# Patient Record
Sex: Male | Born: 1960 | Race: White | Hispanic: No | Smoking: Light tobacco smoker
Health system: Southern US, Community
[De-identification: ages and names within clinical notes are randomized; demographics above are authoritative.]

## PROBLEM LIST (undated history)

## (undated) DIAGNOSIS — E876 Hypokalemia: Secondary | ICD-10-CM

## (undated) DIAGNOSIS — K2270 Barrett's esophagus without dysplasia: Secondary | ICD-10-CM

## (undated) DIAGNOSIS — K2210 Ulcer of esophagus without bleeding: Secondary | ICD-10-CM

## (undated) DIAGNOSIS — E119 Type 2 diabetes mellitus without complications: Secondary | ICD-10-CM

## (undated) DIAGNOSIS — I10 Essential (primary) hypertension: Secondary | ICD-10-CM

## (undated) DIAGNOSIS — I82409 Acute embolism and thrombosis of unspecified deep veins of unspecified lower extremity: Secondary | ICD-10-CM

## (undated) DIAGNOSIS — F418 Other specified anxiety disorders: Secondary | ICD-10-CM

## (undated) DIAGNOSIS — J449 Chronic obstructive pulmonary disease, unspecified: Secondary | ICD-10-CM

## (undated) DIAGNOSIS — K219 Gastro-esophageal reflux disease without esophagitis: Secondary | ICD-10-CM

## (undated) DIAGNOSIS — M1990 Unspecified osteoarthritis, unspecified site: Secondary | ICD-10-CM

## (undated) DIAGNOSIS — F1910 Other psychoactive substance abuse, uncomplicated: Secondary | ICD-10-CM

## (undated) DIAGNOSIS — K449 Diaphragmatic hernia without obstruction or gangrene: Secondary | ICD-10-CM

## (undated) DIAGNOSIS — E871 Hypo-osmolality and hyponatremia: Secondary | ICD-10-CM

## (undated) DIAGNOSIS — G4730 Sleep apnea, unspecified: Secondary | ICD-10-CM

## (undated) DIAGNOSIS — J45909 Unspecified asthma, uncomplicated: Secondary | ICD-10-CM

## (undated) DIAGNOSIS — R569 Unspecified convulsions: Secondary | ICD-10-CM

## (undated) DIAGNOSIS — K635 Polyp of colon: Secondary | ICD-10-CM

## (undated) DIAGNOSIS — D500 Iron deficiency anemia secondary to blood loss (chronic): Secondary | ICD-10-CM

## (undated) HISTORY — PX: HIP SURGERY: SHX245

## (undated) HISTORY — PX: RHINOPLASTY: SUR1284

## (undated) HISTORY — DX: Hypokalemia: E87.6

## (undated) HISTORY — DX: Unspecified asthma, uncomplicated: J45.909

## (undated) HISTORY — PX: BACK SURGERY: SHX140

## (undated) HISTORY — DX: Sleep apnea, unspecified: G47.30

## (undated) HISTORY — DX: Acute embolism and thrombosis of unspecified deep veins of unspecified lower extremity: I82.409

## (undated) HISTORY — PX: BUNIONECTOMY: SHX129

## (undated) HISTORY — PX: NASAL SINUS SURGERY: SHX719

## (undated) HISTORY — DX: Polyp of colon: K63.5

## (undated) HISTORY — PX: FINGER SURGERY: SHX640

## (undated) HISTORY — DX: Gastro-esophageal reflux disease without esophagitis: K21.9

## (undated) HISTORY — PX: OTHER SURGICAL HISTORY: SHX169

## (undated) HISTORY — DX: Unspecified convulsions: R56.9

## (undated) HISTORY — PX: APPENDECTOMY: SHX54

## (undated) HISTORY — DX: Hypo-osmolality and hyponatremia: E87.1

---

## 2004-01-08 ENCOUNTER — Encounter: Admission: RE | Admit: 2004-01-08 | Discharge: 2004-02-07 | Payer: Self-pay | Admitting: Family Medicine

## 2004-02-09 ENCOUNTER — Ambulatory Visit (HOSPITAL_COMMUNITY): Admission: RE | Admit: 2004-02-09 | Discharge: 2004-02-10 | Payer: Self-pay | Admitting: Neurological Surgery

## 2004-03-17 ENCOUNTER — Encounter: Admission: RE | Admit: 2004-03-17 | Discharge: 2004-04-24 | Payer: Self-pay | Admitting: Neurological Surgery

## 2005-03-05 ENCOUNTER — Emergency Department (HOSPITAL_COMMUNITY): Admission: EM | Admit: 2005-03-05 | Discharge: 2005-03-05 | Payer: Self-pay | Admitting: Emergency Medicine

## 2006-04-18 ENCOUNTER — Observation Stay (HOSPITAL_COMMUNITY): Admission: EM | Admit: 2006-04-18 | Discharge: 2006-04-18 | Payer: Self-pay | Admitting: Emergency Medicine

## 2008-05-07 ENCOUNTER — Emergency Department (HOSPITAL_COMMUNITY): Admission: EM | Admit: 2008-05-07 | Discharge: 2008-05-07 | Payer: Self-pay | Admitting: Emergency Medicine

## 2009-05-06 ENCOUNTER — Encounter (INDEPENDENT_AMBULATORY_CARE_PROVIDER_SITE_OTHER): Payer: Self-pay | Admitting: *Deleted

## 2009-05-06 ENCOUNTER — Ambulatory Visit: Payer: Self-pay | Admitting: Internal Medicine

## 2009-05-06 ENCOUNTER — Inpatient Hospital Stay (HOSPITAL_COMMUNITY): Admission: EM | Admit: 2009-05-06 | Discharge: 2009-05-09 | Payer: Self-pay | Admitting: Emergency Medicine

## 2009-05-07 ENCOUNTER — Encounter (INDEPENDENT_AMBULATORY_CARE_PROVIDER_SITE_OTHER): Payer: Self-pay | Admitting: *Deleted

## 2009-06-05 DIAGNOSIS — K2270 Barrett's esophagus without dysplasia: Secondary | ICD-10-CM

## 2009-06-05 HISTORY — DX: Barrett's esophagus without dysplasia: K22.70

## 2009-06-11 ENCOUNTER — Ambulatory Visit: Payer: Self-pay | Admitting: Internal Medicine

## 2009-06-11 DIAGNOSIS — R933 Abnormal findings on diagnostic imaging of other parts of digestive tract: Secondary | ICD-10-CM

## 2009-06-11 DIAGNOSIS — K219 Gastro-esophageal reflux disease without esophagitis: Secondary | ICD-10-CM

## 2009-06-11 DIAGNOSIS — K625 Hemorrhage of anus and rectum: Secondary | ICD-10-CM

## 2009-06-14 ENCOUNTER — Telehealth: Payer: Self-pay | Admitting: Internal Medicine

## 2009-07-04 ENCOUNTER — Ambulatory Visit: Payer: Self-pay | Admitting: Internal Medicine

## 2009-07-10 ENCOUNTER — Encounter: Payer: Self-pay | Admitting: Internal Medicine

## 2009-09-10 ENCOUNTER — Telehealth: Payer: Self-pay | Admitting: Internal Medicine

## 2010-02-04 NOTE — Assessment & Plan Note (Signed)
Summary: Post hospital - esophageal issues   History of Present Illness Visit Type: Initial Visit Primary GI MD: Yancey Flemings MD Primary Provider: Antony Haste, MD Chief Complaint: Post hospital/ GERD, symptoms have improved History of Present Illness:   50 year old white male with a history of hypertension, asthma, alcohol and tobacco abuse, and anxiety. He was recently hospitalized (May 2 through May 09, 2009) with pneumonia as well as severe GERD symptoms. Problems with nausea vomiting and chest pain at the time of admission. Evaluation of the esophagus with CT scan and subsequent esophagram demonstrated esophagitis and probable confined submucosal tear. Was treated with antibiotics for his pneumonia as well as Carafate and Protonix for his GERD. He has changed his diet in a favorable fashion dramatically. His last 17 pounds. He had been off alcohol for several weeks though is using at modestly at present. He reports no problems with reflux on current medical therapy. No further chest pain. His dysphagia has improved. He has had reflux for over 10 years. As mentioned his bowel habits have been a bit looser than normal. Occasional rectal bleeding felt to be hemorrhoidal. No prior GI history or GI evaluations. He is establishing his general medical care with Dr. Cyndia Bent. Review of outside labs dated May 01, 2009 reveal normal CBC with hemoglobin 16.8 as well as normal comprehensive metabolic panel.Marland Kitchen   GI Review of Systems    Reports abdominal pain, acid reflux, chest pain, dysphagia with solids, loss of appetite, nausea, and  vomiting.     Location of  Abdominal pain: epigastric area.    Denies belching, bloating, dysphagia with liquids, heartburn, vomiting blood, weight loss, and  weight gain.      Reports change in bowel habits, constipation, hemorrhoids, light color stool, and  rectal bleeding.     Denies anal fissure, black tarry stools, diarrhea, diverticulosis, fecal incontinence, heme  positive stool, irritable bowel syndrome, jaundice, liver problems, and  rectal pain. Preventive Screening-Counseling & Management  Alcohol-Tobacco     Smoking Status: current      Drug Use:  no.      Current Medications (verified): 1)  Carafate 1 Gm Tabs (Sucralfate) .Marland Kitchen.. 1 By Mouth Four Times Per Day 2)  Celexa 20 Mg Tabs (Citalopram Hydrobromide) .Marland Kitchen.. 1 By Mouth Two Times A Day 3)  Coenzyme Q10 10 Mg Caps (Coenzyme Q10) .Marland Kitchen.. 1 By Mouth Once Daily 4)  Klonopin 0.5 Mg Tabs (Clonazepam) .Marland Kitchen.. 1-2 By Mouth Two Times A Day As Needed Anxiety 5)  Protonix 40 Mg Tbec (Pantoprazole Sodium) .Marland Kitchen.. 1 By Mouth Two Times A Day  Allergies (verified): No Known Drug Allergies  Past History:  Past Medical History: Tear of the distal Esophagus GERD Anxiety Alcohol abuse Tobacco abuse Chronic low back pain DVT Chronic Headaches Hypertension  Past Surgical History: Reviewed history from 06/07/2009 and no changes required. Appendectomy Sinus Surgery Diskectomy Lt. middle finger surgery  Family History: No FH of Colon Cancer: Family History of Diabetes: MGF  Social History: Occupation: Counsellor Patient currently smokes.  Alcohol Use - yes Daily Caffeine Use Illicit Drug Use - no Smoking Status:  current Drug Use:  no  Review of Systems       The patient complains of anxiety-new, back pain, change in vision, cough, coughing up blood, depression-new, fatigue, headaches-new, muscle pains/cramps, shortness of breath, sleeping problems, and urination - excessive.  The patient denies allergy/sinus, anemia, arthritis/joint pain, blood in urine, breast changes/lumps, confusion, fainting, fever, hearing problems, heart murmur, heart rhythm changes, itching,  menstrual pain, night sweats, nosebleeds, pregnancy symptoms, skin rash, sore throat, swelling of feet/legs, swollen lymph glands, thirst - excessive, urination changes/pain, urine leakage, vision changes, and voice change.    Vital  Signs:  Patient profile:   50 year old male Height:      75 inches Weight:      230.38 pounds BMI:     28.90 Pulse rate:   96 / minute Pulse rhythm:   regular BP sitting:   158 / 100  (left arm) Cuff size:   regular  Vitals Entered By: June McMurray CMA Duncan Dull) (June 11, 2009 9:26 AM)  Physical Exam  General:  Well developed, well nourished, no acute distress. Head:  Normocephalic and atraumatic. Eyes:  PERRLA, no icterus. Nose:  No deformity, discharge,  or lesions. Mouth:  No deformity or lesions,  Neck:  Supple; no masses or thyromegaly. Lungs:  Clear throughout to auscultation. Heart:  Regular rate and rhythm; no murmurs, rubs,  or bruits. Abdomen:  Soft, nontender and nondistended. No masses, hepatosplenomegaly or hernias noted. Normal bowel sounds. Msk:  Symmetrical with no gross deformities. Normal posture. Pulses:  Normal pulses noted. Extremities:  No clubbing, cyanosis, edema or deformities noted. Neurologic:  Alert and  oriented x4;  grossly normal neurologically. Skin:  Intact without significant lesions or rashes. Psych:  Alert and cooperative. Normal mood and affect.   Impression & Recommendations:  Problem # 1:  GERD (ICD-530.81) many year history of chronic GERD. Significant improvement of symptoms on PPI therapy. Also history of dysphagia intermittently, now better on PPI. Rule out Barrett's esophagus in this overweight white male who smokes and uses alcohol and has had reflux for greater than 10 years. Rule out peptic stricture.   Plan: #1. Stop Carafate #2. Continue Protonix b.i.d. for now #3. Schedule upper endoscopy. The nature of the procedure as well as the risks, benefits, and alternatives have been reviewed. He understood and agreed to proceed  Problem # 2:  ABNORMAL FINDINGS GI TRACT (ICD-793.4) recent CT and esophagram suggesting esophagitis as well as contain submucosal tear. Has had clinical resolution of GERD symptoms and chest pain. Appropriate  time to evaluate with endoscopy. Plans as outlined above.  Problem # 3:  RECTAL BLEEDING (ICD-569.3)  minor intermittent rectal bleeding likely due to benign anorectal pathology. Rule out neoplasia  Problem # 4:  SCREENING COLORECTAL-CANCER (ICD-V76.51) patient is of appropriate age for screening colonoscopy. After the esophageal issues are sorted out, we will address this. I have discussed this with him today. In addition to providing colorectal neoplasia screening, we can evaluate the minor intermittent rectal bleeding just to be safe  Patient Instructions: 1)  EGD [facility] 07/04/09 3:30 pm 2)  Upper Endoscopy brochure given.  3)  The medication list was reviewed and reconciled.  All changed / newly prescribed medications were explained.  A complete medication list was provided to the patient / caregiver. 4)  printed and given to pt. Milford Cage [facility]  June 11, 2009 10:15 AM 5)  Copy: Dr. Antony Haste

## 2010-02-04 NOTE — Letter (Signed)
Summary: Patient Notice-Barrett's [facility]  [address] [place], [place] [address]   Phone: [phone]  Fax: [phone]        July 10, 2009 MRN: [id number]    Jesse Hartman [facility] [address] [place], [place]  [address]    Dear Jesse Hartman,  I am pleased to inform you that the biopsies taken during your recent endoscopic examination did not show any evidence of cancer upon pathologic examination.  However, your biopsies indicate you have a condition known as Barrett's esophagus. While not cancer, it is pre-cancerous (can progress to cancer) and needs to be monitored with repeat endoscopic examination and biopsies.  Fortunately, it is quite rare that this develops into cancer, but careful monitoring of the condition along with taking your medication as prescribed is important in reducing the risk of developing cancer.  It is my recommendation that you have a repeat upper gastrointestinal endoscopic examination in ONE year.  Additional information/recommendations:  __Please call [phone] to schedule a return visit to further      evaluate your condition.  __Continue with treatment plan as outlined the day of your exam on your procedure report.  Please call us if you have or develop heartburn, reflux symptoms, any swallowing problems, or if you have questions about your condition that have not been fully answered at this time.  Sincerely,  Hilarie Fredrickson MD  This letter has been electronically signed by your physician.  Appended Document: Patient Notice-Barrett's Esopghagus letter mailed

## 2010-02-04 NOTE — Progress Notes (Signed)
Summary: refills  Phone Note Call from Patient Call back at Home Phone [phone]   Caller: Patient Call For: Marina Goodell Reason for Call: Refill Medication, Talk to Nurse Summary of Call: Patient needs refills for his Pantoprazole [facility] [address] Initial call taken by: Tawni Levy,  June 14, 2009 1:52 PM    Prescriptions: PROTONIX 40 MG TBEC (PANTOPRAZOLE SODIUM) 1 by mouth two times a day  #60 x 11   Entered by:   Milford Cage NCMA   Authorized by:   Hilarie Fredrickson MD   Signed by:   Milford Cage NCMA on 06/14/2009   Method used:   Electronically to        [id number] (retail)       [address]       [place], [place]  [address]       Ph: [phone] or [phone]       Fax: [phone]   RxID:   [id number]

## 2010-02-04 NOTE — Progress Notes (Signed)
Summary: Med Change  Phone Note Call from Patient Call back at Home Phone [phone]   Caller: Patient Call For: Dr Marina Goodell Reason for Call: Talk to Nurse Details for Reason: Med Change Summary of Call: Pt lost healthcare insurance and cannot afford Protonix. Pharmacist recommended Omeprazole OTC, pt has been taking 20.mg two times a day and wanted to check with Dr Marina Goodell if this was an ok subsitution. States he is asymptomatic at this time. Initial call taken by: Dwan Bolt,  September 10, 2009 3:15 PM  Follow-up for Phone Call        Pt. told ok to take Prilosec .Says he is between jobs and he is asymptomatic on this med. Follow-up by: Teryl Lucy RN,  September 10, 2009 3:43 PM  Additional Follow-up for Phone Call Additional follow up Details #1::        ok, but must stay on PPI daily given Barrett's Additional Follow-up by: Hilarie Fredrickson MD,  September 10, 2009 5:45 PM    Additional Follow-up for Phone Call Additional follow up Details #2::    Pt. contacted and he will remain on P.P.I. Follow-up by: Teryl Lucy RN,  September 11, 2009 10:18 AM

## 2010-02-04 NOTE — Procedures (Signed)
Summary: Upper Endoscopy  Patient: Jesse Hartman Note: All result statuses are Final unless otherwise noted.  Tests: (1) Upper Endoscopy (EGD)   EGD Upper Endoscopy       DONE     [facility]     [address]     [place], [place]  [address]           ENDOSCOPY PROCEDURE REPORT           PATIENT:  Jesse Hartman  MR#:  [id number]     BIRTHDATE:  1960-04-28, 49 yrs. old  GENDER:  male           ENDOSCOPIST:  Wilhemina Bonito. Eda Keys, MD     Referred by:  Office           PROCEDURE DATE:  07/04/2009     PROCEDURE:  EGD with biopsies     ASA CLASS:  Class II     INDICATIONS:  abnormal imaging, Chronic GERD           MEDICATIONS:   Fentanyl 100 mcg IV, Versed 10 mg IV, Benadryl 50     mg IV     TOPICAL ANESTHETIC:  Exactacain Spray           DESCRIPTION OF PROCEDURE:   After the risks benefits and     alternatives of the procedure were thoroughly explained, informed     consent was obtained.  The LB GIF-H180 [id number] endoscope was     introduced through the mouth and advanced to the second portion of     the duodenum, without limitations.  The instrument was slowly     withdrawn as the mucosa was fully examined.     <<PROCEDUREIMAGES>>           10cm segment of Barrett's esophagus was found in the distal     esophagus. Biopsies (4q q 2cm; T=20) taken.  The stomach was     entered and closely examined. The antrum, angularis, and lesser     curvature were well visualized, including a retroflexed view of     the cardia and fundus. The stomach wall was normally distensable.     The scope passed easily through the pylorus into the duodenum.     Nonerosive Duodenitis was found in the bulb of the duodenum. The     postbulbar duodenum was normal.   Retroflexed views revealed no     abnormalities.    The scope was then withdrawn from the patient     and the procedure completed.           COMPLICATIONS:  None           ENDOSCOPIC IMPRESSION:     1) Barrett's esophagus in the distal  esophagus     2) Normal stomach     3) Duodenitis in the bulb of duodenum     4) GERD           RECOMMENDATIONS:     1) Await biopsy results     2) REPEAT SURVEILLANCE EGD IN ONE YEAR IF NO DYSPLASIA     3) Continue PROTONIX TWICE DAILY     4) Call office next 2-3 days to schedule an office appointment     for 4-6 WEEKS           ______________________________     Wilhemina Bonito. Eda Keys, MD           CC:  The Patient, Gaetano Net  n.     eSIGNED:   Wilhemina Bonito. Eda Keys at 07/04/2009 04:11 PM           Candis Schatz, [id number]  Note: An exclamation mark (!) indicates a result that was not dispersed into the flowsheet. Document Creation Date: 07/04/2009 4:12 PM _______________________________________________________________________  (1) Order result status: Final Collection or observation date-time: 07/04/2009 16:02 Requested date-time:  Receipt date-time:  Reported date-time:  Referring Physician:   Ordering Physician: Fransico Setters [phone]) Specimen Source:  Source: Launa Grill Order Number: [id number] Lab site:   Appended Document: Upper Endoscopy recall ONE year/Barrett's     Procedures Next Due Date:    EGD: 06/2010

## 2010-02-04 NOTE — Letter (Signed)
Summary: New Patient letter  [facility]  [address] [place], [place] [address]   Phone: [phone]  Fax: [phone]       05/07/2009 MRN: [id number]  [facility] [address] [place], [place]  [address]  Dear Jesse Hartman,  Welcome to the Gastroenterology Division at [facility].    You are scheduled to see Dr.  Marina Goodell on 06-11-09 at 9:30a.m. on the 3rd floor at [facility], [address]  We ask that you try to arrive at our office 15 minutes prior to your appointment time to allow for check-in.  We would like you to complete the enclosed self-administered evaluation form prior to your visit and bring it with you on the day of your appointment.  We will review it with you.  Also, please bring a complete list of all your medications or, if you prefer, bring the medication bottles and we will list them.  Please bring your insurance card so that we may make a copy of it.  If your insurance requires a referral to see a specialist, please bring your referral form from your primary care physician.  Co-payments are due at the time of your visit and may be paid by cash, check or credit card.     Your office visit will consist of a consult with your physician (includes a physical exam), any laboratory testing he/she may order, scheduling of any necessary diagnostic testing (e.g. x-ray, ultrasound, CT-scan), and scheduling of a procedure (e.g. Endoscopy, Colonoscopy) if required.  Please allow enough time on your schedule to allow for any/all of these possibilities.    If you cannot keep your appointment, please call [phone] to cancel or reschedule prior to your appointment date.  This allows [place] the opportunity to schedule an appointment for another patient in need of care.  If you do not cancel or reschedule by 5 p.m. the business day prior to your appointment date, you will be charged a $50.00 late cancellation/no-show fee.    Thank you for choosing [facility] for your medical needs.  We appreciate the opportunity to care for you.  Please visit [place] at our website  to learn more about our practice.                     Sincerely,                                                             The Gastroenterology Division

## 2010-02-04 NOTE — Letter (Signed)
Summary: EGD Instructions  [facility]  [address] [place], [place] [address]   Phone: [phone]  Fax: [phone]       [facility]    06/17/1960    MRN: [id number]       Procedure Day /Date:THURSDAY, 07/04/09     Arrival Time: 2:30 PM     Procedure Time:3:30 PM     Location of Procedure:                    [facility] (4th Floor)   PREPARATION FOR ENDOSCOPY   On THURSDAY, 07/04/09 THE DAY OF THE PROCEDURE:  1.   No solid foods, milk or milk products are allowed after midnight the night before your procedure.  2.   Do not drink anything colored red or purple.  Avoid juices with pulp.  No orange juice.  3.  You may drink clear liquids until 1:30 PM, which is 2 hours before your procedure.                                                                                                CLEAR LIQUIDS INCLUDE: Water Jello Ice Popsicles Tea (sugar ok, no milk/cream) Powdered fruit flavored drinks Coffee (sugar ok, no milk/cream) Gatorade Juice: apple, white grape, white cranberry  Lemonade Clear bullion, consomm, broth Carbonated beverages (any kind) Strained chicken noodle soup Hard Candy   MEDICATION INSTRUCTIONS  Unless otherwise instructed, you should take regular prescription medications with a small sip of water as early as possible the morning of your procedure.           OTHER INSTRUCTIONS  You will need a responsible adult at least 49 years of age to accompany you and drive you home.   This person must remain in the waiting room during your procedure.  Wear loose fitting clothing that is easily removed.  Leave jewelry and other valuables at home.  However, you may wish to bring a book to read or an iPod/MP3 player to listen to music as you wait for your procedure to start.  Remove all body piercing jewelry and leave at home.  Total time from sign-in until discharge is approximately 2-3 hours.  You should go home  directly after your procedure and rest.  You can resume normal activities the day after your procedure.  The day of your procedure you should not:   Drive   Make legal decisions   Operate machinery   Drink alcohol   Return to work  You will receive specific instructions about eating, activities and medications before you leave.    The above instructions have been reviewed and explained to me by   _______________________    I fully understand and can verbalize these instructions _____________________________ Date _________

## 2010-03-25 LAB — CULTURE, BLOOD (ROUTINE X 2): Culture: NO GROWTH

## 2010-03-25 LAB — CBC
HCT: 44 % (ref 39.0–52.0)
Hemoglobin: 15.3 g/dL (ref 13.0–17.0)
MCHC: 34.9 g/dL (ref 30.0–36.0)
Platelets: 142 10*3/uL — ABNORMAL LOW (ref 150–400)
RBC: 4.11 MIL/uL — ABNORMAL LOW (ref 4.22–5.81)
RBC: 4.4 MIL/uL (ref 4.22–5.81)
RDW: 12.3 % (ref 11.5–15.5)
WBC: 11.4 10*3/uL — ABNORMAL HIGH (ref 4.0–10.5)
WBC: 9.4 10*3/uL (ref 4.0–10.5)

## 2010-03-25 LAB — DIFFERENTIAL
Basophils Absolute: 0 10*3/uL (ref 0.0–0.1)
Eosinophils Relative: 1 % (ref 0–5)
Lymphocytes Relative: 12 % (ref 12–46)
Lymphs Abs: 1.4 10*3/uL (ref 0.7–4.0)
Neutrophils Relative %: 80 % — ABNORMAL HIGH (ref 43–77)

## 2010-03-25 LAB — URINALYSIS, ROUTINE W REFLEX MICROSCOPIC
Bilirubin Urine: NEGATIVE
Glucose, UA: NEGATIVE mg/dL
Hgb urine dipstick: NEGATIVE
Ketones, ur: 15 mg/dL — AB
Leukocytes, UA: NEGATIVE
pH: 5.5 (ref 5.0–8.0)

## 2010-03-25 LAB — RAPID URINE DRUG SCREEN, HOSP PERFORMED
Benzodiazepines: POSITIVE — AB
Cocaine: NOT DETECTED

## 2010-03-25 LAB — COMPREHENSIVE METABOLIC PANEL
ALT: 31 U/L (ref 0–53)
AST: 33 U/L (ref 0–37)
Albumin: 3.8 g/dL (ref 3.5–5.2)
Calcium: 8.7 mg/dL (ref 8.4–10.5)
Chloride: 98 mEq/L (ref 96–112)
Creatinine, Ser: 0.8 mg/dL (ref 0.4–1.5)
GFR calc non Af Amer: >60 mL/min (ref >60)
Potassium: 3.6 mEq/L (ref 3.5–5.1)

## 2010-03-25 LAB — CARDIAC PANEL(CRET KIN+CKTOT+MB+TROPI)
CK, MB: 2.2 ng/mL (ref 0.3–4.0)
Relative Index: 2.4 (ref 0.0–2.5)
Relative Index: 2.6 — ABNORMAL HIGH (ref 0.0–2.5)
Relative Index: INVALID (ref 0.0–2.5)
Total CK: 143 U/L (ref 7–232)
Troponin I: 0.01 ng/mL (ref 0.00–0.06)
Troponin I: 0.02 ng/mL (ref 0.00–0.06)

## 2010-03-25 LAB — VITAMIN B12: Vitamin B-12: 472 pg/mL (ref 211–911)

## 2010-03-25 LAB — BASIC METABOLIC PANEL
CO2: 26 mEq/L (ref 19–32)
Chloride: 100 mEq/L (ref 96–112)
GFR calc Af Amer: >60 mL/min (ref >60)
Potassium: 3.8 mEq/L (ref 3.5–5.1)

## 2010-03-25 LAB — URINE MICROSCOPIC-ADD ON

## 2010-03-25 LAB — TSH: TSH: 1.085 u[IU]/mL (ref 0.350–4.500)

## 2010-03-25 LAB — TROPONIN I: Troponin I: 0.01 ng/mL (ref 0.00–0.06)

## 2010-03-25 LAB — FOLATE RBC: RBC Folate: 660 ng/mL — ABNORMAL HIGH (ref 180–600)

## 2010-03-25 LAB — CK TOTAL AND CKMB (NOT AT ARMC): Total CK: 195 U/L (ref 7–232)

## 2010-03-25 LAB — LIPASE, BLOOD: Lipase: 22 U/L (ref 11–59)

## 2010-05-23 NOTE — Op Note (Signed)
Jesse Hartman, Jesse Hartman                  ACCOUNT NO.:  [id number]   MEDICAL RECORD NO.:  [id number]          PATIENT TYPE:  OBV   LOCATION:  5731                         FACILITY:  [facility]   PHYSICIAN:  Dionne Ano. Gramig III, M.D.DATE OF BIRTH:  08/14/1960   DATE OF PROCEDURE:  04/18/2006  DATE OF DISCHARGE:                               OPERATIVE REPORT   PREOPERATIVE DIAGNOSIS:  Open left middle finger proximal  interphalangeal joint dislocation with volar plate injury and partial  interpositioning of the joint.   POSTOP DIAGNOSIS:  Open left middle finger proximal interphalangeal  joint dislocation with volar plate injury and partial interpositioning  of the joint.   PROCEDURE:  1. Incision and drainage open PIP joint dislocation, left middle      finger.  2. Open relocation left PIP joint about the middle finger.  3. Volar plate reconstruction and repair, left middle finger,      secondary to interposition (ligamentous repair, left PIP joint).  4. Stress radiography.   SURGEON:  Dionne Ano. Amanda Pea, M.D.   ASSISTANT:  None.   COMPLICATIONS:  None.   ANESTHESIA:  General.   TOURNIQUET TIME:  Less than 30 minutes.   INDICATIONS FOR THE PROCEDURE:  This patient is a 50 year old male whom  I was called about at 2:00 a.m. in regards to his open left middle  finger PIP dislocation.  I have discussed with the patient the upper  extremity predicament; and recommended surgical I&D, and reconstruction  as necessary.  The risks and benefits of bleeding, infection, and  anesthesia, damage to normal structures, and failure of surgery to  accomplish its intended goals of relieving symptoms and restoring  function were discussed with the patient at length.  With this in mind,  he desired to proceed.  All questions have been encouraged and answered  preoperatively.   OPERATIVE PROCEDURE:  The patient was placed in the supine position  under general anesthesia.  Arm was marked.  Permit  signed, he was  consented.  Preoperative antibiotics were previously given in the wrist  emergency room.  His tetanus status was up-to-date.  Operation commenced  with sterile prep and drape, then isolation of a sterile field about the  left upper extremity.  Once this was done; the patient then underwent a  very careful surgical scrub and incision along the volar aspect of his  middle finger was accomplished under 250 mm tourniquet control.  Dissection was carried down, the radial and ulnar neurovascular bundles  were identified and protected.  There was a slight neurapraxic injury to  the ulnar aspect of the ulnar digital nerve.  The radial digital nerve  appeared to be free from contusion.  I dissected down and noted a flexor  sheath disruption; and identified the FDP and FDS tendons, moved them  out of harm's way; and then identified the volar plate; and removed this  from harm's way in the joint.  I then irrigated with greater than 3-4  liters of saline, the volar PIP joint region.  This was excisional  debridement of an open PIP  dislocation.  This was aggressive I&D'd this  was an excisional debridement.   Following this relocation was accomplished without difficulty utilizing  manipulation under stress radiography.  The PIP joint was manipulated  nicely.  The volar plate was slightly interposed and did have to be  manipulated and reconstructed with orthopedic instruments/material.  The  patient tolerated this well without difficulty.   Stress radiography then revealed excellent concentric reduction, good  stability, __________ plate.  I then deflated the tourniquet, obtaining  hemostasis, and irrigated, once again, with copious amounts of saline.  I closed the wound with interrupted chromic and Prolene combination  suture.  He was placed in a sterile dressing, dorsal blocking, and  splint; and will be admitted for IV antibiotics.   At the time of first postop visit we will plan  for dorsal-blocking  splint application; and begin active range of motion of the fingers,  suture removal at 2 weeks' postop; and cautious wound observation given  the open nature of the injury, and soft tissue conditions.  He will be  admitted, of course, for IV antibiotics; at this juncture and pain  management.  It was a pleasure participating in his care.  He had good  refill, no complicating features; and was taken to recovery room stable  condition.  All sponge and instrument counts were correct.           ______________________________  Dionne Ano. Everlene Other, M.D.     Nash Mantis  D:  04/18/2006  T:  04/18/2006  Job:  [id number]

## 2010-05-23 NOTE — Op Note (Signed)
NAMEMITHRAN, STRIKE                  ACCOUNT NO.:  [id number]   MEDICAL RECORD NO.:  [id number]          PATIENT TYPE:  OIB   LOCATION:  3031                         FACILITY:  [facility]   PHYSICIAN:  Stefani Dama, M.D.  DATE OF BIRTH:  1960/05/20   DATE OF PROCEDURE:  02/09/2004  DATE OF DISCHARGE:  02/10/2004                                 OPERATIVE REPORT   PREOPERATIVE DIAGNOSES:  Herniated nucleus pulposus L5-S1, extraforaminal  with left L5 radiculopathy.   POSTOPERATIVE DIAGNOSES:  Herniated nucleus pulposus L5-S1, extraforaminal  with left L5 radiculopathy.   PROCEDURE:  L5-S1 left [employer] extraforaminal diskectomy with operating  microscope, microdissection technique.   SURGEON:  Dr. Barnett Abu.   FIRST ASSISTANT:  Dr. Delma Officer.   ANESTHESIA:  General endotracheal.   INDICATIONS:  Mr. Jesse Hartman is a 50 year old individual who has had severe back  and left lower extremity pain for about 5 weeks' time that has been  unrelenting and not getting better. In fact, he has been having increasing  weakness in his left lower extremity with a foot drop.  He has been advised  regarding surgical extirpation of a sizeable extraforaminal disk herniation.   PROCEDURE:  The patient was brought to the operating room, supine on the  stretcher. After smooth induction of general endotracheal anesthesia, he was  turned prone, the back was prepped with Hibiclens and alcohol and draped in  a sterile fashion. Fluoroscopic guidance was used to localize the angle of  the pars and transverse process on the left side at the L5-S1 space.  K-wire  was passed down to this region and the skin above this area was then  anesthetized.  A small vertical incision was created in is region and then  using a series of dilators and fluoroscopic guidance, the opening was  dilated to the 22 mm size. A 22 mm x 7 cm deep endoscopic cannula was then  placed into the depths of this wound and this was affixed to the  table with  a clamp.   Then the microscope was draped and brought into the field. The superficial  tissues over the extra-foraminal space were dissected clear with a monopolar  cautery and then a high-speed air drill was used to remove the outer edge of  the pars and the superficial portion of the L5-S1 facet complex. The lateral  facetectomy was performed of the superior complex to expose the underlying  fascia.  The fascia was opened and the superficial fat was identified in  this region and this was dissected free. The L5 nerve root was identified  and dissection underneath the L5 nerve root confirmed the positioning of the  disk space, both visually and radiographically. Then the tissue overlying  the disk space was taken up and it was noted be significantly bulged in the  posterolateral area, just outside the foramen. This was incised. Several  small fragments of disk were encountered in this region but what was mostly  encountered was a thickened, posterior longitudinal ligament.   The dissection was then carried out laterally. With careful  dissection in  this area, the L5 nerve root was continuously protected and on the lateral  aspect, a large fragment of disk was encountered. Once this was resected,  the L5 nerve root fell free and was noted to be much looser in the  extraforaminal space.  With this being performed, the space was explored  further. The disk space itself was evacuated of several fragments of  degenerated disk and ultimately hemostasis in the lateral recess was  obtained. The extraforaminal space was felt to be well decompressed.   During the procedure, at several times a cc of local anesthetic which was a  mixture of 0.5% Marcaine with 1% lidocaine with epinephrine was allowed to  drip over the nerve and bathe the nerve.  This was right in the region of  the dorsal root ganglion on that left side.  Prior to closure of the  procedure, 40 mg of Depo-Medrol along  with 0.5 cc of fentanyl was left in  the epidural space along the path of the L5 nerve root in the extraforaminal  space.  Then the microscope was withdrawn. The superficial fascia was closed  with two 3-0 Vicryl sutures in interrupted fashion, 3-0 Vicryl was used in  the subcuticular tissues and then ultimately, 10 cc of local anesthetic  mixture was injected into the paraspinous musculature posteriorly. The  patient tolerated the procedure well,  was returned to the recovery room in  stable condition. Blood loss for the procedure was estimated at less than 50  cc.      HJE/MEDQ  D:  02/09/2004  T:  02/10/2004  Job:  [id number]

## 2010-07-18 ENCOUNTER — Encounter: Payer: Self-pay | Admitting: Internal Medicine

## 2010-12-10 ENCOUNTER — Emergency Department (HOSPITAL_COMMUNITY): Payer: Self-pay

## 2010-12-10 ENCOUNTER — Encounter: Payer: Self-pay | Admitting: *Deleted

## 2010-12-10 ENCOUNTER — Emergency Department (HOSPITAL_COMMUNITY)
Admission: EM | Admit: 2010-12-10 | Discharge: 2010-12-10 | Disposition: A | Discharge status: Home / Self Care | Payer: Self-pay | Attending: Emergency Medicine | Admitting: Emergency Medicine

## 2010-12-10 DIAGNOSIS — X500XXA Overexertion from strenuous movement or load, initial encounter: Secondary | ICD-10-CM | POA: Insufficient documentation

## 2010-12-10 DIAGNOSIS — S93409A Sprain of unspecified ligament of unspecified ankle, initial encounter: Secondary | ICD-10-CM | POA: Insufficient documentation

## 2010-12-10 DIAGNOSIS — M25579 Pain in unspecified ankle and joints of unspecified foot: Secondary | ICD-10-CM | POA: Insufficient documentation

## 2010-12-10 DIAGNOSIS — S93401A Sprain of unspecified ligament of right ankle, initial encounter: Secondary | ICD-10-CM

## 2010-12-10 DIAGNOSIS — F17200 Nicotine dependence, unspecified, uncomplicated: Secondary | ICD-10-CM | POA: Insufficient documentation

## 2010-12-10 DIAGNOSIS — I10 Essential (primary) hypertension: Secondary | ICD-10-CM | POA: Insufficient documentation

## 2010-12-10 HISTORY — DX: Essential (primary) hypertension: I10

## 2010-12-10 HISTORY — DX: Unspecified osteoarthritis, unspecified site: M19.90

## 2010-12-10 MED ORDER — IBUPROFEN 800 MG PO TABS: 800.0000 mg | ORAL_TABLET | Freq: Three times a day (TID) | ORAL | Status: DC

## 2010-12-10 MED ORDER — HYDROCODONE-ACETAMINOPHEN 5-500 MG PO TABS: 1.0000 | ORAL_TABLET | Freq: Four times a day (QID) | ORAL | Status: DC | PRN

## 2010-12-10 MED ORDER — OXYCODONE-ACETAMINOPHEN 5-325 MG PO TABS
2.0000 | ORAL_TABLET | Freq: Once | ORAL | Status: AC
  Administered 2010-12-10: 2 via ORAL
  Filled 2010-12-10: qty 2

## 2010-12-10 NOTE — ED Notes (Signed)
Patient given discharge instructions, information, prescriptions, and diet order. Patient states that they adequately understand discharge information given and to return to ED if symptoms return or worsen.    Pt discharged with crutches and aso ankle brace. 2 Rx given

## 2010-12-10 NOTE — ED Notes (Signed)
Pt states that he tripped over a curb 1 month ago and states that he has had pain in his right ankle ever since. Patient states that he has taken ibuprofen and aleve for pain relief for the past month. Patient fainted at approximately 1430 today and his family that is with him states that is was for approximately 30 seconds. Patient states that he did not hit his head either time he fell. Right ankle with ice on it at this time, patient states that he has no feeling in his toes at this time and that this has been going on for 1 week. Extremity is warm with cap refill but patient states that he can not move his toes or ankle. Patients right ankle is swollen.

## 2010-12-10 NOTE — ED Provider Notes (Signed)
History     CSN: [id number] Arrival date & time: 12/10/2010  5:19 PM   First MD Initiated Contact with Patient 12/10/10 1902      Chief Complaint  Patient presents with  . Ankle Pain    (Consider location/radiation/quality/duration/timing/severity/associated sxs/prior treatment) HPI  Patient presents to ER complaining of right ankle injury and pain. Patient states the 3 weeks ago he tripped in a parking lot and twisted ankle causing pain, swelling and bruising that over the last 3 weeks has continued to causing him to limp due to pain but with resolution of bruising but ongoing swelling however today patient states he was pushing a cart and twisted his ankle once again causing severe increase in pain. Patient has not taken anything for pain PTA but states pain is aggravated by weight bearing, movement and touch. Denies additional injury. Pain radiated into lower leg and foot with walking.   Past Medical History  Diagnosis Date  . Hypertension   . Barrett's syndrome   . Arthritis     Past Surgical History  Procedure Date  . Back surgery   . Rhinoplasty   . Bunionectomy   . Appendectomy     History reviewed. No pertinent family history.  History  Substance Use Topics  . Smoking status: Current Everyday Smoker -- 0.5 packs/day for 30 years    Types: Cigarettes  . Smokeless tobacco: Not on file  . Alcohol Use: Yes      Review of Systems  All other systems reviewed and are negative.    Allergies  Review of patient's allergies indicates no known allergies.  Home Medications   Current Outpatient Rx  Name Route Sig Dispense Refill  . CITALOPRAM HYDROBROMIDE 10 MG PO TABS Oral Take 10 mg by mouth daily.      . IBUPROFEN 200 MG PO TABS Oral Take 400 mg by mouth every 6 (six) hours as needed. For pain.     Carma Leaven M PLUS PO TABS Oral Take 2 tablets by mouth daily.        BP 146/91  Pulse 100  Temp(Src) 98.8 F (37.1 C) (Oral)  Resp 18  SpO2 98%  Physical Exam   Nursing note and vitals reviewed. Constitutional: He is oriented to person, place, and time. He appears well-developed.  HENT:  Head: Normocephalic and atraumatic.  Eyes: Conjunctivae are normal.  Neck: Normal range of motion. Neck supple.  Cardiovascular: Normal rate.   Pulmonary/Chest: Effort normal.  Musculoskeletal: Normal range of motion. He exhibits tenderness.       TTP of entire ankle but severe TTP of right lateral ankle with soft tissue swelling but no deformity or bruising. No TTP of forefoot. Good pedal pulse and cap refill. No boney TTP of right lower leg.   Neurological: He is alert and oriented to person, place, and time.  Skin: Skin is warm and dry. No rash noted. No erythema. No pallor.    ED Course  Procedures (including critical care time)  PO percocet.  Labs Reviewed - No data to display Dg Ankle Complete Right  12/10/2010  *RADIOLOGY REPORT*  Clinical Data: Lateral ankle pain and swelling following twisting injury 3 weeks ago and today.  RIGHT ANKLE - COMPLETE 3+ VIEW  Comparison: 04/18/2006 radiographs.  Findings: There is moderate lateral soft tissue swelling.  No acute fracture or dislocation is demonstrated.  Best seen on the lateral view is coarse ossification posterior to the distal tibia. Some of this calcification projects over the syndesmosis  on the oblique view.  This was not demonstrated previously but does not have an acute appearance.  This is likely related to remote injury, possibly heterotopic ossification.  IMPRESSION: No acute osseous findings demonstrated. Ossification posterior to the distal tibia appears nonacute, probably related to remote injury.  See above discussion.  Original Report Authenticated By: Gerrianne Scale, M.D.     1. Sprain of ankle, right       MDM  No acute findings on xray of ankle with likely sprain of ankle. Patient to follow up with Tomasita Crumble who he has seen in past for recheck of ongoing pain. No TTP of  forefoot, lower leg or knee. RLE is neurovascularly intact.         Jenness Corner, [place] 12/10/10 2115

## 2010-12-11 NOTE — ED Provider Notes (Signed)
Medical screening examination/treatment/procedure(s) were performed by non-physician practitioner and as supervising physician I was immediately available for consultation/collaboration.   Nat Christen, MD 12/11/10 [phone]

## 2010-12-20 ENCOUNTER — Emergency Department (HOSPITAL_COMMUNITY)
Admission: EM | Admit: 2010-12-20 | Discharge: 2010-12-20 | Disposition: A | Discharge status: Home / Self Care | Payer: Self-pay | Attending: Emergency Medicine | Admitting: Emergency Medicine

## 2010-12-20 ENCOUNTER — Encounter (HOSPITAL_COMMUNITY): Payer: Self-pay

## 2010-12-20 ENCOUNTER — Emergency Department (HOSPITAL_COMMUNITY): Payer: Self-pay

## 2010-12-20 DIAGNOSIS — Z9889 Other specified postprocedural states: Secondary | ICD-10-CM | POA: Insufficient documentation

## 2010-12-20 DIAGNOSIS — F1010 Alcohol abuse, uncomplicated: Secondary | ICD-10-CM | POA: Insufficient documentation

## 2010-12-20 DIAGNOSIS — E860 Dehydration: Secondary | ICD-10-CM | POA: Insufficient documentation

## 2010-12-20 DIAGNOSIS — F10929 Alcohol use, unspecified with intoxication, unspecified: Secondary | ICD-10-CM

## 2010-12-20 DIAGNOSIS — R569 Unspecified convulsions: Secondary | ICD-10-CM | POA: Insufficient documentation

## 2010-12-20 DIAGNOSIS — R5381 Other malaise: Secondary | ICD-10-CM | POA: Insufficient documentation

## 2010-12-20 DIAGNOSIS — R51 Headache: Secondary | ICD-10-CM | POA: Insufficient documentation

## 2010-12-20 DIAGNOSIS — F17200 Nicotine dependence, unspecified, uncomplicated: Secondary | ICD-10-CM | POA: Insufficient documentation

## 2010-12-20 DIAGNOSIS — M129 Arthropathy, unspecified: Secondary | ICD-10-CM | POA: Insufficient documentation

## 2010-12-20 DIAGNOSIS — Z79899 Other long term (current) drug therapy: Secondary | ICD-10-CM | POA: Insufficient documentation

## 2010-12-20 DIAGNOSIS — I10 Essential (primary) hypertension: Secondary | ICD-10-CM | POA: Insufficient documentation

## 2010-12-20 DIAGNOSIS — K2270 Barrett's esophagus without dysplasia: Secondary | ICD-10-CM | POA: Insufficient documentation

## 2010-12-20 DIAGNOSIS — R55 Syncope and collapse: Secondary | ICD-10-CM

## 2010-12-20 LAB — COMPREHENSIVE METABOLIC PANEL
BUN: 7 mg/dL (ref 6–23)
CO2: 24 mEq/L (ref 19–32)
Calcium: 8.9 mg/dL (ref 8.4–10.5)
Chloride: 104 mEq/L (ref 96–112)
Creatinine, Ser: 0.7 mg/dL (ref 0.50–1.35)
GFR calc Af Amer: >90 mL/min (ref >90)
GFR calc non Af Amer: >90 mL/min (ref >90)
Glucose, Bld: 95 mg/dL (ref 70–99)
Total Bilirubin: 0.3 mg/dL (ref 0.3–1.2)

## 2010-12-20 LAB — CBC
HCT: 46.1 % (ref 39.0–52.0)
Hemoglobin: 16.6 g/dL (ref 13.0–17.0)
MCV: 95.1 fL (ref 78.0–100.0)
RBC: 4.85 MIL/uL (ref 4.22–5.81)
RDW: 12.5 % (ref 11.5–15.5)
WBC: 6.6 10*3/uL (ref 4.0–10.5)

## 2010-12-20 LAB — DIFFERENTIAL
Eosinophils Relative: 4 % (ref 0–5)
Lymphocytes Relative: 32 % (ref 12–46)
Lymphs Abs: 2.1 10*3/uL (ref 0.7–4.0)
Monocytes Absolute: 0.5 10*3/uL (ref 0.1–1.0)
Monocytes Relative: 7 % (ref 3–12)

## 2010-12-20 LAB — PROTIME-INR
INR: 0.94 (ref 0.00–1.49)
Prothrombin Time: 12.8 seconds (ref 11.6–15.2)

## 2010-12-20 MED ORDER — THIAMINE HCL 100 MG/ML IJ SOLN
100.0000 mg | Freq: Every day | INTRAMUSCULAR | Status: DC
  Administered 2010-12-20: 100 mg via INTRAVENOUS
  Filled 2010-12-20: qty 2

## 2010-12-20 MED ORDER — MORPHINE SULFATE 4 MG/ML IJ SOLN
4.0000 mg | Freq: Once | INTRAMUSCULAR | Status: AC
  Administered 2010-12-20: 4 mg via INTRAVENOUS
  Filled 2010-12-20: qty 1

## 2010-12-20 MED ORDER — TRAMADOL HCL 50 MG PO TABS: 50.0000 mg | ORAL_TABLET | Freq: Four times a day (QID) | ORAL | Status: AC | PRN

## 2010-12-20 MED ORDER — SODIUM CHLORIDE 0.9 % IV BOLUS (SEPSIS)
1000.0000 mL | Freq: Once | INTRAVENOUS | Status: AC
  Administered 2010-12-20: 1000 mL via INTRAVENOUS

## 2010-12-20 NOTE — ED Notes (Signed)
CBG 92 Rn notified [phone]

## 2010-12-20 NOTE — ED Notes (Signed)
MD at bedside. 

## 2010-12-20 NOTE — ED Notes (Signed)
Pt requesting something for headache, dr Lynelle Doctor made aware

## 2010-12-20 NOTE — ED Provider Notes (Signed)
History     CSN: [id number] Arrival date & time: 12/20/2010 10:58 AM   First MD Initiated Contact with Patient 12/20/10 1125      Chief Complaint  Patient presents with  . Seizures    (Consider location/radiation/quality/duration/timing/severity/associated sxs/prior treatment) HPI Patient presents to the emergency room after having possible seizure versus fainting spell.  The patient states he went outside to let his dog out when while smoking a cigarette he started to feel lightheaded and faint. Patient states he walked back inside and at that time continued to feel very lightheaded and then had a syncopal episode. Patient's friend was at the house and he reported that he had a seizure however the patient remembers coming to rather quickly with his friend pounding on his chest. He also remembers him telling his friend not to call 911. EMS arrived and they convinced him to actually come to the hospital. Patient states he is an alcoholic and was drinking heavily last night. He has no history of seizure disorders. There is no history of alcohol withdrawal seizures either. Patient complains of a headache now but no other symptoms.    l Past Medical History  Diagnosis Date  . Hypertension   . Barrett's syndrome   . Arthritis     Past Surgical History  Procedure Date  . Back surgery   . Rhinoplasty   . Bunionectomy   . Appendectomy     History reviewed. No pertinent family history.  History  Substance Use Topics  . Smoking status: Current Everyday Smoker -- 0.5 packs/day for 30 years    Types: Cigarettes  . Smokeless tobacco: Not on file  . Alcohol Use: Yes      Review of Systems  All other systems reviewed and are negative.    Allergies  Review of patient's allergies indicates no known allergies.  Home Medications   Current Outpatient Rx  Name Route Sig Dispense Refill  . CITALOPRAM HYDROBROMIDE 10 MG PO TABS Oral Take 10 mg by mouth daily.      . IBUPROFEN 200  MG PO TABS Oral Take 400 mg by mouth every 6 (six) hours as needed. For pain.     . IBUPROFEN 800 MG PO TABS Oral Take 800 mg by mouth every 8 (eight) hours as needed. For pain.     Carma Leaven M PLUS PO TABS Oral Take 2 tablets by mouth daily.        BP 144/105  Pulse 102  Temp(Src) 98.2 F (36.8 C) (Oral)  Resp 20  SpO2 99%  Physical Exam  Nursing note and vitals reviewed. Constitutional: He appears well-developed and well-nourished. No distress.  HENT:  Head: Normocephalic and atraumatic.  Right Ear: External ear normal.  Left Ear: External ear normal.  Eyes: Conjunctivae are normal. Right eye exhibits no discharge. Left eye exhibits no discharge. No scleral icterus.  Neck: Neck supple. No tracheal deviation present.  Cardiovascular: Normal rate, regular rhythm and intact distal pulses.   Pulmonary/Chest: Effort normal and breath sounds normal. No stridor. No respiratory distress. He has no wheezes. He has no rales.  Abdominal: Soft. Bowel sounds are normal. He exhibits no distension. There is no tenderness. There is no rebound and no guarding.  Musculoskeletal: He exhibits no edema and no tenderness.       Spine nontender, no tenderness in extremities, ankle brace noted on right ankle  Neurological: He is alert. He has normal strength. No sensory deficit. Cranial nerve deficit:  no gross defecits  noted. He exhibits normal muscle tone. He displays no seizure activity. Coordination normal.  Skin: Skin is warm and dry. No rash noted.  Psychiatric: He has a normal mood and affect.    ED Course  Procedures (including critical care time)  Date: 12/20/2010  Rate: 80  Rhythm: normal sinus rhythm  QRS Axis: normal  Intervals: normal  ST/T Wave abnormalities: normal  Conduction Disutrbances:none  Narrative Interpretation:   Old EKG Reviewed: No significant changes   Labs Reviewed  CBC - Abnormal; Notable for the following:    MCH 34.2 (*)    All other components within normal  limits  COMPREHENSIVE METABOLIC PANEL - Abnormal; Notable for the following:    Albumin 3.2 (*)    AST 82 (*)    ALT 71 (*)    All other components within normal limits  ETHANOL - Abnormal; Notable for the following:    Alcohol, Ethyl (B) 121 (*)    All other components within normal limits  DIFFERENTIAL  PROTIME-INR  GLUCOSE, CAPILLARY  POCT CBG MONITORING   Ct Head Wo Contrast  12/20/2010  *RADIOLOGY REPORT*  Clinical Data: Seizure  CT HEAD WITHOUT CONTRAST  Technique:  Contiguous axial images were obtained from the base of the skull through the vertex without contrast.  Comparison: None  Findings:  There is prominence of the sulci and ventricles consistent with brain atrophy.  The brain has a normal appearance without evidence for hemorrhage, infarction, hydrocephalus, or mass lesion.  There is no extra axial fluid collection.  The skull appears intact.  Retention cyst versus polyp in the right maxillary sinus.  IMPRESSION:  1.  No acute intracranial abnormalities.  Original Report Authenticated By: Rosealee Albee, M.D.   Diagnosis : Syncope, alcohol intoxication, dehydration   MDM  2:20 PM patient feels well at this point except for a mild headache. There is no evidence of hemorrhage or injury on the CT scan. I doubt subarachnoid hemorrhage as this is not the worse headache of his life, there was no thunderclap onset, and it seemed to occur after falling and hitting his head. It is unclear if the patient had a syncope versus seizure associated with his alcohol use however I favor the former. He did not have a postictal phase. Patient recalls the events. He did have a prodromal episode prior to the fainting spell. Patient had been drinking heavily and this may have been a factor. I discussed the results with the patient and encouraged him to decrease his alcohol consumption. He is to drink fluids like Gatorade today. He is to followup with his doctor next week and return to emergency room  if there is any recurrent episodes.        Celene Kras, MD 12/20/10 [phone]

## 2010-12-20 NOTE — ED Notes (Signed)
Pt had a witnessed seizure, pt denies having a seizure states that he fainted, per EMS pt c/o chest pain enroute and was given ASA 324mg  and 1 nitro

## 2011-08-01 ENCOUNTER — Inpatient Hospital Stay (HOSPITAL_COMMUNITY)
Admission: EM | Admit: 2011-08-01 | Discharge: 2011-08-03 | DRG: 392 | Disposition: A | Discharge status: Home / Self Care | Payer: Medicaid Other | Attending: Internal Medicine | Admitting: Internal Medicine

## 2011-08-01 ENCOUNTER — Emergency Department (HOSPITAL_COMMUNITY): Payer: Self-pay

## 2011-08-01 ENCOUNTER — Encounter (HOSPITAL_COMMUNITY): Payer: Self-pay | Admitting: *Deleted

## 2011-08-01 DIAGNOSIS — F17200 Nicotine dependence, unspecified, uncomplicated: Secondary | ICD-10-CM | POA: Diagnosis present

## 2011-08-01 DIAGNOSIS — F10229 Alcohol dependence with intoxication, unspecified: Secondary | ICD-10-CM | POA: Diagnosis present

## 2011-08-01 DIAGNOSIS — E876 Hypokalemia: Secondary | ICD-10-CM | POA: Diagnosis present

## 2011-08-01 DIAGNOSIS — K208 Other esophagitis without bleeding: Principal | ICD-10-CM | POA: Diagnosis present

## 2011-08-01 DIAGNOSIS — R5381 Other malaise: Secondary | ICD-10-CM | POA: Diagnosis present

## 2011-08-01 DIAGNOSIS — F1010 Alcohol abuse, uncomplicated: Secondary | ICD-10-CM | POA: Diagnosis present

## 2011-08-01 DIAGNOSIS — K209 Esophagitis, unspecified without bleeding: Secondary | ICD-10-CM

## 2011-08-01 DIAGNOSIS — K2270 Barrett's esophagus without dysplasia: Secondary | ICD-10-CM

## 2011-08-01 DIAGNOSIS — R1310 Dysphagia, unspecified: Secondary | ICD-10-CM

## 2011-08-01 DIAGNOSIS — K219 Gastro-esophageal reflux disease without esophagitis: Secondary | ICD-10-CM

## 2011-08-01 DIAGNOSIS — R933 Abnormal findings on diagnostic imaging of other parts of digestive tract: Secondary | ICD-10-CM

## 2011-08-01 DIAGNOSIS — I959 Hypotension, unspecified: Secondary | ICD-10-CM

## 2011-08-01 DIAGNOSIS — E871 Hypo-osmolality and hyponatremia: Secondary | ICD-10-CM | POA: Diagnosis present

## 2011-08-01 DIAGNOSIS — K920 Hematemesis: Secondary | ICD-10-CM

## 2011-08-01 DIAGNOSIS — K625 Hemorrhage of anus and rectum: Secondary | ICD-10-CM

## 2011-08-01 DIAGNOSIS — R4182 Altered mental status, unspecified: Secondary | ICD-10-CM

## 2011-08-01 DIAGNOSIS — K2920 Alcoholic gastritis without bleeding: Secondary | ICD-10-CM

## 2011-08-01 DIAGNOSIS — K2980 Duodenitis without bleeding: Secondary | ICD-10-CM | POA: Diagnosis present

## 2011-08-01 DIAGNOSIS — F10929 Alcohol use, unspecified with intoxication, unspecified: Secondary | ICD-10-CM | POA: Diagnosis present

## 2011-08-01 LAB — URINALYSIS, ROUTINE W REFLEX MICROSCOPIC
Glucose, UA: NEGATIVE mg/dL
Ketones, ur: NEGATIVE mg/dL
Nitrite: NEGATIVE
Protein, ur: NEGATIVE mg/dL
Urobilinogen, UA: 0.2 mg/dL (ref 0.0–1.0)

## 2011-08-01 LAB — BLOOD GAS, ARTERIAL
Drawn by: 34779
FIO2: 0.21 %
pCO2 arterial: 40.8 mmHg (ref 35.0–45.0)
pH, Arterial: 7.33 — ABNORMAL LOW (ref 7.350–7.450)
pO2, Arterial: 86.5 mmHg (ref 80.0–100.0)

## 2011-08-01 LAB — HEMOGLOBIN AND HEMATOCRIT, BLOOD
HCT: 40.1 % (ref 39.0–52.0)
Hemoglobin: 15.3 g/dL (ref 13.0–17.0)

## 2011-08-01 LAB — COMPREHENSIVE METABOLIC PANEL
Albumin: 3.2 g/dL — ABNORMAL LOW (ref 3.5–5.2)
Alkaline Phosphatase: 57 U/L (ref 39–117)
BUN: 10 mg/dL (ref 6–23)
Calcium: 8.4 mg/dL (ref 8.4–10.5)
GFR calc Af Amer: 90 mL/min — ABNORMAL LOW (ref >90)
Glucose, Bld: 171 mg/dL — ABNORMAL HIGH (ref 70–99)
Potassium: 3 mEq/L — ABNORMAL LOW (ref 3.5–5.1)
Sodium: 132 mEq/L — ABNORMAL LOW (ref 135–145)
Total Protein: 5.8 g/dL — ABNORMAL LOW (ref 6.0–8.3)

## 2011-08-01 LAB — CBC
Hemoglobin: 15.5 g/dL (ref 13.0–17.0)
MCH: 33.5 pg (ref 26.0–34.0)
MCV: 93.3 fL (ref 78.0–100.0)
RBC: 4.63 MIL/uL (ref 4.22–5.81)
WBC: 10.5 10*3/uL (ref 4.0–10.5)

## 2011-08-01 LAB — MRSA PCR SCREENING: MRSA by PCR: NEGATIVE

## 2011-08-01 LAB — CBC WITH DIFFERENTIAL/PLATELET
Basophils Relative: 0 % (ref 0–1)
Eosinophils Absolute: 0.1 10*3/uL (ref 0.0–0.7)
Eosinophils Relative: 1 % (ref 0–5)
Lymphs Abs: 1.3 10*3/uL (ref 0.7–4.0)
MCH: 33.5 pg (ref 26.0–34.0)
MCHC: 36.3 g/dL — ABNORMAL HIGH (ref 30.0–36.0)
MCV: 92.3 fL (ref 78.0–100.0)
Monocytes Relative: 6 % (ref 3–12)
Neutrophils Relative %: 79 % — ABNORMAL HIGH (ref 43–77)
Platelets: 163 10*3/uL (ref 150–400)

## 2011-08-01 LAB — RAPID URINE DRUG SCREEN, HOSP PERFORMED
Amphetamines: NOT DETECTED
Opiates: NOT DETECTED

## 2011-08-01 LAB — SAMPLE TO BLOOD BANK

## 2011-08-01 LAB — URINE MICROSCOPIC-ADD ON

## 2011-08-01 LAB — MAGNESIUM: Magnesium: 2 mg/dL (ref 1.5–2.5)

## 2011-08-01 LAB — ETHANOL: Alcohol, Ethyl (B): 189 mg/dL — ABNORMAL HIGH (ref 0–11)

## 2011-08-01 MED ORDER — LORAZEPAM 2 MG/ML IJ SOLN
2.0000 mg | Freq: Once | INTRAMUSCULAR | Status: AC
  Administered 2011-08-01: 2 mg via INTRAVENOUS

## 2011-08-01 MED ORDER — SODIUM CHLORIDE 0.9 % IV SOLN
INTRAVENOUS | Status: DC
  Administered 2011-08-02: 75 mL via INTRAVENOUS
  Administered 2011-08-02: 08:00:00 via INTRAVENOUS

## 2011-08-01 MED ORDER — LORAZEPAM 2 MG/ML IJ SOLN
2.0000 mg | INTRAMUSCULAR | Status: DC | PRN
  Filled 2011-08-01: qty 1

## 2011-08-01 MED ORDER — CEPASTAT 14.5 MG MT LOZG
1.0000 | LOZENGE | OROMUCOSAL | Status: DC | PRN
  Administered 2011-08-01: 1 via BUCCAL
  Filled 2011-08-01: qty 18

## 2011-08-01 MED ORDER — SODIUM CHLORIDE 0.9 % IV SOLN
80.0000 mg | Freq: Once | INTRAVENOUS | Status: DC
  Filled 2011-08-01 (×2): qty 80

## 2011-08-01 MED ORDER — SODIUM CHLORIDE 0.9 % IV SOLN
8.0000 mg/h | INTRAVENOUS | Status: DC
  Filled 2011-08-01: qty 80

## 2011-08-01 MED ORDER — PANTOPRAZOLE SODIUM 40 MG IV SOLR
40.0000 mg | Freq: Two times a day (BID) | INTRAVENOUS | Status: DC
  Administered 2011-08-01 – 2011-08-02 (×3): 40 mg via INTRAVENOUS
  Filled 2011-08-01 (×3): qty 40

## 2011-08-01 MED ORDER — PANTOPRAZOLE SODIUM 40 MG IV SOLR
40.0000 mg | Freq: Once | INTRAVENOUS | Status: AC
  Administered 2011-08-01: 40 mg via INTRAVENOUS
  Filled 2011-08-01: qty 40

## 2011-08-01 MED ORDER — LORAZEPAM 2 MG/ML IJ SOLN
1.0000 mg | Freq: Four times a day (QID) | INTRAMUSCULAR | Status: DC | PRN
  Administered 2011-08-02: 1 mg via INTRAVENOUS
  Filled 2011-08-01: qty 1

## 2011-08-01 MED ORDER — ONDANSETRON HCL 4 MG/2ML IJ SOLN: 4.0000 mg | Freq: Three times a day (TID) | INTRAMUSCULAR | Status: AC | PRN

## 2011-08-01 MED ORDER — POTASSIUM CHLORIDE 10 MEQ/100ML IV SOLN
10.0000 meq | INTRAVENOUS | Status: AC
  Administered 2011-08-01 (×2): 10 meq via INTRAVENOUS
  Filled 2011-08-01 (×2): qty 100

## 2011-08-01 MED ORDER — POTASSIUM CHLORIDE 10 MEQ/100ML IV SOLN: 10.0000 meq | INTRAVENOUS | Status: DC

## 2011-08-01 MED ORDER — LORAZEPAM 2 MG/ML IJ SOLN
0.0000 mg | Freq: Two times a day (BID) | INTRAMUSCULAR | Status: DC
Start: 2011-08-03 — End: 2011-08-03
  Administered 2011-08-03: 1 mg via INTRAVENOUS

## 2011-08-01 MED ORDER — ONDANSETRON HCL 4 MG PO TABS: 4.0000 mg | ORAL_TABLET | Freq: Four times a day (QID) | ORAL | Status: DC | PRN

## 2011-08-01 MED ORDER — SODIUM CHLORIDE 0.9 % IV SOLN
1000.0000 mL | Freq: Once | INTRAVENOUS | Status: AC
  Administered 2011-08-01: 1000 mL via INTRAVENOUS

## 2011-08-01 MED ORDER — THIAMINE HCL 100 MG/ML IJ SOLN: Freq: Once | INTRAVENOUS | Status: DC

## 2011-08-01 MED ORDER — LORAZEPAM 2 MG/ML IJ SOLN
0.0000 mg | Freq: Four times a day (QID) | INTRAMUSCULAR | Status: AC
  Administered 2011-08-01: 1 mg via INTRAVENOUS
  Administered 2011-08-01: 2 mg via INTRAVENOUS
  Administered 2011-08-01: 1 mg via INTRAVENOUS
  Administered 2011-08-03: 2 mg via INTRAVENOUS
  Filled 2011-08-01 (×4): qty 1

## 2011-08-01 MED ORDER — LORAZEPAM 2 MG/ML IJ SOLN
INTRAMUSCULAR | Status: AC
  Filled 2011-08-01: qty 1

## 2011-08-01 MED ORDER — ONDANSETRON HCL 4 MG/2ML IJ SOLN: 4.0000 mg | Freq: Four times a day (QID) | INTRAMUSCULAR | Status: DC | PRN

## 2011-08-01 MED ORDER — THIAMINE HCL 100 MG/ML IJ SOLN
Freq: Once | INTRAVENOUS | Status: AC
  Administered 2011-08-01: 12:00:00 via INTRAVENOUS
  Filled 2011-08-01: qty 1000

## 2011-08-01 MED ORDER — LORAZEPAM 1 MG PO TABS
1.0000 mg | ORAL_TABLET | Freq: Four times a day (QID) | ORAL | Status: DC | PRN
Start: 2011-08-01 — End: 2011-08-03
  Administered 2011-08-02 (×3): 1 mg via ORAL
  Filled 2011-08-01 (×3): qty 1

## 2011-08-01 MED ORDER — SODIUM CHLORIDE 0.9 % IV SOLN
1000.0000 mL | INTRAVENOUS | Status: DC
  Administered 2011-08-01: 1000 mL via INTRAVENOUS

## 2011-08-01 NOTE — ED Notes (Signed)
Patient is sleeping and unable to answer personal questions.

## 2011-08-01 NOTE — ED Provider Notes (Signed)
History     CSN: [id number]  Arrival date & time 08/01/11  0340   First MD Initiated Contact with Patient 08/01/11 [phone]      Chief Complaint  Patient presents with  . Alcohol Intoxication   Level V caveat for altered mental status.  (Consider location/radiation/quality/duration/timing/severity/associated sxs/prior treatment) HPI Patient has history of alcoholism and drinking heavily. His daughter reported he been drinking tonight however she was concerned when he did not get up" stagger to the bedroom". She called EMS and they brought the patient to the hospital. Patient only responds to deep sternal rub.  PCP and none  Past Medical History  Diagnosis Date  . Hypertension   . Barrett's syndrome   . Arthritis     Past Surgical History  Procedure Date  . Back surgery   . Rhinoplasty   . Bunionectomy   . Appendectomy     History reviewed. No pertinent family history.  History  Substance Use Topics  . Smoking status: Current Everyday Smoker -- 0.5 packs/day for 30 years    Types: Cigarettes  . Smokeless tobacco: Not on file  . Alcohol Use: Yes   Lives with daughter   Review of Systems  Unable to perform ROS: Mental status change    Allergies  Review of patient's allergies indicates no known allergies.  Home Medications   Current Outpatient Rx  Name Route Sig Dispense Refill  . CITALOPRAM HYDROBROMIDE 10 MG PO TABS Oral Take 10 mg by mouth daily.      . IBUPROFEN 200 MG PO TABS Oral Take 400 mg by mouth every 6 (six) hours as needed. For pain.     Carma Leaven M PLUS PO TABS Oral Take 2 tablets by mouth daily.        BP 89/44  Pulse 60  Temp 97.8 F (36.6 C) (Oral)  Resp 23  SpO2 92%  Vital signs normal except hypotension   Physical Exam  Nursing note and vitals reviewed. Constitutional: He appears well-developed and well-nourished.  Non-toxic appearance. He does not appear ill. No distress.       Sleeping, does not respond to verbal commands  HENT:   Head: Normocephalic and atraumatic.  Right Ear: External ear normal.  Left Ear: External ear normal.  Nose: Nose normal. No mucosal edema or rhinorrhea.  Mouth/Throat: Oropharynx is clear and moist and mucous membranes are normal. No dental abscesses or uvula swelling.  Eyes: Conjunctivae and EOM are normal. Pupils are equal, round, and reactive to light.  Neck: Normal range of motion and full passive range of motion without pain. Neck supple.  Cardiovascular: Normal rate, regular rhythm and normal heart sounds.  Exam reveals no gallop and no friction rub.   No murmur heard. Pulmonary/Chest: Effort normal and breath sounds normal. No respiratory distress. He has no wheezes. He has no rhonchi. He has no rales. He exhibits no tenderness and no crepitus.  Abdominal: Soft. Normal appearance and bowel sounds are normal. He exhibits no distension. There is no tenderness. There is no rebound and no guarding.  Musculoskeletal: Normal range of motion. He exhibits no edema and no tenderness.       Moves all extremities well.   Neurological: He has normal strength.       Unable to assess, patient's snoring  Skin: Skin is warm, dry and intact. No rash noted. No erythema. No pallor.       No visible trauma  Psychiatric: His speech is normal. His mood appears not  anxious.       Sleeping    ED Course  Procedures (including critical care time)   Medications  0.9 %  sodium chloride infusion (1000 mL Intravenous New Bag/Given 08/01/11 0500)    Followed by  0.9 %  sodium chloride infusion (not administered)    Followed by  0.9 %  sodium chloride infusion (not administered)  potassium chloride 10 mEq in 100 mL IVPB (10 mEq Intravenous Given 08/01/11 0620)  pantoprazole (PROTONIX) injection 40 mg (not administered)  LORazepam (ATIVAN) injection 2 mg (2 mg Intravenous Given 08/01/11 0618)     Patient given IV fluids and his hypotension resolved.  Patient started on treatment for his hypokalemia with  IV potassium.  While patient was in the CT scanner he abruptly sat up and vomited a large amount of fluid that appeared to be bloody/coffee grounds. Patient is now more alert yelling however he cannot be reasoned with. He continues to state he needs to urinate with a Foley in place. Rectal is done by myself for brown stool that was sent to the lab.  Review of prior records show patient has been seen by GI at [facility] for GERD.  07:52 Dr Benjamine Mola admit to step down, team 5  Results for orders placed during the hospital encounter of 08/01/11  CBC WITH DIFFERENTIAL      Component Value Range   WBC 9.7  4.0 - 10.5 K/uL   RBC 4.39  4.22 - 5.81 MIL/uL   Hemoglobin 14.7  13.0 - 17.0 g/dL   HCT 96.0  45.4 - 09.8 %   MCV 92.3  78.0 - 100.0 fL   MCH 33.5  26.0 - 34.0 pg   MCHC 36.3 (*) 30.0 - 36.0 g/dL   RDW 11.9  14.7 - 82.9 %   Platelets 163  150 - 400 K/uL   Neutrophils Relative 79 (*) 43 - 77 %   Neutro Abs 7.7  1.7 - 7.7 K/uL   Lymphocytes Relative 14  12 - 46 %   Lymphs Abs 1.3  0.7 - 4.0 K/uL   Monocytes Relative 6  3 - 12 %   Monocytes Absolute 0.6  0.1 - 1.0 K/uL   Eosinophils Relative 1  0 - 5 %   Eosinophils Absolute 0.1  0.0 - 0.7 K/uL   Basophils Relative 0  0 - 1 %   Basophils Absolute 0.0  0.0 - 0.1 K/uL  COMPREHENSIVE METABOLIC PANEL      Component Value Range   Sodium 132 (*) 135 - 145 mEq/L   Potassium 3.0 (*) 3.5 - 5.1 mEq/L   Chloride 97  96 - 112 mEq/L   CO2 21  19 - 32 mEq/L   Glucose, Bld 171 (*) 70 - 99 mg/dL   BUN 10  6 - 23 mg/dL   Creatinine, Ser 5.62  0.50 - 1.35 mg/dL   Calcium 8.4  8.4 - 13.0 mg/dL   Total Protein 5.8 (*) 6.0 - 8.3 g/dL   Albumin 3.2 (*) 3.5 - 5.2 g/dL   AST 22  0 - 37 U/L   ALT 29  0 - 53 U/L   Alkaline Phosphatase 57  39 - 117 U/L   Total Bilirubin 0.2 (*) 0.3 - 1.2 mg/dL   GFR calc non Af Amer 77 (*) >90 mL/min   GFR calc Af Amer 90 (*) >90 mL/min  ETHANOL      Component Value Range   Alcohol, Ethyl (B) 189 (*) 0 -  11 mg/dL  BLOOD  GAS, ARTERIAL      Component Value Range   FIO2 0.21     pH, Arterial 7.330 (*) 7.350 - 7.450   pCO2 arterial 40.8  35.0 - 45.0 mmHg   pO2, Arterial 86.5  80.0 - 100.0 mmHg   Bicarbonate 21.0  20.0 - 24.0 mEq/L   TCO2 18.8  0 - 100 mmol/L   Acid-base deficit 4.3 (*) 0.0 - 2.0 mmol/L   O2 Saturation 95.7     Patient temperature 97.8     Collection site RIGHT RADIAL     Drawn by [phone]     Sample type ARTERIAL     Allens test (pass/fail) PASS  PASS  URINALYSIS, ROUTINE W REFLEX MICROSCOPIC      Component Value Range   Color, Urine YELLOW  YELLOW   APPearance CLOUDY (*) CLEAR   Specific Gravity, Urine 1.014  1.005 - 1.030   pH 5.0  5.0 - 8.0   Glucose, UA NEGATIVE  NEGATIVE mg/dL   Hgb urine dipstick NEGATIVE  NEGATIVE   Bilirubin Urine NEGATIVE  NEGATIVE   Ketones, ur NEGATIVE  NEGATIVE mg/dL   Protein, ur NEGATIVE  NEGATIVE mg/dL   Urobilinogen, UA 0.2  0.0 - 1.0 mg/dL   Nitrite NEGATIVE  NEGATIVE   Leukocytes, UA TRACE (*) NEGATIVE  URINE RAPID DRUG SCREEN (HOSP PERFORMED)      Component Value Range   Opiates NONE DETECTED  NONE DETECTED   Cocaine NONE DETECTED  NONE DETECTED   Benzodiazepines NONE DETECTED  NONE DETECTED   Amphetamines NONE DETECTED  NONE DETECTED   Tetrahydrocannabinol NONE DETECTED  NONE DETECTED   Barbiturates NONE DETECTED  NONE DETECTED  URINE MICROSCOPIC-ADD ON      Component Value Range   Squamous Epithelial / LPF RARE  RARE   WBC, UA 0-2  <3 WBC/hpf   Urine-Other AMORPHOUS URATES/PHOSPHATES    OCCULT BLOOD GASTRIC / DUODENUM      Component Value Range   pH, Gastric 4     Occult Blood, Gastric POSITIVE (*) NEGATIVE    Laboratory interpretation all normal except hypokalemia, alcohol intoxication   Dg Chest 1 View  08/01/2011  *RADIOLOGY REPORT*  Clinical Data: Hypertension, smoker, alcohol intoxication.  CHEST - 1 VIEW  Comparison: 05/09/2009  Findings: Degraded by hypoaeration, patient body habitus, and portable technique.  Cardiomediastinal  contours are likely unchanged allowing for differences in technique/respiratory effort. Mild retrocardiac scarring or atelectasis.  Otherwise, no definite focal consolidation, pleural effusion, or pneumothorax. Left clavicle deformity again noted.  No definite acute osseous finding.  IMPRESSION: Within limitations described above.  No definite radiographic evidence of acute cardiopulmonary process.  Original Report Authenticated By: Waneta Martins, M.D.   Ct Head Wo Contrast  08/01/2011  *RADIOLOGY REPORT*  Clinical Data: Alcohol intoxication and uncooperative.  CT HEAD WITHOUT CONTRAST  Technique:  Contiguous axial images were obtained from the base of the skull through the vertex without contrast.  Comparison: 12/20/2010  Findings: The images were repeated multiple times due to excessive patient motion.  There is no gross hemorrhage, mass lesion, midline shift, hydrocephalus or large infarct.  There is mucosal disease in the right maxillary sinus and fluid within the mastoid air cells. Mastoid air fluid was present on the prior examination.  There is mucosal disease in the ethmoid air cells.  Limited evaluation of the upper cervical spine due to motion.  IMPRESSION: This study is limited due to excessive patient motion.  No  gross intracranial abnormality.  Original Report Authenticated By: Richarda Overlie, M.D.      Date: 08/01/2011  Rate: 57  Rhythm: sinus bradycardia  QRS Axis: normal  Intervals: normal  ST/T Wave abnormalities: normal  Conduction Disutrbances:nonspecific intraventricular conduction delay and RVH  Narrative Interpretation:   Old EKG Reviewed: unchanged from 05/06/2009     1. Alcohol intoxication   2. Altered mental status   3. Hematemesis   4. Alcoholic gastritis   5. Hypotension   6. Hypokalemia    Plan admission  Devoria Albe, MD, FACEP   CRITICAL CARE Performed by: Devoria Albe L   Total critical care time: 32 min  Critical care time was exclusive of separately  billable procedures and treating other patients.  Critical care was necessary to treat or prevent imminent or life-threatening deterioration.  Critical care was time spent personally by me on the following activities: development of treatment plan with patient and/or surrogate as well as nursing, discussions with consultants, evaluation of patient's response to treatment, examination of patient, obtaining history from patient or surrogate, ordering and performing treatments and interventions, ordering and review of laboratory studies, ordering and review of radiographic studies, pulse oximetry and re-evaluation of patient's condition.  MDM          Ward Givens, MD 08/01/11 [phone]

## 2011-08-01 NOTE — ED Provider Notes (Signed)
Patient placed in soft wrist restraints for agitation and protection of medical devices.  Bed available in ICU.  To be transported shortly.  Cyndra Numbers, MD 08/01/11 [phone]

## 2011-08-01 NOTE — Consult Note (Signed)
[facility] Consult: 10:52 AM 08/01/2011   Referring Provider: Marlin Canary md Primary Care Physician:  Dr Cyndia Bent mentioned on previous notes.  Primary Gastroenterologist:  Dr. Marina Goodell, seen only once.    Reason for Consultation:  CG emesis  HPI: Jesse Hartman is a 51 y.o. male. CT scan in 2011 with esophagitis and probable contained submucosal tear.  EGD in 06/2009 with Barrett's and duodenitis.  Has not yet returned for follow up EGD.  He is an alcoholic and drinks heavily.  Brought by family to ED when he could not get up.  Exam in ED with response only to sternal rub. LFts normal as is head CT.  CG emesis reported.  Later required sedatives and physical restraint for agitation. No meds on the out pt med list.  Reported he is not taking his GI meds.  Nurse says he was c/o sore throat and esophageal burning earlier.  No melena reported     ROS Requiring soft wrist restraints in ED for agitation.  Unable to do ROS as pt is now obtunded.   Past Medical History  Diagnosis Date  . Hypertension   . Barrett's syndrome   . Arthritis     Past Surgical History  Procedure Date  . Back surgery   . Rhinoplasty   . Bunionectomy   . Appendectomy     Prior to Admission medications   Not on File    Scheduled Meds:    . sodium chloride  1,000 mL Intravenous Once   Followed by  . sodium chloride  1,000 mL Intravenous Once  . LORazepam  0-4 mg Intravenous Q6H   Followed by  . LORazepam  0-4 mg Intravenous Q12H  . LORazepam  2 mg Intravenous Once  . pantoprazole (PROTONIX) IV  80 mg Intravenous Once  . pantoprazole (PROTONIX) IV  40 mg Intravenous Once  . potassium chloride  10 mEq Intravenous Q1 Hr x 2  . potassium chloride  10 mEq Intravenous Q1 Hr x 2  . banana bag IV fluid 1000 mL   Intravenous Once   Infusions:    . pantoprozole (PROTONIX) infusion     PRN Meds: LORazepam, LORazepam   Allergies as of 08/01/2011  . (No  Known Allergies)    History reviewed. No pertinent family history.  History   Social History  . Marital Status: Divorced    Spouse Name: N/A    Number of Children: N/A  . Years of Education: N/A   Occupational History  . Not on file.   Social History Main Topics  . Smoking status: Current Everyday Smoker -- 0.5 packs/day for 30 years    Types: Cigarettes  . Smokeless tobacco: Not on file  . Alcohol Use: Yes  . Drug Use:   . Sexually Active: No   Other Topics Concern  . Not on file   Social History Narrative  . No narrative on file     PHYSICAL EXAM: Vital signs in last 24 hours: Temp:  [97.8 F (36.6 C)] 97.8 F (36.6 C) (07/27 0358) Pulse Rate:  [60-88] 83  (07/27 1000) Resp:  [14-24] 19  (07/27 1000) BP: (89-139)/(44-90) 136/86 mmHg (07/27 1000) SpO2:  [92 %-97 %] 97 % (07/27 1000)  General: Pale, sedated WM.  Not on vent Head:  No trauma, no swelling  Eyes:  No icterus or pallor Ears:  Unable to assess hearing  Nose:  No bleeding Mouth:  Some old dry blood at roof of mouth.  Poor  dentition Neck:  No mass Lungs:  Clear B.  No resp distress.  No gynecomastia. Heart: RRR Abdomen:  Soft, NT, ND, no mass or HSM.   Rectal: deferred   Musc/Skeltl: no joint swelling or redness. Extremities:  No pedal edema GU:  No scrotal edema, foley in place  Neurologic:  Unresponsive to exam Skin:  Burn scars on left arm.  No spiders, no palmar erythema Tattoos:  One on right shoulder Nodes:  No adenopathy at   Psych:  Unable to assess as he is sedated.   LAB RESULTS:  Basename 08/01/11 0839 08/01/11 0435  WBC 10.5 9.7  HGB 15.5 14.7  HCT 43.2 40.5  PLT 175 163   BMET Lab Results  Component Value Date   NA 132* 08/01/2011   NA 142 12/20/2010   NA 130* 05/07/2009   K 3.0* 08/01/2011   K 3.7 12/20/2010   K 3.8 05/07/2009   CL 97 08/01/2011   CL 104 12/20/2010   CL 100 05/07/2009   CO2 21 08/01/2011   CO2 24 12/20/2010   CO2 26 05/07/2009   GLUCOSE 171* 08/01/2011    GLUCOSE 95 12/20/2010   GLUCOSE 101* 05/07/2009   BUN 10 08/01/2011   BUN 7 12/20/2010   BUN 6 05/07/2009   CREATININE 1.09 08/01/2011   CREATININE 0.70 12/20/2010   CREATININE 0.75 05/07/2009   CALCIUM 8.4 08/01/2011   CALCIUM 8.9 12/20/2010   CALCIUM 8.1* 05/07/2009   ETOH level 189  LFT  Basename 08/01/11 0435  PROT 5.8*  ALBUMIN 3.2*  AST 22  ALT 29  ALKPHOS 57  BILITOT 0.2*  BILIDIR --  IBILI --   PT/INR Lab Results  Component Value Date   INR 0.94 12/20/2010   Drugs of Abuse     Component Value Date/Time   LABOPIA NONE DETECTED 08/01/2011 0424   COCAINSCRNUR NONE DETECTED 08/01/2011 0424   LABBENZ NONE DETECTED 08/01/2011 0424   AMPHETMU NONE DETECTED 08/01/2011 0424   THCU NONE DETECTED 08/01/2011 0424   LABBARB NONE DETECTED 08/01/2011 0424     RADIOLOGY STUDIES: Dg Chest 1 View 08/01/2011  *RADIOLOGY REPORT*  Clinical Data: Hypertension, smoker, alcohol intoxication.  CHEST - 1 VIEW  Comparison: 05/09/2009  Findings: Degraded by hypoaeration, patient body habitus, and portable technique.  Cardiomediastinal contours are likely unchanged allowing for differences in technique/respiratory effort. Mild retrocardiac scarring or atelectasis.  Otherwise, no definite focal consolidation, pleural effusion, or pneumothorax. Left clavicle deformity again noted.  No definite acute osseous finding.  IMPRESSION: Within limitations described above.  No definite radiographic evidence of acute cardiopulmonary process.  Original Report Authenticated By: Waneta Martins, M.D.   Ct Head Wo Contrast 08/01/2011  *RADIOLOGY REPORT*  Clinical Data: Alcohol intoxication and uncooperative.  CT HEAD WITHOUT CONTRAST  Technique:  Contiguous axial images were obtained from the base of the skull through the vertex without contrast.  Comparison: 12/20/2010  Findings: The images were repeated multiple times due to excessive patient motion.  There is no gross hemorrhage, mass lesion, midline shift,  hydrocephalus or large infarct.  There is mucosal disease in the right maxillary sinus and fluid within the mastoid air cells. Mastoid air fluid was present on the prior examination.  There is mucosal disease in the ethmoid air cells.  Limited evaluation of the upper cervical spine due to motion.  IMPRESSION: This study is limited due to excessive patient motion.  No gross intracranial abnormality.  Original Report Authenticated By: Richarda Overlie, M.D.  ENDOSCOPIC STUDIES: 06/2009  EGD   Dr Marina Goodell  ENDOSCOPIC IMPRESSION:  1) Barrett's esophagus in the distal esophagus  2) Normal stomach  3) Duodenitis in the bulb of duodenum  4) GERD  RECOMMENDATIONS:  1) Await biopsy results  2) REPEAT SURVEILLANCE EGD IN ONE YEAR IF NO DYSPLASIA  3) Continue PROTONIX TWICE DAILY  4) Call office next 2-3 days to schedule an office appointment  for 4-6 WEEKS  Pathology 1. ESOPHAGUS, BIOPSY, BARRETT''S : INTESTINAL METAPLASIA (GOBLET CELL METAPLASIA) CONSISTENT WITH BARRETT' S ESOPHAGUS.NO DYSPLASIA OR MALIGNANCY IDENTIFIED. 2. ESOPHAGUS, BIOPSY, BARRETT''S : INTESTINAL METAPLASIA (GOBLET CELL METAPLASIA) CONSISTENT WITH BARRETT' S ESOPHAGUS.NO DYSPLASIA OR MALIGNANCY IDENTIFIED    IMPRESSION: *  CG emesis.  Likely etoh or acid/peptic gastritis/duodenitis.  Seems to not be taking any GI meds. Hx Barrett's *  Acute etoh intoxication.  Agitation requiring sedatives and physical restraints.  LFTs normal   PLAN: *  EGD before discharge, but can hold off on this unless acutely anemic or vomitting blood.   Continue Protonix Iv bid, does not need the drip.  Follow CBC.  Keep NPO.  CIWA withdrawal protocol in place.    LOS: 0 days   Jennye Moccasin  08/01/2011, 10:52 AM Pager: [phone]

## 2011-08-01 NOTE — Progress Notes (Signed)
Bilateral wrist restraints D/C due to increasing agitation.  Patient following commands, redirected easily, and not attempting to pull lines or get OOB.  Will continue to monitor.

## 2011-08-01 NOTE — Consult Note (Signed)
Chart was reviewed and patient was examined. X-rays were reviewed.   Pt is not actively GI bleeding.  He needs f/u EGD for Barrett's esophagus but not in this setting.  Hold EGD unless he develops frank bleeding.  He can see Dr Marina Goodell as an outpt.     Signing off.  Barbette Hair. Arlyce Dice, M.D., [facility] Cell [phone]

## 2011-08-01 NOTE — ED Notes (Signed)
EMS called to home.  Found patient in kitchen responsive only to painful  Stimuli.  Family states that he has been drinking all evening and has a  History of drinking.  V/S signs stable by EMS

## 2011-08-01 NOTE — H&P (Signed)
[facility] History and Physical  Jesse Hartman GEX:[id number] DOB: December 25, 1960 DOA: 08/01/2011  Referring physician: ER PCP: No primary provider on file.   Chief Complaint: alcohol intoxication  HPI:   Patient has history of alcoholism and drinking heavily. He was out of beer for about a week but went on a binge last PM.  Daughter not sure if he took any other drugs/medications.  He has also not been taking his PPI for hs GERD as he is supposed to.   His daughter reported he been drinking tonight however she was concerned when she heard a thump down stairs and went upstairs and found him on the ground, snoring.  She "thinks he overdid it with the beer" but she called EMS and they brought the patient to the hospital. In ER Patient only responded to deep sternal rub.  He was taken to the CT scan where he vomited "coffee ground" type vomitus.    Daughter reported history of tear in esophagus  Patient has been falling asleep mid sentence. Says last scope was 1 1/2 years ago.   Review of Systems:  Unable to do secondary to ams  Past Medical History  Diagnosis Date  . Hypertension   . Barrett's syndrome   . Arthritis    Past Surgical History  Procedure Date  . Back surgery   . Rhinoplasty   . Bunionectomy   . Appendectomy    Social History:  reports that he has been smoking Cigarettes.  He has a 15 pack-year smoking history. He does not have any smokeless tobacco history on file. He reports that he drinks alcohol. His drug history not on file. Up to a Case/day  No Known Allergies  History reviewed. No pertinent family history. - unable to get from patient  Prior to Admission medications   Not on File   Physical Exam: Filed Vitals:   08/01/11 0358 08/01/11 0721 08/01/11 0915 08/01/11 1000  BP: 89/44 139/58 138/66 136/86  Pulse:  86 88 83  Temp: 97.8 F (36.6 C)     TempSrc: Oral     Resp: 23  24 19   SpO2: 92% 94% 92% 97%     General:  Sleepy with snoring  respirations, altered  Eyes: WNL  ENT: mouth has dark blood on tongue and top of mouth, dry  Neck: supple  Cardiovascular: rrr  Respiratory: clear anteriorly  Abdomen: +BS, soft, NT/ND  Skin: Right leg with old wounds on shin and knee  Musculoskeletal: moves all 4 extremities, mumbling words, falls asleep mid-sentence  Psychiatric: no SI/no HI  Neurologic: mumbling  Labs on Admission:  Basic Metabolic Panel:  Lab 08/01/11 0102  NA 132*  K 3.0*  CL 97  CO2 21  GLUCOSE 171*  BUN 10  CREATININE 1.09  CALCIUM 8.4  MG --  PHOS --   Liver Function Tests:  Lab 08/01/11 0435  AST 22  ALT 29  ALKPHOS 57  BILITOT 0.2*  PROT 5.8*  ALBUMIN 3.2*   No results found for this basename: LIPASE:5,AMYLASE:5 in the last 168 hours No results found for this basename: AMMONIA:5 in the last 168 hours CBC:  Lab 08/01/11 1044 08/01/11 0839 08/01/11 0435  WBC -- 10.5 9.7  NEUTROABS -- -- 7.7  HGB 14.6 15.5 14.7  HCT 40.1 43.2 40.5  MCV -- 93.3 92.3  PLT -- 175 163   Cardiac Enzymes: No results found for this basename: CKTOTAL:5,CKMB:5,CKMBINDEX:5,TROPONINI:5 in the last 168 hours  BNP (last 3 results) No  results found for this basename: PROBNP:3 in the last 8760 hours CBG: No results found for this basename: GLUCAP:5 in the last 168 hours  Radiological Exams on Admission: Dg Chest 1 View  08/01/2011  *RADIOLOGY REPORT*  Clinical Data: Hypertension, smoker, alcohol intoxication.  CHEST - 1 VIEW  Comparison: 05/09/2009  Findings: Degraded by hypoaeration, patient body habitus, and portable technique.  Cardiomediastinal contours are likely unchanged allowing for differences in technique/respiratory effort. Mild retrocardiac scarring or atelectasis.  Otherwise, no definite focal consolidation, pleural effusion, or pneumothorax. Left clavicle deformity again noted.  No definite acute osseous finding.  IMPRESSION: Within limitations described above.  No definite radiographic  evidence of acute cardiopulmonary process.  Original Report Authenticated By: Waneta Martins, M.D.   Ct Head Wo Contrast  08/01/2011  *RADIOLOGY REPORT*  Clinical Data: Alcohol intoxication and uncooperative.  CT HEAD WITHOUT CONTRAST  Technique:  Contiguous axial images were obtained from the base of the skull through the vertex without contrast.  Comparison: 12/20/2010  Findings: The images were repeated multiple times due to excessive patient motion.  There is no gross hemorrhage, mass lesion, midline shift, hydrocephalus or large infarct.  There is mucosal disease in the right maxillary sinus and fluid within the mastoid air cells. Mastoid air fluid was present on the prior examination.  There is mucosal disease in the ethmoid air cells.  Limited evaluation of the upper cervical spine due to motion.  IMPRESSION: This study is limited due to excessive patient motion.  No gross intracranial abnormality.  Original Report Authenticated By: Richarda Overlie, M.D.      Assessment/Plan Active Problems:  Hematemesis  Alcohol abuse  Alcohol intoxication  Hypokalemia  Hyponatremia   1. Hematemesis: h/o barrett's esophagus, H/H stable- trend, follows with Dr. Marina Goodell- consult GI, has not been taking his PPI lately 2. Alcohol intoxication/abuse: had not had alcohol in a few days but went on a binge last PM; CIWA- may need presidex and a critical care consult, requiring restraints, UDS pending- psych consult once patient able to speak 3. AMS- still very sleepy, moves all 4 extremities but mumbles his speech incoherently- he will occasionally speak clearly. 4. Hypokalemia- repleat 5. Hyponatremia- secondary to beer ingestion  Code Status: full Family Communication: daughter called Disposition Plan: unkn  Time spent: 70 min  Marlin Canary [facility] Pager [phone]  08/01/2011, 10:58 AM

## 2011-08-02 DIAGNOSIS — K2920 Alcoholic gastritis without bleeding: Secondary | ICD-10-CM

## 2011-08-02 DIAGNOSIS — K219 Gastro-esophageal reflux disease without esophagitis: Secondary | ICD-10-CM

## 2011-08-02 LAB — CBC
Platelets: 178 10*3/uL (ref 150–400)
RBC: 4.42 MIL/uL (ref 4.22–5.81)
WBC: 10.1 10*3/uL (ref 4.0–10.5)

## 2011-08-02 LAB — ETHANOL: Alcohol, Ethyl (B): <11 mg/dL (ref 0–11)

## 2011-08-02 LAB — BASIC METABOLIC PANEL
CO2: 22 mEq/L (ref 19–32)
Calcium: 8.8 mg/dL (ref 8.4–10.5)
GFR calc Af Amer: >90 mL/min (ref >90)
GFR calc non Af Amer: >90 mL/min (ref >90)
Sodium: 133 mEq/L — ABNORMAL LOW (ref 135–145)

## 2011-08-02 MED ORDER — MORPHINE SULFATE 2 MG/ML IJ SOLN
2.0000 mg | INTRAMUSCULAR | Status: DC | PRN
  Administered 2011-08-02: 2 mg via INTRAVENOUS

## 2011-08-02 MED ORDER — PANTOPRAZOLE SODIUM 40 MG PO TBEC
40.0000 mg | DELAYED_RELEASE_TABLET | Freq: Every day | ORAL | Status: DC
  Administered 2011-08-03: 40 mg via ORAL
  Filled 2011-08-02 (×2): qty 1

## 2011-08-02 MED ORDER — PHENOL 1.4 % MT LIQD
1.0000 | OROMUCOSAL | Status: DC | PRN
  Filled 2011-08-02: qty 177

## 2011-08-02 MED ORDER — ALUM & MAG HYDROXIDE-SIMETH 200-200-20 MG/5ML PO SUSP
30.0000 mL | Freq: Four times a day (QID) | ORAL | Status: DC | PRN
Start: 2011-08-02 — End: 2011-08-03
  Administered 2011-08-02: 30 mL via ORAL
  Filled 2011-08-02: qty 30

## 2011-08-02 MED ORDER — MORPHINE SULFATE 2 MG/ML IJ SOLN
INTRAMUSCULAR | Status: AC
  Filled 2011-08-02: qty 1

## 2011-08-02 NOTE — Progress Notes (Signed)
     [facility] Daily Rounding Note 08/02/2011, 12:02 PM  SUBJECTIVE:       Mental status improved.  No nausea.  No further CG/hemetemesis. No stools Feels burning in esophagus, chest with clears.  This happens at home but is currently worse. Says was not taking PPI for about one year because he lost his job 2 years ago and could not afford Still on bed rest in ICU  OBJECTIVE:         Vital signs in last 24 hours:    Temp:  [98 F (36.7 C)-99.9 F (37.7 C)] 98 F (36.7 C) (07/28 0800) Pulse Rate:  [88-123] 90  (07/28 1000) Resp:  [16-26] 20  (07/28 1000) BP: (98-144)/(54-122) 122/82 mmHg (07/28 1000) SpO2:  [93 %-98 %] 93 % (07/28 1000) Weight:  [231 lb 1.6 oz (104.826 kg)-245 lb 12.8 oz (111.494 kg)] 231 lb 1.6 oz (104.826 kg) (07/28 0800) Last BM Date: 07/31/11 General: looks chronically unwell.  No acutely ill.  Old for age.  Red in the face.  Heart: RRR Chest: clear Abdomen: soft, active BS, NT, obese  Extremities: no pedal edema Neuro/Psych:  Pleasant, not confused or inappropriate.  Intake/Output from previous day: 07/27 0701 - 07/28 0700 In: 2320 [I.V.:2200; IV Piggyback:120] Out: 6525 [Urine:6525]  Intake/Output this shift: Total I/O In: 201.7 [I.V.:201.7] Out: 225 [Urine:225]  Lab Results:  Basename 08/02/11 0349 08/01/11 2208 08/01/11 1044 08/01/11 0839 08/01/11 0435  WBC 10.1 -- -- 10.5 9.7  HGB 14.9 15.3 14.6 -- --  HCT 41.7 43.0 40.1 -- --  PLT 178 -- -- 175 163   BMET  Basename 08/02/11 0349 08/01/11 0435  NA 133* 132*  K 3.9 3.0*  CL 103 97  CO2 22 21  GLUCOSE 97 171*  BUN 12 10  CREATININE 0.77 1.09  CALCIUM 8.8 8.4   LFT  Basename 08/01/11 0435  PROT 5.8*  ALBUMIN 3.2*  AST 22  ALT 29  ALKPHOS 57  BILITOT 0.2*  BILIDIR --  IBILI --   ASSESMENT: * CG emesis. Likely etoh or acid/peptic gastritis/duodenitis. Seems to not be taking any GI meds.  Hx Barrett's No associated anemia  * Acute etoh intoxication. Agitation requiring  sedatives and physical restraints.  LFTs normal  *  Glucose intolerance    PLAN: *  EGD before discharge, 7/29?Marland Kitchen   *  Allow carb mod diet, it will not cause more or less burning than clears and he is hungry. *  Dc foley, activity ad lib, po protonix.  Can transfer to floor.  *  Continue the Ativan withdrawal  Protocol.     LOS: 1 day   Jennye Moccasin  08/02/2011, 12:02 PM Pager: [phone]

## 2011-08-02 NOTE — Progress Notes (Signed)
I have personally taken an interval history, reviewed the chart, and examined the patient.  I agree with the extender's note, impression and recommendations.  EGD on 7/29.

## 2011-08-02 NOTE — Progress Notes (Signed)
[facility] PROGRESS NOTE  Jesse Hartman ZOX:[id number] DOB: 1960/09/08 DOA: 08/01/2011 PCP: No primary provider on file.  Assessment/Plan: Active Problems:  Hematemesis  Alcohol abuse  Alcohol intoxication  Hypokalemia  Hyponatremia  1. Hematemesis- resolved, continue to monitor, H/H stable- known history of barrett's, follow up as outpatient, will allow patient to eat and monitor overnight 2. alcohol abuse- information for rehab given, CIWA protocol, patient is about 24 hours into withdrawal, next 24 critical for worsening of symptoms 3. Hyponatremia- correcting with IVf 4. hypo[place]lemia- replaced  D/C foley  Code Status: full Family Communication: patient at bedside Disposition Plan: home tomm?     Consultants:  GI   HPI/Subjective: C/o GERD like pain, pain with coughing and deep breath   Objective: Filed Vitals:   08/02/11 0500 08/02/11 0600 08/02/11 0700 08/02/11 0800  BP: 122/75 124/65 121/81 121/60  Pulse: 93 101 94 88  Temp:    98 F (36.7 C)  TempSrc:    Oral  Resp: 21 21 22 21   Height:    6\' 3"  (1.905 m)  Weight:    104.826 kg (231 lb 1.6 oz)  SpO2: 97% 94% 94% 94%    Intake/Output Summary (Last 24 hours) at 08/02/11 1047 Last data filed at 08/02/11 1000  Gross per 24 hour  Intake 2521.67 ml  Output   6750 ml  Net -4228.33 ml    Exam:   General:  Speaking much better today, NAD, A+Ox3  Cardiovascular: rrr, chest wall tender to palpation  Respiratory: clear anteriorly  Abdomen: +BS, soft, NT/ND  Data Reviewed: Basic Metabolic Panel:  Lab 08/02/11 8119 08/01/11 0435  NA 133* 132*  K 3.9 3.0*  CL 103 97  CO2 22 21  GLUCOSE 97 171*  BUN 12 10  CREATININE 0.77 1.09  CALCIUM 8.8 8.4  MG -- 2.0  PHOS -- --   Liver Function Tests:  Lab 08/01/11 0435  AST 22  ALT 29  ALKPHOS 57  BILITOT 0.2*  PROT 5.8*  ALBUMIN 3.2*   No results found for this basename: LIPASE:5,AMYLASE:5 in the last 168 hours No results found for this  basename: AMMONIA:5 in the last 168 hours CBC:  Lab 08/02/11 0349 08/01/11 2208 08/01/11 1044 08/01/11 0839 08/01/11 0435  WBC 10.1 -- -- 10.5 9.7  NEUTROABS -- -- -- -- 7.7  HGB 14.9 15.3 14.6 15.5 14.7  HCT 41.7 43.0 40.1 43.2 40.5  MCV 94.3 -- -- 93.3 92.3  PLT 178 -- -- 175 163   Cardiac Enzymes: No results found for this basename: CKTOTAL:5,CKMB:5,CKMBINDEX:5,TROPONINI:5 in the last 168 hours BNP (last 3 results) No results found for this basename: PROBNP:3 in the last 8760 hours CBG: No results found for this basename: GLUCAP:5 in the last 168 hours  Recent Results (from the past 240 hour(s))  MRSA PCR SCREENING     Status: Normal   Collection Time   08/01/11 10:03 AM      Component Value Range Status Comment   MRSA by PCR NEGATIVE  NEGATIVE Final      Studies: Dg Chest 1 View  08/01/2011  *RADIOLOGY REPORT*  Clinical Data: Hypertension, smoker, alcohol intoxication.  CHEST - 1 VIEW  Comparison: 05/09/2009  Findings: Degraded by hypoaeration, patient body habitus, and portable technique.  Cardiomediastinal contours are likely unchanged allowing for differences in technique/respiratory effort. Mild retrocardiac scarring or atelectasis.  Otherwise, no definite focal consolidation, pleural effusion, or pneumothorax. Left clavicle deformity again noted.  No definite acute osseous finding.  IMPRESSION: Within limitations described above.  No definite radiographic evidence of acute cardiopulmonary process.  Original Report Authenticated By: Waneta Martins, M.D.   Ct Head Wo Contrast  08/01/2011  *RADIOLOGY REPORT*  Clinical Data: Alcohol intoxication and uncooperative.  CT HEAD WITHOUT CONTRAST  Technique:  Contiguous axial images were obtained from the base of the skull through the vertex without contrast.  Comparison: 12/20/2010  Findings: The images were repeated multiple times due to excessive patient motion.  There is no gross hemorrhage, mass lesion, midline shift,  hydrocephalus or large infarct.  There is mucosal disease in the right maxillary sinus and fluid within the mastoid air cells. Mastoid air fluid was present on the prior examination.  There is mucosal disease in the ethmoid air cells.  Limited evaluation of the upper cervical spine due to motion.  IMPRESSION: This study is limited due to excessive patient motion.  No gross intracranial abnormality.  Original Report Authenticated By: Richarda Overlie, M.D.    Scheduled Meds:   . LORazepam  0-4 mg Intravenous Q6H   Followed by  . LORazepam  0-4 mg Intravenous Q12H  . pantoprazole (PROTONIX) IV  40 mg Intravenous Q12H  . potassium chloride  10 mEq Intravenous Q1 Hr x 2  . banana bag IV fluid 1000 mL   Intravenous Once  . banana bag IV fluid 1000 mL   Intravenous Once  . DISCONTD: pantoprazole (PROTONIX) IV  80 mg Intravenous Once   Continuous Infusions:   . sodium chloride 100 mL/hr at 08/02/11 0759  . DISCONTD: pantoprozole (PROTONIX) infusion      Active Problems:  Hematemesis  Alcohol abuse  Alcohol intoxication  Hypokalemia  Hyponatremia    Time spent: 30    Marlin Canary  [facility] Pager [phone]  08/02/2011, 10:47 AM  LOS: 1 day

## 2011-08-02 NOTE — Progress Notes (Signed)
Clinical Social Work Department BRIEF PSYCHOSOCIAL ASSESSMENT 08/02/2011  Patient:  Jesse Hartman, Jesse Hartman     Account Number:  [id number]     Admit date:  08/01/2011  Clinical Social Worker:  Leron Croak, CLINICAL SOCIAL WORKER  Date/Time:  08/02/2011 02:56 PM  Referred by:  Physician  Date Referred:  08/01/2011 Referred for  Substance Abuse   Other Referral:   Interview type:  Patient Other interview type:    PSYCHOSOCIAL DATA Living Status:  FAMILY Admitted from facility:   Level of care:   Primary support name:  Momin Misko Primary support relationship to patient:  CHILD, ADULT Degree of support available:   good    CURRENT CONCERNS Current Concerns  Substance Abuse   Other Concerns:    SOCIAL WORK ASSESSMENT / PLAN CSW met with the Pt to discuss rerral for substance abuse. Pt stated that he "normally" is a controlled drinker but that in the last two years his drinking has "got out of hand". Pt stated that he drinks mostly beers but has been drinking liquor. Pt is marginally modivated for tx. Pt stated that before his being a single parent kept him from completing tx. Pt "feels" he needs tx now but still seemed hesitant for change.   Assessment/plan status:  Information/Referral to [facility] Other assessment/ plan:   Information/referral to community resources:   CSW provided Pt with information about local substance abuse programs. Pt has completed only 2 days at [facility] several years ago , along with minimal out patient services near the same time.    PATIENT'S/FAMILY'S RESPONSE TO PLAN OF CARE: Pt was reluctant to discuss in deapth his substance abuse however was receptive to recieving information on out patient and in patient services.       Leron Croak, LCSWA [employer] [phone]

## 2011-08-03 ENCOUNTER — Encounter (HOSPITAL_COMMUNITY)
Admission: EM | Disposition: A | Discharge status: Home / Self Care | Payer: Self-pay | Source: Home / Self Care | Attending: Internal Medicine

## 2011-08-03 ENCOUNTER — Encounter (HOSPITAL_COMMUNITY): Payer: Self-pay | Admitting: Gastroenterology

## 2011-08-03 ENCOUNTER — Telehealth: Payer: Self-pay

## 2011-08-03 DIAGNOSIS — R1310 Dysphagia, unspecified: Secondary | ICD-10-CM

## 2011-08-03 DIAGNOSIS — K209 Esophagitis, unspecified without bleeding: Secondary | ICD-10-CM

## 2011-08-03 DIAGNOSIS — K2270 Barrett's esophagus without dysplasia: Secondary | ICD-10-CM

## 2011-08-03 HISTORY — PX: ESOPHAGOGASTRODUODENOSCOPY: SHX5428

## 2011-08-03 LAB — BASIC METABOLIC PANEL
CO2: 25 mEq/L (ref 19–32)
Calcium: 8.7 mg/dL (ref 8.4–10.5)
Glucose, Bld: 94 mg/dL (ref 70–99)
Sodium: 131 mEq/L — ABNORMAL LOW (ref 135–145)

## 2011-08-03 LAB — CBC
HCT: 40.2 % (ref 39.0–52.0)
Hemoglobin: 14.1 g/dL (ref 13.0–17.0)
MCH: 33.1 pg (ref 26.0–34.0)
MCV: 94.4 fL (ref 78.0–100.0)
RBC: 4.26 MIL/uL (ref 4.22–5.81)

## 2011-08-03 SURGERY — EGD (ESOPHAGOGASTRODUODENOSCOPY)
Anesthesia: Moderate Sedation

## 2011-08-03 MED ORDER — PANTOPRAZOLE SODIUM 40 MG PO TBEC
40.0000 mg | DELAYED_RELEASE_TABLET | Freq: Two times a day (BID) | ORAL | Status: DC
  Administered 2011-08-03: 40 mg via ORAL

## 2011-08-03 MED ORDER — MIDAZOLAM HCL 10 MG/2ML IJ SOLN
INTRAMUSCULAR | Status: AC
  Filled 2011-08-03: qty 4

## 2011-08-03 MED ORDER — FENTANYL CITRATE 0.05 MG/ML IJ SOLN
INTRAMUSCULAR | Status: DC | PRN
  Administered 2011-08-03 (×5): 25 ug via INTRAVENOUS

## 2011-08-03 MED ORDER — DIPHENHYDRAMINE HCL 50 MG/ML IJ SOLN
INTRAMUSCULAR | Status: AC
  Filled 2011-08-03: qty 1

## 2011-08-03 MED ORDER — AMOXICILLIN-POT CLAVULANATE 875-125 MG PO TABS
1.0000 | ORAL_TABLET | Freq: Two times a day (BID) | ORAL | Status: DC
  Administered 2011-08-03: 1 via ORAL
  Filled 2011-08-03 (×2): qty 1

## 2011-08-03 MED ORDER — LANSOPRAZOLE 30 MG PO CPDR: 30.0000 mg | DELAYED_RELEASE_CAPSULE | Freq: Two times a day (BID) | ORAL | Status: DC

## 2011-08-03 MED ORDER — AMOXICILLIN-POT CLAVULANATE 875-125 MG PO TABS: 1.0000 | ORAL_TABLET | Freq: Two times a day (BID) | ORAL | Status: AC

## 2011-08-03 MED ORDER — DIPHENHYDRAMINE HCL 50 MG/ML IJ SOLN
INTRAMUSCULAR | Status: DC | PRN
  Administered 2011-08-03: 50 mg via INTRAVENOUS

## 2011-08-03 MED ORDER — MIDAZOLAM HCL 10 MG/2ML IJ SOLN
INTRAMUSCULAR | Status: DC | PRN
  Administered 2011-08-03 (×5): 2.5 mg via INTRAVENOUS

## 2011-08-03 MED ORDER — BUTAMBEN-TETRACAINE-BENZOCAINE 2-2-14 % EX AERO
INHALATION_SPRAY | CUTANEOUS | Status: DC | PRN
  Administered 2011-08-03: 2 via TOPICAL

## 2011-08-03 MED ORDER — FENTANYL CITRATE 0.05 MG/ML IJ SOLN
INTRAMUSCULAR | Status: AC
  Filled 2011-08-03: qty 4

## 2011-08-03 MED ORDER — AMOXICILLIN-POT CLAVULANATE 875-125 MG PO TABS: 1.0000 | ORAL_TABLET | Freq: Two times a day (BID) | ORAL | Status: DC

## 2011-08-03 NOTE — Op Note (Signed)
[facility] [address] [place], [place]  [address]  ENDOSCOPY PROCEDURE REPORT PATIENT:  Jesse Hartman, Jesse Hartman  MR#:  [id number] BIRTHDATE:  09-Apr-1960, 51 yrs. old  GENDER:  male ENDOSCOPIST:  Rachael Fee, MD PROCEDURE DATE:  08/03/2011 PROCEDURE:  EGD, diagnostic [phone] ASA CLASS:  Class III INDICATIONS:  alcoholism; recent coffee ground emesis (normal Hb); history of long segment Barrett's esophagus (Dr. Marina Goodell) without dysplasia on biopsies 06/2009 MEDICATIONS:   Fentanyl 125 mcg IV, Benadryl 50 mg IV, Versed 12.5 mg IV TOPICAL ANESTHETIC:  Cetacaine Spray DESCRIPTION OF PROCEDURE:   After the risks benefits and alternatives of the procedure were thoroughly explained, informed consent was obtained.  The Pentax Gastroscope [id number] endoscope was introduced through the mouth and advanced to the second portion of the duodenum, without limitations.  The instrument was slowly withdrawn as the mucosa was fully examined. <<PROCEDUREIMAGES>> There was severe, ulcerative esophagitis from GE junction at 39cm to 24cm from incisors (15cm of severe ulcerative esophagitis). Underlying mucosa, known Barrett's changes were not at all visible through the acute inflamation, ulceration (see image3, image5, and image2).  Duodenitis was found. This was mild, bulbar (see image9).  Otherwise the examination was normal (see image8 and image7).    Retroflexed views revealed no abnormalities.    The scope was then withdrawn from the patient and the procedure completed. COMPLICATIONS:  None ENDOSCOPIC IMPRESSION: 1) Severe ulcerative esophagitis throughout most of his esophagus. This completely precluded view of known underlying Barrett's changes. 2) Bulbar duodenitis 3) Otherwise normal examination  RECOMMENDATIONS: He was not taking previously recommended PPI prior to admission. He is currently on once daily in hosp.  I will increase this to twice daily (1 pill 20-30 min prior to BF and  dinner meals).  He will be set up for follow up appt with Dr. Marina Goodell in 3-4 weeks to consider repeat EGD as outpatient to survey Barrett's after severe inflammation has improved.  HE SHOULD STOP DRINKING ALCOHOL.  ______________________________ Rachael Fee, MD  cc: Yancey Flemings, MD  n. eSIGNED:   Rachael Fee at 08/03/2011 09:34 AM  Candis Schatz, [phone]

## 2011-08-03 NOTE — Telephone Encounter (Signed)
Pt scheduled to see Dr. Marina Goodell 08/31/11@9 :15am. Letter mailed to pt regarding appt.

## 2011-08-03 NOTE — Plan of Care (Signed)
Problem: Discharge Progression Outcomes Goal: Hemodynamically stable Outcome: Completed/Met Date Met:  08/03/11 Last recorded blood pressure-123/84

## 2011-08-03 NOTE — Plan of Care (Signed)
Problem: Discharge Progression Outcomes Goal: Activity appropriate for discharge plan Outcome: Completed/Met Date Met:  08/03/11 Pt. walking around room.

## 2011-08-03 NOTE — Interval H&P Note (Signed)
History and Physical Interval Note:  08/03/2011 8:30 AM  Jesse Hartman  has presented today for surgery, with the diagnosis of hx barretts and cg emesis  The various methods of treatment have been discussed with the patient and family. After consideration of risks, benefits and other options for treatment, the patient has consented to  Procedure(s) (LRB): ESOPHAGOGASTRODUODENOSCOPY (EGD) (N/A) as a surgical intervention .  The patient's history has been reviewed, patient examined, no change in status, stable for surgery.  I have reviewed the patient's chart and labs.  Questions were answered to the patient's satisfaction.     Rob Bunting

## 2011-08-03 NOTE — Discharge Summary (Signed)
Physician Discharge Summary  Jesse Hartman:[id number] DOB: 24-Dec-1960 DOA: 08/01/2011  PCP: No primary provider on file.  Admit date: 08/01/2011 Discharge date: 08/03/2011  Recommendations for Outpatient Follow-up:  1. Rehab for alcohol abuse- information provided in hospital  Discharge Diagnoses:  Active Problems:  Hematemesis  Alcohol abuse  Alcohol intoxication  Hypokalemia  Hyponatremia  Barrett esophagus  Dysphagia  Esophagitis   Discharge Condition: improved  Diet recommendation: bland  Wt Readings from Last 3 Encounters:  08/03/11 106.1 kg (233 lb 14.5 oz)  08/03/11 106.1 kg (233 lb 14.5 oz)  06/11/09 104.5 kg (230 lb 6.1 oz)    History of present illness:  Patient has history of alcoholism and drinking heavily. He was out of beer for about a week but went on a binge last PM. Daughter not sure if he took any other drugs/medications. He has also not been taking his PPI for hs GERD as he is supposed to. His daughter reported he been drinking tonight however she was concerned when she heard a thump down stairs and went upstairs and found him on the ground, snoring. She "thinks he overdid it with the beer" but she called EMS and they brought the patient to the hospital. In ER Patient only responded to deep sternal rub. He was taken to the CT scan where he vomited "coffee ground" type vomitus.    Hospital Course:  1. Hematemesis with ulcerative esophagitis found on EGD- PPI (prevacid as patient can not afford protonix), H/H stable- known history of barrett's, follow up as outpatient for EGD, patient eating (bland diet) 2. alcohol abuse- information for rehab given,  3. Hyponatremia- secondary to beer ingestion- needs to refrain from drinking alcohol 4. hypo[place]- replaced   Procedures EGD:1) Severe ulcerative esophagitis throughout most of his  esophagus. This completely precluded view of known underlying  Barrett's changes.  2) Bulbar duodenitis  3) Otherwise  normal examination  RECOMMENDATIONS:  He was not taking previously recommended PPI prior to admission.  He is currently on once daily in hosp. I will increase this to  twice daily (1 pill 20-30 min prior to BF and dinner meals). He  will be set up for follow up appt with Dr. Marina Goodell in 3-4 weeks to  consider repeat EGD as outpatient to survey Barrett's after severe  inflammation has improved. HE SHOULD STOP DRINKING ALCOHOL.  Consultations:  GI  Discharge Exam: Filed Vitals:   08/03/11 0952  BP: 154/99  Pulse:   Temp:   Resp: 15   Filed Vitals:   08/03/11 0940 08/03/11 0945 08/03/11 0949 08/03/11 0952  BP: 144/96 141/93 141/93 154/99  Pulse:      Temp:      TempSrc:      Resp: 14 14 14 15   Height:      Weight:      SpO2: 97% 97% 98% 95%    General: A+Ox3, cooperative, NAD Cardiovascular: rrr Respiratory: +BS, soft, NT/ND  Discharge Instructions  Discharge Orders    Future Orders Please Complete By Expires   Increase activity slowly      Discharge instructions      Comments:   Stop smoking/stop alcohol Bland diet     Medication List  As of 08/03/2011 10:30 AM   TAKE these medications         amoxicillin-clavulanate 875-125 MG per tablet   Commonly known as: AUGMENTIN   Take 1 tablet by mouth every 12 (twelve) hours.      lansoprazole 30 MG  capsule   Commonly known as: PREVACID   Take 1 capsule (30 mg total) by mouth 2 (two) times daily.              The results of significant diagnostics from this hospitalization (including imaging, microbiology, ancillary and laboratory) are listed below for reference.    Significant Diagnostic Studies: Dg Chest 1 View  08/01/2011  *RADIOLOGY REPORT*  Clinical Data: Hypertension, smoker, alcohol intoxication.  CHEST - 1 VIEW  Comparison: 05/09/2009  Findings: Degraded by hypoaeration, patient body habitus, and portable technique.  Cardiomediastinal contours are likely unchanged allowing for differences in  technique/respiratory effort. Mild retrocardiac scarring or atelectasis.  Otherwise, no definite focal consolidation, pleural effusion, or pneumothorax. Left clavicle deformity again noted.  No definite acute osseous finding.  IMPRESSION: Within limitations described above.  No definite radiographic evidence of acute cardiopulmonary process.  Original Report Authenticated By: Waneta Martins, M.D.   Ct Head Wo Contrast  08/01/2011  *RADIOLOGY REPORT*  Clinical Data: Alcohol intoxication and uncooperative.  CT HEAD WITHOUT CONTRAST  Technique:  Contiguous axial images were obtained from the base of the skull through the vertex without contrast.  Comparison: 12/20/2010  Findings: The images were repeated multiple times due to excessive patient motion.  There is no gross hemorrhage, mass lesion, midline shift, hydrocephalus or large infarct.  There is mucosal disease in the right maxillary sinus and fluid within the mastoid air cells. Mastoid air fluid was present on the prior examination.  There is mucosal disease in the ethmoid air cells.  Limited evaluation of the upper cervical spine due to motion.  IMPRESSION: This study is limited due to excessive patient motion.  No gross intracranial abnormality.  Original Report Authenticated By: Richarda Overlie, M.D.    Microbiology: Recent Results (from the past 240 hour(s))  MRSA PCR SCREENING     Status: Normal   Collection Time   08/01/11 10:03 AM      Component Value Range Status Comment   MRSA by PCR NEGATIVE  NEGATIVE Final      Labs: Basic Metabolic Panel:  Lab 08/03/11 1610 08/02/11 0349 08/01/11 0435  NA 131* 133* 132*  K 4.0 3.9 3.0*  CL 99 103 97  CO2 25 22 21   GLUCOSE 94 97 171*  BUN 11 12 10   CREATININE 0.82 0.77 1.09  CALCIUM 8.7 8.8 8.4  MG -- -- 2.0  PHOS -- -- --   Liver Function Tests:  Lab 08/01/11 0435  AST 22  ALT 29  ALKPHOS 57  BILITOT 0.2*  PROT 5.8*  ALBUMIN 3.2*   No results found for this basename:  LIPASE:5,AMYLASE:5 in the last 168 hours No results found for this basename: AMMONIA:5 in the last 168 hours CBC:  Lab 08/03/11 0320 08/02/11 0349 08/01/11 2208 08/01/11 1044 08/01/11 0839 08/01/11 0435  WBC 7.3 10.1 -- -- 10.5 9.7  NEUTROABS -- -- -- -- -- 7.7  HGB 14.1 14.9 15.3 14.6 15.5 --  HCT 40.2 41.7 43.0 40.1 43.2 --  MCV 94.4 94.3 -- -- 93.3 92.3  PLT 167 178 -- -- 175 163   Cardiac Enzymes: No results found for this basename: CKTOTAL:5,CKMB:5,CKMBINDEX:5,TROPONINI:5 in the last 168 hours BNP: BNP (last 3 results) No results found for this basename: PROBNP:3 in the last 8760 hours CBG: No results found for this basename: GLUCAP:5 in the last 168 hours  Time coordinating discharge: 45 minutes  Signed:  Benjamine Mola, JESSICA  [facility] 08/03/2011, 10:30 AM

## 2011-08-03 NOTE — Plan of Care (Signed)
Problem: Discharge Progression Outcomes Goal: Discharge plan in place and appropriate Outcome: Completed/Met Date Met:  08/03/11 Pt. Discharge to home with education regarding cessation of smoking/alcohol.

## 2011-08-03 NOTE — H&P (View-Only) (Signed)
     [facility] Daily Rounding Note 08/02/2011, 12:02 PM  SUBJECTIVE:       Mental status improved.  No nausea.  No further CG/hemetemesis. No stools Feels burning in esophagus, chest with clears.  This happens at home but is currently worse. Says was not taking PPI for about one year because he lost his job 2 years ago and could not afford Still on bed rest in ICU  OBJECTIVE:         Vital signs in last 24 hours:    Temp:  [98 F (36.7 C)-99.9 F (37.7 C)] 98 F (36.7 C) (07/28 0800) Pulse Rate:  [88-123] 90  (07/28 1000) Resp:  [16-26] 20  (07/28 1000) BP: (98-144)/(54-122) 122/82 mmHg (07/28 1000) SpO2:  [93 %-98 %] 93 % (07/28 1000) Weight:  [231 lb 1.6 oz (104.826 kg)-245 lb 12.8 oz (111.494 kg)] 231 lb 1.6 oz (104.826 kg) (07/28 0800) Last BM Date: 07/31/11 General: looks chronically unwell.  No acutely ill.  Old for age.  Red in the face.  Heart: RRR Chest: clear Abdomen: soft, active BS, NT, obese  Extremities: no pedal edema Neuro/Psych:  Pleasant, not confused or inappropriate.  Intake/Output from previous day: 07/27 0701 - 07/28 0700 In: 2320 [I.V.:2200; IV Piggyback:120] Out: 6525 [Urine:6525]  Intake/Output this shift: Total I/O In: 201.7 [I.V.:201.7] Out: 225 [Urine:225]  Lab Results:  Basename 08/02/11 0349 08/01/11 2208 08/01/11 1044 08/01/11 0839 08/01/11 0435  WBC 10.1 -- -- 10.5 9.7  HGB 14.9 15.3 14.6 -- --  HCT 41.7 43.0 40.1 -- --  PLT 178 -- -- 175 163   BMET  Basename 08/02/11 0349 08/01/11 0435  NA 133* 132*  K 3.9 3.0*  CL 103 97  CO2 22 21  GLUCOSE 97 171*  BUN 12 10  CREATININE 0.77 1.09  CALCIUM 8.8 8.4   LFT  Basename 08/01/11 0435  PROT 5.8*  ALBUMIN 3.2*  AST 22  ALT 29  ALKPHOS 57  BILITOT 0.2*  BILIDIR --  IBILI --   ASSESMENT: * CG emesis. Likely etoh or acid/peptic gastritis/duodenitis. Seems to not be taking any GI meds.  Hx Barrett's No associated anemia  * Acute etoh intoxication. Agitation requiring  sedatives and physical restraints.  LFTs normal  *  Glucose intolerance    PLAN: *  EGD before discharge, 7/29?.   *  Allow carb mod diet, it will not cause more or less burning than clears and he is hungry. *  Dc foley, activity ad lib, po protonix.  Can transfer to floor.  *  Continue the Ativan withdrawal  Protocol.     LOS: 1 day   Brennan Karam  08/02/2011, 12:02 PM Pager: [phone]  

## 2011-08-03 NOTE — Plan of Care (Signed)
Problem: Phase III Progression Outcomes Goal: Activity at appropriate level-compared to baseline (UP IN CHAIR FOR HEMODIALYSIS)  Outcome: Completed/Met Date Met:  08/03/11 Pt. Walking in room.

## 2011-08-03 NOTE — Plan of Care (Signed)
Problem: Discharge Progression Outcomes Goal: Other Discharge Outcomes/Goals Outcome: Completed/Met Date Met:  08/03/11 Pt. Given education about resources regarding alcohol abuse.

## 2011-08-03 NOTE — Plan of Care (Signed)
Problem: Discharge Progression Outcomes Goal: Complications resolved/controlled Outcome: Completed/Met Date Met:  08/03/11 Adequate for discharge.

## 2011-08-04 ENCOUNTER — Encounter (HOSPITAL_COMMUNITY): Payer: Self-pay | Admitting: Gastroenterology

## 2011-08-31 ENCOUNTER — Ambulatory Visit: Payer: Self-pay | Admitting: Internal Medicine

## 2011-11-18 DIAGNOSIS — F329 Major depressive disorder, single episode, unspecified: Secondary | ICD-10-CM | POA: Insufficient documentation

## 2011-11-18 DIAGNOSIS — Z720 Tobacco use: Secondary | ICD-10-CM | POA: Insufficient documentation

## 2012-08-05 ENCOUNTER — Emergency Department (HOSPITAL_COMMUNITY): Payer: Medicaid Other

## 2012-08-05 ENCOUNTER — Emergency Department (HOSPITAL_COMMUNITY)
Admission: EM | Admit: 2012-08-05 | Discharge: 2012-08-05 | Disposition: A | Discharge status: Home / Self Care | Payer: Medicaid Other | Attending: Emergency Medicine | Admitting: Emergency Medicine

## 2012-08-05 ENCOUNTER — Encounter (HOSPITAL_COMMUNITY): Payer: Self-pay | Admitting: Emergency Medicine

## 2012-08-05 DIAGNOSIS — Z79899 Other long term (current) drug therapy: Secondary | ICD-10-CM | POA: Insufficient documentation

## 2012-08-05 DIAGNOSIS — Z8719 Personal history of other diseases of the digestive system: Secondary | ICD-10-CM | POA: Insufficient documentation

## 2012-08-05 DIAGNOSIS — Z862 Personal history of diseases of the blood and blood-forming organs and certain disorders involving the immune mechanism: Secondary | ICD-10-CM | POA: Insufficient documentation

## 2012-08-05 DIAGNOSIS — Z86718 Personal history of other venous thrombosis and embolism: Secondary | ICD-10-CM | POA: Insufficient documentation

## 2012-08-05 DIAGNOSIS — Z8639 Personal history of other endocrine, nutritional and metabolic disease: Secondary | ICD-10-CM | POA: Insufficient documentation

## 2012-08-05 DIAGNOSIS — F17200 Nicotine dependence, unspecified, uncomplicated: Secondary | ICD-10-CM | POA: Insufficient documentation

## 2012-08-05 DIAGNOSIS — R0602 Shortness of breath: Secondary | ICD-10-CM | POA: Insufficient documentation

## 2012-08-05 DIAGNOSIS — F10229 Alcohol dependence with intoxication, unspecified: Secondary | ICD-10-CM | POA: Insufficient documentation

## 2012-08-05 DIAGNOSIS — Z8739 Personal history of other diseases of the musculoskeletal system and connective tissue: Secondary | ICD-10-CM | POA: Insufficient documentation

## 2012-08-05 DIAGNOSIS — I10 Essential (primary) hypertension: Secondary | ICD-10-CM | POA: Insufficient documentation

## 2012-08-05 DIAGNOSIS — K219 Gastro-esophageal reflux disease without esophagitis: Secondary | ICD-10-CM | POA: Insufficient documentation

## 2012-08-05 DIAGNOSIS — R079 Chest pain, unspecified: Secondary | ICD-10-CM

## 2012-08-05 LAB — COMPREHENSIVE METABOLIC PANEL
ALT: 46 U/L (ref 0–53)
Alkaline Phosphatase: 60 U/L (ref 39–117)
BUN: 10 mg/dL (ref 6–23)
CO2: 24 mEq/L (ref 19–32)
GFR calc Af Amer: 69 mL/min — ABNORMAL LOW (ref >90)
GFR calc non Af Amer: 60 mL/min — ABNORMAL LOW (ref >90)
Glucose, Bld: 105 mg/dL — ABNORMAL HIGH (ref 70–99)
Potassium: 4 mEq/L (ref 3.5–5.1)
Total Protein: 6.8 g/dL (ref 6.0–8.3)

## 2012-08-05 LAB — CBC
Hemoglobin: 14.4 g/dL (ref 13.0–17.0)
MCH: 33.5 pg (ref 26.0–34.0)
MCV: 95.3 fL (ref 78.0–100.0)
Platelets: 165 10*3/uL (ref 150–400)
RBC: 4.3 MIL/uL (ref 4.22–5.81)

## 2012-08-05 LAB — LIPASE, BLOOD: Lipase: 87 U/L — ABNORMAL HIGH (ref 11–59)

## 2012-08-05 MED ORDER — OXYCODONE HCL 5 MG PO TABS
5.0000 mg | ORAL_TABLET | Freq: Once | ORAL | Status: AC
  Administered 2012-08-05: 5 mg via ORAL
  Filled 2012-08-05: qty 1

## 2012-08-05 NOTE — ED Notes (Signed)
[id number]<AV> Expected date:<BR> Expected time:<BR> Means of arrival:<BR> Comments:<BR> EMS/right sided rib cage pain-heavy ETOH 52 yo male

## 2012-08-05 NOTE — ED Provider Notes (Signed)
CSN: [id number]     Arrival date & time 08/05/12  06-04-50 History     First MD Initiated Contact with Patient 08/05/12 0140     Chief Complaint  Patient presents with  . Chest Pain    HPI Patient reports she was cleaning the kitchen after dinner this evening when he developed some tightness in his chest with associated shortness of breath.  He called EMS reported wheezing on examination therefore given a breathing treatment.  On arrival of emergency department.  He states his pain his chest is significantly improved after the breathing treatment.  He has no known history of coronary artery disease.  Does have a history of COPD per the patient.  He continues to smoke cigarettes.  He has a history of hypertension hyperlipidemia.  No family history of early heart disease.  He denies exertional chest pain shortness of breath.  He denies recent cough or congestion.  No fevers or chills.  He denies abdominal pain nausea or vomiting.  The patient does admit to drinking alcohol this evening.  He admits that he drink alcohol every day and likely has a problem with alcoholism.  He's been referred to outpatient detox before.  He does not request any detox at this time.  He states he started drinking heavier when his wife died in 2000/06/03 at a young age of 11.  He does have a history of Barrett's esophagitis and erosive gastritis with a prior hospitalization this year for upper GI bleed.   Past Medical History  Diagnosis Date  . Hypertension   . Barrett's syndrome   . Arthritis   . Hypokalemia   . Hyponatremia   . Esophagitis   . GERD (gastroesophageal reflux disease)   . DVT (deep venous thrombosis)    Past Surgical History  Procedure Laterality Date  . Back surgery    . Rhinoplasty    . Bunionectomy    . Appendectomy    . Esophagogastroduodenoscopy  08/03/2011    Procedure: ESOPHAGOGASTRODUODENOSCOPY (EGD);  Surgeon: Rachael Fee, MD;  Location: [facility];  Service: Endoscopy;  Laterality: N/A;   . Finger surgery      lt middle   Family History  Problem Relation Age of Onset  . Diabetes Maternal Grandfather    History  Substance Use Topics  . Smoking status: Current Every Day Smoker -- 0.50 packs/day for 30 years    Types: Cigarettes  . Smokeless tobacco: Not on file  . Alcohol Use: Yes    Review of Systems  All other systems reviewed and are negative.    Allergies  Review of patient's allergies indicates no known allergies.  Home Medications   Current Outpatient Rx  Name  Route  Sig  Dispense  Refill  . clonazePAM (KLONOPIN) 0.5 MG tablet   Oral   Take 0.5 mg by mouth 2 (two) times daily as needed for anxiety.         . lansoprazole (PREVACID) 30 MG capsule   Oral   Take 30 mg by mouth daily.         . metoprolol succinate (TOPROL-XL) 25 MG 24 hr tablet   Oral   Take 25 mg by mouth daily.          BP 127/109  Pulse 81  Temp(Src) 98.5 F (36.9 C) (Oral)  Resp 20  SpO2 100% Physical Exam  Nursing note and vitals reviewed. Constitutional: He is oriented to person, place, and time. He appears well-developed and well-nourished.  HENT:  Head: Normocephalic and atraumatic.  Eyes: EOM are normal.  Neck: Normal range of motion.  Cardiovascular: Normal rate, regular rhythm, normal heart sounds and intact distal pulses.   Pulmonary/Chest: Effort normal and breath sounds normal. No respiratory distress.  Abdominal: Soft. He exhibits no distension. There is no tenderness.  Genitourinary: Rectum normal.  Musculoskeletal: Normal range of motion.  Neurological: He is alert and oriented to person, place, and time.  Skin: Skin is warm and dry.  Psychiatric: He has a normal mood and affect. Judgment normal.    ED Course   Procedures (including critical care time)   Date: 08/05/2012  Rate: 74  Rhythm: normal sinus rhythm  QRS Axis: normal  Intervals: normal  ST/T Wave abnormalities: normal  Conduction Disutrbances: none  Narrative Interpretation:    Old EKG Reviewed: No significant changes noted     Labs Reviewed  ETHANOL - Abnormal; Notable for the following:    Alcohol, Ethyl (B) 278 (*)    All other components within normal limits  COMPREHENSIVE METABOLIC PANEL - Abnormal; Notable for the following:    Sodium 128 (*)    Chloride 93 (*)    Glucose, Bld 105 (*)    AST 55 (*)    GFR calc non Af Amer 60 (*)    GFR calc Af Amer 69 (*)    All other components within normal limits  LIPASE, BLOOD - Abnormal; Notable for the following:    Lipase 87 (*)    All other components within normal limits  CBC  TROPONIN I  TROPONIN I   Dg Chest 2 View  08/05/2012   *RADIOLOGY REPORT*  Clinical Data: Chest pain.  Chest pressure.  CHEST - 2 VIEW  Comparison: 08/01/2011.  05/06/2009.  Findings: Lung volumes are improved compared to the prior exams. The cardiopericardial silhouette is upper limits of normal for projection.  The there is no airspace disease.  Mild basilar atelectasis.  Mediastinal contours appear within normal limits. Monitoring leads are projected over the chest. No pneumothorax.  IMPRESSION: No active cardiopulmonary disease.   Original Report Authenticated By: Andreas Newport, M.D.   I personally reviewed the imaging tests through PACS system I reviewed available ER/hospitalization records through the EMR   1. Chest pain   2. Alcohol intoxication in active alcoholic, with unspecified complication     MDM  At this time the patient's pain has resolved with a breathing treatment.  He does have risk factors for cardiac disease however my suspicion is lower.  The patient will stay in the emergency department to allow his alcohol to metabolize.  He will receive 2 sets of cardiac enzymes in the emergency department.  His EKG shows no ischemic changes.  I do think he benefit from outpatient followup with her cardiologist and likely stress testing.  I don't get the sense that this patient is to be admitted today for this.  Some this  may more represent COPD and associated upper airway restriction as his chest pain usually always resolved with breathing treatments.  I had a long discussion with the patient regarding his ongoing alcohol use/abuse.  I made strong recommendations that he needs to quit.  He states he will consider this.  I've given the patient outpatient resources regarding alcohol detox  7:23 AM Patient is feeling better at this time.  EKG and troponin x2 are normal.  The patient be referred to cardiology for outpatient testing.  Lyanne Co, MD 08/05/12 [phone]

## 2012-08-05 NOTE — ED Notes (Addendum)
Per EMS: Pt call out right rib pain and then shortness of breath pt given albuterol tx x2, 125 solu-medrol, .5 Atrovent in route given via EMS. Pt had 1/2 case off beer today. Pt states he was experiencing chest tightness and daughter called EMS, pt admits to 14 beers at least tonight due to a celebration.

## 2012-09-23 ENCOUNTER — Encounter (HOSPITAL_COMMUNITY): Payer: Self-pay | Admitting: *Deleted

## 2012-09-23 ENCOUNTER — Emergency Department (HOSPITAL_COMMUNITY): Payer: Self-pay

## 2012-09-23 ENCOUNTER — Inpatient Hospital Stay (HOSPITAL_COMMUNITY)
Admission: EM | Admit: 2012-09-23 | Discharge: 2012-09-25 | DRG: 193 | Disposition: A | Discharge status: Home / Self Care | Payer: Self-pay | Attending: Internal Medicine | Admitting: Internal Medicine

## 2012-09-23 DIAGNOSIS — K209 Esophagitis, unspecified without bleeding: Secondary | ICD-10-CM | POA: Diagnosis present

## 2012-09-23 DIAGNOSIS — K219 Gastro-esophageal reflux disease without esophagitis: Secondary | ICD-10-CM

## 2012-09-23 DIAGNOSIS — K2270 Barrett's esophagus without dysplasia: Secondary | ICD-10-CM

## 2012-09-23 DIAGNOSIS — J189 Pneumonia, unspecified organism: Principal | ICD-10-CM | POA: Diagnosis present

## 2012-09-23 DIAGNOSIS — G4730 Sleep apnea, unspecified: Secondary | ICD-10-CM | POA: Diagnosis present

## 2012-09-23 DIAGNOSIS — J441 Chronic obstructive pulmonary disease with (acute) exacerbation: Secondary | ICD-10-CM

## 2012-09-23 DIAGNOSIS — I10 Essential (primary) hypertension: Secondary | ICD-10-CM

## 2012-09-23 DIAGNOSIS — E876 Hypokalemia: Secondary | ICD-10-CM

## 2012-09-23 DIAGNOSIS — F10929 Alcohol use, unspecified with intoxication, unspecified: Secondary | ICD-10-CM

## 2012-09-23 DIAGNOSIS — F17200 Nicotine dependence, unspecified, uncomplicated: Secondary | ICD-10-CM | POA: Diagnosis present

## 2012-09-23 DIAGNOSIS — F1010 Alcohol abuse, uncomplicated: Secondary | ICD-10-CM

## 2012-09-23 DIAGNOSIS — J9600 Acute respiratory failure, unspecified whether with hypoxia or hypercapnia: Secondary | ICD-10-CM | POA: Diagnosis present

## 2012-09-23 DIAGNOSIS — J9601 Acute respiratory failure with hypoxia: Secondary | ICD-10-CM

## 2012-09-23 HISTORY — DX: Chronic obstructive pulmonary disease, unspecified: J44.9

## 2012-09-23 LAB — BASIC METABOLIC PANEL
Calcium: 9.3 mg/dL (ref 8.4–10.5)
GFR calc Af Amer: >90 mL/min (ref >90)
GFR calc non Af Amer: >90 mL/min (ref >90)
Potassium: 3.7 mEq/L (ref 3.5–5.1)
Sodium: 134 mEq/L — ABNORMAL LOW (ref 135–145)

## 2012-09-23 LAB — TROPONIN I
Troponin I: <0.3 ng/mL (ref <0.30)
Troponin I: <0.3 ng/mL (ref <0.30)

## 2012-09-23 LAB — CBC WITH DIFFERENTIAL/PLATELET
Basophils Absolute: 0 10*3/uL (ref 0.0–0.1)
Basophils Relative: 1 % (ref 0–1)
Eosinophils Absolute: 0.2 10*3/uL (ref 0.0–0.7)
Eosinophils Relative: 3 % (ref 0–5)
Lymphs Abs: 2.3 10*3/uL (ref 0.7–4.0)
MCH: 34.1 pg — ABNORMAL HIGH (ref 26.0–34.0)
MCHC: 35.5 g/dL (ref 30.0–36.0)
MCV: 95.9 fL (ref 78.0–100.0)
Neutrophils Relative %: 53 % (ref 43–77)
Platelets: 188 10*3/uL (ref 150–400)
RBC: 4.14 MIL/uL — ABNORMAL LOW (ref 4.22–5.81)
RDW: 12.8 % (ref 11.5–15.5)

## 2012-09-23 LAB — ETHANOL: Alcohol, Ethyl (B): 167 mg/dL — ABNORMAL HIGH (ref 0–11)

## 2012-09-23 MED ORDER — LORAZEPAM 1 MG PO TABS
1.0000 mg | ORAL_TABLET | Freq: Four times a day (QID) | ORAL | Status: DC | PRN
  Administered 2012-09-23 – 2012-09-24 (×3): 1 mg via ORAL
  Filled 2012-09-23 (×3): qty 1

## 2012-09-23 MED ORDER — IPRATROPIUM BROMIDE 0.02 % IN SOLN: 0.5000 mg | Freq: Once | RESPIRATORY_TRACT | Status: AC

## 2012-09-23 MED ORDER — DOCUSATE SODIUM 100 MG PO CAPS
100.0000 mg | ORAL_CAPSULE | Freq: Two times a day (BID) | ORAL | Status: DC
  Administered 2012-09-23 – 2012-09-25 (×5): 100 mg via ORAL
  Filled 2012-09-23 (×6): qty 1

## 2012-09-23 MED ORDER — IPRATROPIUM BROMIDE 0.02 % IN SOLN
0.5000 mg | RESPIRATORY_TRACT | Status: DC
  Administered 2012-09-23 (×3): 0.5 mg via RESPIRATORY_TRACT
  Filled 2012-09-23 (×3): qty 2.5

## 2012-09-23 MED ORDER — IPRATROPIUM BROMIDE 0.02 % IN SOLN
RESPIRATORY_TRACT | Status: AC
  Administered 2012-09-23: 0.5 mg via RESPIRATORY_TRACT
  Filled 2012-09-23: qty 2.5

## 2012-09-23 MED ORDER — METHYLPREDNISOLONE SODIUM SUCC 40 MG IJ SOLR
40.0000 mg | INTRAMUSCULAR | Status: DC
  Administered 2012-09-23 (×2): 40 mg via INTRAVENOUS
  Filled 2012-09-23 (×7): qty 1

## 2012-09-23 MED ORDER — ALBUTEROL (5 MG/ML) CONTINUOUS INHALATION SOLN
10.0000 mg/h | INHALATION_SOLUTION | Freq: Once | RESPIRATORY_TRACT | Status: AC
  Administered 2012-09-23: 10 mg/h via RESPIRATORY_TRACT

## 2012-09-23 MED ORDER — ALBUTEROL SULFATE (5 MG/ML) 0.5% IN NEBU
2.5000 mg | INHALATION_SOLUTION | RESPIRATORY_TRACT | Status: DC | PRN
  Administered 2012-09-23: 2.5 mg via RESPIRATORY_TRACT
  Filled 2012-09-23: qty 0.5

## 2012-09-23 MED ORDER — GI COCKTAIL ~~LOC~~
30.0000 mL | Freq: Once | ORAL | Status: AC
  Administered 2012-09-23: 30 mL via ORAL
  Filled 2012-09-23: qty 30

## 2012-09-23 MED ORDER — LORAZEPAM 2 MG/ML IJ SOLN: 0.0000 mg | Freq: Two times a day (BID) | INTRAMUSCULAR | Status: DC

## 2012-09-23 MED ORDER — METHYLPREDNISOLONE SODIUM SUCC 125 MG IJ SOLR
125.0000 mg | Freq: Once | INTRAMUSCULAR | Status: AC
  Administered 2012-09-23: 125 mg via INTRAVENOUS
  Filled 2012-09-23: qty 2

## 2012-09-23 MED ORDER — FOLIC ACID 1 MG PO TABS
1.0000 mg | ORAL_TABLET | Freq: Every day | ORAL | Status: DC
  Administered 2012-09-23 – 2012-09-25 (×3): 1 mg via ORAL
  Filled 2012-09-23 (×3): qty 1

## 2012-09-23 MED ORDER — HEPARIN SODIUM (PORCINE) 5000 UNIT/ML IJ SOLN
5000.0000 [IU] | Freq: Three times a day (TID) | INTRAMUSCULAR | Status: DC
  Administered 2012-09-23: 5000 [IU] via SUBCUTANEOUS
  Filled 2012-09-23 (×4): qty 1

## 2012-09-23 MED ORDER — INFLUENZA VAC SPLIT QUAD 0.5 ML IM SUSP
0.5000 mL | Freq: Once | INTRAMUSCULAR | Status: AC
  Administered 2012-09-23: 09:00:00 0.5 mL via INTRAMUSCULAR
  Filled 2012-09-23: qty 0.5

## 2012-09-23 MED ORDER — METOPROLOL SUCCINATE ER 25 MG PO TB24
25.0000 mg | ORAL_TABLET | Freq: Every day | ORAL | Status: DC
Start: 2012-09-23 — End: 2012-09-23
  Administered 2012-09-23: 25 mg via ORAL
  Filled 2012-09-23: qty 1

## 2012-09-23 MED ORDER — TIOTROPIUM BROMIDE MONOHYDRATE 18 MCG IN CAPS
18.0000 ug | ORAL_CAPSULE | Freq: Every day | RESPIRATORY_TRACT | Status: DC
  Administered 2012-09-23: 18 ug via RESPIRATORY_TRACT
  Filled 2012-09-23: qty 5

## 2012-09-23 MED ORDER — METHYLPREDNISOLONE SODIUM SUCC 125 MG IJ SOLR
60.0000 mg | Freq: Two times a day (BID) | INTRAMUSCULAR | Status: DC
  Administered 2012-09-23 – 2012-09-24 (×3): 60 mg via INTRAVENOUS
  Filled 2012-09-23 (×6): qty 0.96

## 2012-09-23 MED ORDER — IRBESARTAN 150 MG PO TABS
150.0000 mg | ORAL_TABLET | Freq: Every day | ORAL | Status: DC
  Administered 2012-09-23 – 2012-09-25 (×3): 150 mg via ORAL
  Filled 2012-09-23 (×3): qty 1

## 2012-09-23 MED ORDER — VITAMIN B-1 100 MG PO TABS
100.0000 mg | ORAL_TABLET | Freq: Every day | ORAL | Status: DC
  Administered 2012-09-23 – 2012-09-25 (×3): 100 mg via ORAL
  Filled 2012-09-23 (×3): qty 1

## 2012-09-23 MED ORDER — ADULT MULTIVITAMIN W/MINERALS CH
1.0000 | ORAL_TABLET | Freq: Every day | ORAL | Status: DC
  Administered 2012-09-23 – 2012-09-25 (×3): 1 via ORAL
  Filled 2012-09-23 (×3): qty 1

## 2012-09-23 MED ORDER — LEVALBUTEROL HCL 1.25 MG/0.5ML IN NEBU
1.2500 mg | INHALATION_SOLUTION | RESPIRATORY_TRACT | Status: DC | PRN
  Administered 2012-09-23 – 2012-09-24 (×3): 1.25 mg via RESPIRATORY_TRACT
  Filled 2012-09-23: qty 0.5

## 2012-09-23 MED ORDER — SODIUM CHLORIDE 0.9 % IV SOLN
80.0000 mg | Freq: Once | INTRAVENOUS | Status: AC
  Administered 2012-09-23: 80 mg via INTRAVENOUS
  Filled 2012-09-23: qty 80

## 2012-09-23 MED ORDER — ONDANSETRON HCL 4 MG/2ML IJ SOLN: 4.0000 mg | Freq: Four times a day (QID) | INTRAMUSCULAR | Status: DC | PRN

## 2012-09-23 MED ORDER — PANTOPRAZOLE SODIUM 40 MG PO TBEC
40.0000 mg | DELAYED_RELEASE_TABLET | Freq: Every day | ORAL | Status: DC
  Administered 2012-09-23 – 2012-09-25 (×3): 40 mg via ORAL
  Filled 2012-09-23 (×3): qty 1

## 2012-09-23 MED ORDER — ONDANSETRON HCL 4 MG PO TABS: 4.0000 mg | ORAL_TABLET | Freq: Four times a day (QID) | ORAL | Status: DC | PRN

## 2012-09-23 MED ORDER — DEXTROSE-NACL 5-0.9 % IV SOLN
INTRAVENOUS | Status: DC
  Administered 2012-09-23 – 2012-09-24 (×3): via INTRAVENOUS

## 2012-09-23 MED ORDER — BIOTENE DRY MOUTH MT LIQD
15.0000 mL | Freq: Two times a day (BID) | OROMUCOSAL | Status: DC
  Administered 2012-09-23 – 2012-09-25 (×4): 15 mL via OROMUCOSAL

## 2012-09-23 MED ORDER — LEVOFLOXACIN IN D5W 750 MG/150ML IV SOLN
750.0000 mg | INTRAVENOUS | Status: DC
  Administered 2012-09-23 – 2012-09-25 (×3): 750 mg via INTRAVENOUS
  Filled 2012-09-23 (×3): qty 150

## 2012-09-23 MED ORDER — ALBUTEROL SULFATE (5 MG/ML) 0.5% IN NEBU
5.0000 mg | INHALATION_SOLUTION | Freq: Once | RESPIRATORY_TRACT | Status: AC
  Administered 2012-09-23: 5 mg via RESPIRATORY_TRACT

## 2012-09-23 MED ORDER — METOPROLOL SUCCINATE ER 50 MG PO TB24
50.0000 mg | ORAL_TABLET | Freq: Every day | ORAL | Status: DC
  Administered 2012-09-24 – 2012-09-25 (×2): 50 mg via ORAL
  Filled 2012-09-23 (×2): qty 1

## 2012-09-23 MED ORDER — ALBUTEROL SULFATE (5 MG/ML) 0.5% IN NEBU: 2.5000 mg | INHALATION_SOLUTION | Freq: Once | RESPIRATORY_TRACT | Status: DC

## 2012-09-23 MED ORDER — LEVALBUTEROL HCL 1.25 MG/0.5ML IN NEBU
1.2500 mg | INHALATION_SOLUTION | RESPIRATORY_TRACT | Status: DC
  Administered 2012-09-23: 1.25 mg via RESPIRATORY_TRACT
  Filled 2012-09-23 (×7): qty 0.5

## 2012-09-23 MED ORDER — LORAZEPAM 2 MG/ML IJ SOLN
0.0000 mg | Freq: Four times a day (QID) | INTRAMUSCULAR | Status: AC
  Administered 2012-09-23: 2 mg via INTRAVENOUS
  Administered 2012-09-23: 19:00:00 4 mg via INTRAVENOUS
  Administered 2012-09-24 (×3): 2 mg via INTRAVENOUS
  Administered 2012-09-25: 1 mg via INTRAVENOUS
  Filled 2012-09-23 (×3): qty 1
  Filled 2012-09-23: qty 2
  Filled 2012-09-23: qty 1

## 2012-09-23 MED ORDER — ALBUTEROL SULFATE (5 MG/ML) 0.5% IN NEBU
5.0000 mg | INHALATION_SOLUTION | Freq: Once | RESPIRATORY_TRACT | Status: AC
  Administered 2012-09-23: 5 mg via RESPIRATORY_TRACT
  Filled 2012-09-23: qty 1

## 2012-09-23 MED ORDER — ALBUTEROL (5 MG/ML) CONTINUOUS INHALATION SOLN
INHALATION_SOLUTION | RESPIRATORY_TRACT | Status: AC
  Filled 2012-09-23: qty 20

## 2012-09-23 MED ORDER — MORPHINE SULFATE 2 MG/ML IJ SOLN
2.0000 mg | INTRAMUSCULAR | Status: DC | PRN
  Administered 2012-09-23 – 2012-09-25 (×14): 2 mg via INTRAVENOUS
  Filled 2012-09-23 (×14): qty 1

## 2012-09-23 MED ORDER — PNEUMOCOCCAL VAC POLYVALENT 25 MCG/0.5ML IJ INJ
0.5000 mL | INJECTION | Freq: Once | INTRAMUSCULAR | Status: DC
  Filled 2012-09-23: qty 0.5

## 2012-09-23 MED ORDER — THIAMINE HCL 100 MG/ML IJ SOLN
100.0000 mg | Freq: Every day | INTRAMUSCULAR | Status: DC
  Filled 2012-09-23 (×3): qty 1

## 2012-09-23 MED ORDER — SODIUM CHLORIDE 0.9 % IJ SOLN
3.0000 mL | Freq: Two times a day (BID) | INTRAMUSCULAR | Status: DC
  Administered 2012-09-23 – 2012-09-24 (×3): 3 mL via INTRAVENOUS

## 2012-09-23 MED ORDER — LORAZEPAM 2 MG/ML IJ SOLN
1.0000 mg | Freq: Four times a day (QID) | INTRAMUSCULAR | Status: DC | PRN
  Filled 2012-09-23: qty 1

## 2012-09-23 MED ORDER — PNEUMOCOCCAL VAC POLYVALENT 25 MCG/0.5ML IJ INJ
0.5000 mL | INJECTION | INTRAMUSCULAR | Status: AC
  Administered 2012-09-23: 09:00:00 0.5 mL via INTRAMUSCULAR
  Filled 2012-09-23: qty 0.5

## 2012-09-23 MED ORDER — IPRATROPIUM BROMIDE 0.02 % IN SOLN
0.5000 mg | RESPIRATORY_TRACT | Status: DC | PRN
  Administered 2012-09-23 – 2012-09-24 (×2): 0.5 mg via RESPIRATORY_TRACT
  Filled 2012-09-23 (×2): qty 2.5

## 2012-09-23 MED ORDER — ALBUTEROL SULFATE (5 MG/ML) 0.5% IN NEBU
2.5000 mg | INHALATION_SOLUTION | Freq: Four times a day (QID) | RESPIRATORY_TRACT | Status: DC
  Administered 2012-09-23: 2.5 mg via RESPIRATORY_TRACT
  Filled 2012-09-23: qty 0.5

## 2012-09-23 NOTE — Progress Notes (Signed)
Placed patient on Auto mode, large full face mask, and 4lpm o2 bleed in.  Patient has been instructed to call RN if he wants to take the mask off since his o2 is through the mask.  Patient is tolerating well.

## 2012-09-23 NOTE — Progress Notes (Addendum)
Patient ID: Jesse Hartman, male   DOB: 11/25/1960, 52 y.o.   MRN: [id number]  Addendum to admission note done today 09/23/2012.  52 year old male with past medical history of asthma and COPD, active smoker and history of alcohol abuse who presented to [facility] ED with asthma exacerbation. Patient was found to have alcohol level of 278. Patient was seen and examined at bedside. Patient reports having some shortness of breath and associated chest pain. No overnight events.  Assessment and plan:  Principal problem: *Acute hypoxic respiratory failure - Secondary to asthma and COPD exacerbation - Change nebulizer treatments instead of albuterol use Xopenex every 2 hours as needed as well as every 4 hours scheduled. Add Atrovent every 2 hours as needed as well as every 4 hours scheduled. - Change Solu-Medrol to 60 mg twice daily IV  Active problems: Acute alcohol intoxication - CIWA ordered - monitor for withdrawals  Manson Passey [facility] [phone]

## 2012-09-23 NOTE — Care Management Note (Signed)
  Page 1 of 1   09/23/2012     11:48:26 AM   CARE MANAGEMENT NOTE 09/23/2012  Patient:  Jesse Hartman, Jesse Hartman   Account Number:  [id number]  Date Initiated:  09/23/2012  Documentation initiated by:  Colleen Can  Subjective/Objective Assessment:   dx copd excerbation, etoh intoxication     Action/Plan:   From home   Anticipated DC Date:  09/26/2012   Anticipated DC Plan:  HOME/SELF CARE  In-house referral  Financial Counselor      DC Planning Services  CM consult      Choice offered to / List presented to:             Status of service:  In process, will continue to follow Medicare Important Message given?   (If response is "NO", the following Medicare IM given date fields will be blank) Date Medicare IM given:   Date Additional Medicare IM given:    Discharge Disposition:    Per UR Regulation:  Reviewed for med. necessity/level of care/duration of stay  If discussed at Long Length of Stay Meetings, dates discussed:    Comments:  09/23/2012 Colleen Can BSN RN CCM [phone] Pt is currently on CIWA protocol.

## 2012-09-23 NOTE — Progress Notes (Addendum)
Assumed care of pt at this time.  Agree with previously charted assessment.  Denies questions or concerns at this time.  States "the morphine helped earlier".  Will continue to monitor.  Ardyth Gal, RN 09/23/2012

## 2012-09-23 NOTE — ED Notes (Signed)
Attempted to call report to [place], RN on 4 west. RN unavailable to take report at this time. Number left for call back

## 2012-09-23 NOTE — H&P (Signed)
[facility] History and Physical  Jesse Hartman YNW:[id number] DOB: Apr 23, 1960    PCP:   Eartha Inch, MD   Chief Complaint: shortness of breath.  HPI: Jesse Hartman is an 52 y.o. male with hx of GERD, esosphagitis, COPD, HTN, and recent diagnosis of sleep apnea, presents to the ER with shortness of breath, wheezing and chest tightness.  He was at a cook out, and there was quite a bit of smokes.  He denied any fever, chills, or productive coughs.  He has been smoking cigarettes, and continue to drink significant alcohol daily (12 beers daily).  In the ER, he was in mild respiratory distress and was treated with several rounds of neb Tx, along with IV steroid and oxygen supplement.  He felt better, but still wheezing significantly. Evaluation showed CXR without infiltrate, normal WBC, but he was intoxicated.  His EKG showed no acute ischemic changes, and his renal fx tests were normal.  Hospitalist was asked to admit him for ETOH abuse, and COPD exacerbation.    Rewiew of Systems:  Constitutional: Negative for malaise, fever and chills. No significant weight loss or weight gain Eyes: Negative for eye pain, redness and discharge, diplopia, visual changes, or flashes of light. ENMT: Negative for ear pain, hoarseness, nasal congestion, sinus pressure and sore throat. No headaches; tinnitus, drooling, or problem swallowing. Cardiovascular: Negative for palpitations, diaphoresis,  and peripheral edema. ; No orthopnea, PND Respiratory: Negative for cough, hemoptysis, wheezing and stridor. No pleuritic chestpain. Gastrointestinal: Negative for nausea, vomiting, diarrhea, constipation, abdominal pain, melena, blood in stool, hematemesis, jaundice and rectal bleeding.    Genitourinary: Negative for frequency, dysuria, incontinence,flank pain and hematuria; Musculoskeletal: Negative for back pain and neck pain. Negative for swelling and trauma.;  Skin: . Negative for pruritus, rash, abrasions,  bruising and skin lesion.; ulcerations Neuro: Negative for headache, lightheadedness and neck stiffness. Negative for weakness, altered level of consciousness , altered mental status, extremity weakness, burning feet, involuntary movement, seizure and syncope.  Psych: negative for anxiety, depression, insomnia, tearfulness, panic attacks, hallucinations, paranoia, suicidal or homicidal ideation    Past Medical History  Diagnosis Date  . Hypertension   . Barrett's syndrome   . Arthritis   . Hypokalemia   . Hyponatremia   . Esophagitis   . GERD (gastroesophageal reflux disease)   . DVT (deep venous thrombosis)   . COPD (chronic obstructive pulmonary disease)     Past Surgical History  Procedure Laterality Date  . Back surgery    . Rhinoplasty    . Bunionectomy    . Appendectomy    . Esophagogastroduodenoscopy  08/03/2011    Procedure: ESOPHAGOGASTRODUODENOSCOPY (EGD);  Surgeon: Rachael Fee, MD;  Location: [facility];  Service: Endoscopy;  Laterality: N/A;  . Finger surgery      lt middle    Medications:  HOME MEDS: Prior to Admission medications   Medication Sig Start Date End Date Taking? Authorizing Provider  albuterol (PROVENTIL HFA;VENTOLIN HFA) 108 (90 BASE) MCG/ACT inhaler Inhale 2 puffs into the lungs every 6 (six) hours as needed for wheezing.   Yes Historical Provider, MD  clonazePAM (KLONOPIN) 1 MG tablet Take 1 mg by mouth 3 (three) times daily as needed for anxiety.   Yes Historical Provider, MD  metoprolol succinate (TOPROL-XL) 25 MG 24 hr tablet Take 25 mg by mouth daily.   Yes Historical Provider, MD  olmesartan (BENICAR) 20 MG tablet Take 20 mg by mouth daily.   Yes Historical Provider, MD  omeprazole (  PRILOSEC) 40 MG capsule Take 40 mg by mouth daily.   Yes Historical Provider, MD  tiotropium (SPIRIVA) 18 MCG inhalation capsule Place 18 mcg into inhaler and inhale daily.   Yes Historical Provider, MD     Allergies:  No Known Allergies  Social  History:   reports that he has been smoking Cigarettes.  He has a 30 pack-year smoking history. He does not have any smokeless tobacco history on file. He reports that  drinks alcohol. He reports that he does not use illicit drugs.  Family History: Family History  Problem Relation Age of Onset  . Diabetes Maternal Grandfather      Physical Exam: Filed Vitals:   09/23/12 0200 09/23/12 0226 09/23/12 0300 09/23/12 0400  BP:      Pulse: 86  86 94  Temp:      TempSrc:      Resp: 16  15 19   SpO2: 99% 91% 95% 91%   Blood pressure 143/95, pulse 94, temperature 98.4 F (36.9 C), temperature source Oral, resp. rate 19, SpO2 91.00%.  GEN:  Pleasant patient lying in the stretcher in no acute distress; cooperative with exam. PSYCH:  alert and oriented x4; does not appear anxious or depressed; affect is appropriate. HEENT: Mucous membranes pink and anicteric; PERRLA; EOM intact; no cervical lymphadenopathy nor thyromegaly or carotid bruit; no JVD; There were no stridor. Neck is very supple. Breasts:: Not examined CHEST WALL: No tenderness CHEST: Normal respiration, tight wheezing in both lungs without rales. HEART: Regular rate and rhythm.  There are no murmur, rub, or gallops.   BACK: No kyphosis or scoliosis; no CVA tenderness ABDOMEN: soft and non-tender; no masses, no organomegaly, normal abdominal bowel sounds; no pannus; no intertriginous candida. There is no rebound and no distention. Rectal Exam: Not done EXTREMITIES: No bone or joint deformity; age-appropriate arthropathy of the hands and knees; no edema; no ulcerations.  There is no calf tenderness. Genitalia: not examined PULSES: 2+ and symmetric SKIN: Normal hydration no rash or ulceration CNS: Cranial nerves 2-12 grossly intact no focal lateralizing neurologic deficit.  Speech is fluent; uvula elevated with phonation, facial symmetry and tongue midline. DTR are normal bilaterally, cerebella exam is intact, barbinski is negative  and strengths are equaled bilaterally.  No sensory loss.   Labs on Admission:  Basic Metabolic Panel:  Recent Labs Lab 09/23/12 0137  NA 134*  K 3.7  CL 95*  CO2 28  GLUCOSE 103*  BUN 4*  CREATININE 0.96  CALCIUM 9.3   Liver Function Tests: No results found for this basename: AST, ALT, ALKPHOS, BILITOT, PROT, ALBUMIN,  in the last 168 hours No results found for this basename: LIPASE, AMYLASE,  in the last 168 hours No results found for this basename: AMMONIA,  in the last 168 hours CBC:  Recent Labs Lab 09/23/12 0137  WBC 6.9  NEUTROABS 3.7  HGB 14.1  HCT 39.7  MCV 95.9  PLT 188   Cardiac Enzymes:  Recent Labs Lab 09/23/12 0137  TROPONINI <0.30    CBG: No results found for this basename: GLUCAP,  in the last 168 hours   Radiological Exams on Admission: Dg Chest Port 1 View  09/23/2012   CLINICAL DATA:  Shortness of breath  EXAM: PORTABLE CHEST - 1 VIEW  COMPARISON:  08/05/2012  FINDINGS: No cardiomegaly. Convexity of the upper right mediastinum unchanged from priors, especially when correlating with chest CT 05/06/2009. No infiltrate, edema, effusion, or pneumothorax. Remote mid left clavicle fracture.  IMPRESSION:  No active disease.   Electronically Signed   By: Tiburcio Pea   On: 09/23/2012 02:04    EKG: Independently reviewed. NSR with no acute ST T changes.  Assessment/Plan Present on Admission:  . Alcohol intoxication . GERD . Esophagitis . Alcohol abuse . HTN (hypertension)  PLAN: Will admit him for COPD exacerbation.  Will give neb tx along with continue IV steroid.  Will add antibiotics for inpatient tx of COPD exacerbation.  He likely will need to be discharged on steroid inhaler.  For his alcohol abuse, will place him on CIWA protocol.  I suspect he does have esophagitis and GERD, and it is causing him chest discomfort.  I will be prudent and cycle his troponins, but it is unlikely to be a cardiac cause.  I have encouraged him to quit smoking  and reduce alcohol use.  For his HTN, will continue with low dose betablocker and benacar.  He is stable, full code, and will be admitted to [facility] service. Thank you for allowing me to particpate in his care.   Other plans as per orders.  Code Status: FULL Unk Lightning, MD. [facility] Pager [phone] 7pm to 7am.  09/23/2012, 5:01 AM

## 2012-09-23 NOTE — ED Provider Notes (Signed)
CSN: [id number]     Arrival date & time 09/23/12  0115 History   First MD Initiated Contact with Patient 09/23/12 0122     Chief Complaint  Patient presents with  . Shortness of Breath   (Consider location/radiation/quality/duration/timing/severity/associated sxs/prior Treatment) HPI 52 year old man presents to emergency department accompanied by his daughter with complaint of shortness of breath and wheezing.  Patient reports he has a history of COPD.  He normally smokes a pack a day, but reports over the last week.  He has only smoked one to 2 cigarettes a day do to increased cough and wheezing.  Patient takes albuterol.  Tonight he was out with friends, used someone's rescue inhaler due to increased wheezing, but still could not catch his breath.  Patient also reports history of sleep apnea diagnosed about 3 weeks ago, but does not have the money to pay for CPAP.  Patient presented somnolent, and initially confused.  It is reported heavy alcohol use. Past Medical History  Diagnosis Date  . Hypertension   . Barrett's syndrome   . Arthritis   . Hypokalemia   . Hyponatremia   . Esophagitis   . GERD (gastroesophageal reflux disease)   . DVT (deep venous thrombosis)    Past Surgical History  Procedure Laterality Date  . Back surgery    . Rhinoplasty    . Bunionectomy    . Appendectomy    . Esophagogastroduodenoscopy  08/03/2011    Procedure: ESOPHAGOGASTRODUODENOSCOPY (EGD);  Surgeon: Rachael Fee, MD;  Location: [facility];  Service: Endoscopy;  Laterality: N/A;  . Finger surgery      lt middle   Family History  Problem Relation Age of Onset  . Diabetes Maternal Grandfather    History  Substance Use Topics  . Smoking status: Current Every Day Smoker -- 0.50 packs/day for 30 years    Types: Cigarettes  . Smokeless tobacco: Not on file  . Alcohol Use: Yes    Review of Systems  Unable to perform ROS: Acuity of condition    Allergies  Review of patient's allergies  indicates no known allergies.  Home Medications   Current Outpatient Rx  Name  Route  Sig  Dispense  Refill  . albuterol (PROVENTIL HFA;VENTOLIN HFA) 108 (90 BASE) MCG/ACT inhaler   Inhalation   Inhale 2 puffs into the lungs every 6 (six) hours as needed for wheezing.         . clonazePAM (KLONOPIN) 1 MG tablet   Oral   Take 1 mg by mouth 3 (three) times daily as needed for anxiety.         . metoprolol succinate (TOPROL-XL) 25 MG 24 hr tablet   Oral   Take 25 mg by mouth daily.         Marland Kitchen olmesartan (BENICAR) 20 MG tablet   Oral   Take 20 mg by mouth daily.         Marland Kitchen omeprazole (PRILOSEC) 40 MG capsule   Oral   Take 40 mg by mouth daily.         Marland Kitchen tiotropium (SPIRIVA) 18 MCG inhalation capsule   Inhalation   Place 18 mcg into inhaler and inhale daily.          BP 143/95  Pulse 82  Temp(Src) 98.4 F (36.9 C) (Oral)  Resp 20  SpO2 97% Physical Exam  Nursing note and vitals reviewed. Constitutional: He appears well-developed and well-nourished. He appears distressed.  HENT:  Head: Normocephalic and atraumatic.  Nose: Nose normal.  Mouth/Throat: Oropharynx is clear and moist.  Eyes: Conjunctivae and EOM are normal. Pupils are equal, round, and reactive to light.  Neck: Normal range of motion. Neck supple. No JVD present. No tracheal deviation present. No thyromegaly present.  Cardiovascular: Normal rate, regular rhythm, normal heart sounds and intact distal pulses.  Exam reveals no gallop and no friction rub.   No murmur heard. Pulmonary/Chest: No stridor. He is in respiratory distress. He has wheezes. He has no rales. He exhibits no tenderness.  Abdominal: Soft. Bowel sounds are normal. He exhibits no distension and no mass. There is no tenderness. There is no rebound and no guarding.  Musculoskeletal: Normal range of motion. He exhibits no edema and no tenderness.  Lymphadenopathy:    He has no cervical adenopathy.  Neurological: He exhibits normal  muscle tone. Coordination normal.  Somnolent but arousable  Skin: Skin is warm and dry. No rash noted. No erythema. No pallor.  Psychiatric: He has a normal mood and affect. His behavior is normal. Judgment and thought content normal.    ED Course  Procedures (including critical care time) Labs Review Labs Reviewed  CBC WITH DIFFERENTIAL - Abnormal; Notable for the following:    RBC 4.14 (*)    MCH 34.1 (*)    All other components within normal limits  BASIC METABOLIC PANEL - Abnormal; Notable for the following:    Sodium 134 (*)    Chloride 95 (*)    Glucose, Bld 103 (*)    BUN 4 (*)    All other components within normal limits  ETHANOL - Abnormal; Notable for the following:    Alcohol, Ethyl (B) 167 (*)    All other components within normal limits  TROPONIN I   Imaging Review Dg Chest Port 1 View  09/23/2012   CLINICAL DATA:  Shortness of breath  EXAM: PORTABLE CHEST - 1 VIEW  COMPARISON:  08/05/2012  FINDINGS: No cardiomegaly. Convexity of the upper right mediastinum unchanged from priors, especially when correlating with chest CT 05/06/2009. No infiltrate, edema, effusion, or pneumothorax. Remote mid left clavicle fracture.  IMPRESSION: No active disease.   Electronically Signed   By: Tiburcio Pea   On: 09/23/2012 02:04    Date: 09/23/2012  Rate: 80  Rhythm: normal sinus rhythm  QRS Axis: normal  Intervals: normal  ST/T Wave abnormalities: normal  Conduction Disutrbances:none  Narrative Interpretation:   Old EKG Reviewed: unchanged    MDM   1. COPD exacerbation   2. Alcohol intoxication   3. Hypokalemia    52 year old male with COPD exacerbation.  After first treatment, patient with increased wheezing and cough, but better air exchange.  After second treatment, still with significant wheezing and cough.  His oxygen saturation to have normalized on room air.  Chest x-ray without infiltrate.  He has received Solu-Medrol.  We'll plan for our long neb treatment, and  reassessment.  3:42 AM He has completed 2 neb treatments, and one hour long treatment.  He still has significant end expiratory wheezes.  Patient on room air have sats of 85%.  Will discuss with hospitalist for admission.  This appears to be a COPD exacerbation with underlying sleep apnea contributing to his hypoxia.  Olivia Mackie, MD 09/23/12 [phone]

## 2012-09-23 NOTE — ED Notes (Signed)
Per pt report: pt began to feel short of breath.  Pt having wheezes.  Pt tried a friend's rescue inhaler w/out help.  ETOH use

## 2012-09-24 DIAGNOSIS — K2270 Barrett's esophagus without dysplasia: Secondary | ICD-10-CM

## 2012-09-24 DIAGNOSIS — J9600 Acute respiratory failure, unspecified whether with hypoxia or hypercapnia: Secondary | ICD-10-CM

## 2012-09-24 LAB — LIPASE, BLOOD: Lipase: 17 U/L (ref 11–59)

## 2012-09-24 LAB — PRO B NATRIURETIC PEPTIDE: Pro B Natriuretic peptide (BNP): 216.7 pg/mL — ABNORMAL HIGH (ref 0–125)

## 2012-09-24 MED ORDER — POLYETHYLENE GLYCOL 3350 17 G PO PACK
17.0000 g | PACK | Freq: Every day | ORAL | Status: DC | PRN
  Administered 2012-09-24: 23:00:00 17 g via ORAL
  Filled 2012-09-24: qty 1

## 2012-09-24 MED ORDER — IPRATROPIUM BROMIDE 0.02 % IN SOLN
0.5000 mg | RESPIRATORY_TRACT | Status: DC
  Administered 2012-09-24 – 2012-09-25 (×6): 0.5 mg via RESPIRATORY_TRACT
  Filled 2012-09-24 (×6): qty 2.5

## 2012-09-24 MED ORDER — LEVALBUTEROL HCL 1.25 MG/0.5ML IN NEBU
1.2500 mg | INHALATION_SOLUTION | RESPIRATORY_TRACT | Status: DC
  Administered 2012-09-24 – 2012-09-25 (×6): 1.25 mg via RESPIRATORY_TRACT
  Filled 2012-09-24 (×13): qty 0.5

## 2012-09-24 NOTE — Progress Notes (Signed)
Clinical Social Work Department BRIEF PSYCHOSOCIAL ASSESSMENT 09/24/2012  Patient:  Jesse Hartman, Jesse Hartman     Account Number:  [id number]     Admit date:  09/23/2012  Clinical Social Worker:  Doroteo Glassman  Date/Time:  09/24/2012 03:32 PM  Referred by:  Physician  Date Referred:  09/24/2012 Referred for  Substance Abuse   Other Referral:   Interview type:  Patient Other interview type:   Pt's daughter at bedside    PSYCHOSOCIAL DATA Living Status:  FAMILY Admitted from facility:   Level of care:   Primary support name:  Jesse Hartman Primary support relationship to patient:  CHILD, ADULT Degree of support available:   strong    CURRENT CONCERNS Current Concerns  Post-Acute Placement   Other Concerns:    SOCIAL WORK ASSESSMENT / PLAN Met with Pt to discuss current SA.  Pt's daughter, Jesse Hartman, present.  Pt gave CSW permission to assess him with daughter present.    Pt reported that he sometimes drinks as much as 12 beers per day but that this isn't every day.  Pt stated that he has been to numerous treatment facilities, including IOP and inpt, as well as AA.  Pt stated that what has helped him the most has been 1:1 counseling for mental health.    Pt is amenable to 1:1 counseling at [facility], as he's been there before with his daughter.    CSW provided Pt with information on area SA tx resources, as well as [employer].    CSW thanked Pt for his time.    No further CSW needs identified.    CSW to sign off.   Assessment/plan status:  No Further Intervention Required Other assessment/ plan:   Information/referral to community resources:   [facility] IOP  [facility]  SA/mental health counselors.    PATIENT'S/FAMILY'S RESPONSE TO PLAN OF CARE: Pt knows that he needs counseling to address his underlying mental health problems.  Pt is agreeable to outpt mental health tx.    Pt and daughter thanked CSW for time and assistance.   Providence Crosby, LCSWA Clinical Social  Work [phone]

## 2012-09-24 NOTE — Progress Notes (Signed)
The patient was asleep during the midnight CIWA screening. Will continue to monitor the patient.

## 2012-09-24 NOTE — Progress Notes (Addendum)
[facility] PROGRESS NOTE  Jesse Hartman AOZ:[id number] DOB: 11/24/1960 DOA: 09/23/2012 PCP: Eartha Inch, MD  Brief narrative: 52 year old male with past medical history of asthma and COPD, active smoker and history of alcohol abuse who presented to [facility] ED with asthma exacerbation. Patient was found to have alcohol level of 278.  Assessment and plan:  Principal problem: *Acute hypoxic respiratory failure - Secondary to asthma and COPD exacerbation - Continue current nebulizer treatments with Xopenex every 2 hours as needed as well as every 4 hours scheduled. Continue Atrovent every 2 hours as needed as well as every 4 hours scheduled. - Continue Solu-Medrol to 60 mg twice daily IV - Continue Levaquin for pneumonia  Active problems: Community-acquired pneumonia - Continue Levaquin 750 mg daily Acute alcohol intoxication - CIWA ordered - monitor for withdrawals - Social work consult for help with alcohol abuse, referral  Hypertension - continue avapro and metoprolol GERD - continue omeprazole  Code Status: Full code Family Communication: Daughter at bedside Disposition Plan: Home in a.m.  Manson Passey, MD  [facility] Pager [phone]  If 7PM-7AM, please contact night-coverage [email or web address] Password [email or web address] 09/24/2012, 8:21 AM   LOS: 1 day   Consultants:  None  Procedures:  None  Antibiotics:  Levaquin 09/22/2012 -->  HPI/Subjective: Patient emotional this morning. Talked about how he wants to change his lifestyle and stop smoking and drinking.  Objective: Filed Vitals:   09/23/12 2107 09/23/12 2208 09/24/12 0228 09/24/12 0600  BP: 152/80   144/73  Pulse: 99   57  Temp: 97.8 F (36.6 C)   97.5 F (36.4 C)  TempSrc: Oral   Oral  Resp: 20   20  Height:      Weight:      SpO2: 99% 94% 90% 90%    Intake/Output Summary (Last 24 hours) at 09/24/12 4132 Last data filed at 09/24/12 0300  Gross per 24 hour  Intake 1997.5 ml  Output   2375 ml   Net -377.5 ml    Exam:   General:  Pt is alert, follows commands appropriately, not in acute distress  Cardiovascular: Regular rate and rhythm, S1/S2, no murmurs, no rubs, no gallops  Respiratory: Mild wheezing in upper lung lobes, no rhonchi  Abdomen: Soft, non tender, non distended, bowel sounds present, no guarding  Extremities: Lower extremity pitting +1 edema, pulses DP and PT palpable bilaterally  Neuro: Grossly nonfocal  Data Reviewed: Basic Metabolic Panel:  Recent Labs Lab 09/23/12 0137  NA 134*  K 3.7  CL 95*  CO2 28  GLUCOSE 103*  BUN 4*  CREATININE 0.96  CALCIUM 9.3   Liver Function Tests: No results found for this basename: AST, ALT, ALKPHOS, BILITOT, PROT, ALBUMIN,  in the last 168 hours  Recent Labs Lab 09/24/12 0530  LIPASE 17   No results found for this basename: AMMONIA,  in the last 168 hours CBC:  Recent Labs Lab 09/23/12 0137  WBC 6.9  NEUTROABS 3.7  HGB 14.1  HCT 39.7  MCV 95.9  PLT 188   Cardiac Enzymes:  Recent Labs Lab 09/23/12 0137 09/23/12 0635  TROPONINI <0.30 <0.30   BNP: No components found with this basename: POCBNP,  CBG: No results found for this basename: GLUCAP,  in the last 168 hours  No results found for this or any previous visit (from the past 240 hour(s)).   Studies: Dg Chest Port 1 View 09/23/2012    IMPRESSION: No active disease.   Electronically Signed   By: Christiane Ha  Watts   On: 09/23/2012 02:04    Scheduled Meds: . docusate sodium  100 mg Oral BID  . folic acid  1 mg Oral Daily  . ipratropium  0.5 mg Nebulization Q4H WA  . irbesartan  150 mg Oral Daily  . levalbuterol  1.25 mg Nebulization Q4H WA  . levofloxacin (LEVAQUIN)   750 mg Intravenous Q24H  . LORazepam  0-4 mg Intravenous Q6H     .  LORazepam  0-4 mg Intravenous Q12H  . methylPREDNISolone   60 mg Intravenous Q12H  . metoprolol succinate  50 mg Oral Daily  . multivitamin   1 tablet Oral Daily  . pantoprazole  40 mg Oral Daily   . thiamine  100 mg Oral Daily   Or  . thiamine  100 mg Intravenous Daily   Continuous Infusions: . dextrose 5 % and 0.9% NaCl 50 mL/hr at 09/24/12 0103

## 2012-09-25 DIAGNOSIS — I10 Essential (primary) hypertension: Secondary | ICD-10-CM

## 2012-09-25 MED ORDER — GUAIFENESIN ER 600 MG PO TB12
600.0000 mg | ORAL_TABLET | Freq: Two times a day (BID) | ORAL | Status: DC
  Administered 2012-09-25: 600 mg via ORAL
  Filled 2012-09-25 (×2): qty 1

## 2012-09-25 MED ORDER — HYDROCODONE-ACETAMINOPHEN 10-325 MG PO TABS: 1.0000 | ORAL_TABLET | Freq: Four times a day (QID) | ORAL | Status: DC | PRN

## 2012-09-25 MED ORDER — CLONAZEPAM 1 MG PO TABS: 1.0000 mg | ORAL_TABLET | Freq: Three times a day (TID) | ORAL | Status: DC | PRN

## 2012-09-25 MED ORDER — GUAIFENESIN ER 600 MG PO TB12: 1200.0000 mg | ORAL_TABLET | Freq: Two times a day (BID) | ORAL | Status: DC | PRN

## 2012-09-25 MED ORDER — ADULT MULTIVITAMIN W/MINERALS CH: 1.0000 | ORAL_TABLET | Freq: Every day | ORAL | Status: DC

## 2012-09-25 MED ORDER — ALBUTEROL SULFATE HFA 108 (90 BASE) MCG/ACT IN AERS: 2.0000 | INHALATION_SPRAY | Freq: Four times a day (QID) | RESPIRATORY_TRACT | Status: DC | PRN

## 2012-09-25 MED ORDER — LEVOFLOXACIN 750 MG PO TABS: 750.0000 mg | ORAL_TABLET | Freq: Every day | ORAL | Status: DC

## 2012-09-25 MED ORDER — TIOTROPIUM BROMIDE MONOHYDRATE 18 MCG IN CAPS: 18.0000 ug | ORAL_CAPSULE | Freq: Every day | RESPIRATORY_TRACT | Status: DC

## 2012-09-25 MED ORDER — DSS 100 MG PO CAPS: 100.0000 mg | ORAL_CAPSULE | Freq: Two times a day (BID) | ORAL | Status: DC | PRN

## 2012-09-25 MED ORDER — THIAMINE HCL 100 MG PO TABS: 100.0000 mg | ORAL_TABLET | Freq: Every day | ORAL | Status: DC

## 2012-09-25 MED ORDER — FOLIC ACID 1 MG PO TABS: 1.0000 mg | ORAL_TABLET | Freq: Every day | ORAL | Status: DC

## 2012-09-25 MED ORDER — PREDNISONE 50 MG PO TABS: ORAL_TABLET | ORAL | Status: DC

## 2012-09-25 NOTE — Discharge Summary (Signed)
Physician Discharge Summary  Jesse Hartman AVW:[id number] DOB: 26-Feb-1960 DOA: 09/23/2012  PCP: Eartha Inch, MD  Admit date: 09/23/2012 Discharge date: 09/25/2012  Recommendations for Outpatient Follow-up:  1. You were hospitalized for acute pancreatitis. You lipase level is now within normal limits. 2. Please abstain from alcohol use 3. Continue taking your blood pressure medications as you did 4. Continue prednisone 50 mg a day for 5 days 5. Start multivitamin, folic acid and thiamine for next 1 month. 6. Please take Levaquin for next 10 days on discharge for pneumonia  Discharge Diagnoses:  Principal Problem:   Acute respiratory failure with hypoxia Active Problems:   COPD exacerbation   GERD   Alcohol intoxication   HTN (hypertension)    Discharge Condition: medically stable for discharge home today  Diet recommendation: as tolerated, heart healthy   History of present illness:  52 year old male with past medical history of asthma and COPD, active smoker and history of alcohol abuse who presented to [facility] ED with asthma exacerbation. Patient was found to have alcohol level of 278.   Assessment and plan:   Principal problem:  *Acute hypoxic respiratory failure  - Secondary to asthma and COPD exacerbation  - Continue albuterol inhaler as needed at home and prednisone 50 mg a day for 5 days.  - use Levaquin for 10 days on discharge for pneumonia   Active problems:  Community-acquired pneumonia  - Continue Levaquin 750 mg daily for 10 more days Acute alcohol intoxication  - no withdrawals - SW provided referral to alcohol rehab Hypertension  - continue avapro and metoprolol  GERD - continue omeprazole    Code Status: Full code  Family Communication: Daughter at bedside   Manson Passey, MD  [facility]  Pager [phone]   Consultants:  None Procedures:  None Antibiotics:  Levaquin 09/22/2012 --> for 10 days on discharge    Signed:  Manson Passey,  MD  [facility] 09/25/2012, 7:45 AM  Pager #: [phone]    Discharge Exam: Filed Vitals:   09/25/12 0445  BP: 135/108  Pulse: 69  Temp: 98.6 F (37 C)  Resp: 24   Filed Vitals:   09/24/12 2018 09/24/12 2110 09/25/12 0445 09/25/12 0707  BP:  135/73 135/108   Pulse:  71 69   Temp:  97.8 F (36.6 C) 98.6 F (37 C)   TempSrc:  Oral Oral   Resp:  20 24   Height:      Weight:      SpO2: 96% 95% 95% 95%    General: Pt is alert, follows commands appropriately, not in acute distress Cardiovascular: Regular rate and rhythm, S1/S2 +, no murmurs, no rubs, no gallops Respiratory: Clear to auscultation bilaterally, no wheezing, no crackles, no rhonchi Abdominal: Soft, non tender, non distended, bowel sounds +, no guarding Extremities: no edema, no cyanosis, pulses palpable bilaterally DP and PT Neuro: Grossly nonfocal  Discharge Instructions  Discharge Orders   Future Orders Complete By Expires   Call MD for:  difficulty breathing, headache or visual disturbances  As directed    Call MD for:  persistant dizziness or light-headedness  As directed    Call MD for:  persistant nausea and vomiting  As directed    Call MD for:  severe uncontrolled pain  As directed    Diet - low sodium heart healthy  As directed    Discharge instructions  As directed    Comments:     1. You were hospitalized for acute pancreatitis.  You lipase level is now within normal limits. 2. Please abstain from alcohol use 3. Continue taking your blood pressure medications as you did 4. Continue prednisone 50 mg a day for 5 days 5. Start multivitamin, folic acid and thiamine for next 1 month. 6. Please take Levaquin for next 10 days on discharge for pneumonia   Increase activity slowly  As directed        Medication List         albuterol 108 (90 BASE) MCG/ACT inhaler  Commonly known as:  PROVENTIL HFA;VENTOLIN HFA  Inhale 2 puffs into the lungs every 6 (six) hours as needed for wheezing.      clonazePAM 1 MG tablet  Commonly known as:  KLONOPIN  Take 1 tablet (1 mg total) by mouth 3 (three) times daily as needed for anxiety.     DSS 100 MG Caps  Take 100 mg by mouth 2 (two) times daily as needed for constipation.     folic acid 1 MG tablet  Commonly known as:  FOLVITE  Take 1 tablet (1 mg total) by mouth daily.     guaiFENesin 600 MG 12 hr tablet  Commonly known as:  MUCINEX  Take 2 tablets (1,200 mg total) by mouth 2 (two) times daily as needed for congestion.     HYDROcodone-acetaminophen 10-325 MG per tablet  Commonly known as:  NORCO  Take 1 tablet by mouth every 6 (six) hours as needed for pain.     levofloxacin 750 MG tablet  Commonly known as:  LEVAQUIN  Take 1 tablet (750 mg total) by mouth daily.     metoprolol succinate 25 MG 24 hr tablet  Commonly known as:  TOPROL-XL  Take 25 mg by mouth daily.     multivitamin with minerals Tabs tablet  Take 1 tablet by mouth daily.     olmesartan 20 MG tablet  Commonly known as:  BENICAR  Take 20 mg by mouth daily.     omeprazole 40 MG capsule  Commonly known as:  PRILOSEC  Take 40 mg by mouth daily.     predniSONE 50 MG tablet  Commonly known as:  DELTASONE  Please take for 5 days     thiamine 100 MG tablet  Take 1 tablet (100 mg total) by mouth daily.     tiotropium 18 MCG inhalation capsule  Commonly known as:  SPIRIVA  Place 1 capsule (18 mcg total) into inhaler and inhale daily.           Follow-up Information   Follow up with BADGER,MICHAEL C, MD In 2 weeks.   Specialty:  Family Medicine   Contact information:   [address] [place] [address] [phone]        The results of significant diagnostics from this hospitalization (including imaging, microbiology, ancillary and laboratory) are listed below for reference.    Significant Diagnostic Studies: Dg Chest Port 1 View  09/23/2012   CLINICAL DATA:  Shortness of breath  EXAM: PORTABLE CHEST - 1 VIEW  COMPARISON:   08/05/2012  FINDINGS: No cardiomegaly. Convexity of the upper right mediastinum unchanged from priors, especially when correlating with chest CT 05/06/2009. No infiltrate, edema, effusion, or pneumothorax. Remote mid left clavicle fracture.  IMPRESSION: No active disease.   Electronically Signed   By: Tiburcio Pea   On: 09/23/2012 02:04    Microbiology: No results found for this or any previous visit (from the past 240 hour(s)).   Labs: Basic Metabolic Panel:  Recent Labs Lab 09/23/12 0137  NA 134*  K 3.7  CL 95*  CO2 28  GLUCOSE 103*  BUN 4*  CREATININE 0.96  CALCIUM 9.3   Liver Function Tests: No results found for this basename: AST, ALT, ALKPHOS, BILITOT, PROT, ALBUMIN,  in the last 168 hours  Recent Labs Lab 09/24/12 0530  LIPASE 17   No results found for this basename: AMMONIA,  in the last 168 hours CBC:  Recent Labs Lab 09/23/12 0137  WBC 6.9  NEUTROABS 3.7  HGB 14.1  HCT 39.7  MCV 95.9  PLT 188   Cardiac Enzymes:  Recent Labs Lab 09/23/12 0137 09/23/12 0635  TROPONINI <0.30 <0.30   BNP: BNP (last 3 results)  Recent Labs  09/24/12 0530  PROBNP 216.7*   CBG: No results found for this basename: GLUCAP,  in the last 168 hours  Time coordinating discharge: Over 30 minutes

## 2012-09-25 NOTE — Progress Notes (Signed)
Pt placed on Auto-CPAP via FFM with 4 LPM O2 bleed in, pt tolerating well at this time, RT to monitor and assess as needed.

## 2012-10-05 ENCOUNTER — Inpatient Hospital Stay (HOSPITAL_COMMUNITY): Payer: Medicaid Other

## 2012-10-05 ENCOUNTER — Inpatient Hospital Stay (HOSPITAL_COMMUNITY)
Admission: EM | Admit: 2012-10-05 | Discharge: 2012-10-07 | DRG: 072 | Disposition: A | Discharge status: Home / Self Care | Payer: Medicaid Other | Attending: Internal Medicine | Admitting: Internal Medicine

## 2012-10-05 ENCOUNTER — Emergency Department (HOSPITAL_COMMUNITY): Payer: Medicaid Other

## 2012-10-05 ENCOUNTER — Other Ambulatory Visit: Payer: Self-pay

## 2012-10-05 ENCOUNTER — Encounter (HOSPITAL_COMMUNITY): Payer: Self-pay

## 2012-10-05 DIAGNOSIS — J9601 Acute respiratory failure with hypoxia: Secondary | ICD-10-CM

## 2012-10-05 DIAGNOSIS — R0902 Hypoxemia: Secondary | ICD-10-CM | POA: Diagnosis present

## 2012-10-05 DIAGNOSIS — K2270 Barrett's esophagus without dysplasia: Secondary | ICD-10-CM | POA: Diagnosis present

## 2012-10-05 DIAGNOSIS — Z9119 Patient's noncompliance with other medical treatment and regimen: Secondary | ICD-10-CM

## 2012-10-05 DIAGNOSIS — F17200 Nicotine dependence, unspecified, uncomplicated: Secondary | ICD-10-CM | POA: Diagnosis present

## 2012-10-05 DIAGNOSIS — G9340 Encephalopathy, unspecified: Principal | ICD-10-CM | POA: Diagnosis present

## 2012-10-05 DIAGNOSIS — I10 Essential (primary) hypertension: Secondary | ICD-10-CM | POA: Diagnosis present

## 2012-10-05 DIAGNOSIS — F10929 Alcohol use, unspecified with intoxication, unspecified: Secondary | ICD-10-CM

## 2012-10-05 DIAGNOSIS — F1010 Alcohol abuse, uncomplicated: Secondary | ICD-10-CM | POA: Diagnosis present

## 2012-10-05 DIAGNOSIS — G4733 Obstructive sleep apnea (adult) (pediatric): Secondary | ICD-10-CM | POA: Diagnosis present

## 2012-10-05 DIAGNOSIS — Z79899 Other long term (current) drug therapy: Secondary | ICD-10-CM

## 2012-10-05 DIAGNOSIS — J441 Chronic obstructive pulmonary disease with (acute) exacerbation: Secondary | ICD-10-CM

## 2012-10-05 DIAGNOSIS — J449 Chronic obstructive pulmonary disease, unspecified: Secondary | ICD-10-CM

## 2012-10-05 DIAGNOSIS — J4489 Other specified chronic obstructive pulmonary disease: Secondary | ICD-10-CM | POA: Diagnosis present

## 2012-10-05 DIAGNOSIS — R079 Chest pain, unspecified: Secondary | ICD-10-CM

## 2012-10-05 DIAGNOSIS — Z91199 Patient's noncompliance with other medical treatment and regimen due to unspecified reason: Secondary | ICD-10-CM

## 2012-10-05 DIAGNOSIS — K219 Gastro-esophageal reflux disease without esophagitis: Secondary | ICD-10-CM | POA: Diagnosis present

## 2012-10-05 LAB — BLOOD GAS, ARTERIAL
Drawn by: 11249
FIO2: 0.21 %
O2 Saturation: 90.3 %
Patient temperature: 98.6
pH, Arterial: 7.449 (ref 7.350–7.450)

## 2012-10-05 LAB — CBC
HCT: 40.1 % (ref 39.0–52.0)
Hemoglobin: 13.7 g/dL (ref 13.0–17.0)
MCV: 96.4 fL (ref 78.0–100.0)
Platelets: 188 10*3/uL (ref 150–400)
RDW: 12.9 % (ref 11.5–15.5)
WBC: 10.1 10*3/uL (ref 4.0–10.5)

## 2012-10-05 LAB — POCT I-STAT, CHEM 8
BUN: 16 mg/dL (ref 6–23)
Chloride: 96 mEq/L (ref 96–112)
HCT: 42 % (ref 39.0–52.0)
Potassium: 3.9 mEq/L (ref 3.5–5.1)
Sodium: 133 mEq/L — ABNORMAL LOW (ref 135–145)

## 2012-10-05 LAB — RAPID URINE DRUG SCREEN, HOSP PERFORMED
Barbiturates: NOT DETECTED
Benzodiazepines: NOT DETECTED
Tetrahydrocannabinol: NOT DETECTED

## 2012-10-05 LAB — COMPREHENSIVE METABOLIC PANEL
ALT: 22 U/L (ref 0–53)
Albumin: 3.3 g/dL — ABNORMAL LOW (ref 3.5–5.2)
Alkaline Phosphatase: 38 U/L — ABNORMAL LOW (ref 39–117)
BUN: 17 mg/dL (ref 6–23)
CO2: 27 mEq/L (ref 19–32)
Chloride: 92 mEq/L — ABNORMAL LOW (ref 96–112)
GFR calc Af Amer: 87 mL/min — ABNORMAL LOW (ref >90)
GFR calc non Af Amer: 75 mL/min — ABNORMAL LOW (ref >90)
Glucose, Bld: 107 mg/dL — ABNORMAL HIGH (ref 70–99)
Potassium: 3.9 mEq/L (ref 3.5–5.1)
Sodium: 132 mEq/L — ABNORMAL LOW (ref 135–145)
Total Bilirubin: 0.4 mg/dL (ref 0.3–1.2)
Total Protein: 6.4 g/dL (ref 6.0–8.3)

## 2012-10-05 LAB — AMMONIA: Ammonia: 30 umol/L (ref 11–60)

## 2012-10-05 LAB — TROPONIN I
Troponin I: <0.3 ng/mL (ref <0.30)
Troponin I: <0.3 ng/mL (ref <0.30)

## 2012-10-05 LAB — LIPASE, BLOOD: Lipase: 38 U/L (ref 11–59)

## 2012-10-05 LAB — PRO B NATRIURETIC PEPTIDE: Pro B Natriuretic peptide (BNP): 74.9 pg/mL (ref 0–125)

## 2012-10-05 MED ORDER — ADULT MULTIVITAMIN W/MINERALS CH
1.0000 | ORAL_TABLET | Freq: Every day | ORAL | Status: DC
  Administered 2012-10-05 – 2012-10-07 (×3): 1 via ORAL
  Filled 2012-10-05 (×3): qty 1

## 2012-10-05 MED ORDER — LORAZEPAM 2 MG/ML IJ SOLN
0.0000 mg | Freq: Two times a day (BID) | INTRAMUSCULAR | Status: DC
  Administered 2012-10-07: 10:00:00 1 mg via INTRAVENOUS
  Filled 2012-10-05: qty 1

## 2012-10-05 MED ORDER — IPRATROPIUM BROMIDE 0.02 % IN SOLN
0.5000 mg | Freq: Four times a day (QID) | RESPIRATORY_TRACT | Status: DC
  Administered 2012-10-05: 0.5 mg via RESPIRATORY_TRACT
  Filled 2012-10-05: qty 2.5

## 2012-10-05 MED ORDER — IRBESARTAN 300 MG PO TABS
300.0000 mg | ORAL_TABLET | Freq: Every day | ORAL | Status: DC
  Filled 2012-10-05: qty 1

## 2012-10-05 MED ORDER — ALBUTEROL SULFATE (5 MG/ML) 0.5% IN NEBU
2.5000 mg | INHALATION_SOLUTION | RESPIRATORY_TRACT | Status: DC
  Administered 2012-10-05 – 2012-10-07 (×9): 2.5 mg via RESPIRATORY_TRACT
  Filled 2012-10-05 (×11): qty 0.5

## 2012-10-05 MED ORDER — NALOXONE HCL 0.4 MG/ML IJ SOLN
INTRAMUSCULAR | Status: AC
  Administered 2012-10-05: 0.4 mg via INTRAVENOUS
  Filled 2012-10-05: qty 1

## 2012-10-05 MED ORDER — METHYLPREDNISOLONE SODIUM SUCC 125 MG IJ SOLR
125.0000 mg | Freq: Once | INTRAMUSCULAR | Status: AC
  Administered 2012-10-05: 125 mg via INTRAVENOUS
  Filled 2012-10-05: qty 2

## 2012-10-05 MED ORDER — NICOTINE 21 MG/24HR TD PT24
21.0000 mg | MEDICATED_PATCH | Freq: Every day | TRANSDERMAL | Status: DC
  Administered 2012-10-05 – 2012-10-06 (×2): 21 mg via TRANSDERMAL
  Filled 2012-10-05 (×3): qty 1

## 2012-10-05 MED ORDER — ALBUTEROL SULFATE (5 MG/ML) 0.5% IN NEBU
5.0000 mg | INHALATION_SOLUTION | Freq: Once | RESPIRATORY_TRACT | Status: AC
  Administered 2012-10-05: 5 mg via RESPIRATORY_TRACT
  Filled 2012-10-05: qty 1

## 2012-10-05 MED ORDER — VITAMIN B-1 100 MG PO TABS
100.0000 mg | ORAL_TABLET | Freq: Every day | ORAL | Status: DC
  Administered 2012-10-05 – 2012-10-07 (×3): 100 mg via ORAL
  Filled 2012-10-05 (×3): qty 1

## 2012-10-05 MED ORDER — ACETAMINOPHEN 325 MG PO TABS: 650.0000 mg | ORAL_TABLET | Freq: Four times a day (QID) | ORAL | Status: DC | PRN

## 2012-10-05 MED ORDER — LORAZEPAM 2 MG/ML IJ SOLN: 1.0000 mg | Freq: Four times a day (QID) | INTRAMUSCULAR | Status: DC | PRN

## 2012-10-05 MED ORDER — ALBUTEROL SULFATE (5 MG/ML) 0.5% IN NEBU
2.5000 mg | INHALATION_SOLUTION | RESPIRATORY_TRACT | Status: DC | PRN
  Administered 2012-10-07 (×2): 2.5 mg via RESPIRATORY_TRACT
  Filled 2012-10-05: qty 0.5

## 2012-10-05 MED ORDER — TIOTROPIUM BROMIDE MONOHYDRATE 18 MCG IN CAPS
18.0000 ug | ORAL_CAPSULE | Freq: Every day | RESPIRATORY_TRACT | Status: DC
  Administered 2012-10-05 – 2012-10-07 (×3): 18 ug via RESPIRATORY_TRACT
  Filled 2012-10-05: qty 5

## 2012-10-05 MED ORDER — ONDANSETRON HCL 4 MG/2ML IJ SOLN: 4.0000 mg | Freq: Four times a day (QID) | INTRAMUSCULAR | Status: DC | PRN

## 2012-10-05 MED ORDER — THIAMINE HCL 100 MG/ML IJ SOLN
100.0000 mg | Freq: Every day | INTRAMUSCULAR | Status: DC
  Filled 2012-10-05 (×3): qty 1

## 2012-10-05 MED ORDER — AMLODIPINE BESYLATE 10 MG PO TABS
10.0000 mg | ORAL_TABLET | Freq: Every day | ORAL | Status: DC
  Administered 2012-10-05 – 2012-10-07 (×3): 10 mg via ORAL
  Filled 2012-10-05 (×3): qty 1

## 2012-10-05 MED ORDER — LORAZEPAM 1 MG PO TABS
1.0000 mg | ORAL_TABLET | Freq: Four times a day (QID) | ORAL | Status: DC | PRN
  Administered 2012-10-05: 1 mg via ORAL
  Filled 2012-10-05: qty 1

## 2012-10-05 MED ORDER — HYDROCODONE-ACETAMINOPHEN 10-325 MG PO TABS
1.0000 | ORAL_TABLET | Freq: Four times a day (QID) | ORAL | Status: DC | PRN
  Administered 2012-10-05 – 2012-10-06 (×6): 2 via ORAL
  Filled 2012-10-05 (×7): qty 2

## 2012-10-05 MED ORDER — ACETAMINOPHEN 650 MG RE SUPP: 650.0000 mg | Freq: Four times a day (QID) | RECTAL | Status: DC | PRN

## 2012-10-05 MED ORDER — FOLIC ACID 1 MG PO TABS
1.0000 mg | ORAL_TABLET | Freq: Every day | ORAL | Status: DC
  Administered 2012-10-05 – 2012-10-07 (×3): 1 mg via ORAL
  Filled 2012-10-05 (×3): qty 1

## 2012-10-05 MED ORDER — AMLODIPINE-OLMESARTAN 10-40 MG PO TABS: 1.0000 | ORAL_TABLET | Freq: Every day | ORAL | Status: DC

## 2012-10-05 MED ORDER — PANTOPRAZOLE SODIUM 40 MG PO TBEC
40.0000 mg | DELAYED_RELEASE_TABLET | Freq: Every day | ORAL | Status: DC
  Administered 2012-10-05 – 2012-10-07 (×3): 40 mg via ORAL
  Filled 2012-10-05 (×3): qty 1

## 2012-10-05 MED ORDER — ALBUTEROL SULFATE (5 MG/ML) 0.5% IN NEBU
2.5000 mg | INHALATION_SOLUTION | Freq: Four times a day (QID) | RESPIRATORY_TRACT | Status: DC
  Administered 2012-10-05: 11:00:00 2.5 mg via RESPIRATORY_TRACT
  Filled 2012-10-05: qty 0.5

## 2012-10-05 MED ORDER — METOPROLOL SUCCINATE ER 25 MG PO TB24
25.0000 mg | ORAL_TABLET | Freq: Every day | ORAL | Status: DC
  Administered 2012-10-05 – 2012-10-07 (×3): 25 mg via ORAL
  Filled 2012-10-05 (×3): qty 1

## 2012-10-05 MED ORDER — SODIUM CHLORIDE 0.9 % IJ SOLN
3.0000 mL | Freq: Two times a day (BID) | INTRAMUSCULAR | Status: DC
  Administered 2012-10-06: 3 mL via INTRAVENOUS

## 2012-10-05 MED ORDER — ONDANSETRON HCL 4 MG PO TABS: 4.0000 mg | ORAL_TABLET | Freq: Four times a day (QID) | ORAL | Status: DC | PRN

## 2012-10-05 MED ORDER — GI COCKTAIL ~~LOC~~
30.0000 mL | Freq: Once | ORAL | Status: AC
  Administered 2012-10-05: 30 mL via ORAL
  Filled 2012-10-05: qty 30

## 2012-10-05 MED ORDER — NALOXONE HCL 0.4 MG/ML IJ SOLN
0.4000 mg | Freq: Once | INTRAMUSCULAR | Status: AC
  Administered 2012-10-05: 0.4 mg via INTRAVENOUS

## 2012-10-05 MED ORDER — GUAIFENESIN ER 600 MG PO TB12
1200.0000 mg | ORAL_TABLET | Freq: Two times a day (BID) | ORAL | Status: DC | PRN
  Administered 2012-10-05: 1200 mg via ORAL
  Filled 2012-10-05: qty 2

## 2012-10-05 MED ORDER — TECHNETIUM TO 99M ALBUMIN AGGREGATED
5.7000 | Freq: Once | INTRAVENOUS | Status: AC | PRN
  Administered 2012-10-05: 16:00:00 5.7 via INTRAVENOUS

## 2012-10-05 MED ORDER — HEPARIN SODIUM (PORCINE) 5000 UNIT/ML IJ SOLN
5000.0000 [IU] | Freq: Three times a day (TID) | INTRAMUSCULAR | Status: DC
  Administered 2012-10-05 – 2012-10-07 (×7): 5000 [IU] via SUBCUTANEOUS
  Filled 2012-10-05 (×11): qty 1

## 2012-10-05 MED ORDER — SODIUM CHLORIDE 0.9 % IJ SOLN
3.0000 mL | Freq: Two times a day (BID) | INTRAMUSCULAR | Status: DC
  Administered 2012-10-06 – 2012-10-07 (×2): 3 mL via INTRAVENOUS

## 2012-10-05 MED ORDER — LORAZEPAM 2 MG/ML IJ SOLN
0.0000 mg | Freq: Four times a day (QID) | INTRAMUSCULAR | Status: AC
  Administered 2012-10-05 (×2): 1 mg via INTRAVENOUS
  Administered 2012-10-06 – 2012-10-07 (×3): 2 mg via INTRAVENOUS
  Filled 2012-10-05 (×5): qty 1

## 2012-10-05 MED ORDER — TECHNETIUM TC 99M DIETHYLENETRIAME-PENTAACETIC ACID: 45.0000 | Freq: Once | INTRAVENOUS | Status: AC | PRN

## 2012-10-05 NOTE — Care Management Note (Addendum)
    Page 1 of 2   10/07/2012     4:32:42 PM   CARE MANAGEMENT NOTE 10/07/2012  Patient:  Jesse Hartman, Jesse Hartman   Account Number:  [id number]  Date Initiated:  10/05/2012  Documentation initiated by:  [facility]  Subjective/Objective Assessment:   52 Y/O M ADMITTED W/ACUTE ENCEPHALOPATHY.+ETOH.READMIT 9/19-9/21/14-ACUTE PANCREATITIS.     Action/Plan:   FROM HOME.HAS PCP-DR.MICHAEL BADGER.PHARMACY-[facility].   Anticipated DC Date:  10/07/2012   Anticipated DC Plan:  HOME/SELF CARE  In-house referral  Financial Counselor      DC Planning Services  CM consult      Choice offered to / List presented to:     DME arranged  BIPAP      DME agency  [facility]        Status of service:  Completed, signed off Medicare Important Message given?   (If response is "NO", the following Medicare IM given date fields will be blank) Date Medicare IM given:   Date Additional Medicare IM given:    Discharge Disposition:  HOME W HOME HEALTH SERVICES  Per UR Regulation:  Reviewed for med. necessity/level of care/duration of stay  If discussed at Long Length of Stay Meetings, dates discussed:    Comments:  10-07-12 Lorenda Ishihara RN CM 1630 Patient to d/c home with bipap, arranged with [facility], equipment to be delivered to patient this pm, patient will take mask and tubing with him.  10/05/12 KATHY MAHABIR RN,BSN NCM [phone] RECEIVED REFERRAL FOR COPD PROTOCAL.ASKED PATIENT IF HE WOULD LIKE A NURSE COMING TO THE HOUSE TO F/U.HE FEELS HE CAN MANAGE W/HIS PCP-DR. BADGER'S OFFICE VISITIS.HE SAYS HE KNOWS WHAT HE NEEDS TO DO TO GET WELL.  PATIENT HAS ALREADY STARTED THE PROCESS FOR  GETTING MEDICAID W/DSS.HE HAS ALSO STARTED THE PROCESS W/HIS PCP, & DME COMPANY FOR A CPAP(OUTPATIENT SERVICE).PROVIDED [facility] AS RESOURCE.EXPLAINED THE MATCH PROGRAM, & POLICY:1 TIME USE WITHIN 12 CALENDER MONTHS/$3 CO PAY/NO NARCOTICS/MUST USE IN 7DAYS OF D/C.HE WILL TRY TO GET ALL OF HIS MEDS THROUGH HIS  PHARMACY,& WILL ONLY USE THIS PROGRAM IF IT IS AN EXTROADINARY COST.WILL CONTINUE TO MONITOR FOR PROGRESS, & D/C NEEDS.CURRENTLY NO FURTHER D/C NEEDS.

## 2012-10-05 NOTE — Progress Notes (Signed)
[facility] PROGRESS NOTE  AHMON TOSI RUE:[id number] DOB: 1960-10-07 DOA: 10/05/2012 PCP: Eartha Inch, MD  Assessment/Plan:  1. Acute encephalopathy - probably secondary to combination of alcohol and Klonopin. Patient is mildly hypoxic but not in distress and is maintaining saturation with oxygen. At this time we will continue to observe. Check ammonia levels. If mental status continues to improve then no further workup otherwise may consider MRI brain. ABG does not show any carbon dioxide retention. 2. Chest pain - suspect this is most likely secondary to esophagitis from alcoholism. But at this time we will cycle cardiac markers to rule out ACS . D dimer elevated, and his creatinine was 1.5, did v/q scan and negative for PE.  Use PPI. 3. Alcohol abuse - patient has been placed on Ativan withdrawal protocol. 4. COPD/asthma - presently not wheezing. Patient mildly hypoxic but able to maintain saturation with oxygen. Patient does have history of OSA and noncompliant with CPAP. 5. Hypertension - continue present medications. Code Status: Full code.  Family Communication: none at  the bedside.  Disposition Plan: Admit to inpatient.   Consultants:  none  Procedures:  V/////////q scan  Antibiotics: HPI/Subjective: No new complaints.   Objective: Filed Vitals:   10/05/12 0821  BP: 160/94  Pulse: 78  Temp: 97.1 F (36.2 C)  Resp: 16    Intake/Output Summary (Last 24 hours) at 10/05/12 1012 Last data filed at 10/05/12 0900  Gross per 24 hour  Intake    120 ml  Output    700 ml  Net   -580 ml   Filed Weights   10/05/12 0821  Weight: 115.214 kg (254 lb)    Exam:   General:  alerta febrile comfortable  Cardiovascular: s1s2  Respiratory: MILD scattered wheezing heard.  Abdomen: soft NT ND BS+  Musculoskeletal: no pedal edema  Data Reviewed: Basic Metabolic Panel:  Recent Labs Lab 10/05/12 0159 10/05/12 0216  NA 132* 133*  K 3.9 3.9  CL 92* 96   CO2 27  --   GLUCOSE 107* 103*  BUN 17 16  CREATININE 1.11 1.50*  CALCIUM 11.3*  --    Liver Function Tests:  Recent Labs Lab 10/05/12 0159  AST 18  ALT 22  ALKPHOS 38*  BILITOT 0.4  PROT 6.4  ALBUMIN 3.3*    Recent Labs Lab 10/05/12 0159  LIPASE 38    Recent Labs Lab 10/05/12 0825  AMMONIA 30   CBC:  Recent Labs Lab 10/05/12 0159 10/05/12 0216  WBC 10.1  --   HGB 13.7 14.3  HCT 40.1 42.0  MCV 96.4  --   PLT 188  --    Cardiac Enzymes:  Recent Labs Lab 10/05/12 0159 10/05/12 0825  TROPONINI <0.30 <0.30   BNP (last 3 results)  Recent Labs  09/24/12 0530 10/05/12 0159  PROBNP 216.7* 74.9   CBG: No results found for this basename: GLUCAP,  in the last 168 hours  No results found for this or any previous visit (from the past 240 hour(s)).   Studies: Ct Head Wo Contrast  10/05/2012   CLINICAL DATA:  Altered mental status.  EXAM: CT HEAD WITHOUT CONTRAST  TECHNIQUE: Contiguous axial images were obtained from the base of the skull through the vertex without intravenous contrast.  COMPARISON:  08/01/2011  FINDINGS: No acute intracranial abnormality. Specifically, no hemorrhage, hydrocephalus, mass lesion, acute infarction, or significant intracranial injury. No acute calvarial abnormality.  Mucosal thickening in the left maxillary sinus. No air-fluid levels. Mastoids are  clear.  IMPRESSION: No intracranial abnormality.   Electronically Signed   By: Charlett Nose M.D.   On: 10/05/2012 05:57   Dg Chest Portable 1 View  10/05/2012   CLINICAL DATA:  Altered mental status, shortness of Breath.  EXAM: PORTABLE CHEST - 1 VIEW  COMPARISON:  09/23/2012  FINDINGS: Low lung volumes. Cardiomegaly with vascular congestion and suspected mild interstitial edema. Bibasilar atelectasis. Possible small effusions. No definite acute bony abnormality.  IMPRESSION: Low lung volumes. Cardiomegaly with vascular congestion and possible early interstitial edema.   Electronically  Signed   By: Charlett Nose M.D.   On: 10/05/2012 02:15    Scheduled Meds: . albuterol  2.5 mg Nebulization Q6H  . amLODipine  10 mg Oral Daily  . folic acid  1 mg Oral Daily  . ipratropium  0.5 mg Nebulization Q6H  . LORazepam  0-4 mg Intravenous Q6H   Followed by  . [START ON 10/07/2012] LORazepam  0-4 mg Intravenous Q12H  . metoprolol succinate  25 mg Oral Daily  . multivitamin with minerals  1 tablet Oral Daily  . nicotine  21 mg Transdermal Daily  . pantoprazole  40 mg Oral Daily  . sodium chloride  3 mL Intravenous Q12H  . sodium chloride  3 mL Intravenous Q12H  . thiamine  100 mg Oral Daily   Or  . thiamine  100 mg Intravenous Daily  . tiotropium  18 mcg Inhalation Daily   Continuous Infusions:   Principal Problem:   Acute encephalopathy Active Problems:   GERD   HTN (hypertension)   Chest pain   COPD (chronic obstructive pulmonary disease)        Jesse Hartman  [facility] Pager [phone] If 7PM-7AM, please contact night-coverage at [email or web address], password [facility] 10/05/2012, 10:12 AM  LOS: 0 days

## 2012-10-05 NOTE — ED Notes (Signed)
At this time, patient is maintaining an oxygen saturation of 90-91% on room air.

## 2012-10-05 NOTE — ED Notes (Signed)
Room air O2 sat at 88%; patient sleeping. Patient aroused and O2 sat increased to 95%.

## 2012-10-05 NOTE — ED Provider Notes (Signed)
CSN: [id number]     Arrival date & time 10/05/12  0049 History   First MD Initiated Contact with Patient 10/05/12 0124     Chief Complaint  Patient presents with  . Altered Mental Status   (Consider location/radiation/quality/duration/timing/severity/associated sxs/prior Treatment) HPI History provided by patient's daughter.  Recently discharged from the hospital, has not been able to afford medications and does not have insurance - he has not been able to get a CPAP machine.  He has history of esophagitis, COPD, and per his daughter is an alcoholic. Tonight he has been drinking alcohol and taking Klonopin. He was complaining of his typical esophageal pains earlier tonight that improved with TUMS. Later he became lethargic and her daughter are having difficulty breathing, and was brought here for evaluation. Patient with slurred speech provides limited history, denies any chest pain or difficulty breathing at this time. Family concerned that he may have overdosed on Klonopin and Norco with suspected alcohol use tonight. Patient denies overdose or suicidal ideations. No headache or unilateral weakness.  Past Medical History  Diagnosis Date  . Hypertension   . Barrett's syndrome   . Arthritis   . Hypokalemia   . Hyponatremia   . Esophagitis   . GERD (gastroesophageal reflux disease)   . DVT (deep venous thrombosis)   . COPD (chronic obstructive pulmonary disease)    Past Surgical History  Procedure Laterality Date  . Back surgery    . Rhinoplasty    . Bunionectomy    . Appendectomy    . Esophagogastroduodenoscopy  08/03/2011    Procedure: ESOPHAGOGASTRODUODENOSCOPY (EGD);  Surgeon: Rachael Fee, MD;  Location: [facility];  Service: Endoscopy;  Laterality: N/A;  . Finger surgery      lt middle   Family History  Problem Relation Age of Onset  . Diabetes Maternal Grandfather    History  Substance Use Topics  . Smoking status: Current Every Day Smoker -- 1.00 packs/day for 30  years    Types: Cigarettes  . Smokeless tobacco: Not on file  . Alcohol Use: Yes    Review of Systems  Constitutional: Negative for fever and chills.  HENT: Negative for neck pain.   Respiratory: Negative for choking.   Cardiovascular: Negative for leg swelling.  Gastrointestinal: Negative for vomiting and abdominal pain.  Musculoskeletal: Negative for back pain.  Skin: Negative for rash.  Neurological: Negative for headaches.  All other systems reviewed and are negative.    Allergies  Review of patient's allergies indicates no known allergies.  Home Medications   Current Outpatient Rx  Name  Route  Sig  Dispense  Refill  . albuterol (PROVENTIL HFA;VENTOLIN HFA) 108 (90 BASE) MCG/ACT inhaler   Inhalation   Inhale 2 puffs into the lungs every 6 (six) hours as needed for wheezing.         Marland Kitchen amLODipine-olmesartan (AZOR) 10-40 MG per tablet   Oral   Take 1 tablet by mouth daily. Lot [id number]  Exp 04/06/2015         . clonazePAM (KLONOPIN) 1 MG tablet   Oral   Take 2 mg by mouth 2 (two) times daily as needed for anxiety.         Marland Kitchen glucosamine-chondroitin 500-400 MG tablet   Oral   Take 1 tablet by mouth daily.         Marland Kitchen guaiFENesin (MUCINEX) 600 MG 12 hr tablet   Oral   Take 1,200 mg by mouth 2 (two) times daily as needed for  congestion.         Marland Kitchen HYDROcodone-acetaminophen (NORCO) 10-325 MG per tablet   Oral   Take 1-2 tablets by mouth every 6 (six) hours as needed for pain.         . Methylsulfonylmethane (MSM PO)   Oral   Take 1,500 mg by mouth daily.         . metoprolol succinate (TOPROL-XL) 25 MG 24 hr tablet   Oral   Take 25 mg by mouth daily.         . naproxen sodium (ANAPROX) 220 MG tablet   Oral   Take 440 mg by mouth 2 (two) times daily with a meal.         . olmesartan (BENICAR) 20 MG tablet   Oral   Take 20 mg by mouth daily.         Marland Kitchen omeprazole (PRILOSEC) 40 MG capsule   Oral   Take 40 mg by mouth daily.         Marland Kitchen  tiotropium (SPIRIVA) 18 MCG inhalation capsule   Inhalation   Place 18 mcg into inhaler and inhale daily.          BP 102/69  Temp(Src) 98 F (36.7 C) (Oral)  Resp 12  SpO2 97% Physical Exam  Constitutional: He appears well-developed and well-nourished.  HENT:  Head: Normocephalic and atraumatic.  Mouth/Throat: Oropharynx is clear and moist.  Eyes: EOM are normal. Pupils are equal, round, and reactive to light.  Neck: Neck supple.  Cardiovascular: Normal rate, regular rhythm and intact distal pulses.   Pulmonary/Chest: Effort normal and breath sounds normal. No respiratory distress. He exhibits no tenderness.  Abdominal: Soft. He exhibits no distension. There is no tenderness.  Musculoskeletal: Normal range of motion. He exhibits no edema.  Neurological:  Awake, opens eyes to verbal, slurred speech, follows limited commands, no unilateral deficits otherwise  Skin: Skin is warm and dry.    ED Course  Procedures (including critical care time) Labs Review Labs Reviewed  CBC - Abnormal; Notable for the following:    RBC 4.16 (*)    All other components within normal limits  COMPREHENSIVE METABOLIC PANEL - Abnormal; Notable for the following:    Sodium 132 (*)    Chloride 92 (*)    Glucose, Bld 107 (*)    Calcium 11.3 (*)    Albumin 3.3 (*)    Alkaline Phosphatase 38 (*)    GFR calc non Af Amer 75 (*)    GFR calc Af Amer 87 (*)    All other components within normal limits  ETHANOL - Abnormal; Notable for the following:    Alcohol, Ethyl (B) 83 (*)    All other components within normal limits  URINE RAPID DRUG SCREEN (HOSP PERFORMED) - Abnormal; Notable for the following:    Opiates POSITIVE (*)    All other components within normal limits  BLOOD GAS, ARTERIAL - Abnormal; Notable for the following:    pO2, Arterial 58.4 (*)    Bicarbonate 27.8 (*)    Acid-Base Excess 3.9 (*)    All other components within normal limits  D-DIMER, QUANTITATIVE - Abnormal; Notable for  the following:    D-Dimer, Quant 0.60 (*)    All other components within normal limits  CREATININE, SERUM - Abnormal; Notable for the following:    GFR calc non Af Amer 79 (*)    All other components within normal limits  BASIC METABOLIC PANEL - Abnormal; Notable for the  following:    Sodium 133 (*)    Glucose, Bld 111 (*)    GFR calc non Af Amer 85 (*)    All other components within normal limits  CBC - Abnormal; Notable for the following:    RBC 3.95 (*)    HCT 38.3 (*)    All other components within normal limits  POCT I-STAT, CHEM 8 - Abnormal; Notable for the following:    Sodium 133 (*)    Creatinine, Ser 1.50 (*)    Glucose, Bld 103 (*)    Calcium, Ion 1.55 (*)    All other components within normal limits  TROPONIN I  LIPASE, BLOOD  PRO B NATRIURETIC PEPTIDE  TROPONIN I  TROPONIN I  TROPONIN I  AMMONIA  TSH   Imaging Review Ct Head Wo Contrast  10/05/2012   CLINICAL DATA:  Altered mental status.  EXAM: CT HEAD WITHOUT CONTRAST  TECHNIQUE: Contiguous axial images were obtained from the base of the skull through the vertex without intravenous contrast.  COMPARISON:  08/01/2011  FINDINGS: No acute intracranial abnormality. Specifically, no hemorrhage, hydrocephalus, mass lesion, acute infarction, or significant intracranial injury. No acute calvarial abnormality.  Mucosal thickening in the left maxillary sinus. No air-fluid levels. Mastoids are clear.  IMPRESSION: No intracranial abnormality.   Electronically Signed   By: Charlett Nose M.D.   On: 10/05/2012 05:57   Nm Pulmonary Perf And Vent  10/05/2012   *RADIOLOGY REPORT*  Clinical Data:  Shortness of breath, chest pain, elevated D-dimer, history of COPD, evaluate for pulmonary embolism, initial encounter.  NUCLEAR MEDICINE VENTILATION - PERFUSION LUNG SCAN  Technique:  Ventilation images were obtained using aerosolized technetium 58m DTPA.  Perfusion images were obtained in multiple projections after intravenous injection of  Tc-17m MAA.  Radiopharmaceuticals:  Aerosolized 45 mCi technetium 35m DTPA and 5.7 mCi Tc-66m MAA.  Comparison:  Chest radiograph - 10/05/2012; 09/23/2012  Findings:  Review of chest radiograph performed earlier same day demonstrates borderline enlarged cardiac silhouette.  Decreased lung volumes with bibasilar opacities favored to represent atelectasis.  No definite pleural effusion or pneumothorax.  No definite evidence of edema.  Ventilatory images: There is mild mottled aeration of the bilateral lungs, left greater than right.  There is clumping of inhaled radiotracer about the bilateral pulmonary hila.  There is a minimal amount of ingested radiotracer within the hypopharynx and superior/mid aspect of the esophagus.  Perfusion images:  There is relative homogeneous distribution of administered radiotracer without discrete mismatched segmental or subsegmental defect to suggest pulmonary embolism.  IMPRESSION: Pulmonary embolism absent (very low probability for pulmonary embolism).   Original Report Authenticated By: Tacey Ruiz, MD     Date: 10/05/2012  Rate: 79  Rhythm: normal sinus rhythm  QRS Axis: normal  Intervals: normal  ST/T Wave abnormalities: nonspecific ST changes  Conduction Disutrbances:none  Narrative Interpretation:   Old EKG Reviewed: unchanged  mental status unchanged, d/w hospitalist DR Toniann Fail, will admit  MDM  Dx: AMS, possible overdose  ECG, labs, imaging MED admit    Sunnie Nielsen, MD 10/07/12 [phone]

## 2012-10-05 NOTE — ED Notes (Signed)
Pt was assisted out of the car in triage, unresponsive, he's currently talking to [place] but is not making much sense, he states that he was at a neighbors house and they said "he slumped" over, pt said he refused to ride with ems

## 2012-10-05 NOTE — ED Notes (Signed)
Daughter states that he was drinking tonight and took his klonopin tonight, she states that he was sleeping and then jumped up and wanted tums and his spiriva, he's suppose to have a CPAP machine but doesn't have it yet. Daughter states that he vomited earlier and turned blue, he said he was having chest pains and wanted more tums. They do not have insurance so it's taking them along time to get the CPAP machine.

## 2012-10-05 NOTE — ED Notes (Signed)
Patient more alert after receiving Narcan. Patient has taken his oxygen off; informed patient that he needed to keep oxygen on because his O2 saturation dropped when his oxygen was removed. Patient states "I can breathe" while continuing to remove O2 tubing from nose. Again, reiterated to patient that he needed to keep oxygen in place.

## 2012-10-05 NOTE — Progress Notes (Signed)
Patient's d-dimer postive at 0.60. MD paged with findings.  Will continue to monitor and await MD call.

## 2012-10-05 NOTE — ED Notes (Signed)
Kakrakandy, MD at bedside 

## 2012-10-05 NOTE — ED Notes (Signed)
Room air O2 sat 88%.

## 2012-10-05 NOTE — H&P (Addendum)
[facility] History and Physical  Jesse Hartman AVW:[id number] DOB: 26-Aug-1960 DOA: 10/05/2012  Referring physician: ER physician. PCP: Eartha Inch, MD   Chief Complaint: Confusion.  HPI: Jesse Hartman is a 52 y.o. male with known history of COPD, alcoholism, hypertension was recently admitted for COPD exacerbation was brought to the ER after patient's daughter found the patient was increasingly confused. Patient woke up in the midnight with chest discomfort and later was having increasing confusion. Patient was brought to the ER. CT head was negative for anything acute. Patient previously got better but still is somnolent. Patient's daughter feels that he may be taking more than required Klonopin. Patient also has positive for alcohol. Patient states that he has been having some chest discomfort like a burning sensation with also some throat discomfort. Patient otherwise denies any nausea vomiting abdominal pain or diarrhea fever chills. Patient will be admitted for further management.  Review of Systems: As presented in the history of presenting illness, rest negative.  Past Medical History  Diagnosis Date  . Hypertension   . Barrett's syndrome   . Arthritis   . Hypokalemia   . Hyponatremia   . Esophagitis   . GERD (gastroesophageal reflux disease)   . DVT (deep venous thrombosis)   . COPD (chronic obstructive pulmonary disease)    Past Surgical History  Procedure Laterality Date  . Back surgery    . Rhinoplasty    . Bunionectomy    . Appendectomy    . Esophagogastroduodenoscopy  08/03/2011    Procedure: ESOPHAGOGASTRODUODENOSCOPY (EGD);  Surgeon: Rachael Fee, MD;  Location: [facility];  Service: Endoscopy;  Laterality: N/A;  . Finger surgery      lt middle   Social History:  reports that he has been smoking Cigarettes.  He has a 30 pack-year smoking history. He does not have any smokeless tobacco history on file. He reports that  drinks alcohol. He reports that he  does not use illicit drugs. Home. where does patient live-- Can do ADLs. Can patient participate in ADLs?  No Known Allergies  Family History  Problem Relation Age of Onset  . Diabetes Maternal Grandfather       Prior to Admission medications   Medication Sig Start Date End Date Taking? Authorizing Provider  albuterol (PROVENTIL HFA;VENTOLIN HFA) 108 (90 BASE) MCG/ACT inhaler Inhale 2 puffs into the lungs every 6 (six) hours as needed for wheezing.   Yes Historical Provider, MD  amLODipine-olmesartan (AZOR) 10-40 MG per tablet Take 1 tablet by mouth daily. Lot [id number]  Exp 04/06/2015 09/29/12  Yes Historical Provider, MD  clonazePAM (KLONOPIN) 1 MG tablet Take 2 mg by mouth 2 (two) times daily as needed for anxiety.   Yes Historical Provider, MD  glucosamine-chondroitin 500-400 MG tablet Take 1 tablet by mouth daily.   Yes Historical Provider, MD  guaiFENesin (MUCINEX) 600 MG 12 hr tablet Take 1,200 mg by mouth 2 (two) times daily as needed for congestion.   Yes Historical Provider, MD  HYDROcodone-acetaminophen (NORCO) 10-325 MG per tablet Take 1-2 tablets by mouth every 6 (six) hours as needed for pain.   Yes Historical Provider, MD  Methylsulfonylmethane (MSM PO) Take 1,500 mg by mouth daily.   Yes Historical Provider, MD  metoprolol succinate (TOPROL-XL) 25 MG 24 hr tablet Take 25 mg by mouth daily.   Yes Historical Provider, MD  naproxen sodium (ANAPROX) 220 MG tablet Take 440 mg by mouth 2 (two) times daily with a meal.   Yes Historical  Provider, MD  olmesartan (BENICAR) 20 MG tablet Take 20 mg by mouth daily.   Yes Historical Provider, MD  omeprazole (PRILOSEC) 40 MG capsule Take 40 mg by mouth daily.   Yes Historical Provider, MD  tiotropium (SPIRIVA) 18 MCG inhalation capsule Place 18 mcg into inhaler and inhale daily.   Yes Historical Provider, MD   Physical Exam: Filed Vitals:   10/05/12 0339 10/05/12 0400 10/05/12 0416 10/05/12 0500  BP:  118/55  106/71  Pulse:  89  81   Temp:      TempSrc:      Resp:  16  16  SpO2: 96% 89% 88% 91%     General:  Well-developed and nourished.  Eyes: Anicteric no pallor.  ENT: No discharge from the ears eyes nose mouth.  Neck: No mass felt.  Cardiovascular: S1-S2 heard.  Respiratory: No rhonchi or crepitations.  Abdomen: Soft nontender bowel sounds present.  Skin: No rash.  Musculoskeletal: No edema.  Psychiatric: Appears normal.  Neurologic: Patient is drowsy but arousable and is oriented to time place and person. Moves all extremities.  Labs on Admission:  Basic Metabolic Panel:  Recent Labs Lab 10/05/12 0159 10/05/12 0216  NA 132* 133*  K 3.9 3.9  CL 92* 96  CO2 27  --   GLUCOSE 107* 103*  BUN 17 16  CREATININE 1.11 1.50*  CALCIUM 11.3*  --    Liver Function Tests:  Recent Labs Lab 10/05/12 0159  AST 18  ALT 22  ALKPHOS 38*  BILITOT 0.4  PROT 6.4  ALBUMIN 3.3*    Recent Labs Lab 10/05/12 0159  LIPASE 38   No results found for this basename: AMMONIA,  in the last 168 hours CBC:  Recent Labs Lab 10/05/12 0159 10/05/12 0216  WBC 10.1  --   HGB 13.7 14.3  HCT 40.1 42.0  MCV 96.4  --   PLT 188  --    Cardiac Enzymes:  Recent Labs Lab 10/05/12 0159  TROPONINI <0.30    BNP (last 3 results)  Recent Labs  09/24/12 0530 10/05/12 0159  PROBNP 216.7* 74.9   CBG: No results found for this basename: GLUCAP,  in the last 168 hours  Radiological Exams on Admission: Ct Head Wo Contrast  10/05/2012   CLINICAL DATA:  Altered mental status.  EXAM: CT HEAD WITHOUT CONTRAST  TECHNIQUE: Contiguous axial images were obtained from the base of the skull through the vertex without intravenous contrast.  COMPARISON:  08/01/2011  FINDINGS: No acute intracranial abnormality. Specifically, no hemorrhage, hydrocephalus, mass lesion, acute infarction, or significant intracranial injury. No acute calvarial abnormality.  Mucosal thickening in the left maxillary sinus. No air-fluid  levels. Mastoids are clear.  IMPRESSION: No intracranial abnormality.   Electronically Signed   By: Charlett Nose M.D.   On: 10/05/2012 05:57   Dg Chest Portable 1 View  10/05/2012   CLINICAL DATA:  Altered mental status, shortness of Breath.  EXAM: PORTABLE CHEST - 1 VIEW  COMPARISON:  09/23/2012  FINDINGS: Low lung volumes. Cardiomegaly with vascular congestion and suspected mild interstitial edema. Bibasilar atelectasis. Possible small effusions. No definite acute bony abnormality.  IMPRESSION: Low lung volumes. Cardiomegaly with vascular congestion and possible early interstitial edema.   Electronically Signed   By: Charlett Nose M.D.   On: 10/05/2012 02:15    EKG: Independently reviewed. Normal sinus rhythm.  Assessment/Plan Principal Problem:   Acute encephalopathy Active Problems:   GERD   HTN (hypertension)   Chest pain  COPD (chronic obstructive pulmonary disease)   1. Acute encephalopathy - probably secondary to combination of alcohol and Klonopin. Patient is mildly hypoxic but not in distress and is maintaining saturation with oxygen. At this time we will continue to observe. Check ammonia levels. If mental status continues to improve then no further workup otherwise may consider MRI brain. ABG does not show any carbon dioxide retention. 2. Chest pain - suspect this is most likely secondary to esophagitis from alcoholism. But at this time we will cycle cardiac markers to rule out ACS and check D-DIMER. Use PPI. 3. Alcohol abuse - patient has been placed on Ativan withdrawal protocol. 4. COPD/asthma - presently not wheezing. Patient mildly hypoxic but able to maintain saturation with oxygen. Patient does have history of OSA and noncompliant with CPAP. 5. Hypertension - continue present medications.    Code Status: Full code.  Family Communication: Patient's family the bedside.  Disposition Plan: Admit to inpatient.    KAKRAKANDY,ARSHAD N. [facility] Pager  [phone].  If 7PM-7AM, please contact night-coverage [email or web address] Password [email or web address] 10/05/2012, 7:01 AM

## 2012-10-05 NOTE — ED Notes (Signed)
Patient Jesse Hartman sat 86-88% on room air. Patient placed on O2 2 liters/min via [place].

## 2012-10-05 NOTE — ED Notes (Signed)
O2 sat 96% at this time on 2 liters/min via [place].  Oxygen therapy interrupted to see if patient can maintain oxygen saturation on room air.

## 2012-10-05 NOTE — ED Notes (Signed)
Patient's daughter took his belongings home.

## 2012-10-06 DIAGNOSIS — K219 Gastro-esophageal reflux disease without esophagitis: Secondary | ICD-10-CM

## 2012-10-06 DIAGNOSIS — J441 Chronic obstructive pulmonary disease with (acute) exacerbation: Secondary | ICD-10-CM

## 2012-10-06 DIAGNOSIS — G9340 Encephalopathy, unspecified: Principal | ICD-10-CM

## 2012-10-06 DIAGNOSIS — J9600 Acute respiratory failure, unspecified whether with hypoxia or hypercapnia: Secondary | ICD-10-CM

## 2012-10-06 DIAGNOSIS — R0602 Shortness of breath: Secondary | ICD-10-CM

## 2012-10-06 LAB — CBC
HCT: 38.3 % — ABNORMAL LOW (ref 39.0–52.0)
Hemoglobin: 13.3 g/dL (ref 13.0–17.0)
MCHC: 34.7 g/dL (ref 30.0–36.0)
MCV: 97 fL (ref 78.0–100.0)
RDW: 12.6 % (ref 11.5–15.5)
WBC: 10.1 10*3/uL (ref 4.0–10.5)

## 2012-10-06 LAB — BASIC METABOLIC PANEL
BUN: 17 mg/dL (ref 6–23)
CO2: 30 mEq/L (ref 19–32)
Chloride: 96 mEq/L (ref 96–112)
Creatinine, Ser: 1 mg/dL (ref 0.50–1.35)
GFR calc Af Amer: >90 mL/min (ref >90)
Glucose, Bld: 111 mg/dL — ABNORMAL HIGH (ref 70–99)
Potassium: 4.5 mEq/L (ref 3.5–5.1)

## 2012-10-06 MED ORDER — PREDNISONE 50 MG PO TABS
60.0000 mg | ORAL_TABLET | Freq: Every day | ORAL | Status: DC
  Administered 2012-10-07: 60 mg via ORAL
  Filled 2012-10-06 (×2): qty 1

## 2012-10-06 MED ORDER — METHYLPREDNISOLONE SODIUM SUCC 125 MG IJ SOLR
60.0000 mg | Freq: Once | INTRAMUSCULAR | Status: AC
  Administered 2012-10-06: 17:00:00 60 mg via INTRAVENOUS
  Filled 2012-10-06: qty 0.96

## 2012-10-06 NOTE — Progress Notes (Signed)
Clinical Social Work Department BRIEF PSYCHOSOCIAL ASSESSMENT 10/06/2012  Patient:  Jesse Hartman, Jesse Hartman     Account Number:  [id number]     Admit date:  10/05/2012  Clinical Social Worker:  Dennison Bulla  Date/Time:  10/06/2012 04:00 PM  Referred by:  Physician  Date Referred:  10/06/2012 Referred for  Other - See comment   Other Referral:   COPD gold protocol   Interview type:  Patient Other interview type:    PSYCHOSOCIAL DATA Living Status:  FAMILY Admitted from facility:   Level of care:   Primary support name:  Adria Primary support relationship to patient:  CHILD, ADULT Degree of support available:   Adequate    CURRENT CONCERNS Current Concerns  Other - See comment   Other Concerns:   COPD gold protocol    SOCIAL WORK ASSESSMENT / PLAN CSW received consult for COPD Gold Protocol - CSW administered Depression Scale (patient scored 10/27) & Generalized Anxiety Disorder scale (patient scored 11/21). Screening forms placed on shadow chart. Patient states that he has been taking Klonopin which he feels has been helpful with his anxiety. Patient states he would be amenable to outpatient follow up. Patient states she plans to return home with his dtr at discharge.    Patient reports he was scheduled to go to [facility] but was unable to attend before being hospitalized again. Patient reports he has been for treatment for anxiety as well as substance abuse. Patient admits to current substance use and reports he "self medicates" but declines any substance abuse treatment at this time. Patient's wife passed away about 12 years ago and patient raised his two dtrs alone. Patient reports struggles with raising children as a single father and stress of being unemployed. Patient has already applied for Medicaid and has an appointment to apply for disability on 11/7. Patient reports that his parents assist with money as needed.    Patient and dtr discussing bills and plans at DC. Patient  wants to talk with CM regarding obtaining a CPAP. CSW made CM aware and will continue to follow.   Assessment/plan status:  Psychosocial Support/Ongoing Assessment of Needs Other assessment/ plan:   Patient declined to complete SBIRT.   Information/referral to community resources:   [facility] referral    PATIENT'S/FAMILY'S RESPONSE TO PLAN OF CARE: Patient alert and oriented. Patient reports stress due to being hospitalized and money. Patient agreeable to assessment and asked CSW to follow up at a later time after he has had a chance to relax.      Unk Lightning, LCSW (Coverage for [employer])

## 2012-10-06 NOTE — Progress Notes (Signed)
Nutrition Brief Note  RD consulted on patient 2/2 COPD GOLD Protocol. Pt reports that his oral intake at home is adequate. Eats 3 meals a day. Pt is unhappy with his heart healthy diet, requesting a Regular diet - MD if agree, please order.  Pt with stable weight. He states that he has family members at home who nag him about his eating and denies any nutrition questions/concerns at this time.  Wt Readings from Last 15 Encounters:  10/05/12 254 lb (115.214 kg)  09/23/12 259 lb 0.7 oz (117.5 kg)  08/03/11 233 lb 14.5 oz (106.1 kg)  08/03/11 233 lb 14.5 oz (106.1 kg)  06/11/09 230 lb 6.1 oz (104.5 kg)    Body mass index is 32.6 kg/(m^2). Patient meets criteria for Obese Class I based on current BMI.   Current diet order is Heart Healthy, patient is consuming approximately 100% of meals at this time. Labs and medications reviewed.   No nutrition interventions warranted at this time. If nutrition issues arise, please consult RD.   Jarold Motto MS, RD, LDN Pager: [phone] After-hours pager: [phone]

## 2012-10-06 NOTE — Progress Notes (Signed)
*  Preliminary Results* Bilateral lower extremity venous duplex completed. Bilateral lower extremities are negative for deep vein thrombosis. There is no evidence of Baker's cyst bilaterally.  10/06/2012  Gertie Fey, RVT, RDCS, RDMS

## 2012-10-06 NOTE — Progress Notes (Signed)
[facility] PROGRESS NOTE  Jesse Hartman VWU:[id number] DOB: 03-11-1960 DOA: 10/05/2012 PCP: Eartha Inch, MD  Assessment/Plan:  1. Acute encephalopathy - resolved.  probably secondary to combination of alcohol and Klonopin. Patient is mildly hypoxic but not in distress and is maintaining saturation with oxygen. At this time we will continue to observe. 2. Chest pain - suspect this is most likely secondary to esophagitis from alcoholism. But at this time we will cycle cardiac markers to rule out ACS . D dimer elevated, and his creatinine was 1.5, did v/q scan and negative for PE.  Use PPI. 3. Alcohol abuse - patient has been placed on Ativan withdrawal protocol. 4. COPD/asthma - presently  Wheezing, will start her on IV solumedrol. Patient mildly hypoxic but able to maintain saturation with oxygen. Patient does have history of OSA and noncompliant with CPAP. 5. Hypertension - continue present medications. Code Status: Full code.  Family Communication: none at  the bedside.  Disposition Plan: inpatient.   Consultants:  none  Procedures:  V/////////q scan  Antibiotics: HPI/Subjective:  reports he has tremors and feels shaky. Coughing. And sob.  Objective: Filed Vitals:   10/06/12 1300  BP: 110/63  Pulse: 79  Temp: 97.9 F (36.6 C)  Resp: 16    Intake/Output Summary (Last 24 hours) at 10/06/12 1901 Last data filed at 10/06/12 2956  Gross per 24 hour  Intake      0 ml  Output   2150 ml  Net  -2150 ml   Filed Weights   10/05/12 0821  Weight: 115.214 kg (254 lb)    Exam:   General:  alerta febrile comfortable  Cardiovascular: s1s2  Respiratory: MILD scattered wheezing heard.  Abdomen: soft NT ND BS+  Musculoskeletal: no pedal edema  Data Reviewed: Basic Metabolic Panel:  Recent Labs Lab 10/05/12 0159 10/05/12 0216 10/05/12 1451 10/06/12 0453  NA 132* 133*  --  133*  K 3.9 3.9  --  4.5  CL 92* 96  --  96  CO2 27  --   --  30  GLUCOSE 107*  103*  --  111*  BUN 17 16  --  17  CREATININE 1.11 1.50* 1.07 1.00  CALCIUM 11.3*  --   --  9.3   Liver Function Tests:  Recent Labs Lab 10/05/12 0159  AST 18  ALT 22  ALKPHOS 38*  BILITOT 0.4  PROT 6.4  ALBUMIN 3.3*    Recent Labs Lab 10/05/12 0159  LIPASE 38    Recent Labs Lab 10/05/12 0825  AMMONIA 30   CBC:  Recent Labs Lab 10/05/12 0159 10/05/12 0216 10/06/12 0453  WBC 10.1  --  10.1  HGB 13.7 14.3 13.3  HCT 40.1 42.0 38.3*  MCV 96.4  --  97.0  PLT 188  --  172   Cardiac Enzymes:  Recent Labs Lab 10/05/12 0159 10/05/12 0825 10/05/12 1346 10/05/12 1958  TROPONINI <0.30 <0.30 <0.30 <0.30   BNP (last 3 results)  Recent Labs  09/24/12 0530 10/05/12 0159  PROBNP 216.7* 74.9   CBG: No results found for this basename: GLUCAP,  in the last 168 hours  No results found for this or any previous visit (from the past 240 hour(s)).   Studies: Ct Head Wo Contrast  10/05/2012   CLINICAL DATA:  Altered mental status.  EXAM: CT HEAD WITHOUT CONTRAST  TECHNIQUE: Contiguous axial images were obtained from the base of the skull through the vertex without intravenous contrast.  COMPARISON:  08/01/2011  FINDINGS: No acute intracranial abnormality. Specifically, no hemorrhage, hydrocephalus, mass lesion, acute infarction, or significant intracranial injury. No acute calvarial abnormality.  Mucosal thickening in the left maxillary sinus. No air-fluid levels. Mastoids are clear.  IMPRESSION: No intracranial abnormality.   Electronically Signed   By: Charlett Nose M.D.   On: 10/05/2012 05:57   Nm Pulmonary Perf And Vent  10/05/2012   *RADIOLOGY REPORT*  Clinical Data:  Shortness of breath, chest pain, elevated D-dimer, history of COPD, evaluate for pulmonary embolism, initial encounter.  NUCLEAR MEDICINE VENTILATION - PERFUSION LUNG SCAN  Technique:  Ventilation images were obtained using aerosolized technetium 21m DTPA.  Perfusion images were obtained in multiple  projections after intravenous injection of Tc-14m MAA.  Radiopharmaceuticals:  Aerosolized 45 mCi technetium 72m DTPA and 5.7 mCi Tc-40m MAA.  Comparison:  Chest radiograph - 10/05/2012; 09/23/2012  Findings:  Review of chest radiograph performed earlier same day demonstrates borderline enlarged cardiac silhouette.  Decreased lung volumes with bibasilar opacities favored to represent atelectasis.  No definite pleural effusion or pneumothorax.  No definite evidence of edema.  Ventilatory images: There is mild mottled aeration of the bilateral lungs, left greater than right.  There is clumping of inhaled radiotracer about the bilateral pulmonary hila.  There is a minimal amount of ingested radiotracer within the hypopharynx and superior/mid aspect of the esophagus.  Perfusion images:  There is relative homogeneous distribution of administered radiotracer without discrete mismatched segmental or subsegmental defect to suggest pulmonary embolism.  IMPRESSION: Pulmonary embolism absent (very low probability for pulmonary embolism).   Original Report Authenticated By: Tacey Ruiz, MD   Dg Chest Portable 1 View  10/05/2012   CLINICAL DATA:  Altered mental status, shortness of Breath.  EXAM: PORTABLE CHEST - 1 VIEW  COMPARISON:  09/23/2012  FINDINGS: Low lung volumes. Cardiomegaly with vascular congestion and suspected mild interstitial edema. Bibasilar atelectasis. Possible small effusions. No definite acute bony abnormality.  IMPRESSION: Low lung volumes. Cardiomegaly with vascular congestion and possible early interstitial edema.   Electronically Signed   By: Charlett Nose M.D.   On: 10/05/2012 02:15    Scheduled Meds: . albuterol  2.5 mg Nebulization Q4H  . amLODipine  10 mg Oral Daily  . folic acid  1 mg Oral Daily  . heparin subcutaneous  5,000 Units Subcutaneous Q8H  . LORazepam  0-4 mg Intravenous Q6H   Followed by  . [START ON 10/07/2012] LORazepam  0-4 mg Intravenous Q12H  . metoprolol succinate  25  mg Oral Daily  . multivitamin with minerals  1 tablet Oral Daily  . nicotine  21 mg Transdermal Daily  . pantoprazole  40 mg Oral Daily  . [START ON 10/07/2012] predniSONE  60 mg Oral Q breakfast  . sodium chloride  3 mL Intravenous Q12H  . sodium chloride  3 mL Intravenous Q12H  . thiamine  100 mg Oral Daily   Or  . thiamine  100 mg Intravenous Daily  . tiotropium  18 mcg Inhalation Daily   Continuous Infusions:   Principal Problem:   Acute encephalopathy Active Problems:   GERD   HTN (hypertension)   Chest pain   COPD (chronic obstructive pulmonary disease)        Jesse Hartman  [facility] Pager [phone] If 7PM-7AM, please contact night-coverage at [email or web address], password [facility] 10/06/2012, 7:01 PM  LOS: 1 day

## 2012-10-06 NOTE — Progress Notes (Signed)
Pt ambulated in halls on room air. O2 sats did not fall below 93%. Julio Sicks RN

## 2012-10-06 NOTE — Evaluation (Signed)
Occupational Therapy Evaluation Patient Details Name: GWIN EAGON MRN: [id number] DOB: 02-24-1960 Today's Date: 10/06/2012 Time: 9629-5284 OT Time Calculation (min): 20 min  OT Assessment / Plan / Recommendation History of present illness Pt was admitted w/ dx Acute encephalopathy. He has a h/o COPD, family reported increased confusion at admit.    Clinical Impression   Pt presents w/ deficits in areas of ADL's (see problem list below) affecting his independence at this time. He reports initial dizziness w/ sit to stand and may benefit from visual stabilization exercises if this persists. Will follow acutely for OT.   OT Assessment  Patient needs continued OT Services    Follow Up Recommendations  No OT follow up    Barriers to Discharge      Equipment Recommendations  None recommended by OT    Recommendations for Other Services    Frequency  Min 2X/week    Precautions / Restrictions Precautions Precautions: Fall Restrictions Weight Bearing Restrictions: No   Pertinent Vitals/Pain No c/o.    ADL  Eating/Feeding: Simulated;Independent Where Assessed - Eating/Feeding: Edge of bed Grooming: Performed;Wash/dry hands;Wash/dry face;Teeth care;Supervision/safety Where Assessed - Grooming: Unsupported standing Upper Body Bathing: Simulated;Supervision/safety;Set up Where Assessed - Upper Body Bathing: Unsupported sitting Lower Body Bathing: Simulated;Supervision/safety;Set up Where Assessed - Lower Body Bathing: Unsupported sit to stand Upper Body Dressing: Simulated;Set up Where Assessed - Upper Body Dressing: Unsupported sitting Lower Body Dressing: Simulated;Min guard Where Assessed - Lower Body Dressing: Unsupported sit to stand Toilet Transfer: Performed;Min guard Toilet Transfer Method: Sit to Barista: Comfort height toilet;Grab bars Toileting - Clothing Manipulation and Hygiene: Performed;Min guard Where Assessed - Medical sales representative and Hygiene: Standing;Sit to stand from 3-in-1 or toilet Tub/Shower Transfer Method: Not assessed Transfers/Ambulation Related to ADLs: Pt reports dizziness w/ sit to stand and has h/o HTN. Overall was Min guard-supervision level for functional mobility and transfers related to ADL's for safety. ADL Comments: Pt was educated in Role of OT and he then performed grooming standing at sink in bathroom w/ min guard assist. Pt reports awaiting LE dopplers this am. Will follow acutely for OT.    OT Diagnosis: Generalized weakness  OT Problem List: Decreased activity tolerance;Cardiopulmonary status limiting activity OT Treatment Interventions: Self-care/ADL training;DME and/or AE instruction;Patient/family education;Therapeutic activities;Balance training   OT Goals(Current goals can be found in the care plan section) Acute Rehab OT Goals Patient Stated Goal: To get back home, I've got a lot of things to do. OT Goal Formulation: With patient Time For Goal Achievement: 10/20/12 Potential to Achieve Goals: Good  Visit Information  Last OT Received On: 10/06/12 History of Present Illness: Pt was admitted w/ dx Acute encephalopathy. He has a h/o COPD, family reported increased confusion at admit.        Prior Functioning     Home Living Family/patient expects to be discharged to:: Private residence Living Arrangements: Children Available Help at Discharge: Family Home Equipment: None Prior Function Level of Independence: Independent Communication Communication: No difficulties Dominant Hand: Right   Vision/Perception Vision - History Baseline Vision: Wears glasses only for reading Patient Visual Report: No change from baseline   Cognition  Cognition Arousal/Alertness: Awake/alert Behavior During Therapy: WFL for tasks assessed/performed Overall Cognitive Status: Within Functional Limits for tasks assessed    Extremity/Trunk Assessment Upper Extremity Assessment Upper  Extremity Assessment: Overall WFL for tasks assessed Lower Extremity Assessment Lower Extremity Assessment: Defer to PT evaluation Cervical / Trunk Assessment Cervical / Trunk Assessment: Normal  Mobility Bed Mobility Bed Mobility: Supine to Sit;Sitting - Scoot to [place] of Bed;Sit to Supine;Scooting to [facility] Supine to Sit: 6: Modified independent (Device/Increase time);With rails;HOB elevated Sitting - Scoot to Edge of Bed: 6: Modified independent (Device/Increase time);With rail Sit to Supine: 6: Modified independent (Device/Increase time);HOB elevated Scooting to HOB: 6: Modified independent (Device/Increase time) Transfers Transfers: Sit to Stand;Stand to Sit Sit to Stand: 5: Supervision;4: Min guard;From bed;From toilet Stand to Sit: 5: Supervision;4: Min guard;To bed;To toilet Details for Transfer Assistance: Pt reports initial dizziness with sit to stand, this resolves after static standing per his report.     Exercise     Balance     End of Session OT - End of Session Activity Tolerance: Patient tolerated treatment well Patient left: in bed;with call bell/phone within reach;Other (comment) (Vascular for LE dopplers present at conclusion of session.)  GO     Alm Bustard 10/06/2012, 9:23 AM

## 2012-10-06 NOTE — Progress Notes (Signed)
PT Cancellation Note  Patient Details Name: Jesse Hartman MRN: [id number] DOB: Jun 23, 1960   Cancelled Treatment:    Reason Eval/Treat Not Completed: Medical issues which prohibited therapy (pt declines, getting medication to help pt per RN.)   Rada Hay 10/06/2012, 2:44 PM

## 2012-10-07 DIAGNOSIS — I10 Essential (primary) hypertension: Secondary | ICD-10-CM

## 2012-10-07 MED ORDER — ALBUTEROL SULFATE HFA 108 (90 BASE) MCG/ACT IN AERS: 2.0000 | INHALATION_SPRAY | Freq: Four times a day (QID) | RESPIRATORY_TRACT | Status: DC | PRN

## 2012-10-07 MED ORDER — PREDNISONE 20 MG PO TABS: ORAL_TABLET | ORAL | Status: DC

## 2012-10-07 MED ORDER — AMOXICILLIN-POT CLAVULANATE 875-125 MG PO TABS
1.0000 | ORAL_TABLET | Freq: Two times a day (BID) | ORAL | Status: DC
  Administered 2012-10-07: 14:00:00 1 via ORAL
  Filled 2012-10-07 (×2): qty 1

## 2012-10-07 MED ORDER — FOLIC ACID 1 MG PO TABS: 1.0000 mg | ORAL_TABLET | Freq: Every day | ORAL | Status: DC

## 2012-10-07 MED ORDER — AMOXICILLIN-POT CLAVULANATE 875-125 MG PO TABS: 1.0000 | ORAL_TABLET | Freq: Two times a day (BID) | ORAL | Status: DC

## 2012-10-07 MED ORDER — CLONAZEPAM 1 MG PO TABS
1.0000 mg | ORAL_TABLET | Freq: Once | ORAL | Status: AC
  Administered 2012-10-07: 13:00:00 1 mg via ORAL
  Filled 2012-10-07: qty 1

## 2012-10-07 NOTE — Progress Notes (Signed)
D/C instructions reviewed w/ pt and dtr, all questions answered. No further questions. Rosalita Chessman, CM, has completed arrangements to have bipap delivered to pt's home tonight. Pt d/c in w/c in stable condition by NT. Pt in possession of d/c instructions, scripts, and all personal belongings.

## 2012-10-07 NOTE — Discharge Summary (Signed)
Physician Discharge Summary  [name] ZOX:[id number] DOB: Jul 24, 1960 DOA: 10/05/2012  PCP: Eartha Inch, MD  Admit date: 10/05/2012 Discharge date: 10/07/2012  Time spent: 35 minutes  Recommendations for Outpatient Follow-up:  1. Follow up with PCP in one week.    Discharge Diagnoses:  Principal Problem:   Acute encephalopathy Active Problems:   GERD   HTN (hypertension)   Chest pain   COPD (chronic obstructive pulmonary disease)   Discharge Condition: IMPROVED.   Diet recommendation: low sodium diet  Filed Weights   10/05/12 0821  Weight: 115.214 kg (254 lb)    History of present illness:  Jesse Hartman is a 52 y.o. male with known history of COPD, alcoholism, hypertension was recently admitted for COPD exacerbation was brought to the ER after patient's daughter found the patient was increasingly confused. Patient woke up in the midnight with chest discomfort and later was having increasing confusion. Patient was brought to the ER. CT head was negative for anything acute. Patient previously got better but still is somnolent. Patient's daughter feels that he may be taking more than required Klonopin. Patient also has positive for alcohol. Patient states that he has been having some chest discomfort like a burning sensation with also some throat discomfort. Patient otherwise denies any nausea vomiting abdominal pain or diarrhea fever chills. Patient will be admitted for further management.   Hospital Course:  1. Acute encephalopathy - resolved. probably secondary to combination of alcohol and Klonopin. Patient is mildly hypoxic but not in distress and is maintaining saturation with oxygen.   Atypical Chest pain - suspect this is most likely secondary to esophagitis from alcoholism. But at this time we will cycle cardiac markers to rule out ACS cardiac enzymes are negative . D dimer elevated, and his creatinine was 1.5, did v/q scan and negative for PE.    Alcohol abuse -  patient has been placed on Ativan withdrawal protocol. No withdrawal symptoms seen.    COPD/asthma - he was wheezing and we have started him on IV solumedrol. Later we transitioned to po prednisone. Patient mildly hypoxic but able to maintain saturation with oxygen. Patient does have history of OSA and we have ordered CPAP machine.   Hypertension - continue present medications.   Procedures:  none  Consultations:  none  Discharge Exam: Filed Vitals:   10/07/12 1338  BP: 133/85  Pulse: 89  Temp: 98.1 F (36.7 C)  Resp: 18    General: alerta febrile comfortable  Cardiovascular: s1s2  Respiratory: no wheezing heard Abdomen: soft NT ND BS+  Musculoskeletal: no pedal edema   Discharge Instructions  Discharge Orders   Future Orders Complete By Expires   Diet - low sodium heart healthy  As directed    Discharge instructions  As directed    Comments:     Follow up with PCP inone week.       Medication List    STOP taking these medications       naproxen sodium 220 MG tablet  Commonly known as:  ANAPROX     olmesartan 20 MG tablet  Commonly known as:  BENICAR      TAKE these medications       albuterol 108 (90 BASE) MCG/ACT inhaler  Commonly known as:  PROVENTIL HFA;VENTOLIN HFA  Inhale 2 puffs into the lungs every 6 (six) hours as needed for wheezing.     amoxicillin-clavulanate 875-125 MG per tablet  Commonly known as:  AUGMENTIN  Take 1 tablet by  mouth every 12 (twelve) hours.     AZOR 10-40 MG per tablet  Generic drug:  amLODipine-olmesartan  Take 1 tablet by mouth daily. Lot [id number]  Exp 04/06/2015     clonazePAM 1 MG tablet  Commonly known as:  KLONOPIN  Take 2 mg by mouth 2 (two) times daily as needed for anxiety.     folic acid 1 MG tablet  Commonly known as:  FOLVITE  Take 1 tablet (1 mg total) by mouth daily.     glucosamine-chondroitin 500-400 MG tablet  Take 1 tablet by mouth daily.     guaiFENesin 600 MG 12 hr tablet  Commonly known  as:  MUCINEX  Take 1,200 mg by mouth 2 (two) times daily as needed for congestion.     HYDROcodone-acetaminophen 10-325 MG per tablet  Commonly known as:  NORCO  Take 1-2 tablets by mouth every 6 (six) hours as needed for pain.     metoprolol succinate 25 MG 24 hr tablet  Commonly known as:  TOPROL-XL  Take 25 mg by mouth daily.     MSM PO  Take 1,500 mg by mouth daily.     omeprazole 40 MG capsule  Commonly known as:  PRILOSEC  Take 40 mg by mouth daily.     predniSONE 20 MG tablet  Commonly known as:  DELTASONE  - PREDNISONE 60 mg daily for 3 days followed   - Prednisone 40 mg daily for 3 days followed by  - Prednisone 30 mg daily for 3 days. Followed by  - Prednisone 20 mg daily for 3 days followed by  - Prednisone 10 mg daily for 3 days.     tiotropium 18 MCG inhalation capsule  Commonly known as:  SPIRIVA  Place 18 mcg into inhaler and inhale daily.       No Known Allergies     Follow-up Information   Follow up with BADGER,MICHAEL C, MD. Schedule an appointment as soon as possible for a visit in 1 week.   Specialty:  Family Medicine   Contact information:   [address] [place] [address] [phone]       Please follow up. (Walk-in clinic Monday-Friday 8am-12pm)        The results of significant diagnostics from this hospitalization (including imaging, microbiology, ancillary and laboratory) are listed below for reference.    Significant Diagnostic Studies: Ct Head Wo Contrast  10/05/2012   CLINICAL DATA:  Altered mental status.  EXAM: CT HEAD WITHOUT CONTRAST  TECHNIQUE: Contiguous axial images were obtained from the base of the skull through the vertex without intravenous contrast.  COMPARISON:  08/01/2011  FINDINGS: No acute intracranial abnormality. Specifically, no hemorrhage, hydrocephalus, mass lesion, acute infarction, or significant intracranial injury. No acute calvarial abnormality.  Mucosal thickening in the left maxillary sinus.  No air-fluid levels. Mastoids are clear.  IMPRESSION: No intracranial abnormality.   Electronically Signed   By: Charlett Nose M.D.   On: 10/05/2012 05:57   Nm Pulmonary Perf And Vent  10/05/2012   *RADIOLOGY REPORT*  Clinical Data:  Shortness of breath, chest pain, elevated D-dimer, history of COPD, evaluate for pulmonary embolism, initial encounter.  NUCLEAR MEDICINE VENTILATION - PERFUSION LUNG SCAN  Technique:  Ventilation images were obtained using aerosolized technetium 24m DTPA.  Perfusion images were obtained in multiple projections after intravenous injection of Tc-42m MAA.  Radiopharmaceuticals:  Aerosolized 45 mCi technetium 90m DTPA and 5.7 mCi Tc-74m MAA.  Comparison:  Chest radiograph - 10/05/2012; 09/23/2012  Findings:  Review of chest radiograph performed earlier same day demonstrates borderline enlarged cardiac silhouette.  Decreased lung volumes with bibasilar opacities favored to represent atelectasis.  No definite pleural effusion or pneumothorax.  No definite evidence of edema.  Ventilatory images: There is mild mottled aeration of the bilateral lungs, left greater than right.  There is clumping of inhaled radiotracer about the bilateral pulmonary hila.  There is a minimal amount of ingested radiotracer within the hypopharynx and superior/mid aspect of the esophagus.  Perfusion images:  There is relative homogeneous distribution of administered radiotracer without discrete mismatched segmental or subsegmental defect to suggest pulmonary embolism.  IMPRESSION: Pulmonary embolism absent (very low probability for pulmonary embolism).   Original Report Authenticated By: Tacey Ruiz, MD   Dg Chest Portable 1 View  10/05/2012   CLINICAL DATA:  Altered mental status, shortness of Breath.  EXAM: PORTABLE CHEST - 1 VIEW  COMPARISON:  09/23/2012  FINDINGS: Low lung volumes. Cardiomegaly with vascular congestion and suspected mild interstitial edema. Bibasilar atelectasis. Possible small effusions.  No definite acute bony abnormality.  IMPRESSION: Low lung volumes. Cardiomegaly with vascular congestion and possible early interstitial edema.   Electronically Signed   By: Charlett Nose M.D.   On: 10/05/2012 02:15   Dg Chest Port 1 View  09/23/2012   CLINICAL DATA:  Shortness of breath  EXAM: PORTABLE CHEST - 1 VIEW  COMPARISON:  08/05/2012  FINDINGS: No cardiomegaly. Convexity of the upper right mediastinum unchanged from priors, especially when correlating with chest CT 05/06/2009. No infiltrate, edema, effusion, or pneumothorax. Remote mid left clavicle fracture.  IMPRESSION: No active disease.   Electronically Signed   By: Tiburcio Pea   On: 09/23/2012 02:04    Microbiology: No results found for this or any previous visit (from the past 240 hour(s)).   Labs: Basic Metabolic Panel:  Recent Labs Lab 10/05/12 0159 10/05/12 0216 10/05/12 1451 10/06/12 0453  NA 132* 133*  --  133*  K 3.9 3.9  --  4.5  CL 92* 96  --  96  CO2 27  --   --  30  GLUCOSE 107* 103*  --  111*  BUN 17 16  --  17  CREATININE 1.11 1.50* 1.07 1.00  CALCIUM 11.3*  --   --  9.3   Liver Function Tests:  Recent Labs Lab 10/05/12 0159  AST 18  ALT 22  ALKPHOS 38*  BILITOT 0.4  PROT 6.4  ALBUMIN 3.3*    Recent Labs Lab 10/05/12 0159  LIPASE 38    Recent Labs Lab 10/05/12 0825  AMMONIA 30   CBC:  Recent Labs Lab 10/05/12 0159 10/05/12 0216 10/06/12 0453  WBC 10.1  --  10.1  HGB 13.7 14.3 13.3  HCT 40.1 42.0 38.3*  MCV 96.4  --  97.0  PLT 188  --  172   Cardiac Enzymes:  Recent Labs Lab 10/05/12 0159 10/05/12 0825 10/05/12 1346 10/05/12 1958  TROPONINI <0.30 <0.30 <0.30 <0.30   BNP: BNP (last 3 results)  Recent Labs  09/24/12 0530 10/05/12 0159  PROBNP 216.7* 74.9   CBG: No results found for this basename: GLUCAP,  in the last 168 hours     Signed:  Damyan Corne  [facility] 10/07/2012, 1:58 PM

## 2012-10-07 NOTE — Progress Notes (Signed)
Clinical Social Work  CSW met with patient and dtr at bedside. Patient reports he is DC today and has no further CSW needs. CSW is signing off but available if further needs arise.  Unk Lightning, LCSW (Coverage for [employer])

## 2012-10-07 NOTE — Evaluation (Signed)
Physical Therapy Evaluation Patient Details Name: Jesse Hartman MRN: [id number] DOB: 03/05/1960 Today's Date: 10/07/2012 Time: 4098-1191 PT Time Calculation (min): 20 min  PT Assessment / Plan / Recommendation History of Present Illness  Pt was admitted w/ dx Acute encephalopathy. He has a h/o COPD, family reported increased confusion at admit.   Clinical Impression  Pt MOD I/S with mobility with no LOB noted.  Pt's o2 sats 89%-92% with gait over 200 feet on room air without assistive device.    PT Assessment  Patent does not need any further PT services    Follow Up Recommendations  No PT follow up    Does the patient have the potential to tolerate intense rehabilitation      Barriers to Discharge  Pt is anxious about getting home due to needing to pay rent.      Equipment Recommendations  None recommended by PT    Recommendations for Other Services     Frequency      Precautions / Restrictions Restrictions Weight Bearing Restrictions: No   Pertinent Vitals/Pain Chronic head and chest pain 5/10      Mobility  Bed Mobility Supine to Sit: 6: Modified independent (Device/Increase time) Sitting - Scoot to Edge of Bed: 7: Independent Transfers Sit to Stand: 6: Modified independent (Device/Increase time) Stand to Sit: 6: Modified independent (Device/Increase time) Details for Transfer Assistance: Pt reports minimal amount of dizziness upon standing, but it dissipted quickly. Ambulation/Gait Ambulation/Gait Assistance: 6: Modified independent (Device/Increase time);5: Supervision Ambulation Distance (Feet): 250 Feet Assistive device: None Ambulation/Gait Assistance Details: 1 short standing rest break.  O2 92% for first half of gait and decreased to 89% second half on room air Gait Pattern: Within Functional Limits    Exercises     PT Diagnosis:    PT Problem List:   PT Treatment Interventions:       PT Goals(Current goals can be found in the care plan  section) Acute Rehab PT Goals Patient Stated Goal: "I've got to get home today or I won't have a place to go to."  Visit Information  Last PT Received On: 10/07/12 Assistance Needed: +1 History of Present Illness: Pt was admitted w/ dx Acute encephalopathy. He has a h/o COPD, family reported increased confusion at admit.        Prior Functioning  Home Living Family/patient expects to be discharged to:: Private residence Living Arrangements: Children Available Help at Discharge: Family Home Access: Stairs to enter Secretary/administrator of Steps: 3 Entrance Stairs-Rails: Right Home Layout: One level Home Equipment: None Prior Function Level of Independence: Independent Communication Communication: No difficulties Dominant Hand: Right    Cognition  Cognition Arousal/Alertness: Awake/alert Behavior During Therapy: WFL for tasks assessed/performed Overall Cognitive Status: Within Functional Limits for tasks assessed    Extremity/Trunk Assessment Lower Extremity Assessment Lower Extremity Assessment: Overall WFL for tasks assessed   Balance Balance Balance Assessed: Yes Dynamic Standing Balance Dynamic Standing - Level of Assistance: 6: Modified independent (Device/Increase time)  End of Session PT - End of Session Activity Tolerance: Patient tolerated treatment well Patient left: in chair;Other (comment) (Respiratory therapist in room) Nurse Communication: Mobility status  GP     [facility] LUBECK 10/07/2012, 9:16 AM

## 2012-11-07 ENCOUNTER — Other Ambulatory Visit: Payer: Self-pay | Admitting: Internal Medicine

## 2012-11-08 ENCOUNTER — Other Ambulatory Visit: Payer: Self-pay | Admitting: Internal Medicine

## 2012-12-19 ENCOUNTER — Encounter (HOSPITAL_COMMUNITY): Payer: Self-pay | Admitting: Emergency Medicine

## 2012-12-19 ENCOUNTER — Inpatient Hospital Stay (HOSPITAL_COMMUNITY)
Admission: EM | Admit: 2012-12-19 | Discharge: 2012-12-21 | DRG: 100 | Disposition: A | Discharge status: Home / Self Care | Payer: Medicaid Other | Attending: Internal Medicine | Admitting: Internal Medicine

## 2012-12-19 DIAGNOSIS — R4182 Altered mental status, unspecified: Secondary | ICD-10-CM | POA: Diagnosis present

## 2012-12-19 DIAGNOSIS — G4730 Sleep apnea, unspecified: Secondary | ICD-10-CM | POA: Diagnosis present

## 2012-12-19 DIAGNOSIS — I10 Essential (primary) hypertension: Secondary | ICD-10-CM | POA: Diagnosis present

## 2012-12-19 DIAGNOSIS — R4189 Other symptoms and signs involving cognitive functions and awareness: Secondary | ICD-10-CM | POA: Diagnosis present

## 2012-12-19 DIAGNOSIS — R079 Chest pain, unspecified: Secondary | ICD-10-CM

## 2012-12-19 DIAGNOSIS — J4489 Other specified chronic obstructive pulmonary disease: Secondary | ICD-10-CM | POA: Diagnosis present

## 2012-12-19 DIAGNOSIS — F17200 Nicotine dependence, unspecified, uncomplicated: Secondary | ICD-10-CM | POA: Diagnosis present

## 2012-12-19 DIAGNOSIS — J449 Chronic obstructive pulmonary disease, unspecified: Secondary | ICD-10-CM | POA: Diagnosis present

## 2012-12-19 DIAGNOSIS — G9340 Encephalopathy, unspecified: Secondary | ICD-10-CM | POA: Diagnosis present

## 2012-12-19 DIAGNOSIS — Z86718 Personal history of other venous thrombosis and embolism: Secondary | ICD-10-CM

## 2012-12-19 DIAGNOSIS — K219 Gastro-esophageal reflux disease without esophagitis: Secondary | ICD-10-CM | POA: Diagnosis present

## 2012-12-19 DIAGNOSIS — R569 Unspecified convulsions: Principal | ICD-10-CM | POA: Diagnosis present

## 2012-12-19 DIAGNOSIS — F1010 Alcohol abuse, uncomplicated: Secondary | ICD-10-CM | POA: Diagnosis present

## 2012-12-19 DIAGNOSIS — J441 Chronic obstructive pulmonary disease with (acute) exacerbation: Secondary | ICD-10-CM

## 2012-12-19 DIAGNOSIS — M129 Arthropathy, unspecified: Secondary | ICD-10-CM | POA: Diagnosis present

## 2012-12-19 DIAGNOSIS — F10929 Alcohol use, unspecified with intoxication, unspecified: Secondary | ICD-10-CM

## 2012-12-19 DIAGNOSIS — K2270 Barrett's esophagus without dysplasia: Secondary | ICD-10-CM | POA: Diagnosis present

## 2012-12-19 DIAGNOSIS — J9601 Acute respiratory failure with hypoxia: Secondary | ICD-10-CM

## 2012-12-19 LAB — GLUCOSE, CAPILLARY: Glucose-Capillary: 131 mg/dL — ABNORMAL HIGH (ref 70–99)

## 2012-12-19 NOTE — ED Notes (Signed)
Per EMS, patient stopped breathing and turned blue and friends could not find pulse. Friends initiated 2 cycles of CPR. Upon EMS arrival patient went into respiratory arrest and turned blue and had a period of apnea. Patient also had an episode of emesis and incontinence. EMS reports patient's HR in the 30's.

## 2012-12-20 ENCOUNTER — Inpatient Hospital Stay (HOSPITAL_COMMUNITY): Payer: Medicaid Other

## 2012-12-20 ENCOUNTER — Emergency Department (HOSPITAL_COMMUNITY): Payer: Medicaid Other

## 2012-12-20 DIAGNOSIS — I10 Essential (primary) hypertension: Secondary | ICD-10-CM

## 2012-12-20 DIAGNOSIS — R4182 Altered mental status, unspecified: Secondary | ICD-10-CM

## 2012-12-20 DIAGNOSIS — G4730 Sleep apnea, unspecified: Secondary | ICD-10-CM

## 2012-12-20 DIAGNOSIS — R404 Transient alteration of awareness: Secondary | ICD-10-CM | POA: Diagnosis present

## 2012-12-20 DIAGNOSIS — R4189 Other symptoms and signs involving cognitive functions and awareness: Secondary | ICD-10-CM | POA: Diagnosis present

## 2012-12-20 DIAGNOSIS — F1010 Alcohol abuse, uncomplicated: Secondary | ICD-10-CM | POA: Diagnosis present

## 2012-12-20 DIAGNOSIS — G9340 Encephalopathy, unspecified: Secondary | ICD-10-CM

## 2012-12-20 DIAGNOSIS — K2270 Barrett's esophagus without dysplasia: Secondary | ICD-10-CM

## 2012-12-20 LAB — CBC WITH DIFFERENTIAL/PLATELET
Basophils Absolute: 0 10*3/uL (ref 0.0–0.1)
Basophils Relative: 1 % (ref 0–1)
HCT: 43.8 % (ref 39.0–52.0)
Lymphocytes Relative: 36 % (ref 12–46)
MCHC: 35.6 g/dL (ref 30.0–36.0)
Monocytes Absolute: 0.6 10*3/uL (ref 0.1–1.0)
Neutro Abs: 3.5 10*3/uL (ref 1.7–7.7)
Neutrophils Relative %: 50 % (ref 43–77)
Platelets: 205 10*3/uL (ref 150–400)
RDW: 12.5 % (ref 11.5–15.5)
WBC: 6.9 10*3/uL (ref 4.0–10.5)

## 2012-12-20 LAB — GLUCOSE, CAPILLARY: Glucose-Capillary: 160 mg/dL — ABNORMAL HIGH (ref 70–99)

## 2012-12-20 LAB — COMPREHENSIVE METABOLIC PANEL
ALT: 18 U/L (ref 0–53)
AST: 17 U/L (ref 0–37)
Albumin: 3.4 g/dL — ABNORMAL LOW (ref 3.5–5.2)
Alkaline Phosphatase: 61 U/L (ref 39–117)
BUN: 9 mg/dL (ref 6–23)
CO2: 23 mEq/L (ref 19–32)
Chloride: 96 mEq/L (ref 96–112)
Creatinine, Ser: 0.78 mg/dL (ref 0.50–1.35)
Potassium: 3.6 mEq/L (ref 3.5–5.1)
Sodium: 132 mEq/L — ABNORMAL LOW (ref 135–145)
Total Bilirubin: 0.4 mg/dL (ref 0.3–1.2)

## 2012-12-20 LAB — URINALYSIS, ROUTINE W REFLEX MICROSCOPIC
Bilirubin Urine: NEGATIVE
Glucose, UA: NEGATIVE mg/dL
Hgb urine dipstick: NEGATIVE
Ketones, ur: NEGATIVE mg/dL
Leukocytes, UA: NEGATIVE
Urobilinogen, UA: 0.2 mg/dL (ref 0.0–1.0)
pH: 6.5 (ref 5.0–8.0)

## 2012-12-20 LAB — RAPID URINE DRUG SCREEN, HOSP PERFORMED
Amphetamines: NOT DETECTED
Benzodiazepines: NOT DETECTED
Opiates: NOT DETECTED
Tetrahydrocannabinol: POSITIVE — AB

## 2012-12-20 LAB — TROPONIN I: Troponin I: <0.3 ng/mL (ref <0.30)

## 2012-12-20 LAB — POCT I-STAT 3, ART BLOOD GAS (G3+)
Acid-base deficit: 2 mmol/L (ref 0.0–2.0)
Bicarbonate: 23.8 mEq/L (ref 20.0–24.0)
O2 Saturation: 95 %
pO2, Arterial: 79 mmHg — ABNORMAL LOW (ref 80.0–100.0)

## 2012-12-20 LAB — ETHANOL: Alcohol, Ethyl (B): 76 mg/dL — ABNORMAL HIGH (ref 0–11)

## 2012-12-20 MED ORDER — IRBESARTAN 300 MG PO TABS
300.0000 mg | ORAL_TABLET | Freq: Every day | ORAL | Status: DC
  Administered 2012-12-20 – 2012-12-21 (×2): 300 mg via ORAL
  Filled 2012-12-20 (×2): qty 1

## 2012-12-20 MED ORDER — ONDANSETRON HCL 4 MG/2ML IJ SOLN: 4.0000 mg | Freq: Four times a day (QID) | INTRAMUSCULAR | Status: DC | PRN

## 2012-12-20 MED ORDER — LORAZEPAM 2 MG/ML IJ SOLN
0.0000 mg | Freq: Four times a day (QID) | INTRAMUSCULAR | Status: DC
  Administered 2012-12-20: 1 mg via INTRAVENOUS
  Administered 2012-12-20: 2 mg via INTRAVENOUS
  Administered 2012-12-21: 1 mg via INTRAVENOUS
  Filled 2012-12-20 (×2): qty 1

## 2012-12-20 MED ORDER — THIAMINE HCL 100 MG/ML IJ SOLN
Freq: Once | INTRAVENOUS | Status: AC
  Administered 2012-12-20: 09:00:00 via INTRAVENOUS
  Filled 2012-12-20: qty 1000

## 2012-12-20 MED ORDER — PANTOPRAZOLE SODIUM 40 MG PO TBEC
40.0000 mg | DELAYED_RELEASE_TABLET | Freq: Every day | ORAL | Status: DC
  Administered 2012-12-20 – 2012-12-21 (×2): 40 mg via ORAL
  Filled 2012-12-20 (×2): qty 1

## 2012-12-20 MED ORDER — LORAZEPAM 1 MG PO TABS
1.0000 mg | ORAL_TABLET | Freq: Four times a day (QID) | ORAL | Status: DC | PRN
  Administered 2012-12-21: 1 mg via ORAL
  Filled 2012-12-20: qty 1

## 2012-12-20 MED ORDER — ADULT MULTIVITAMIN W/MINERALS CH
1.0000 | ORAL_TABLET | Freq: Every day | ORAL | Status: DC
  Administered 2012-12-21: 1 via ORAL
  Filled 2012-12-20: qty 1

## 2012-12-20 MED ORDER — SODIUM CHLORIDE 0.9 % IJ SOLN
3.0000 mL | Freq: Two times a day (BID) | INTRAMUSCULAR | Status: DC
  Administered 2012-12-20 (×2): 3 mL via INTRAVENOUS
  Filled 2012-12-20: qty 3

## 2012-12-20 MED ORDER — LORAZEPAM 2 MG/ML IJ SOLN: 2.0000 mg | INTRAMUSCULAR | Status: DC | PRN

## 2012-12-20 MED ORDER — ACETAMINOPHEN 325 MG PO TABS
650.0000 mg | ORAL_TABLET | Freq: Four times a day (QID) | ORAL | Status: DC | PRN
  Administered 2012-12-20: 650 mg via ORAL
  Filled 2012-12-20: qty 2

## 2012-12-20 MED ORDER — FOLIC ACID 1 MG PO TABS
1.0000 mg | ORAL_TABLET | Freq: Every day | ORAL | Status: DC
  Administered 2012-12-21: 1 mg via ORAL
  Filled 2012-12-20: qty 1

## 2012-12-20 MED ORDER — ALBUTEROL SULFATE HFA 108 (90 BASE) MCG/ACT IN AERS: 2.0000 | INHALATION_SPRAY | Freq: Four times a day (QID) | RESPIRATORY_TRACT | Status: DC | PRN

## 2012-12-20 MED ORDER — LORAZEPAM 2 MG/ML IJ SOLN
1.0000 mg | Freq: Four times a day (QID) | INTRAMUSCULAR | Status: DC | PRN
  Filled 2012-12-20: qty 1

## 2012-12-20 MED ORDER — THIAMINE HCL 100 MG/ML IJ SOLN
100.0000 mg | Freq: Every day | INTRAMUSCULAR | Status: DC
  Filled 2012-12-20: qty 1

## 2012-12-20 MED ORDER — LORAZEPAM 2 MG/ML IJ SOLN: 0.0000 mg | Freq: Two times a day (BID) | INTRAMUSCULAR | Status: DC

## 2012-12-20 MED ORDER — HEPARIN SODIUM (PORCINE) 5000 UNIT/ML IJ SOLN
5000.0000 [IU] | Freq: Three times a day (TID) | INTRAMUSCULAR | Status: DC
  Administered 2012-12-20 – 2012-12-21 (×3): 5000 [IU] via SUBCUTANEOUS
  Filled 2012-12-20 (×6): qty 1

## 2012-12-20 MED ORDER — AMLODIPINE-OLMESARTAN 10-40 MG PO TABS: 1.0000 | ORAL_TABLET | Freq: Every day | ORAL | Status: DC

## 2012-12-20 MED ORDER — VITAMIN B-1 100 MG PO TABS
100.0000 mg | ORAL_TABLET | Freq: Every day | ORAL | Status: DC
  Administered 2012-12-21: 100 mg via ORAL
  Filled 2012-12-20: qty 1

## 2012-12-20 MED ORDER — TIOTROPIUM BROMIDE MONOHYDRATE 18 MCG IN CAPS
18.0000 ug | ORAL_CAPSULE | Freq: Every day | RESPIRATORY_TRACT | Status: DC
  Administered 2012-12-20 – 2012-12-21 (×2): 18 ug via RESPIRATORY_TRACT
  Filled 2012-12-20: qty 5

## 2012-12-20 MED ORDER — AMLODIPINE BESYLATE 10 MG PO TABS
10.0000 mg | ORAL_TABLET | Freq: Every day | ORAL | Status: DC
  Administered 2012-12-20 – 2012-12-21 (×2): 10 mg via ORAL
  Filled 2012-12-20 (×2): qty 1

## 2012-12-20 MED ORDER — HYDROCODONE-ACETAMINOPHEN 5-325 MG PO TABS: 1.0000 | ORAL_TABLET | Freq: Four times a day (QID) | ORAL | Status: DC | PRN

## 2012-12-20 MED ORDER — ONDANSETRON HCL 4 MG PO TABS: 4.0000 mg | ORAL_TABLET | Freq: Four times a day (QID) | ORAL | Status: DC | PRN

## 2012-12-20 NOTE — ED Notes (Signed)
Daughter-- Gearldine Bienenstock-- [phone]

## 2012-12-20 NOTE — Progress Notes (Addendum)
Patient admitted after midnight.  Chart reviewed. Patient examined. He remains in the ER, as there are no step down beds. He is alert and oriented, cooperative, non-tremulous. I suspect he may have had an alcohol related seizure. He also takes Clonopin but urine drug screen is negative for benzodiazepines. He reports that he last took Klonopin yesterday. Will down grade to telemetry. Patient minimizes his alcohol intake. H&P reports he drank 18 beers or so. Has had previous hospitalizations for alcoholic pancreatitis. Does admit to drinking about a beer an hour, but does not feel this represents a problem. Doubt he is interested in quitting. He is currently complaining of chest pain from CPR done by his friend.  Crista Curb, M.D. [facility] [phone]

## 2012-12-20 NOTE — ED Notes (Signed)
Pt states has had 2 other episodes of "stopping breathing" in this year. Was watching football with friends and "stopped breathing-friends started CPR"

## 2012-12-20 NOTE — Progress Notes (Signed)
EEG Completed; Results Pending  

## 2012-12-20 NOTE — ED Notes (Addendum)
PATIENT HAS GONE TO EEG. HE WILL THEN GO TO HIS ROOM ON 3W. REPORT HAS BEEN CALLED TO [facility] ON 3W. PT SHOES AND BAG OF CLOTHING AND GLASSES AND KEYS SENT WITH PT . SPOKE TO EEG AND THEY WILL TRANSPORT PT TO HIS ROOM ON 3W WHEN EEG COMPLETED

## 2012-12-20 NOTE — ED Notes (Signed)
Patient unable to give a urine sample at this time. Patient is a little nauseous, chest pain and a headache.

## 2012-12-20 NOTE — ED Notes (Signed)
Admitting doctor at bedside 
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
## 2012-12-20 NOTE — ED Notes (Signed)
PAtient called out and stated he thought he was having a seizure. RN into room-- patient is alert and oriented x4, and answers all questions appropriately. Airway is intact and patient is in no distress. No seizure activity noted.

## 2012-12-20 NOTE — Procedures (Signed)
ELECTROENCEPHALOGRAM REPORT   Patient: Jesse Hartman       Room #: ED-OTF EEG No. ID: [id number] Age: 52 y.o.        Sex: male Referring Physician: Lendell Caprice Report Date:  12/20/2012        Interpreting Physician: Aline Brochure  History: Jesse Hartman is an 52 y.o. male who presented following an episode of loss of consciousness with associated bradycardia. Patient also reportedly drank about 18 beers prior to losing consciousness. Blood alcohol level is 76.  Indications for study:  Rule out seizure disorder.  Technique: This is an 18 channel routine scalp EEG performed at the bedside with bipolar and monopolar montages arranged in accordance to the international 10/20 system of electrode placement.   Description: See EEG recording was performed during wakefulness. The background activity consisted of 8 Hz symmetrical alpha rhythm which attenuated well with eye opening. Photic stimulation produced a symmetrical occipital driving response. Hyperventilation was not performed. No epileptiform discharges were recorded.  Interpretation: This EEG is abnormal with mild generalized nonspecific continuous slowing of cerebral activity. This pattern of slowing can be seen with alcohol intoxication as well as metabolic and degenerative encephalopathies. No evidence of an epileptic disorder was demonstrated.   Venetia Maxon M.D. [facility] [phone]

## 2012-12-20 NOTE — ED Notes (Signed)
Call Deke [phone] if anything changes.

## 2012-12-20 NOTE — ED Notes (Signed)
Pt awake and alert. States his chest is very sore. Sinus rhythm on the monitor. Lungs clear. He is aware he is waiting for a bed upstairs and is agreeable

## 2012-12-20 NOTE — ED Notes (Signed)
Urinal handed to pt to use

## 2012-12-20 NOTE — ED Notes (Signed)
Pt states that he does not drink everyday, "maybe 5 out of 7. I don't feel that it is a problem" c/o midsternal chest pain with coughing. On pain scale- states -- "general pain all over my body is a 6. In chest 8/10" c/o feeling weak-- for "a couple years" Is supposed to use C-Pap at night but states "I cannot afford it-my medicaid got cancelled. I also am having blurry vision that is getting worse"

## 2012-12-20 NOTE — ED Notes (Signed)
Patient is resting comfortably. No acute distress at this time. Respirations e/u.

## 2012-12-20 NOTE — ED Notes (Signed)
HOSPITALIST PAGED FOR FURTHER ORDERS

## 2012-12-20 NOTE — ED Notes (Signed)
Pt given urinal, pt called out to nurses station "I need to urinate really bad".

## 2012-12-20 NOTE — Progress Notes (Signed)
Utilization review completed.  
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
## 2012-12-21 DIAGNOSIS — R079 Chest pain, unspecified: Secondary | ICD-10-CM

## 2012-12-21 DIAGNOSIS — J449 Chronic obstructive pulmonary disease, unspecified: Secondary | ICD-10-CM

## 2012-12-21 LAB — CBC
HCT: 44.4 % (ref 39.0–52.0)
Hemoglobin: 15.4 g/dL (ref 13.0–17.0)
MCH: 34.4 pg — ABNORMAL HIGH (ref 26.0–34.0)
MCHC: 34.7 g/dL (ref 30.0–36.0)
MCV: 99.1 fL (ref 78.0–100.0)
Platelets: 195 10*3/uL (ref 150–400)
RBC: 4.48 MIL/uL (ref 4.22–5.81)
RDW: 12.7 % (ref 11.5–15.5)
WBC: 9.9 10*3/uL (ref 4.0–10.5)

## 2012-12-21 LAB — COMPREHENSIVE METABOLIC PANEL
ALT: 16 U/L (ref 0–53)
AST: 14 U/L (ref 0–37)
Albumin: 3.3 g/dL — ABNORMAL LOW (ref 3.5–5.2)
Alkaline Phosphatase: 55 U/L (ref 39–117)
BUN: 10 mg/dL (ref 6–23)
CO2: 26 mEq/L (ref 19–32)
Calcium: 9 mg/dL (ref 8.4–10.5)
Chloride: 103 mEq/L (ref 96–112)
Creatinine, Ser: 0.74 mg/dL (ref 0.50–1.35)
GFR calc Af Amer: >90 mL/min (ref >90)
GFR calc non Af Amer: >90 mL/min (ref >90)
Glucose, Bld: 101 mg/dL — ABNORMAL HIGH (ref 70–99)
Potassium: 4 mEq/L (ref 3.5–5.1)
Sodium: 137 mEq/L (ref 135–145)
Total Bilirubin: 0.4 mg/dL (ref 0.3–1.2)
Total Protein: 6.5 g/dL (ref 6.0–8.3)

## 2012-12-21 LAB — PROTIME-INR: INR: 0.99 (ref 0.00–1.49)

## 2012-12-21 LAB — GLUCOSE, CAPILLARY: Glucose-Capillary: 121 mg/dL — ABNORMAL HIGH (ref 70–99)

## 2012-12-21 MED ORDER — LORAZEPAM 1 MG PO TABS: 1.0000 mg | ORAL_TABLET | Freq: Once | ORAL | Status: DC

## 2012-12-21 MED ORDER — ACETAMINOPHEN 325 MG PO TABS: 650.0000 mg | ORAL_TABLET | Freq: Four times a day (QID) | ORAL | Status: DC | PRN

## 2012-12-21 MED ORDER — ALBUTEROL SULFATE HFA 108 (90 BASE) MCG/ACT IN AERS: 2.0000 | INHALATION_SPRAY | Freq: Four times a day (QID) | RESPIRATORY_TRACT | Status: DC | PRN
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
## 2012-12-21 NOTE — Progress Notes (Signed)
Pt provided with dc instructions and education. Pt aware that he should not drive until cleared by MD. Pt refused one time dose of ativan that Dr. Lendell Caprice ordered stating "I have my klonopin at home and dont need this stuff that only works like tylenol for me". IV removed with tip intact. Heart monitor cleaned and returned to front. Levonne Spiller, RN
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
## 2013-01-19 ENCOUNTER — Emergency Department (HOSPITAL_COMMUNITY): Payer: Medicaid Other

## 2013-01-19 ENCOUNTER — Emergency Department (HOSPITAL_COMMUNITY)
Admission: EM | Admit: 2013-01-19 | Discharge: 2013-01-19 | Disposition: A | Discharge status: Home / Self Care | Payer: Medicaid Other | Source: Home / Self Care | Attending: Emergency Medicine | Admitting: Emergency Medicine

## 2013-01-19 ENCOUNTER — Encounter (HOSPITAL_COMMUNITY): Payer: Self-pay | Admitting: Emergency Medicine

## 2013-01-19 DIAGNOSIS — S32009A Unspecified fracture of unspecified lumbar vertebra, initial encounter for closed fracture: Secondary | ICD-10-CM | POA: Diagnosis present

## 2013-01-19 DIAGNOSIS — J4489 Other specified chronic obstructive pulmonary disease: Secondary | ICD-10-CM | POA: Diagnosis present

## 2013-01-19 DIAGNOSIS — Z9889 Other specified postprocedural states: Secondary | ICD-10-CM | POA: Insufficient documentation

## 2013-01-19 DIAGNOSIS — Z862 Personal history of diseases of the blood and blood-forming organs and certain disorders involving the immune mechanism: Secondary | ICD-10-CM | POA: Insufficient documentation

## 2013-01-19 DIAGNOSIS — S2239XA Fracture of one rib, unspecified side, initial encounter for closed fracture: Secondary | ICD-10-CM

## 2013-01-19 DIAGNOSIS — Z79899 Other long term (current) drug therapy: Secondary | ICD-10-CM | POA: Insufficient documentation

## 2013-01-19 DIAGNOSIS — T07XXXA Unspecified multiple injuries, initial encounter: Secondary | ICD-10-CM

## 2013-01-19 DIAGNOSIS — J441 Chronic obstructive pulmonary disease with (acute) exacerbation: Secondary | ICD-10-CM | POA: Insufficient documentation

## 2013-01-19 DIAGNOSIS — M129 Arthropathy, unspecified: Secondary | ICD-10-CM | POA: Diagnosis present

## 2013-01-19 DIAGNOSIS — F17200 Nicotine dependence, unspecified, uncomplicated: Secondary | ICD-10-CM

## 2013-01-19 DIAGNOSIS — Z86718 Personal history of other venous thrombosis and embolism: Secondary | ICD-10-CM

## 2013-01-19 DIAGNOSIS — K219 Gastro-esophageal reflux disease without esophagitis: Secondary | ICD-10-CM | POA: Insufficient documentation

## 2013-01-19 DIAGNOSIS — S20229A Contusion of unspecified back wall of thorax, initial encounter: Secondary | ICD-10-CM

## 2013-01-19 DIAGNOSIS — K2270 Barrett's esophagus without dysplasia: Secondary | ICD-10-CM | POA: Diagnosis present

## 2013-01-19 DIAGNOSIS — R4182 Altered mental status, unspecified: Secondary | ICD-10-CM

## 2013-01-19 DIAGNOSIS — Z6832 Body mass index (BMI) 32.0-32.9, adult: Secondary | ICD-10-CM

## 2013-01-19 DIAGNOSIS — J449 Chronic obstructive pulmonary disease, unspecified: Secondary | ICD-10-CM | POA: Diagnosis present

## 2013-01-19 DIAGNOSIS — F1020 Alcohol dependence, uncomplicated: Secondary | ICD-10-CM | POA: Diagnosis present

## 2013-01-19 DIAGNOSIS — F1210 Cannabis abuse, uncomplicated: Secondary | ICD-10-CM | POA: Diagnosis present

## 2013-01-19 DIAGNOSIS — I10 Essential (primary) hypertension: Secondary | ICD-10-CM | POA: Diagnosis present

## 2013-01-19 DIAGNOSIS — S2249XA Multiple fractures of ribs, unspecified side, initial encounter for closed fracture: Principal | ICD-10-CM | POA: Diagnosis present

## 2013-01-19 DIAGNOSIS — K226 Gastro-esophageal laceration-hemorrhage syndrome: Secondary | ICD-10-CM | POA: Diagnosis present

## 2013-01-19 DIAGNOSIS — F1010 Alcohol abuse, uncomplicated: Secondary | ICD-10-CM

## 2013-01-19 DIAGNOSIS — IMO0002 Reserved for concepts with insufficient information to code with codable children: Secondary | ICD-10-CM | POA: Insufficient documentation

## 2013-01-19 DIAGNOSIS — K2100 Gastro-esophageal reflux disease with esophagitis, without bleeding: Secondary | ICD-10-CM | POA: Diagnosis present

## 2013-01-19 DIAGNOSIS — K2980 Duodenitis without bleeding: Secondary | ICD-10-CM | POA: Diagnosis present

## 2013-01-19 DIAGNOSIS — Z8639 Personal history of other endocrine, nutritional and metabolic disease: Secondary | ICD-10-CM

## 2013-01-19 DIAGNOSIS — S0990XA Unspecified injury of head, initial encounter: Secondary | ICD-10-CM | POA: Insufficient documentation

## 2013-01-19 DIAGNOSIS — S298XXA Other specified injuries of thorax, initial encounter: Secondary | ICD-10-CM | POA: Insufficient documentation

## 2013-01-19 DIAGNOSIS — G4730 Sleep apnea, unspecified: Secondary | ICD-10-CM | POA: Diagnosis present

## 2013-01-19 DIAGNOSIS — S00411A Abrasion of right ear, initial encounter: Secondary | ICD-10-CM

## 2013-01-19 DIAGNOSIS — Z833 Family history of diabetes mellitus: Secondary | ICD-10-CM

## 2013-01-19 DIAGNOSIS — E871 Hypo-osmolality and hyponatremia: Secondary | ICD-10-CM

## 2013-01-19 DIAGNOSIS — E876 Hypokalemia: Secondary | ICD-10-CM | POA: Diagnosis present

## 2013-01-19 DIAGNOSIS — R000 Tachycardia, unspecified: Secondary | ICD-10-CM | POA: Diagnosis present

## 2013-01-19 DIAGNOSIS — E669 Obesity, unspecified: Secondary | ICD-10-CM | POA: Diagnosis present

## 2013-01-19 DIAGNOSIS — D62 Acute posthemorrhagic anemia: Secondary | ICD-10-CM | POA: Diagnosis present

## 2013-01-19 DIAGNOSIS — S2232XA Fracture of one rib, left side, initial encounter for closed fracture: Secondary | ICD-10-CM

## 2013-01-19 DIAGNOSIS — S0081XA Abrasion of other part of head, initial encounter: Secondary | ICD-10-CM

## 2013-01-19 LAB — CBC WITH DIFFERENTIAL/PLATELET
Basophils Absolute: 0.1 10*3/uL (ref 0.0–0.1)
Basophils Relative: 1 % (ref 0–1)
EOS ABS: 0.4 10*3/uL (ref 0.0–0.7)
Eosinophils Relative: 4 % (ref 0–5)
HCT: 45.9 % (ref 39.0–52.0)
HEMOGLOBIN: 16.5 g/dL (ref 13.0–17.0)
LYMPHS ABS: 4 10*3/uL (ref 0.7–4.0)
LYMPHS PCT: 39 % (ref 12–46)
MCH: 33.7 pg (ref 26.0–34.0)
MCHC: 35.9 g/dL (ref 30.0–36.0)
MCV: 93.7 fL (ref 78.0–100.0)
MONOS PCT: 8 % (ref 3–12)
Monocytes Absolute: 0.8 10*3/uL (ref 0.1–1.0)
NEUTROS PCT: 49 % (ref 43–77)
Neutro Abs: 5 10*3/uL (ref 1.7–7.7)
Platelets: 262 10*3/uL (ref 150–400)
RBC: 4.9 MIL/uL (ref 4.22–5.81)
RDW: 12.5 % (ref 11.5–15.5)
WBC: 10.2 10*3/uL (ref 4.0–10.5)

## 2013-01-19 LAB — COMPREHENSIVE METABOLIC PANEL
ALBUMIN: 3.6 g/dL (ref 3.5–5.2)
ALT: 24 U/L (ref 0–53)
AST: 26 U/L (ref 0–37)
Alkaline Phosphatase: 68 U/L (ref 39–117)
BILIRUBIN TOTAL: 0.4 mg/dL (ref 0.3–1.2)
BUN: 11 mg/dL (ref 6–23)
CHLORIDE: 88 meq/L — AB (ref 96–112)
CO2: 23 mEq/L (ref 19–32)
Calcium: 9 mg/dL (ref 8.4–10.5)
Creatinine, Ser: 1.35 mg/dL (ref 0.50–1.35)
GFR calc Af Amer: 69 mL/min — ABNORMAL LOW (ref >90)
GFR calc non Af Amer: 59 mL/min — ABNORMAL LOW (ref >90)
Glucose, Bld: 109 mg/dL — ABNORMAL HIGH (ref 70–99)
POTASSIUM: 3.9 meq/L (ref 3.7–5.3)
Sodium: 126 mEq/L — ABNORMAL LOW (ref 137–147)
TOTAL PROTEIN: 6.7 g/dL (ref 6.0–8.3)

## 2013-01-19 LAB — ETHANOL: ALCOHOL ETHYL (B): 284 mg/dL — AB (ref 0–11)

## 2013-01-19 LAB — PROTIME-INR
INR: 0.97 (ref 0.00–1.49)
Prothrombin Time: 12.7 seconds (ref 11.6–15.2)

## 2013-01-19 LAB — POCT I-STAT TROPONIN I: Troponin i, poc: 0 ng/mL (ref 0.00–0.08)

## 2013-01-19 LAB — LIPASE, BLOOD: Lipase: 58 U/L (ref 11–59)

## 2013-01-19 MED ORDER — FENTANYL CITRATE 0.05 MG/ML IJ SOLN
50.0000 ug | Freq: Once | INTRAMUSCULAR | Status: AC
  Administered 2013-01-19: 50 ug via INTRAVENOUS
  Filled 2013-01-19: qty 2

## 2013-01-19 MED ORDER — IBUPROFEN 200 MG PO TABS: 400.0000 mg | ORAL_TABLET | Freq: Every day | ORAL | Status: DC | PRN

## 2013-01-19 MED ORDER — CALCIUM CARBONATE ANTACID 500 MG PO CHEW
1.0000 | CHEWABLE_TABLET | Freq: Once | ORAL | Status: AC
  Administered 2013-01-19: 200 mg via ORAL
  Filled 2013-01-19: qty 1

## 2013-01-19 MED ORDER — KETOROLAC TROMETHAMINE 60 MG/2ML IM SOLN
60.0000 mg | Freq: Once | INTRAMUSCULAR | Status: AC
  Administered 2013-01-19: 60 mg via INTRAMUSCULAR
  Filled 2013-01-19: qty 2

## 2013-01-19 MED ORDER — ONDANSETRON HCL 4 MG/2ML IJ SOLN
4.0000 mg | Freq: Once | INTRAMUSCULAR | Status: AC
  Administered 2013-01-19: 4 mg via INTRAVENOUS
  Filled 2013-01-19: qty 2

## 2013-01-19 MED ORDER — DICYCLOMINE HCL 10 MG/ML IM SOLN
20.0000 mg | Freq: Once | INTRAMUSCULAR | Status: AC
  Administered 2013-01-19: 20 mg via INTRAMUSCULAR
  Filled 2013-01-19: qty 2

## 2013-01-19 MED ORDER — HYDROCODONE-ACETAMINOPHEN 5-325 MG PO TABS: 1.0000 | ORAL_TABLET | ORAL | Status: DC | PRN

## 2013-01-19 MED ORDER — IOHEXOL 300 MG/ML  SOLN
100.0000 mL | Freq: Once | INTRAMUSCULAR | Status: AC | PRN
  Administered 2013-01-19: 100 mL via INTRAVENOUS

## 2013-01-19 MED ORDER — DIPHENHYDRAMINE HCL 50 MG/ML IJ SOLN
12.5000 mg | Freq: Once | INTRAMUSCULAR | Status: AC
  Administered 2013-01-19: 12.5 mg via INTRAVENOUS
  Filled 2013-01-19: qty 1

## 2013-01-19 MED ORDER — ALBUTEROL SULFATE (2.5 MG/3ML) 0.083% IN NEBU
2.5000 mg | INHALATION_SOLUTION | Freq: Once | RESPIRATORY_TRACT | Status: AC
  Administered 2013-01-19: 2.5 mg via RESPIRATORY_TRACT
  Filled 2013-01-19: qty 3

## 2013-01-19 MED ORDER — MORPHINE SULFATE 4 MG/ML IJ SOLN
4.0000 mg | Freq: Once | INTRAMUSCULAR | Status: AC
  Administered 2013-01-19: 4 mg via INTRAVENOUS
  Filled 2013-01-19: qty 1

## 2013-01-19 MED ORDER — METOCLOPRAMIDE HCL 5 MG/ML IJ SOLN
10.0000 mg | Freq: Once | INTRAMUSCULAR | Status: AC
  Administered 2013-01-19: 10 mg via INTRAVENOUS
  Filled 2013-01-19: qty 2

## 2013-01-19 NOTE — ED Notes (Addendum)
Pt verbalized understanding of discharge instructions.  Pt is concerned about chest pain.  Pt educated that a chest CT, chest xray, EKG, and cardiac labs were performed and resulted negative.    Pt belongings will be sent w/ [employer].

## 2013-01-19 NOTE — ED Notes (Signed)
Pt is continuing to refuse to eat stating "if I try to eat anything, then I'm going to throw it up.  Please let the Tums kick in."  Pt was able to drink several sips of orange juice.

## 2013-01-19 NOTE — ED Notes (Signed)
Pt reports that he is sober and he urinated in the floor, due to having a weak bladder.
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
## 2013-01-19 NOTE — ED Notes (Signed)
Pt has been restless and yelling in pain d/t to pain.
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
## 2013-01-19 NOTE — ED Notes (Signed)
Patient transported to X-ray 
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
## 2013-01-19 NOTE — ED Notes (Signed)
Pt reports of chest pain the left lower rib area.  Dr. Sharol Given made aware.
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
## 2013-01-19 NOTE — ED Notes (Signed)
Called pt's daughter, Tillie Rung, she said her sister is already on her way here with his car to pick him up.
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
## 2013-01-19 NOTE — ED Notes (Signed)
Patient transported to CT 
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
## 2013-01-19 NOTE — Discharge Instructions (Signed)
Use incentive spirometer as directed. Take Vicodin for severe pain only. No driving or operating heavy machinery while taking vicodin. This medication may cause drowsiness.  Rib Fracture A rib fracture is a break or crack in one of the bones of the ribs. The ribs are a group of long, curved bones that wrap around your chest and attach to your spine. They protect your lungs and other organs in the chest cavity. A broken or cracked rib is often painful, but most do not cause other problems. Most rib fractures heal on their own over time. However, rib fractures can be more serious if multiple ribs are broken or if broken ribs move out of place and push against other structures. CAUSES   A direct blow to the chest. For example, this could happen during contact sports, a car accident, or a fall against a hard object.  Repetitive movements with high force, such as pitching a baseball or having severe coughing spells. SYMPTOMS   Pain when you breathe in or cough.  Pain when someone presses on the injured area. DIAGNOSIS  Your caregiver will perform a physical exam. Various imaging tests may be ordered to confirm the diagnosis and to look for related injuries. These tests may include a chest X-ray, computed tomography (CT), magnetic resonance imaging (MRI), or a bone scan. TREATMENT  Rib fractures usually heal on their own in 1 3 months. The longer healing period is often associated with a continued cough or other aggravating activities. During the healing period, pain control is very important. Medication is usually given to control pain. Hospitalization or surgery may be needed for more severe injuries, such as those in which multiple ribs are broken or the ribs have moved out of place.  HOME CARE INSTRUCTIONS   Avoid strenuous activity and any activities or movements that cause pain. Be careful during activities and avoid bumping the injured rib.  Gradually increase activity as directed by your  caregiver.  Only take over-the-counter or prescription medications as directed by your caregiver. Do not take other medications without asking your caregiver first.  Apply ice to the injured area for the first 1 2 days after you have been treated or as directed by your caregiver. Applying ice helps to reduce inflammation and pain.  Put ice in a plastic bag.  Place a towel between your skin and the bag.   Leave the ice on for 15 20 minutes at a time, every 2 hours while you are awake.  Perform deep breathing as directed by your caregiver. This will help prevent pneumonia, which is a common complication of a broken rib. Your caregiver may instruct you to:  Take deep breaths several times a day.  Try to cough several times a day, holding a pillow against the injured area.  Use a device called an incentive spirometer to practice deep breathing several times a day.  Drink enough fluids to keep your urine clear or pale yellow. This will help you avoid constipation.   Do not wear a rib belt or binder. These restrict breathing, which can lead to pneumonia.  SEEK IMMEDIATE MEDICAL CARE IF:   You have a fever.   You have difficulty breathing or shortness of breath.   You develop a continual cough, or you cough up thick or bloody sputum.  You feel sick to your stomach (nausea), throw up (vomit), or have abdominal pain.   You have worsening pain not controlled with medications.  MAKE SURE YOU:  Understand these  instructions.  Will watch your condition.  Will get help right away if you are not doing well or get worse. Document Released: 12/22/2004 Document Revised: 08/24/2012 Document Reviewed: 02/24/2012 [facility] Patient Information 2014 [place], [place].
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
## 2013-01-19 NOTE — ED Notes (Signed)
This RN made [employer] officer aware that discharge paperwork is with transfer paperwork and needs to be returned to Pt.

## 2013-01-19 NOTE — ED Provider Notes (Signed)
Medical screening examination/treatment/procedure(s) were performed by non-physician practitioner and as supervising physician I was immediately available for consultation/collaboration.  EKG Interpretation   None         Neta Ehlers, MD 01/19/13 1606

## 2013-01-19 NOTE — ED Notes (Signed)
Pt is continually screaming "help me!"  When need is assessed, Pt asks for an antacid.  MD notified and medication ordered.  Waiting on medication from pharmacy.
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
## 2013-01-20 ENCOUNTER — Encounter (HOSPITAL_COMMUNITY): Payer: Self-pay | Admitting: Emergency Medicine

## 2013-01-20 ENCOUNTER — Emergency Department (HOSPITAL_COMMUNITY): Payer: Medicaid Other

## 2013-01-20 ENCOUNTER — Inpatient Hospital Stay (HOSPITAL_COMMUNITY)
Admission: EM | Admit: 2013-01-20 | Discharge: 2013-01-26 | DRG: 183 | Disposition: A | Discharge status: Home / Self Care | Payer: Medicaid Other | Attending: General Surgery | Admitting: General Surgery

## 2013-01-20 DIAGNOSIS — F1010 Alcohol abuse, uncomplicated: Secondary | ICD-10-CM

## 2013-01-20 DIAGNOSIS — K2100 Gastro-esophageal reflux disease with esophagitis, without bleeding: Secondary | ICD-10-CM

## 2013-01-20 DIAGNOSIS — K2270 Barrett's esophagus without dysplasia: Secondary | ICD-10-CM

## 2013-01-20 DIAGNOSIS — K922 Gastrointestinal hemorrhage, unspecified: Secondary | ICD-10-CM

## 2013-01-20 DIAGNOSIS — K219 Gastro-esophageal reflux disease without esophagitis: Secondary | ICD-10-CM

## 2013-01-20 DIAGNOSIS — S2239XA Fracture of one rib, unspecified side, initial encounter for closed fracture: Secondary | ICD-10-CM

## 2013-01-20 DIAGNOSIS — S2242XA Multiple fractures of ribs, left side, initial encounter for closed fracture: Secondary | ICD-10-CM | POA: Diagnosis present

## 2013-01-20 DIAGNOSIS — S32009A Unspecified fracture of unspecified lumbar vertebra, initial encounter for closed fracture: Secondary | ICD-10-CM | POA: Diagnosis present

## 2013-01-20 DIAGNOSIS — D62 Acute posthemorrhagic anemia: Secondary | ICD-10-CM | POA: Diagnosis not present

## 2013-01-20 LAB — BASIC METABOLIC PANEL
BUN: 41 mg/dL — ABNORMAL HIGH (ref 6–23)
CHLORIDE: 101 meq/L (ref 96–112)
CO2: 19 meq/L (ref 19–32)
Calcium: 9.2 mg/dL (ref 8.4–10.5)
Creatinine, Ser: 0.9 mg/dL (ref 0.50–1.35)
GFR calc non Af Amer: >90 mL/min (ref >90)
Glucose, Bld: 110 mg/dL — ABNORMAL HIGH (ref 70–99)
POTASSIUM: 4.3 meq/L (ref 3.7–5.3)
Sodium: 134 mEq/L — ABNORMAL LOW (ref 137–147)

## 2013-01-20 LAB — CBC
HEMATOCRIT: 42.3 % (ref 39.0–52.0)
HEMOGLOBIN: 15.1 g/dL (ref 13.0–17.0)
MCH: 34 pg (ref 26.0–34.0)
MCHC: 35.7 g/dL (ref 30.0–36.0)
MCV: 95.3 fL (ref 78.0–100.0)
Platelets: 225 10*3/uL (ref 150–400)
RBC: 4.44 MIL/uL (ref 4.22–5.81)
RDW: 12.6 % (ref 11.5–15.5)
WBC: 15.3 10*3/uL — AB (ref 4.0–10.5)

## 2013-01-20 LAB — POCT I-STAT TROPONIN I: Troponin i, poc: 0 ng/mL (ref 0.00–0.08)

## 2013-01-20 MED ORDER — SODIUM CHLORIDE 0.9 % IV BOLUS (SEPSIS)
1000.0000 mL | Freq: Once | INTRAVENOUS | Status: AC
  Administered 2013-01-20: 1000 mL via INTRAVENOUS

## 2013-01-20 MED ORDER — HYDROMORPHONE HCL PF 1 MG/ML IJ SOLN
1.0000 mg | Freq: Once | INTRAMUSCULAR | Status: AC
  Administered 2013-01-20: 1 mg via INTRAVENOUS
  Filled 2013-01-20: qty 1

## 2013-01-20 MED ORDER — HYDROMORPHONE HCL PF 1 MG/ML IJ SOLN
1.0000 mg | Freq: Once | INTRAMUSCULAR | Status: AC
  Administered 2013-01-20: 1 mg via INTRAVENOUS

## 2013-01-20 MED ORDER — HYDROMORPHONE HCL PF 1 MG/ML IJ SOLN
INTRAMUSCULAR | Status: AC
  Administered 2013-01-20: 1 mg via INTRAVENOUS
  Filled 2013-01-20: qty 1

## 2013-01-20 NOTE — ED Notes (Signed)
Jesse Hartman- daughter [phone] Call with room number please.
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
## 2013-01-21 ENCOUNTER — Encounter (HOSPITAL_COMMUNITY): Payer: Self-pay | Admitting: *Deleted

## 2013-01-21 ENCOUNTER — Inpatient Hospital Stay (HOSPITAL_COMMUNITY): Payer: Medicaid Other

## 2013-01-21 DIAGNOSIS — S22009A Unspecified fracture of unspecified thoracic vertebra, initial encounter for closed fracture: Secondary | ICD-10-CM

## 2013-01-21 DIAGNOSIS — S2249XA Multiple fractures of ribs, unspecified side, initial encounter for closed fracture: Secondary | ICD-10-CM

## 2013-01-21 DIAGNOSIS — D62 Acute posthemorrhagic anemia: Secondary | ICD-10-CM

## 2013-01-21 DIAGNOSIS — S32009A Unspecified fracture of unspecified lumbar vertebra, initial encounter for closed fracture: Secondary | ICD-10-CM | POA: Diagnosis present

## 2013-01-21 DIAGNOSIS — IMO0002 Reserved for concepts with insufficient information to code with codable children: Secondary | ICD-10-CM

## 2013-01-21 DIAGNOSIS — F1010 Alcohol abuse, uncomplicated: Secondary | ICD-10-CM

## 2013-01-21 LAB — CBC
HEMATOCRIT: 37 % — AB (ref 39.0–52.0)
Hemoglobin: 12.8 g/dL — ABNORMAL LOW (ref 13.0–17.0)
MCH: 34 pg (ref 26.0–34.0)
MCHC: 34.6 g/dL (ref 30.0–36.0)
MCV: 98.4 fL (ref 78.0–100.0)
Platelets: 177 10*3/uL (ref 150–400)
RBC: 3.76 MIL/uL — AB (ref 4.22–5.81)
RDW: 13.1 % (ref 11.5–15.5)
WBC: 8.7 10*3/uL (ref 4.0–10.5)

## 2013-01-21 LAB — BASIC METABOLIC PANEL
BUN: 32 mg/dL — ABNORMAL HIGH (ref 6–23)
CHLORIDE: 102 meq/L (ref 96–112)
CO2: 23 meq/L (ref 19–32)
CREATININE: 0.75 mg/dL (ref 0.50–1.35)
Calcium: 8.8 mg/dL (ref 8.4–10.5)
GFR calc Af Amer: >90 mL/min (ref >90)
GFR calc non Af Amer: >90 mL/min (ref >90)
Glucose, Bld: 140 mg/dL — ABNORMAL HIGH (ref 70–99)
POTASSIUM: 4.4 meq/L (ref 3.7–5.3)
Sodium: 136 mEq/L — ABNORMAL LOW (ref 137–147)

## 2013-01-21 LAB — MRSA PCR SCREENING: MRSA by PCR: POSITIVE — AB

## 2013-01-21 MED ORDER — HYDROMORPHONE HCL PF 1 MG/ML IJ SOLN
INTRAMUSCULAR | Status: AC
  Administered 2013-01-21: 1 mg via INTRAVENOUS
  Filled 2013-01-21: qty 2

## 2013-01-21 MED ORDER — ENOXAPARIN SODIUM 30 MG/0.3ML ~~LOC~~ SOLN
30.0000 mg | Freq: Two times a day (BID) | SUBCUTANEOUS | Status: DC
  Administered 2013-01-21 – 2013-01-22 (×3): 30 mg via SUBCUTANEOUS
  Filled 2013-01-21 (×4): qty 0.3

## 2013-01-21 MED ORDER — LORAZEPAM 1 MG PO TABS
2.0000 mg | ORAL_TABLET | ORAL | Status: DC | PRN
  Administered 2013-01-21 – 2013-01-25 (×4): 2 mg via ORAL
  Filled 2013-01-21 (×5): qty 2

## 2013-01-21 MED ORDER — POLYETHYLENE GLYCOL 3350 17 G PO PACK
17.0000 g | PACK | Freq: Every day | ORAL | Status: DC
  Administered 2013-01-21 – 2013-01-26 (×4): 17 g via ORAL
  Filled 2013-01-21 (×6): qty 1

## 2013-01-21 MED ORDER — TRAMADOL HCL 50 MG PO TABS
50.0000 mg | ORAL_TABLET | Freq: Four times a day (QID) | ORAL | Status: DC
  Administered 2013-01-21 (×2): 50 mg via ORAL
  Filled 2013-01-21 (×2): qty 1

## 2013-01-21 MED ORDER — OXYCODONE HCL 5 MG PO TABS
5.0000 mg | ORAL_TABLET | ORAL | Status: DC | PRN
  Administered 2013-01-21 – 2013-01-22 (×5): 15 mg via ORAL
  Administered 2013-01-23 (×3): 10 mg via ORAL
  Administered 2013-01-24 – 2013-01-25 (×5): 15 mg via ORAL
  Administered 2013-01-26 (×3): 10 mg via ORAL
  Filled 2013-01-21 (×2): qty 3
  Filled 2013-01-21: qty 2
  Filled 2013-01-21 (×3): qty 3
  Filled 2013-01-21: qty 2
  Filled 2013-01-21 (×3): qty 3
  Filled 2013-01-21: qty 2
  Filled 2013-01-21 (×2): qty 3
  Filled 2013-01-21 (×3): qty 2

## 2013-01-21 MED ORDER — DOCUSATE SODIUM 100 MG PO CAPS
100.0000 mg | ORAL_CAPSULE | Freq: Two times a day (BID) | ORAL | Status: DC
  Administered 2013-01-21 – 2013-01-26 (×10): 100 mg via ORAL
  Filled 2013-01-21 (×10): qty 1

## 2013-01-21 MED ORDER — ALBUTEROL SULFATE (2.5 MG/3ML) 0.083% IN NEBU: 2.5000 mg | INHALATION_SOLUTION | Freq: Four times a day (QID) | RESPIRATORY_TRACT | Status: DC | PRN

## 2013-01-21 MED ORDER — CHLORHEXIDINE GLUCONATE CLOTH 2 % EX PADS
6.0000 | MEDICATED_PAD | Freq: Every day | CUTANEOUS | Status: DC
  Administered 2013-01-22 – 2013-01-26 (×3): 6 via TOPICAL

## 2013-01-21 MED ORDER — TIZANIDINE HCL 4 MG PO TABS
4.0000 mg | ORAL_TABLET | Freq: Three times a day (TID) | ORAL | Status: DC | PRN
  Administered 2013-01-21: 4 mg via ORAL
  Filled 2013-01-21: qty 1

## 2013-01-21 MED ORDER — HYDROMORPHONE HCL PF 1 MG/ML IJ SOLN
1.0000 mg | INTRAMUSCULAR | Status: DC | PRN
  Administered 2013-01-21 (×3): 1 mg via INTRAVENOUS
  Filled 2013-01-21: qty 1

## 2013-01-21 MED ORDER — HYDROCODONE-ACETAMINOPHEN 5-325 MG PO TABS
1.0000 | ORAL_TABLET | ORAL | Status: DC | PRN
  Administered 2013-01-21: 2 via ORAL
  Filled 2013-01-21: qty 2

## 2013-01-21 MED ORDER — ENOXAPARIN SODIUM 40 MG/0.4ML ~~LOC~~ SOLN
40.0000 mg | SUBCUTANEOUS | Status: DC
  Filled 2013-01-21: qty 0.4

## 2013-01-21 MED ORDER — CLONAZEPAM 1 MG PO TABS
2.0000 mg | ORAL_TABLET | Freq: Two times a day (BID) | ORAL | Status: DC | PRN
  Administered 2013-01-22 – 2013-01-26 (×6): 2 mg via ORAL
  Filled 2013-01-21 (×6): qty 2

## 2013-01-21 MED ORDER — AMLODIPINE BESYLATE 10 MG PO TABS
10.0000 mg | ORAL_TABLET | Freq: Every day | ORAL | Status: DC
  Administered 2013-01-21 – 2013-01-26 (×4): 10 mg via ORAL
  Filled 2013-01-21 (×6): qty 1

## 2013-01-21 MED ORDER — SODIUM CHLORIDE 0.9 % IV BOLUS (SEPSIS)
1000.0000 mL | Freq: Once | INTRAVENOUS | Status: AC
  Administered 2013-01-21: 1000 mL via INTRAVENOUS

## 2013-01-21 MED ORDER — NAPROXEN 500 MG PO TABS
500.0000 mg | ORAL_TABLET | Freq: Two times a day (BID) | ORAL | Status: DC
  Administered 2013-01-21 – 2013-01-22 (×2): 500 mg via ORAL
  Filled 2013-01-21 (×5): qty 1

## 2013-01-21 MED ORDER — BUDESONIDE-FORMOTEROL FUMARATE 160-4.5 MCG/ACT IN AERO
2.0000 | INHALATION_SPRAY | Freq: Two times a day (BID) | RESPIRATORY_TRACT | Status: DC
  Administered 2013-01-21 – 2013-01-26 (×11): 2 via RESPIRATORY_TRACT
  Filled 2013-01-21: qty 6

## 2013-01-21 MED ORDER — MUPIROCIN 2 % EX OINT
1.0000 "application " | TOPICAL_OINTMENT | Freq: Two times a day (BID) | CUTANEOUS | Status: DC
  Administered 2013-01-22 – 2013-01-26 (×9): 1 via NASAL
  Filled 2013-01-21: qty 22

## 2013-01-21 MED ORDER — HYDROMORPHONE HCL PF 1 MG/ML IJ SOLN: 0.5000 mg | INTRAMUSCULAR | Status: DC | PRN

## 2013-01-21 MED ORDER — LORAZEPAM 2 MG/ML IJ SOLN: 2.0000 mg | INTRAMUSCULAR | Status: DC | PRN

## 2013-01-21 MED ORDER — AMLODIPINE-OLMESARTAN 10-40 MG PO TABS: 1.0000 | ORAL_TABLET | Freq: Every day | ORAL | Status: DC

## 2013-01-21 MED ORDER — PANTOPRAZOLE SODIUM 40 MG PO TBEC
40.0000 mg | DELAYED_RELEASE_TABLET | Freq: Every day | ORAL | Status: DC
  Administered 2013-01-21: 40 mg via ORAL
  Filled 2013-01-21: qty 1

## 2013-01-21 MED ORDER — ALBUTEROL SULFATE (2.5 MG/3ML) 0.083% IN NEBU
2.5000 mg | INHALATION_SOLUTION | Freq: Four times a day (QID) | RESPIRATORY_TRACT | Status: DC | PRN
  Administered 2013-01-22: 2.5 mg via RESPIRATORY_TRACT
  Filled 2013-01-21: qty 3

## 2013-01-21 MED ORDER — TRAMADOL HCL 50 MG PO TABS
100.0000 mg | ORAL_TABLET | Freq: Four times a day (QID) | ORAL | Status: DC
  Administered 2013-01-21 – 2013-01-26 (×21): 100 mg via ORAL
  Filled 2013-01-21 (×21): qty 2

## 2013-01-21 MED ORDER — KCL IN DEXTROSE-NACL 20-5-0.45 MEQ/L-%-% IV SOLN
INTRAVENOUS | Status: DC
  Administered 2013-01-21: 02:00:00 via INTRAVENOUS
  Filled 2013-01-21 (×2): qty 1000

## 2013-01-21 MED ORDER — PANTOPRAZOLE SODIUM 40 MG IV SOLR
40.0000 mg | Freq: Every day | INTRAVENOUS | Status: DC
  Filled 2013-01-21: qty 40

## 2013-01-21 MED ORDER — LORAZEPAM 2 MG/ML IJ SOLN
2.0000 mg | INTRAMUSCULAR | Status: DC | PRN
  Administered 2013-01-22 (×2): 2 mg via INTRAVENOUS
  Filled 2013-01-21 (×2): qty 1

## 2013-01-21 MED ORDER — IPRATROPIUM-ALBUTEROL 0.5-2.5 (3) MG/3ML IN SOLN
3.0000 mL | Freq: Four times a day (QID) | RESPIRATORY_TRACT | Status: DC
  Administered 2013-01-21 (×4): 3 mL via RESPIRATORY_TRACT
  Filled 2013-01-21 (×4): qty 3

## 2013-01-21 MED ORDER — ONDANSETRON HCL 4 MG PO TABS: 4.0000 mg | ORAL_TABLET | Freq: Four times a day (QID) | ORAL | Status: DC | PRN

## 2013-01-21 MED ORDER — IRBESARTAN 300 MG PO TABS
300.0000 mg | ORAL_TABLET | Freq: Every day | ORAL | Status: DC
  Administered 2013-01-21 – 2013-01-26 (×4): 300 mg via ORAL
  Filled 2013-01-21 (×6): qty 1

## 2013-01-21 MED ORDER — ALBUTEROL SULFATE HFA 108 (90 BASE) MCG/ACT IN AERS: 2.0000 | INHALATION_SPRAY | Freq: Four times a day (QID) | RESPIRATORY_TRACT | Status: DC | PRN

## 2013-01-21 MED ORDER — OXYCODONE HCL 5 MG PO TABS: 5.0000 mg | ORAL_TABLET | ORAL | Status: DC | PRN

## 2013-01-21 MED ORDER — TIOTROPIUM BROMIDE MONOHYDRATE 18 MCG IN CAPS
18.0000 ug | ORAL_CAPSULE | Freq: Every day | RESPIRATORY_TRACT | Status: DC
  Administered 2013-01-21 – 2013-01-25 (×5): 18 ug via RESPIRATORY_TRACT
  Filled 2013-01-21 (×2): qty 5

## 2013-01-21 MED ORDER — BUDESONIDE-FORMOTEROL FUMARATE 160-4.5 MCG/ACT IN AERO: 2.0000 | INHALATION_SPRAY | Freq: Two times a day (BID) | RESPIRATORY_TRACT | Status: DC

## 2013-01-21 MED ORDER — FOLIC ACID 1 MG PO TABS
1.0000 mg | ORAL_TABLET | Freq: Every day | ORAL | Status: DC
  Administered 2013-01-21 – 2013-01-26 (×5): 1 mg via ORAL
  Filled 2013-01-21 (×6): qty 1

## 2013-01-21 MED ORDER — METOPROLOL SUCCINATE ER 50 MG PO TB24
50.0000 mg | ORAL_TABLET | Freq: Every day | ORAL | Status: DC
  Administered 2013-01-26: 50 mg via ORAL
  Filled 2013-01-21 (×7): qty 1

## 2013-01-21 MED ORDER — HYDROMORPHONE HCL PF 1 MG/ML IJ SOLN
0.5000 mg | INTRAMUSCULAR | Status: DC | PRN
  Administered 2013-01-22 – 2013-01-25 (×3): 0.5 mg via INTRAVENOUS
  Filled 2013-01-21 (×3): qty 1

## 2013-01-21 MED ORDER — ONDANSETRON HCL 4 MG/2ML IJ SOLN
4.0000 mg | Freq: Four times a day (QID) | INTRAMUSCULAR | Status: DC | PRN
  Administered 2013-01-22 – 2013-01-25 (×3): 4 mg via INTRAVENOUS
  Filled 2013-01-21 (×4): qty 2

## 2013-01-21 MED ORDER — VITAMIN B-1 100 MG PO TABS
100.0000 mg | ORAL_TABLET | Freq: Every day | ORAL | Status: DC
  Administered 2013-01-21 – 2013-01-26 (×5): 100 mg via ORAL
  Filled 2013-01-21 (×6): qty 1

## 2013-01-21 NOTE — Progress Notes (Signed)
Silvestre Gunner PA-C, notified that pt's SBPs are still in the 40s. Plan of care -- give 1L NS bolus. Armandina Gemma, Kirby Crigler

## 2013-01-21 NOTE — Progress Notes (Signed)
Notified Silvestre Gunner PA-C, that pt's BP is 101/59, CIWA is 11 and pt is positive for MRSA in the nares. Plan of care -- may administer Ativan either IV or PO and standing orders for positive MRSA screen have been entered. Armandina Gemma, Kirby Crigler

## 2013-01-21 NOTE — Progress Notes (Signed)
Silvestre Gunner PA-C, notified that pt's blood pressure is 89/67. Pt was having slight dizziness but that has resolved since we laid the head of his bed back.  Plan of care -- hold antihypertensives and IV pain meds if SBP <100. Armandina Gemma, Kirby Crigler

## 2013-01-21 NOTE — Progress Notes (Signed)
PT Cancellation Note  Patient Details Name: Jesse Hartman MRN: [id number] DOB: 09/12/1960   Cancelled Treatment:    Reason Eval/Treat Not Completed: Pain limiting ability to participate;Medical issues which prohibited therapy. Pt with decreased BP today and experiencing dizziness. Will re-attempt to see pt when medically appropriate.    Elie Confer [place] , [place]  01/21/2013, 4:34 PM

## 2013-01-21 NOTE — Progress Notes (Signed)
C/o L lateral chest wall pain  Alert, nad Nontoxic cta but decreased bs Soft  Agree with modified pain regimen pulm toliet!!! Monitor BP. Hold home BP meds  Leighton Ruff. Redmond Pulling, MD, FACS General, Bariatric, & Minimally Invasive Surgery [facility], [place]

## 2013-01-21 NOTE — Progress Notes (Signed)
Silvestre Gunner PA-C, notified that pt's BP is 90/50. Pt still has slight dizziness when sitting up and still has not voided today. Plan of care -- give 1L bolus NS. Armandina Gemma, Kirby Crigler
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
## 2013-01-22 LAB — CBC WITH DIFFERENTIAL/PLATELET
Basophils Absolute: 0 10*3/uL (ref 0.0–0.1)
Basophils Relative: 0 % (ref 0–1)
Eosinophils Absolute: 0.2 10*3/uL (ref 0.0–0.7)
Eosinophils Relative: 2 % (ref 0–5)
HCT: 25.6 % — ABNORMAL LOW (ref 39.0–52.0)
Hemoglobin: 8.8 g/dL — ABNORMAL LOW (ref 13.0–17.0)
Lymphocytes Relative: 15 % (ref 12–46)
Lymphs Abs: 1.3 10*3/uL (ref 0.7–4.0)
MCH: 33.5 pg (ref 26.0–34.0)
MCHC: 34.4 g/dL (ref 30.0–36.0)
MCV: 97.3 fL (ref 78.0–100.0)
Monocytes Absolute: 0.7 10*3/uL (ref 0.1–1.0)
Monocytes Relative: 8 % (ref 3–12)
Neutro Abs: 6.3 10*3/uL (ref 1.7–7.7)
Neutrophils Relative %: 75 % (ref 43–77)
Platelets: 193 10*3/uL (ref 150–400)
RBC: 2.63 MIL/uL — ABNORMAL LOW (ref 4.22–5.81)
RDW: 12.8 % (ref 11.5–15.5)
WBC: 8.4 10*3/uL (ref 4.0–10.5)

## 2013-01-22 MED ORDER — SODIUM CHLORIDE 0.9 % IV SOLN
INTRAVENOUS | Status: DC
  Administered 2013-01-22 – 2013-01-24 (×4): via INTRAVENOUS

## 2013-01-22 MED ORDER — PANTOPRAZOLE SODIUM 40 MG IV SOLR
40.0000 mg | INTRAVENOUS | Status: DC
  Administered 2013-01-22: 40 mg via INTRAVENOUS
  Filled 2013-01-22: qty 40

## 2013-01-22 NOTE — Progress Notes (Signed)
Pt vomited a large amt of dark blood.  VS taken, Dr. Grandville Silos called and notified.  Orders received for DC Naprosyn, CBC, Protonix, clear liquids.  Gave ativan, zofran, protonix and .5 dilaudid.

## 2013-01-22 NOTE — Progress Notes (Signed)
  Subjective: Complains of left sided rib pain  Objective: Vital signs in last 24 hours: Temp:  [97.6 F (36.4 C)-98.4 F (36.9 C)] 98 F (36.7 C) (01/18 0856) Pulse Rate:  [52-100] 95 (01/18 0856) Resp:  [10-23] 20 (01/18 0856) BP: (82-120)/(45-67) 113/66 mmHg (01/18 0856) SpO2:  [93 %-100 %] 95 % (01/18 0856) Last BM Date: 01/18/13  Intake/Output from previous day: 01/17 0701 - 01/18 0700 In: 3015 [P.O.:865; I.V.:150; IV Piggyback:2000] Out: 550 [Urine:550] Intake/Output this shift:    Resp: rhonchi bilaterally Cardio: regular rate and rhythm GI: soft, nontender  Lab Results:   Recent Labs  01/20/13 1623 01/21/13 0540  WBC 15.3* 8.7  HGB 15.1 12.8*  HCT 42.3 37.0*  PLT 225 177   BMET  Recent Labs  01/20/13 1623 01/21/13 0540  NA 134* 136*  K 4.3 4.4  CL 101 102  CO2 19 23  GLUCOSE 110* 140*  BUN 41* 32*  CREATININE 0.90 0.75  CALCIUM 9.2 8.8   PT/INR No results found for this basename: LABPROT, INR,  in the last 72 hours ABG No results found for this basename: PHART, PCO2, PO2, HCO3,  in the last 72 hours  Studies/Results: Dg Chest 2 View (if Patient Has Fever And/or Copd)  01/20/2013   CLINICAL DATA:  Chest pain.  EXAM: CHEST  2 VIEW  COMPARISON:  January 19, 2013.  FINDINGS: The heart size and mediastinal contours are within normal limits. No pneumothorax or pleural effusion is noted. Old left clavicular fracture is noted. No acute pulmonary disease is noted. Left basilar opacity noted on prior exam has resolved.  IMPRESSION: No active cardiopulmonary disease.   Electronically Signed   By: Sabino Dick M.D.   On: 01/20/2013 16:59   Dg Chest Port 1 View  01/21/2013   CLINICAL DATA:  Left rib pain  EXAM: PORTABLE CHEST - 1 VIEW  COMPARISON:  01/20/2013  FINDINGS: Heart size and vascular pattern normal. Lungs clear. No pneumothorax or effusion. No change from prior study.  IMPRESSION: No acute findings   Electronically Signed   By: Skipper Cliche M.D.    On: 01/21/2013 07:21    Anti-infectives: Anti-infectives   None      Assessment/Plan: s/p * No surgery found * Advance diet Pulmonary toilet Pain control Consult behavioral health tomorrow for possible help with alcohol abuse  LOS: 2 days    TOTH III,Ivy Meriwether S 01/22/2013
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
## 2013-01-22 NOTE — Progress Notes (Signed)
Called Dr. Grandville Silos with CBC results (8.8 from 12.8), orders received for IVF at 100 and CBC in am.

## 2013-01-23 DIAGNOSIS — K922 Gastrointestinal hemorrhage, unspecified: Secondary | ICD-10-CM | POA: Diagnosis not present

## 2013-01-23 DIAGNOSIS — D62 Acute posthemorrhagic anemia: Secondary | ICD-10-CM | POA: Diagnosis not present

## 2013-01-23 DIAGNOSIS — F1010 Alcohol abuse, uncomplicated: Secondary | ICD-10-CM

## 2013-01-23 DIAGNOSIS — K2270 Barrett's esophagus without dysplasia: Secondary | ICD-10-CM

## 2013-01-23 LAB — CBC
HCT: 19.3 % — ABNORMAL LOW (ref 39.0–52.0)
HCT: 21.2 % — ABNORMAL LOW (ref 39.0–52.0)
HEMATOCRIT: 19.2 % — AB (ref 39.0–52.0)
Hemoglobin: 6.7 g/dL — CL (ref 13.0–17.0)
Hemoglobin: 6.7 g/dL — CL (ref 13.0–17.0)
Hemoglobin: 7.5 g/dL — ABNORMAL LOW (ref 13.0–17.0)
MCH: 33.3 pg (ref 26.0–34.0)
MCH: 33.5 pg (ref 26.0–34.0)
MCH: 33.2 pg (ref 26.0–34.0)
MCHC: 34.9 g/dL (ref 30.0–36.0)
MCHC: 34.7 g/dL (ref 30.0–36.0)
MCHC: 35.4 g/dL (ref 30.0–36.0)
MCV: 95.5 fL (ref 78.0–100.0)
MCV: 96.5 fL (ref 78.0–100.0)
MCV: 93.8 fL (ref 78.0–100.0)
PLATELETS: 161 10*3/uL (ref 150–400)
PLATELETS: 137 10*3/uL — AB (ref 150–400)
Platelets: 167 10*3/uL (ref 150–400)
RBC: 2.01 MIL/uL — ABNORMAL LOW (ref 4.22–5.81)
RBC: 2 MIL/uL — ABNORMAL LOW (ref 4.22–5.81)
RBC: 2.26 MIL/uL — AB (ref 4.22–5.81)
RDW: 12.9 % (ref 11.5–15.5)
RDW: 13 % (ref 11.5–15.5)
RDW: 14.7 % (ref 11.5–15.5)
WBC: 5.5 10*3/uL (ref 4.0–10.5)
WBC: 5.5 10*3/uL (ref 4.0–10.5)
WBC: 5.9 10*3/uL (ref 4.0–10.5)

## 2013-01-23 LAB — PROTIME-INR
INR: 1.03 (ref 0.00–1.49)
PROTHROMBIN TIME: 13.3 s (ref 11.6–15.2)

## 2013-01-23 LAB — PREPARE RBC (CROSSMATCH)

## 2013-01-23 LAB — ABO/RH: ABO/RH(D): A POS

## 2013-01-23 MED ORDER — ALBUTEROL SULFATE (2.5 MG/3ML) 0.083% IN NEBU
2.5000 mg | INHALATION_SOLUTION | RESPIRATORY_TRACT | Status: DC
  Administered 2013-01-23 (×2): 2.5 mg via RESPIRATORY_TRACT
  Filled 2013-01-23 (×2): qty 3

## 2013-01-23 MED ORDER — PANTOPRAZOLE SODIUM 40 MG IV SOLR
40.0000 mg | Freq: Two times a day (BID) | INTRAVENOUS | Status: DC
  Administered 2013-01-23 – 2013-01-25 (×4): 40 mg via INTRAVENOUS
  Filled 2013-01-23 (×7): qty 40

## 2013-01-23 MED ORDER — LORAZEPAM 1 MG PO TABS
1.0000 mg | ORAL_TABLET | Freq: Two times a day (BID) | ORAL | Status: DC
  Administered 2013-01-23 – 2013-01-26 (×5): 1 mg via ORAL
  Filled 2013-01-23 (×4): qty 1

## 2013-01-23 MED ORDER — ALBUTEROL SULFATE (2.5 MG/3ML) 0.083% IN NEBU
2.5000 mg | INHALATION_SOLUTION | Freq: Four times a day (QID) | RESPIRATORY_TRACT | Status: DC
  Administered 2013-01-24 – 2013-01-26 (×10): 2.5 mg via RESPIRATORY_TRACT
  Filled 2013-01-23 (×11): qty 3

## 2013-01-23 NOTE — Progress Notes (Signed)
PT Cancellation Note  Patient Details Name: Jesse Hartman MRN: [id number] DOB: June 17, 1960   Cancelled Treatment:    Reason Eval/Treat Not Completed: Medical issues which prohibited therapy. Patient with 6.7 Hgb and to receive 2 units of blood. Patient is symptomatic with increased HR. Will follow up later this afternoon if times allows and patient has received blood.   Jacqualyn Posey 01/23/2013, 10:02 AM
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
## 2013-01-23 NOTE — Progress Notes (Signed)
UR completed.  Pt will need MATCH program for meds at d/c but is not yet ready for d/c. Will follow along and enroll pt in program when discharge is determined.  Sandi Mariscal, RN BSN [place] CCM Trauma/Neuro ICU Case Manager [phone]

## 2013-01-23 NOTE — Progress Notes (Signed)
Pt. Is unable to participate in OT session secondary is requiring blood transfusion.
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
## 2013-01-23 NOTE — Progress Notes (Signed)
PA notified of pt's temp of 100.3.  No new orders at this time.  Will continue to monitor.  Eliezer Bottom Eunice

## 2013-01-24 ENCOUNTER — Encounter (HOSPITAL_COMMUNITY): Payer: Self-pay

## 2013-01-24 ENCOUNTER — Encounter (HOSPITAL_COMMUNITY): Payer: Medicaid Other | Admitting: Anesthesiology

## 2013-01-24 ENCOUNTER — Inpatient Hospital Stay (HOSPITAL_COMMUNITY): Payer: Medicaid Other | Admitting: Anesthesiology

## 2013-01-24 ENCOUNTER — Encounter (HOSPITAL_COMMUNITY): Admission: EM | Disposition: A | Discharge status: Home / Self Care | Payer: Self-pay | Source: Home / Self Care

## 2013-01-24 DIAGNOSIS — K922 Gastrointestinal hemorrhage, unspecified: Secondary | ICD-10-CM

## 2013-01-24 DIAGNOSIS — K2100 Gastro-esophageal reflux disease with esophagitis, without bleeding: Secondary | ICD-10-CM

## 2013-01-24 DIAGNOSIS — K219 Gastro-esophageal reflux disease without esophagitis: Secondary | ICD-10-CM

## 2013-01-24 HISTORY — PX: ESOPHAGOGASTRODUODENOSCOPY: SHX5428

## 2013-01-24 LAB — CBC
HEMATOCRIT: 19.8 % — AB (ref 39.0–52.0)
Hemoglobin: 7 g/dL — ABNORMAL LOW (ref 13.0–17.0)
MCH: 33 pg (ref 26.0–34.0)
MCHC: 35.4 g/dL (ref 30.0–36.0)
MCV: 93.4 fL (ref 78.0–100.0)
Platelets: 152 10*3/uL (ref 150–400)
RBC: 2.12 MIL/uL — ABNORMAL LOW (ref 4.22–5.81)
RDW: 14.6 % (ref 11.5–15.5)
WBC: 5.1 10*3/uL (ref 4.0–10.5)

## 2013-01-24 LAB — PREPARE RBC (CROSSMATCH)

## 2013-01-24 SURGERY — EGD (ESOPHAGOGASTRODUODENOSCOPY)
Anesthesia: Monitor Anesthesia Care

## 2013-01-24 MED ORDER — SUCRALFATE 1 GM/10ML PO SUSP
1.0000 g | Freq: Four times a day (QID) | ORAL | Status: DC
  Administered 2013-01-24 – 2013-01-26 (×10): 1 g via ORAL
  Filled 2013-01-24 (×14): qty 10

## 2013-01-24 MED ORDER — LACTATED RINGERS IV SOLN
INTRAVENOUS | Status: DC
  Administered 2013-01-24: 10:00:00 via INTRAVENOUS

## 2013-01-24 MED ORDER — MIDAZOLAM HCL 5 MG/5ML IJ SOLN
INTRAMUSCULAR | Status: DC | PRN
  Administered 2013-01-24: 1 mg via INTRAVENOUS

## 2013-01-24 MED ORDER — LACTATED RINGERS IV SOLN
INTRAVENOUS | Status: DC | PRN
  Administered 2013-01-24: 10:00:00 via INTRAVENOUS

## 2013-01-24 MED ORDER — DIPHENHYDRAMINE HCL 50 MG/ML IJ SOLN: 25.0000 mg | Freq: Once | INTRAMUSCULAR | Status: DC

## 2013-01-24 MED ORDER — PROPOFOL INFUSION 10 MG/ML OPTIME
INTRAVENOUS | Status: DC | PRN
  Administered 2013-01-24: 100 ug/kg/min via INTRAVENOUS

## 2013-01-24 MED ORDER — DIPHENHYDRAMINE HCL 25 MG PO CAPS
25.0000 mg | ORAL_CAPSULE | Freq: Once | ORAL | Status: AC
  Administered 2013-01-24: 25 mg via ORAL
  Filled 2013-01-24: qty 1

## 2013-01-24 MED ORDER — BUTAMBEN-TETRACAINE-BENZOCAINE 2-2-14 % EX AERO
INHALATION_SPRAY | CUTANEOUS | Status: DC | PRN
  Administered 2013-01-24: 2 via TOPICAL
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
## 2013-01-24 NOTE — Anesthesia Postprocedure Evaluation (Signed)
  Anesthesia Post-op Note  Patient: Jesse Hartman  Procedure(s) Performed: Procedure(s): ESOPHAGOGASTRODUODENOSCOPY (EGD) (N/A)  Patient Location: Endoscopy Unit  Anesthesia Type:MAC  Level of Consciousness: awake, alert  and oriented  Airway and Oxygen Therapy: Patient Spontanous Breathing  Post-op Pain: none  Post-op Assessment: Post-op Vital signs reviewed  Post-op Vital Signs: Reviewed and stable  Complications: No apparent anesthesia complications

## 2013-01-24 NOTE — Op Note (Signed)
[facility] [place], [id number]   ENDOSCOPY PROCEDURE REPORT  PATIENT: [name], [name]  MR#: [id number] BIRTHDATE: 1960-06-18 , 52  yrs. old GENDER: Male ENDOSCOPIST: Lafayette Dragon, MD REFERRED BY:  Trauma MD PROCEDURE DATE:  01/24/2013 PROCEDURE:  EGD w/ biopsy ASA CLASS:     Class II INDICATIONS:  Hematemesis.   Acute post hemorrhagic anemia. history of Barrett's esophagus. MEDICATIONS: MAC sedation, administered by CRNA TOPICAL ANESTHETIC: Cetacaine Spray  DESCRIPTION OF PROCEDURE: After the risks benefits and alternatives of the procedure were thoroughly explained, informed consent was obtained.  The Pentax Gastroscope [id number] endoscope was introduced through the mouth and advanced to the second portion of the duodenum. Without limitations.  The instrument was slowly withdrawn as the mucosa was fully examined.        ESOPHAGUS: Abnormal mucosa was found in the lower third of the esophagus.  The mucosa had deep ulcers, superficial ulcers and erosions.  Multiple biopsies were performed using cold forceps.There was contact bleeding and nodularity, c/w Grade C esophagitis, deep serpigenous ulcerations., no stricture STOMACH : normal mucosa, no blood in the stomach DUODENUM: patchy duodenitis, erosions, no ulcer, no bleeding, biopsies taken Retroflexed views revealed no abnormalities.     The scope was then withdrawn from the patient and the procedure completed.  COMPLICATIONS: There were no complications. ENDOSCOPIC IMPRESSION: Abnormal mucosa was found in the lower third of the esophagus; The mucosa had deep ulcers, superficial ulcers and erosions; multiple biopsies - Grade 3 esophagitis Hx of Barrett's esophagus Moderately severe duodenitis  RECOMMENDATIONS: 1.  Await pathology results 2.  Anti-reflux regimen to be follow 3.  Continue PPI 4.  add Carafate slurry qid advance diet no alcohol  REPEAT EXAM: for EGD  pending biopsy results.  eSigned:  Lafayette Dragon, MD 01/24/2013 10:39 AM   CC:  PATIENT NAME:  Raynor, Calcaterra MR#: [id number]

## 2013-01-24 NOTE — Transfer of Care (Signed)
Immediate Anesthesia Transfer of Care Note  Patient: Jesse Hartman  Procedure(s) Performed: Procedure(s): ESOPHAGOGASTRODUODENOSCOPY (EGD) (N/A)  Patient Location: PACU and Endoscopy Unit  Anesthesia Type:MAC  Level of Consciousness: awake, alert  and oriented  Airway & Oxygen Therapy: Patient Spontanous Breathing  Post-op Assessment: Report given to PACU RN  Post vital signs: Reviewed and stable  Complications: No apparent anesthesia complications

## 2013-01-24 NOTE — Progress Notes (Signed)
PT Cancellation Note  Patient Details Name: Jesse Hartman MRN: [id number] DOB: [birth year]   Cancelled Treatment:    Reason Eval/Treat Not Completed: Patient at procedure or test/unavailable. Patient in surgery today. Will follow up as appropriate in AM.   Daneen Volcy, Tonia Brooms 01/24/2013, 9:32 AM

## 2013-01-24 NOTE — Anesthesia Postprocedure Evaluation (Signed)
  Anesthesia Post-op Note  Patient: Jesse Hartman  Procedure(s) Performed: Procedure(s): ESOPHAGOGASTRODUODENOSCOPY (EGD) (N/A)  Patient Location: PACU and Endoscopy Unit  Anesthesia Type:MAC  Level of Consciousness: awake  Airway and Oxygen Therapy: Patient Spontanous Breathing  Post-op Pain: none  Post-op Assessment: Post-op Vital signs reviewed, Patient's Cardiovascular Status Stable, Respiratory Function Stable, Patent Airway, No signs of Nausea or vomiting and Pain level controlled  Post-op Vital Signs: Reviewed and stable  Complications: No apparent anesthesia complications
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
## 2013-01-24 NOTE — Progress Notes (Signed)
Down in Endo - appreciate GI eval. Agree Georganna Skeans, MD, MPH, FACS Pager: [phone]

## 2013-01-24 NOTE — Preoperative (Signed)
Beta Blockers   Reason not to administer Beta Blockers:Not Applicable 
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
## 2013-01-25 ENCOUNTER — Encounter (HOSPITAL_COMMUNITY): Payer: Self-pay | Admitting: Internal Medicine

## 2013-01-25 DIAGNOSIS — D62 Acute posthemorrhagic anemia: Secondary | ICD-10-CM

## 2013-01-25 LAB — CBC
HEMATOCRIT: 21.6 % — AB (ref 39.0–52.0)
HEMATOCRIT: 22.9 % — AB (ref 39.0–52.0)
Hemoglobin: 7.6 g/dL — ABNORMAL LOW (ref 13.0–17.0)
Hemoglobin: 8 g/dL — ABNORMAL LOW (ref 13.0–17.0)
MCH: 32.6 pg (ref 26.0–34.0)
MCH: 32.7 pg (ref 26.0–34.0)
MCHC: 35.2 g/dL (ref 30.0–36.0)
MCHC: 34.9 g/dL (ref 30.0–36.0)
MCV: 92.7 fL (ref 78.0–100.0)
MCV: 93.5 fL (ref 78.0–100.0)
Platelets: 175 10*3/uL (ref 150–400)
Platelets: 188 10*3/uL (ref 150–400)
RBC: 2.33 MIL/uL — AB (ref 4.22–5.81)
RBC: 2.45 MIL/uL — AB (ref 4.22–5.81)
RDW: 15.7 % — ABNORMAL HIGH (ref 11.5–15.5)
RDW: 15.3 % (ref 11.5–15.5)
WBC: 5.1 10*3/uL (ref 4.0–10.5)
WBC: 5.6 10*3/uL (ref 4.0–10.5)

## 2013-01-25 MED ORDER — DSS 100 MG PO CAPS
100.0000 mg | ORAL_CAPSULE | Freq: Two times a day (BID) | ORAL | Status: DC
Start: 2013-01-25 — End: 2013-06-21

## 2013-01-25 MED ORDER — THIAMINE HCL 100 MG PO TABS: 100.0000 mg | ORAL_TABLET | Freq: Every day | ORAL | Status: DC

## 2013-01-25 MED ORDER — MUPIROCIN 2 % EX OINT: 1.0000 "application " | TOPICAL_OINTMENT | Freq: Two times a day (BID) | CUTANEOUS | Status: AC

## 2013-01-25 MED ORDER — TRAMADOL HCL 50 MG PO TABS: 100.0000 mg | ORAL_TABLET | Freq: Four times a day (QID) | ORAL | Status: DC | PRN

## 2013-01-25 MED ORDER — PANTOPRAZOLE SODIUM 40 MG PO TBEC
40.0000 mg | DELAYED_RELEASE_TABLET | Freq: Two times a day (BID) | ORAL | Status: DC
  Administered 2013-01-25 – 2013-01-26 (×2): 40 mg via ORAL
  Filled 2013-01-25 (×2): qty 1

## 2013-01-25 MED ORDER — OXYCODONE HCL 5 MG PO TABS: 5.0000 mg | ORAL_TABLET | Freq: Four times a day (QID) | ORAL | Status: DC | PRN

## 2013-01-25 MED ORDER — FOLIC ACID 1 MG PO TABS: 1.0000 mg | ORAL_TABLET | Freq: Every day | ORAL | Status: DC

## 2013-01-25 MED ORDER — TIZANIDINE HCL 4 MG PO TABS: 4.0000 mg | ORAL_TABLET | Freq: Three times a day (TID) | ORAL | Status: DC | PRN

## 2013-01-25 MED ORDER — PANTOPRAZOLE SODIUM 40 MG PO TBEC: 40.0000 mg | DELAYED_RELEASE_TABLET | Freq: Two times a day (BID) | ORAL | Status: DC

## 2013-01-25 MED ORDER — POLYETHYLENE GLYCOL 3350 17 G PO PACK: 17.0000 g | PACK | Freq: Every day | ORAL | Status: DC | PRN

## 2013-01-25 NOTE — Progress Notes (Signed)
Spoke with Silvestre Gunner, PA about pt's blood pressure of 93/58 pulse of 103 and pt having pain after working with physical therapy. Legrand Como stated to give pt the pain medication and monitor blood pressure afterwards. Blood pressure re-checked an hour following, BP 90/47, pulse 100. Notified Legrand Como of continued low pressure. Was given the verbal order to check blood pressures every hour X4 hours, if systolic pressure drops below 90 to give a unit of blood. Will continue to monitor.
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
## 2013-01-25 NOTE — Discharge Instructions (Signed)
Alcohol Use Disorder °Alcohol use disorder is a mental disorder. It is not a one-time incident of heavy drinking. Alcohol use disorder is the excessive and uncontrollable use of alcohol over time that leads to problems with functioning in one or more areas of daily living. People with this disorder risk harming themselves and others when they drink to excess. Alcohol use disorder also can cause other mental disorders, such as mood and anxiety disorders, and serious physical problems. People with alcohol use disorder often misuse other drugs.  °Alcohol use disorder is common and widespread. Some people with this disorder drink alcohol to cope with or escape from negative life events. Others drink to relieve chronic pain or symptoms of mental illness. People with a family history of alcohol use disorder are at higher risk of losing control and using alcohol to excess.  °SYMPTOMS  °Signs and symptoms of alcohol use disorder may include the following:  °· Consumption of alcohol in larger amounts or over a longer period of time than intended. °· Multiple unsuccessful attempts to cut down or control alcohol use.   °· A great deal of time spent obtaining alcohol, using alcohol, or recovering from the effects of alcohol (hangover). °· A strong desire or urge to use alcohol (cravings).   °· Continued use of alcohol despite problems at work, school, or home because of alcohol use.   °· Continued use of alcohol despite problems in relationships because of alcohol use. °· Continued use of alcohol in situations when it is physically hazardous, such as driving a car. °· Continued use of alcohol despite awareness of a physical or psychological problem that is likely related to alcohol use. Physical problems related to alcohol use can involve the brain, heart, liver, stomach, and intestines. Psychological problems related to alcohol use include intoxication, depression, anxiety, psychosis, delirium, and dementia.   °· The need for  increased amounts of alcohol to achieve the same desired effect, or a decreased effect from the consumption of the same amount of alcohol (tolerance). °· Withdrawal symptoms upon reducing or stopping alcohol use, or alcohol use to reduce or avoid withdrawal symptoms. Withdrawal symptoms include: °· Racing heart. °· Hand tremor. °· Difficulty sleeping. °· Nausea. °· Vomiting. °· Hallucinations. °· Restlessness. °· Seizures. °DIAGNOSIS °Alcohol use disorder is diagnosed through an assessment by your caregiver. Your caregiver may start by asking three or four questions to screen for excessive or problematic alcohol use. To confirm a diagnosis of alcohol use disorder, at least two symptoms (see SYMPTOMS) must be present within a 12-month period. The severity of alcohol use disorder depends on the number of symptoms: °· Mild two or three. °· Moderate four or five. °· Severe six or more. °Your caregiver may perform a physical exam or use results from lab tests to see if you have physical problems resulting from alcohol use. Your caregiver may refer you to a mental health professional for evaluation. °TREATMENT  °Some people with alcohol use disorder are able to reduce their alcohol use to low-risk levels. Some people with alcohol use disorder need to quit drinking alcohol. When necessary, mental health professionals with specialized training in substance use treatment can help. Your caregiver can help you decide how severe your alcohol use disorder is and what type of treatment you need. The following forms of treatment are available:  °· Detoxification. Detoxification involves the use of prescription medication to prevent alcohol withdrawal symptoms in the first week after quitting. This is important for people with a history of symptoms of withdrawal and for heavy   drinkers who are likely to have withdrawal symptoms. Alcohol withdrawal can be dangerous and, in severe cases, cause death. Detoxification is usually provided  in a hospital or in-patient substance use treatment facility.  Counseling or talk therapy. Talk therapy is provided by substance use treatment counselors. It addresses the reasons people use alcohol and ways to keep them from drinking again. The goals of talk therapy are to help people with alcohol use disorder find healthy activities and ways to cope with life stress, to identify and avoid triggers for alcohol use, and to handle cravings, which can cause relapse.  Medication.Different medications can help treat alcohol use disorder through the following actions:  Decrease alcohol cravings.  Decrease the positive reward response felt from alcohol use.  Produce an uncomfortable physical reaction when alcohol is used (aversion therapy).  Support groups. Support groups are run by people who have quit drinking. They provide emotional support, advice, and guidance. These forms of treatment are often combined. Some people with alcohol use disorder benefit from intensive combination treatment provided by specialized substance use treatment centers. Both inpatient and outpatient treatment programs are available. Document Released: 01/30/2004 Document Revised: 08/24/2012 Document Reviewed: 03/31/2012 [facility] Patient Information 2014 [place].    Alcohol and Nutrition Nutrition serves two purposes. It provides energy. It also maintains body structure and function. Food supplies energy. It also provides the building blocks needed to replace worn or damaged cells. Alcoholics often eat poorly. This limits their supply of essential nutrients. This affects energy supply and structure maintenance. Alcohol also affects the body's nutrients in:  Digestion.  Storage.  Using and getting rid of waste products. IMPAIRMENT OF NUTRIENT DIGESTION AND UTILIZATION   Once ingested, food must be broken down into small components (digested). Then it is available for energy. It helps maintain body structure  and function. Digestion begins in the mouth. It continues in the stomach and intestines, with help from the pancreas. The nutrients from digested food are absorbed from the intestines into the blood. Then they are carried to the liver. The liver prepares nutrients for:  Immediate use.  Storage and future use.  Alcohol inhibits the breakdown of nutrients into usable molecules.  It decreases secretion of digestive enzymes from the pancreas.  Alcohol impairs nutrient absorption by damaging the cells lining the stomach and intestines.  It also interferes with moving some nutrients into the blood.  In addition, nutritional deficiencies themselves may lead to further absorption problems.  For example, folate deficiency changes the cells that line the small intestine. This impairs how water is absorbed. It also affects absorbed nutrients. These include glucose, sodium, and additional folate.  Even if nutrients are digested and absorbed, alcohol can prevent them from being fully used. It changes their transport, storage, and excretion. Impaired utilization of nutrients by alcoholics is indicated by:  Decreased liver stores of vitamins, such as vitamin A.  Increased excretion of nutrients such as fat. ALCOHOL AND ENERGY SUPPLY   Three basic nutritional components found in food are:  Carbohydrates.  Proteins.  Fats.  These are used as energy. Some alcoholics take in as much as 50% of their total daily calories from alcohol. They often neglect important foods.  Even when enough food is eaten, alcohol can impair the ways the body controls blood sugar (glucose) levels. It may either increase or decrease blood sugar.  In non-diabetic alcoholics, increased blood sugar (hyperglycemia) is caused by poor insulin secretion. It is usually temporary.  Decreased blood sugar (hypoglycemia) can cause  serious injury even if this condition is short-lived. Low blood sugar can happen when a fasting or  malnourished person drinks alcohol. When there is no food to supply energy, stored sugar is used up. The products of alcohol inhibit forming glucose from other compounds such as amino acids. As a result, alcohol causes the brain and other body tissue to lack glucose. It is needed for energy and function.  Alcohol is an energy source. But how the body processes and uses the energy from alcohol is complex. Also, when alcohol is substituted for carbohydrates, subjects tend to lose weight. This indicates that they get less energy from alcohol than from food. ALCOHOL - MAINTAINING CELL STRUCTURE AND FUNCTION  Structure Cells are made mostly of protein. So an adequate protein diet is important for maintaining cell structure. This is especially true if cells are being damaged. Research indicates that alcohol affects protein nutrition by causing impaired:  Digestion of proteins to amino acids.  Processing of amino acids by the small intestine and liver.  Synthesis of proteins from amino acids.  Protein secretion by the liver. Function Nutrients are essential for the body to function well. They provide the tools that the body needs to work well:   Proteins.  Vitamins.  Minerals. Alcohol can disrupt body function. It may cause nutrient deficiencies. And it may interfere with the way nutrients are processed. Vitamins  Vitamins are essential to maintain growth and normal metabolism. They regulate many of the body`s processes. Chronic heavy drinking causes deficiencies in many vitamins. This is caused by eating less. And, in some cases, vitamins may be poorly absorbed. For example, alcohol inhibits fat absorption. It impairs how the vitamins A, E, and D are normally absorbed along with dietary fats. Not enough vitamin A may cause night blindness. Not enough vitamin D may cause softening of the bones.  Some alcoholics lack vitamins A, C, D, E, K, and the B vitamins. These are all involved in wound  healing and cell maintenance. In particular, because vitamin K is necessary for blood clotting, lacking that vitamin can cause delayed clotting. The result is excess bleeding. Lacking other vitamins involved in brain function may cause severe neurological damage. Minerals Deficiencies of minerals such as calcium, magnesium, iron, and zinc are common in alcoholics. The alcohol itself does not seem to affect how these minerals are absorbed. Rather, they seem to occur secondary to other alcohol-related problems, such as:  Less calcium absorbed.  Not enough magnesium.  More urinary excretion.  Vomiting.  Diarrhea.  Not enough iron due to gastrointestinal bleeding.  Not enough zinc or losses related to other nutrient deficiencies.  Mineral deficiencies can cause a variety of medical consequences. These range from calcium-related bone disease to zinc-related night blindness and skin lesions. ALCOHOL, MALNUTRITION, AND MEDICAL COMPLICATIONS  Liver Disease   Alcoholic liver damage is caused primarily by alcohol itself. But poor nutrition may increase the risk of alcohol-related liver damage. For example, nutrients normally found in the liver are known to be affected by drinking alcohol. These include carotenoids, which are the major sources of vitamin A, and vitamin E compounds. Decreases in such nutrients may play some role in alcohol-related liver damage. Pancreatitis  Research suggests that malnutrition may increase the risk of developing alcoholic pancreatitis. Research suggests that a diet lacking in protein may increase alcohol's damaging effect on the pancreas. Brain  Nutritional deficiencies may have severe effects on brain function. These may be permanent. Specifically, thiamine deficiencies are often seen in  alcoholics. They can cause severe neurological problems. These include:  Impaired movement.  Memory loss seen in Wernicke-Korsakoff syndrome. Pregnancy  Alcohol has toxic  effects on fetal development. It causes alcohol-related birth defects. They include fetal alcohol syndrome. Alcohol itself is toxic to the fetus. Also, the nutritional deficiency can affect how the fetus develops. That may compound the risk of developmental damage.  Nutritional needs during pregnancy are 10% to 30% greater than normal. Food intake can increase by as much as 140% to cover the needs of both mother and fetus. An alcoholic mother`s nutritional problems may adversely affect the nutrition of the fetus. And alcohol itself can also restrict nutrition flow to the fetus. NUTRITIONAL STATUS OF ALCOHOLICS  Techniques for assessing nutritional status include:  Taking body measurements to estimate fat reserves. They include:  Weight.  Height.  Mass.  Skin fold thickness.  Performing blood analysis to provide measurements of circulating:  Proteins.  Vitamins.  Minerals.  These techniques tend to be imprecise. For many nutrients, there is no clear "cut-off" point that would allow an accurate definition of deficiency. So assessing the nutritional status of alcoholics is limited by these techniques. Dietary status may provide information about the risk of developing nutritional problems. Dietary status is assessed by:  Taking patients' dietary histories.  Evaluating the amount and types of food they are eating.  It is difficult to determine what exact amount of alcohol begins to have damaging effects on nutrition. In general, moderate drinkers have 2 drinks or less per day. They seem to be at little risk for nutritional problems. Various medical disorders begin to appear at greater levels.  Research indicates that the majority of even the heaviest drinkers have few obvious nutritional deficiencies. Many alcoholics who are hospitalized for medical complications of their disease do have severe malnutrition. Alcoholics tend to eat poorly. Often they eat less than the amounts of food  necessary to provide enough:  Carbohydrates.  Protein.  Fat.  Vitamins A and C.  B vitamins.  Minerals like calcium and iron. Of major concern is alcohol's effect on digesting food and use of nutrients. It may shift a mildly malnourished person toward severe malnutrition. Document Released: 10/16/2004 Document Revised: 03/16/2011 Document Reviewed: 04/01/2005 [facility] Patient Information 2014 [place].
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
## 2013-01-25 NOTE — Clinical Social Work Note (Signed)
Clinical Social Worker met with patient, patient daughter, and patient sister to further offer support and discuss patient plan at discharge.  Patient daughter has made contact with [facility] and is hopeful to follow up with them at discharge.  Patient sister is returning to [place], [place] but is available by phone.  Patient sister provided $62 cash for patient prescriptions at discharge - CSW has placed money down in security and form to be placed on the front of patient chart.    Clinical Social Worker will sign off for now as social work intervention is no longer needed. Please consult [place] again if new need arises.  Barbette Or, [place]
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
## 2013-01-25 NOTE — Progress Notes (Signed)
I have seen and examined the pt and agree with PA-Jeffery's progress note. Repeat CBC pending this PM-Agree if looks stable can be dc'd
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
## 2013-01-26 ENCOUNTER — Encounter: Payer: Self-pay | Admitting: Internal Medicine

## 2013-01-26 LAB — HEMOGLOBIN AND HEMATOCRIT, BLOOD
HCT: 20.6 % — ABNORMAL LOW (ref 39.0–52.0)
Hemoglobin: 7.1 g/dL — ABNORMAL LOW (ref 13.0–17.0)

## 2013-01-26 LAB — CBC
HEMATOCRIT: 22.2 % — AB (ref 39.0–52.0)
Hemoglobin: 7.9 g/dL — ABNORMAL LOW (ref 13.0–17.0)
MCH: 32.8 pg (ref 26.0–34.0)
MCHC: 35.6 g/dL (ref 30.0–36.0)
MCV: 92.1 fL (ref 78.0–100.0)
Platelets: 248 10*3/uL (ref 150–400)
RBC: 2.41 MIL/uL — AB (ref 4.22–5.81)
RDW: 14.9 % (ref 11.5–15.5)
WBC: 5.9 10*3/uL (ref 4.0–10.5)

## 2013-01-26 MED ORDER — ALBUTEROL SULFATE HFA 108 (90 BASE) MCG/ACT IN AERS: 2.0000 | INHALATION_SPRAY | Freq: Four times a day (QID) | RESPIRATORY_TRACT | Status: DC | PRN

## 2013-01-26 MED ORDER — BUDESONIDE-FORMOTEROL FUMARATE 160-4.5 MCG/ACT IN AERO: 2.0000 | INHALATION_SPRAY | Freq: Two times a day (BID) | RESPIRATORY_TRACT | Status: DC

## 2013-01-26 NOTE — Discharge Summary (Signed)
Hgb stable.  Platelets are okay.  Okay to go home.  This patient has been seen and I agree with the findings and treatment plan.  Kathryne Eriksson. Dahlia Bailiff, MD, [place] [phone] (pager) [phone] (direct pager) Trauma Surgeon

## 2013-01-26 NOTE — Progress Notes (Signed)
Patient looks fine.  May be able to go home if hemoglobin remains stable tomorrow.  This patient has been seen and I agree with the findings and treatment plan.  Kathryne Eriksson. Dahlia Bailiff, MD, [place] [phone] (pager) [phone] (direct pager) Trauma Surgeon
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
## 2013-01-26 NOTE — Progress Notes (Signed)
Pt enrolled into the [facility] program for medication assistance. Letter given to patient along with list of participating pharmacies and the process explained. Pt reported that his sister had already explained all of this to him and that it made sense to him.  He requested to keep the Rx in his room with the letter. I left them with pt and advised the assigned RN where they were.
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
## 2013-01-27 LAB — TYPE AND SCREEN
ABO/RH(D): A POS
Antibody Screen: NEGATIVE
UNIT DIVISION: 0
UNIT DIVISION: 0
Unit division: 0
Unit division: 0

## 2013-02-12 ENCOUNTER — Emergency Department (HOSPITAL_BASED_OUTPATIENT_CLINIC_OR_DEPARTMENT_OTHER): Payer: Medicaid Other

## 2013-02-12 ENCOUNTER — Encounter (HOSPITAL_BASED_OUTPATIENT_CLINIC_OR_DEPARTMENT_OTHER): Payer: Self-pay | Admitting: Emergency Medicine

## 2013-02-12 ENCOUNTER — Emergency Department (HOSPITAL_BASED_OUTPATIENT_CLINIC_OR_DEPARTMENT_OTHER)
Admission: EM | Admit: 2013-02-12 | Discharge: 2013-02-12 | Disposition: A | Discharge status: Home / Self Care | Payer: Medicaid Other | Attending: Emergency Medicine | Admitting: Emergency Medicine

## 2013-02-12 DIAGNOSIS — I10 Essential (primary) hypertension: Secondary | ICD-10-CM | POA: Insufficient documentation

## 2013-02-12 DIAGNOSIS — Z86718 Personal history of other venous thrombosis and embolism: Secondary | ICD-10-CM | POA: Insufficient documentation

## 2013-02-12 DIAGNOSIS — Z8639 Personal history of other endocrine, nutritional and metabolic disease: Secondary | ICD-10-CM | POA: Insufficient documentation

## 2013-02-12 DIAGNOSIS — J449 Chronic obstructive pulmonary disease, unspecified: Secondary | ICD-10-CM | POA: Insufficient documentation

## 2013-02-12 DIAGNOSIS — Z9089 Acquired absence of other organs: Secondary | ICD-10-CM | POA: Insufficient documentation

## 2013-02-12 DIAGNOSIS — A084 Viral intestinal infection, unspecified: Secondary | ICD-10-CM

## 2013-02-12 DIAGNOSIS — K219 Gastro-esophageal reflux disease without esophagitis: Secondary | ICD-10-CM | POA: Insufficient documentation

## 2013-02-12 DIAGNOSIS — Z79899 Other long term (current) drug therapy: Secondary | ICD-10-CM | POA: Insufficient documentation

## 2013-02-12 DIAGNOSIS — M129 Arthropathy, unspecified: Secondary | ICD-10-CM | POA: Insufficient documentation

## 2013-02-12 DIAGNOSIS — IMO0002 Reserved for concepts with insufficient information to code with codable children: Secondary | ICD-10-CM | POA: Insufficient documentation

## 2013-02-12 DIAGNOSIS — Z862 Personal history of diseases of the blood and blood-forming organs and certain disorders involving the immune mechanism: Secondary | ICD-10-CM | POA: Insufficient documentation

## 2013-02-12 DIAGNOSIS — Z9889 Other specified postprocedural states: Secondary | ICD-10-CM | POA: Insufficient documentation

## 2013-02-12 DIAGNOSIS — K2270 Barrett's esophagus without dysplasia: Secondary | ICD-10-CM | POA: Insufficient documentation

## 2013-02-12 DIAGNOSIS — J4489 Other specified chronic obstructive pulmonary disease: Secondary | ICD-10-CM | POA: Insufficient documentation

## 2013-02-12 DIAGNOSIS — A088 Other specified intestinal infections: Secondary | ICD-10-CM | POA: Insufficient documentation

## 2013-02-12 LAB — COMPREHENSIVE METABOLIC PANEL
ALT: 19 U/L (ref 0–53)
AST: 17 U/L (ref 0–37)
Albumin: 3.3 g/dL — ABNORMAL LOW (ref 3.5–5.2)
Alkaline Phosphatase: 60 U/L (ref 39–117)
BUN: 17 mg/dL (ref 6–23)
CALCIUM: 8.7 mg/dL (ref 8.4–10.5)
CHLORIDE: 104 meq/L (ref 96–112)
CO2: 21 mEq/L (ref 19–32)
CREATININE: 0.9 mg/dL (ref 0.50–1.35)
GFR calc non Af Amer: >90 mL/min (ref >90)
GLUCOSE: 107 mg/dL — AB (ref 70–99)
Potassium: 4.2 mEq/L (ref 3.7–5.3)
Sodium: 138 mEq/L (ref 137–147)
Total Bilirubin: 0.3 mg/dL (ref 0.3–1.2)
Total Protein: 6.5 g/dL (ref 6.0–8.3)

## 2013-02-12 LAB — CBC WITH DIFFERENTIAL/PLATELET
Basophils Absolute: 0 10*3/uL (ref 0.0–0.1)
Basophils Relative: 0 % (ref 0–1)
EOS PCT: 1 % (ref 0–5)
Eosinophils Absolute: 0.1 10*3/uL (ref 0.0–0.7)
HEMATOCRIT: 30.8 % — AB (ref 39.0–52.0)
HEMOGLOBIN: 10 g/dL — AB (ref 13.0–17.0)
LYMPHS ABS: 0.4 10*3/uL — AB (ref 0.7–4.0)
LYMPHS PCT: 7 % — AB (ref 12–46)
MCH: 28.5 pg (ref 26.0–34.0)
MCHC: 32.5 g/dL (ref 30.0–36.0)
MCV: 87.7 fL (ref 78.0–100.0)
MONO ABS: 0.3 10*3/uL (ref 0.1–1.0)
MONOS PCT: 4 % (ref 3–12)
NEUTROS ABS: 5.6 10*3/uL (ref 1.7–7.7)
Neutrophils Relative %: 88 % — ABNORMAL HIGH (ref 43–77)
Platelets: 250 10*3/uL (ref 150–400)
RBC: 3.51 MIL/uL — ABNORMAL LOW (ref 4.22–5.81)
RDW: 15.2 % (ref 11.5–15.5)
WBC: 6.3 10*3/uL (ref 4.0–10.5)

## 2013-02-12 MED ORDER — PROMETHAZINE HCL 25 MG/ML IJ SOLN
25.0000 mg | Freq: Once | INTRAMUSCULAR | Status: AC
  Administered 2013-02-12: 25 mg via INTRAVENOUS
  Filled 2013-02-12: qty 1

## 2013-02-12 MED ORDER — KETOROLAC TROMETHAMINE 30 MG/ML IJ SOLN
30.0000 mg | Freq: Once | INTRAMUSCULAR | Status: AC
  Administered 2013-02-12: 30 mg via INTRAVENOUS
  Filled 2013-02-12: qty 1

## 2013-02-12 MED ORDER — ONDANSETRON 8 MG PO TBDP
8.0000 mg | ORAL_TABLET | Freq: Once | ORAL | Status: AC
  Administered 2013-02-12: 8 mg via ORAL
  Filled 2013-02-12: qty 1

## 2013-02-12 MED ORDER — SODIUM CHLORIDE 0.9 % IV SOLN
1000.0000 mL | Freq: Once | INTRAVENOUS | Status: AC
  Administered 2013-02-12: 1000 mL via INTRAVENOUS

## 2013-02-12 MED ORDER — PROMETHAZINE HCL 25 MG RE SUPP: 25.0000 mg | Freq: Four times a day (QID) | RECTAL | Status: DC | PRN

## 2013-02-12 MED ORDER — SODIUM CHLORIDE 0.9 % IV BOLUS (SEPSIS)
1000.0000 mL | Freq: Once | INTRAVENOUS | Status: AC
  Administered 2013-02-12: 1000 mL via INTRAVENOUS
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
## 2013-02-12 NOTE — ED Notes (Signed)
I assisted patient with calling his ride from [facility]

## 2013-02-12 NOTE — ED Notes (Addendum)
This morning began having N/V/D. Also from [facility], not to have narcotic pain medication.

## 2013-02-12 NOTE — Discharge Instructions (Signed)
Viral Gastroenteritis Viral gastroenteritis is also called stomach flu. This illness is caused by a certain type of germ (virus). It can cause sudden watery poop (diarrhea) and throwing up (vomiting). This can cause you to lose body fluids (dehydration). This illness usually lasts for 3 to 8 days. It usually goes away on its own. HOME CARE   Drink enough fluids to keep your pee (urine) clear or pale yellow. Drink small amounts of fluids often.  Ask your doctor how to replace body fluid losses (rehydration).  Avoid:  Foods high in sugar.  Alcohol.  Bubbly (carbonated) drinks.  Tobacco.  Juice.  Caffeine drinks.  Very hot or cold fluids.  Fatty, greasy foods.  Eating too much at one time.  Dairy products until 24 to 48 hours after your watery poop stops.  You may eat foods with active cultures (probiotics). They can be found in some yogurts and supplements.  Wash your hands well to avoid spreading the illness.  Only take medicines as told by your doctor. Do not give aspirin to children. Do not take medicines for watery poop (antidiarrheals).  Ask your doctor if you should keep taking your regular medicines.  Keep all doctor visits as told. GET HELP RIGHT AWAY IF:   You cannot keep fluids down.  You do not pee at least once every 6 to 8 hours.  You are short of breath.  You see blood in your poop or throw up. This may look like coffee grounds.  You have belly (abdominal) pain that gets worse or is just in one small spot (localized).  You keep throwing up or having watery poop.  You have a fever.  The patient is a child younger than 3 months, and he or she has a fever.  The patient is a child older than 3 months, and he or she has a fever and problems that do not go away.  The patient is a child older than 3 months, and he or she has a fever and problems that suddenly get worse.  The patient is a baby, and he or she has no tears when crying. MAKE SURE YOU:     Understand these instructions.  Will watch your condition.  Will get help right away if you are not doing well or get worse. Document Released: 06/10/2007 Document Revised: 03/16/2011 Document Reviewed: 10/08/2010 [facility] Patient Information 2014 [place].

## 2013-02-12 NOTE — ED Notes (Signed)
Patient is asking for pain meds, states chest is hurting where ribs join spine.

## 2013-06-02 DIAGNOSIS — Z8719 Personal history of other diseases of the digestive system: Secondary | ICD-10-CM | POA: Insufficient documentation

## 2013-06-21 ENCOUNTER — Emergency Department (HOSPITAL_COMMUNITY): Payer: Medicaid Other

## 2013-06-21 ENCOUNTER — Encounter (HOSPITAL_COMMUNITY): Payer: Self-pay | Admitting: Emergency Medicine

## 2013-06-21 ENCOUNTER — Inpatient Hospital Stay (HOSPITAL_COMMUNITY)
Admission: EM | Admit: 2013-06-21 | Discharge: 2013-06-23 | DRG: 381 | Disposition: A | Discharge status: Home / Self Care | Payer: Medicaid Other | Attending: Family Medicine | Admitting: Family Medicine

## 2013-06-21 DIAGNOSIS — F1010 Alcohol abuse, uncomplicated: Secondary | ICD-10-CM

## 2013-06-21 DIAGNOSIS — J449 Chronic obstructive pulmonary disease, unspecified: Secondary | ICD-10-CM

## 2013-06-21 DIAGNOSIS — Z833 Family history of diabetes mellitus: Secondary | ICD-10-CM

## 2013-06-21 DIAGNOSIS — D62 Acute posthemorrhagic anemia: Secondary | ICD-10-CM

## 2013-06-21 DIAGNOSIS — J9601 Acute respiratory failure with hypoxia: Secondary | ICD-10-CM

## 2013-06-21 DIAGNOSIS — G9340 Encephalopathy, unspecified: Secondary | ICD-10-CM

## 2013-06-21 DIAGNOSIS — R4189 Other symptoms and signs involving cognitive functions and awareness: Secondary | ICD-10-CM

## 2013-06-21 DIAGNOSIS — J4489 Other specified chronic obstructive pulmonary disease: Secondary | ICD-10-CM | POA: Diagnosis present

## 2013-06-21 DIAGNOSIS — M129 Arthropathy, unspecified: Secondary | ICD-10-CM | POA: Diagnosis present

## 2013-06-21 DIAGNOSIS — K2100 Gastro-esophageal reflux disease with esophagitis, without bleeding: Secondary | ICD-10-CM

## 2013-06-21 DIAGNOSIS — K2210 Ulcer of esophagus without bleeding: Secondary | ICD-10-CM

## 2013-06-21 DIAGNOSIS — G4730 Sleep apnea, unspecified: Secondary | ICD-10-CM

## 2013-06-21 DIAGNOSIS — K208 Other esophagitis without bleeding: Secondary | ICD-10-CM | POA: Diagnosis present

## 2013-06-21 DIAGNOSIS — K2211 Ulcer of esophagus with bleeding: Principal | ICD-10-CM | POA: Diagnosis present

## 2013-06-21 DIAGNOSIS — K2270 Barrett's esophagus without dysplasia: Secondary | ICD-10-CM | POA: Diagnosis present

## 2013-06-21 DIAGNOSIS — K920 Hematemesis: Secondary | ICD-10-CM

## 2013-06-21 DIAGNOSIS — I10 Essential (primary) hypertension: Secondary | ICD-10-CM | POA: Diagnosis present

## 2013-06-21 DIAGNOSIS — J441 Chronic obstructive pulmonary disease with (acute) exacerbation: Secondary | ICD-10-CM

## 2013-06-21 DIAGNOSIS — K219 Gastro-esophageal reflux disease without esophagitis: Secondary | ICD-10-CM | POA: Diagnosis present

## 2013-06-21 DIAGNOSIS — K922 Gastrointestinal hemorrhage, unspecified: Secondary | ICD-10-CM

## 2013-06-21 DIAGNOSIS — Z86718 Personal history of other venous thrombosis and embolism: Secondary | ICD-10-CM

## 2013-06-21 DIAGNOSIS — F17200 Nicotine dependence, unspecified, uncomplicated: Secondary | ICD-10-CM | POA: Diagnosis present

## 2013-06-21 HISTORY — DX: Diaphragmatic hernia without obstruction or gangrene: K44.9

## 2013-06-21 HISTORY — DX: Ulcer of esophagus without bleeding: K22.10

## 2013-06-21 HISTORY — DX: Other psychoactive substance abuse, uncomplicated: F19.10

## 2013-06-21 LAB — HEMOGLOBIN AND HEMATOCRIT, BLOOD
HCT: 35.5 % — ABNORMAL LOW (ref 39.0–52.0)
Hemoglobin: 11 g/dL — ABNORMAL LOW (ref 13.0–17.0)

## 2013-06-21 LAB — COMPREHENSIVE METABOLIC PANEL
ALK PHOS: 72 U/L (ref 39–117)
ALT: 23 U/L (ref 0–53)
AST: 28 U/L (ref 0–37)
Albumin: 4 g/dL (ref 3.5–5.2)
BUN: 14 mg/dL (ref 6–23)
CO2: 31 meq/L (ref 19–32)
Calcium: 13.2 mg/dL (ref 8.4–10.5)
Chloride: 88 mEq/L — ABNORMAL LOW (ref 96–112)
Creatinine, Ser: 0.91 mg/dL (ref 0.50–1.35)
GFR calc Af Amer: >90 mL/min (ref >90)
GLUCOSE: 139 mg/dL — AB (ref 70–99)
Potassium: 3.6 mEq/L — ABNORMAL LOW (ref 3.7–5.3)
SODIUM: 135 meq/L — AB (ref 137–147)
TOTAL PROTEIN: 7.8 g/dL (ref 6.0–8.3)
Total Bilirubin: 0.5 mg/dL (ref 0.3–1.2)

## 2013-06-21 LAB — I-STAT TROPONIN, ED: Troponin i, poc: 0.01 ng/mL (ref 0.00–0.08)

## 2013-06-21 LAB — CBC
HCT: 40.7 % (ref 39.0–52.0)
HCT: 37.8 % — ABNORMAL LOW (ref 39.0–52.0)
Hemoglobin: 13.5 g/dL (ref 13.0–17.0)
Hemoglobin: 12.6 g/dL — ABNORMAL LOW (ref 13.0–17.0)
MCH: 25 pg — ABNORMAL LOW (ref 26.0–34.0)
MCH: 25 pg — AB (ref 26.0–34.0)
MCHC: 33.2 g/dL (ref 30.0–36.0)
MCHC: 33.3 g/dL (ref 30.0–36.0)
MCV: 75.4 fL — ABNORMAL LOW (ref 78.0–100.0)
MCV: 75.1 fL — ABNORMAL LOW (ref 78.0–100.0)
PLATELETS: 234 10*3/uL (ref 150–400)
Platelets: 261 10*3/uL (ref 150–400)
RBC: 5.4 MIL/uL (ref 4.22–5.81)
RBC: 5.03 MIL/uL (ref 4.22–5.81)
RDW: 17.3 % — ABNORMAL HIGH (ref 11.5–15.5)
RDW: 17.2 % — AB (ref 11.5–15.5)
WBC: 12.6 10*3/uL — ABNORMAL HIGH (ref 4.0–10.5)
WBC: 13.4 10*3/uL — ABNORMAL HIGH (ref 4.0–10.5)

## 2013-06-21 LAB — BASIC METABOLIC PANEL
BUN: 14 mg/dL (ref 6–23)
CALCIUM: 12.2 mg/dL — AB (ref 8.4–10.5)
CO2: 30 meq/L (ref 19–32)
Chloride: 95 mEq/L — ABNORMAL LOW (ref 96–112)
Creatinine, Ser: 0.96 mg/dL (ref 0.50–1.35)
GFR calc Af Amer: >90 mL/min (ref >90)
GLUCOSE: 136 mg/dL — AB (ref 70–99)
Potassium: 4.3 mEq/L (ref 3.7–5.3)
Sodium: 138 mEq/L (ref 137–147)

## 2013-06-21 LAB — ETHANOL

## 2013-06-21 LAB — LIPASE, BLOOD: Lipase: 43 U/L (ref 11–59)

## 2013-06-21 LAB — PROTIME-INR
INR: 0.92 (ref 0.00–1.49)
Prothrombin Time: 12.2 seconds (ref 11.6–15.2)

## 2013-06-21 LAB — TYPE AND SCREEN
ABO/RH(D): A POS
Antibody Screen: NEGATIVE

## 2013-06-21 LAB — APTT: aPTT: 26 seconds (ref 24–37)

## 2013-06-21 LAB — ABO/RH: ABO/RH(D): A POS

## 2013-06-21 MED ORDER — TIOTROPIUM BROMIDE MONOHYDRATE 18 MCG IN CAPS
18.0000 ug | ORAL_CAPSULE | Freq: Every day | RESPIRATORY_TRACT | Status: DC
  Administered 2013-06-21 – 2013-06-23 (×3): 18 ug via RESPIRATORY_TRACT
  Filled 2013-06-21: qty 5

## 2013-06-21 MED ORDER — SODIUM CHLORIDE 0.9 % IV SOLN: INTRAVENOUS | Status: AC

## 2013-06-21 MED ORDER — SODIUM CHLORIDE 0.9 % IV SOLN: INTRAVENOUS | Status: DC

## 2013-06-21 MED ORDER — POTASSIUM CHLORIDE IN NACL 40-0.9 MEQ/L-% IV SOLN
INTRAVENOUS | Status: DC
  Administered 2013-06-21 – 2013-06-22 (×4): 125 mL/h via INTRAVENOUS
  Filled 2013-06-21 (×8): qty 1000

## 2013-06-21 MED ORDER — ACETAMINOPHEN 325 MG PO TABS
650.0000 mg | ORAL_TABLET | Freq: Four times a day (QID) | ORAL | Status: DC | PRN
Start: 2013-06-21 — End: 2013-06-23
  Administered 2013-06-21: 650 mg via ORAL
  Filled 2013-06-21: qty 2

## 2013-06-21 MED ORDER — LORAZEPAM 2 MG/ML IJ SOLN
0.0000 mg | Freq: Four times a day (QID) | INTRAMUSCULAR | Status: AC
  Administered 2013-06-21 – 2013-06-22 (×4): 1 mg via INTRAVENOUS
  Administered 2013-06-23: 2 mg via INTRAVENOUS
  Filled 2013-06-21 (×3): qty 1

## 2013-06-21 MED ORDER — THIAMINE HCL 100 MG/ML IJ SOLN
100.0000 mg | Freq: Every day | INTRAMUSCULAR | Status: DC
  Filled 2013-06-21 (×3): qty 1

## 2013-06-21 MED ORDER — ACETAMINOPHEN 650 MG RE SUPP: 650.0000 mg | Freq: Four times a day (QID) | RECTAL | Status: DC | PRN

## 2013-06-21 MED ORDER — MORPHINE SULFATE 2 MG/ML IJ SOLN
2.0000 mg | INTRAMUSCULAR | Status: DC | PRN
  Administered 2013-06-21 – 2013-06-22 (×6): 2 mg via INTRAVENOUS
  Filled 2013-06-21 (×6): qty 1

## 2013-06-21 MED ORDER — ALUM & MAG HYDROXIDE-SIMETH 200-200-20 MG/5ML PO SUSP: 30.0000 mL | Freq: Four times a day (QID) | ORAL | Status: DC | PRN

## 2013-06-21 MED ORDER — SUCRALFATE 1 GM/10ML PO SUSP
1.0000 g | Freq: Four times a day (QID) | ORAL | Status: DC
  Administered 2013-06-21 – 2013-06-23 (×6): 1 g via ORAL
  Filled 2013-06-21 (×12): qty 10

## 2013-06-21 MED ORDER — ALBUTEROL SULFATE (2.5 MG/3ML) 0.083% IN NEBU: 2.5000 mg | INHALATION_SOLUTION | Freq: Four times a day (QID) | RESPIRATORY_TRACT | Status: DC | PRN

## 2013-06-21 MED ORDER — VITAMIN B-1 100 MG PO TABS
100.0000 mg | ORAL_TABLET | Freq: Every day | ORAL | Status: DC
  Administered 2013-06-21 – 2013-06-23 (×3): 100 mg via ORAL
  Filled 2013-06-21 (×3): qty 1

## 2013-06-21 MED ORDER — ONDANSETRON HCL 4 MG/2ML IJ SOLN: 4.0000 mg | Freq: Four times a day (QID) | INTRAMUSCULAR | Status: DC | PRN

## 2013-06-21 MED ORDER — IPRATROPIUM-ALBUTEROL 0.5-2.5 (3) MG/3ML IN SOLN
3.0000 mL | Freq: Four times a day (QID) | RESPIRATORY_TRACT | Status: DC
  Administered 2013-06-21 – 2013-06-23 (×9): 3 mL via RESPIRATORY_TRACT
  Filled 2013-06-21 (×11): qty 3

## 2013-06-21 MED ORDER — BUDESONIDE-FORMOTEROL FUMARATE 160-4.5 MCG/ACT IN AERO
2.0000 | INHALATION_SPRAY | Freq: Two times a day (BID) | RESPIRATORY_TRACT | Status: DC
  Administered 2013-06-21 – 2013-06-23 (×5): 2 via RESPIRATORY_TRACT
  Filled 2013-06-21: qty 6

## 2013-06-21 MED ORDER — ONDANSETRON HCL 4 MG/2ML IJ SOLN
4.0000 mg | Freq: Once | INTRAMUSCULAR | Status: AC
  Administered 2013-06-21: 4 mg via INTRAVENOUS
  Filled 2013-06-21: qty 2

## 2013-06-21 MED ORDER — SODIUM CHLORIDE 0.9 % IV SOLN
80.0000 mg | Freq: Once | INTRAVENOUS | Status: AC
  Administered 2013-06-21: 80 mg via INTRAVENOUS
  Filled 2013-06-21: qty 80

## 2013-06-21 MED ORDER — LORAZEPAM 1 MG PO TABS
1.0000 mg | ORAL_TABLET | Freq: Four times a day (QID) | ORAL | Status: DC | PRN
  Administered 2013-06-21 – 2013-06-22 (×2): 1 mg via ORAL
  Filled 2013-06-21 (×2): qty 1

## 2013-06-21 MED ORDER — SODIUM CHLORIDE 0.9 % IV SOLN
8.0000 mg/h | INTRAVENOUS | Status: DC
  Administered 2013-06-21 – 2013-06-22 (×4): 8 mg/h via INTRAVENOUS
  Filled 2013-06-21 (×10): qty 80

## 2013-06-21 MED ORDER — ONDANSETRON HCL 4 MG/2ML IJ SOLN: 4.0000 mg | Freq: Three times a day (TID) | INTRAMUSCULAR | Status: AC | PRN

## 2013-06-21 MED ORDER — LORAZEPAM 2 MG/ML IJ SOLN
0.0000 mg | Freq: Two times a day (BID) | INTRAMUSCULAR | Status: DC
  Filled 2013-06-21: qty 1

## 2013-06-21 MED ORDER — ONDANSETRON HCL 4 MG PO TABS: 4.0000 mg | ORAL_TABLET | Freq: Four times a day (QID) | ORAL | Status: DC | PRN

## 2013-06-21 MED ORDER — SODIUM CHLORIDE 0.9 % IV BOLUS (SEPSIS)
1000.0000 mL | Freq: Once | INTRAVENOUS | Status: AC
  Administered 2013-06-21: 1000 mL via INTRAVENOUS

## 2013-06-21 MED ORDER — LORAZEPAM 2 MG/ML IJ SOLN
1.0000 mg | Freq: Four times a day (QID) | INTRAMUSCULAR | Status: DC | PRN
  Administered 2013-06-23: 1 mg via INTRAVENOUS
  Filled 2013-06-21 (×2): qty 1

## 2013-06-21 MED ORDER — BOOST / RESOURCE BREEZE PO LIQD
1.0000 | Freq: Three times a day (TID) | ORAL | Status: DC
  Administered 2013-06-21 – 2013-06-23 (×4): 1 via ORAL

## 2013-06-21 NOTE — ED Notes (Signed)
Pt desated into low 80's while asleep. Pt placed on 2L 02 by [place] and repositioned.

## 2013-06-21 NOTE — ED Notes (Signed)
PT states that he began vomiting approx 6 hrs ago; pt states that he has vomited approx 12 times in the last 6 hrs; pt states that it began as clear emesis and has progressed to bright red blood and "tissue" per pt; pt states that he is having right sided chest pain / upper abd pain and lower abd tenderness from vomiting

## 2013-06-21 NOTE — Progress Notes (Signed)
Clinical Social Work  CSW received referral in order to complete psychosocial assessment re: substance use. CSW went to room but patient currently meeting with other medical staff. CSW will follow up at later time.  [place], [place] [phone]

## 2013-06-21 NOTE — Progress Notes (Signed)
Subjective: Patient admitted this morning, recurrent hematemesis. See detailed H&P by Dr. Conley Canal. Filed Vitals:   06/21/13 1449  BP: 118/81  Pulse: 76  Temp: 98 F (36.7 C)  Resp: 14    Chest: Clear Bilaterally Heart : S1S2 RRR Abdomen: Soft, nontender Ext : No edema Neuro: Alert, oriented x 3  A/P Hematemesis History of esophagitis GERD Alcohol abuse  We'll get GI consultation for possible EGD    Jesse Hartman [facility] Pager- [phone]

## 2013-06-21 NOTE — H&P (Addendum)
[facility] History and Physical  Jesse Hartman VQM:[id number] DOB: April 14, 1960 DOA: 06/21/2013  Referring physician: Lita Mains PCP: Chesley Noon, MD   Chief Complaint: vomiting blood  HPI: Jesse Hartman is a 53 y.o. male  With a history of Barrett's esophagus and alcohol abuse presents with multiple episodes of vomiting today. Initially he vomited was nonbloody but after multiple episodes, started having hematemesis. In the emergency room, he is hemodynamically stable and has a hemoglobin of 13.5. BUN is normal. He is complaining of abdominal pain and burning chest pain from vomiting. Admits to drinking alcohol today. Denies melena or hematochezia. He has had no vomiting in the emergency room and is requesting something to drink. He had an endoscopy in January 2015 by Dr. Maurene Capes which showed esophagitis. Patient provides little history.   Review of Systems:  Difficult due to patient factors.  Past Medical History  Diagnosis Date  . Hypertension   . Barrett's syndrome   . Arthritis   . Hypokalemia   . Hyponatremia   . Esophagitis   . GERD (gastroesophageal reflux disease)   . DVT (deep venous thrombosis)   . COPD (chronic obstructive pulmonary disease)   . Depression   . Substance abuse    Past Surgical History  Procedure Laterality Date  . Rhinoplasty    . Bunionectomy    . Appendectomy    . Esophagogastroduodenoscopy  08/03/2011    Procedure: ESOPHAGOGASTRODUODENOSCOPY (EGD);  Surgeon: Milus Banister, MD;  Location: [facility];  Service: Endoscopy;  Laterality: N/A;  . Finger surgery      lt middle  . Esophagogastroduodenoscopy N/A 01/24/2013    Procedure: ESOPHAGOGASTRODUODENOSCOPY (EGD);  Surgeon: Lafayette Dragon, MD;  Location[facility]COPY;  Service: Endoscopy;  Laterality: N/A;  . Nasal sinus surgery    . Back surgery      L1 L2   Social History:  reports that he has been smoking Cigarettes.  He has a 7.5 pack-year smoking history. He does not have any  smokeless tobacco history on file. He reports that he drinks about 16.8 ounces of alcohol per week. He reports that he does not use illicit drugs.  No Known Allergies  Family History  Problem Relation Age of Onset  . Diabetes Maternal Grandfather      Prior to Admission medications   Medication Sig Start Date End Date Taking? Authorizing Migel Hannis  albuterol (PROVENTIL HFA;VENTOLIN HFA) 108 (90 BASE) MCG/ACT inhaler Inhale 2 puffs into the lungs every 6 (six) hours as needed for wheezing. 01/26/13  Yes Megan Dort, PA-C  amLODipine-olmesartan (AZOR) 10-40 MG per tablet Take 1 tablet by mouth daily. Lot [id number]  Exp 04/06/2015 09/29/12  Yes Historical Tudor Chandley, MD  budesonide-formoterol (SYMBICORT) 160-4.5 MCG/ACT inhaler Inhale 2 puffs into the lungs 2 (two) times daily.   Yes Historical Teryn Boerema, MD  HYDROcodone-acetaminophen (NORCO) 10-325 MG per tablet Take 1 tablet by mouth every 8 (eight) hours as needed. For pain. 06/19/13  Yes Historical Kerrion Kemppainen, MD  metoprolol succinate (TOPROL-XL) 50 MG 24 hr tablet Take 50 mg by mouth daily. Take with or immediately following a meal.   Yes Historical Haidy Kackley, MD  mometasone (NASONEX) 50 MCG/ACT nasal spray Place 2 sprays into the nose daily as needed. For congestion 03/28/13 03/28/14 Yes Historical Katalia Choma, MD  pantoprazole (PROTONIX) 40 MG tablet Take 1 tablet (40 mg total) by mouth 2 (two) times daily. 01/25/13  Yes Megan Dort, PA-C  tiotropium (SPIRIVA) 18 MCG inhalation capsule Place 18 mcg into inhaler and inhale  daily.   Yes Historical Winola Drum, MD   Physical Exam: Filed Vitals:   06/21/13 0430  BP: 155/86  Pulse: 90  Temp:   Resp: 22   BP 137/87  Pulse 88  Temp(Src) 98.6 F (37 C) (Oral)  Resp 22  Ht 6\' 2"  (1.88 m)  Wt 111.222 kg (245 lb 3.2 oz)  BMI 31.47 kg/m2  SpO2 94%  General Appearance:   asleep. Briefly arousable and falls back asleep. Answers questions with one-word answers, but not reliably.   Head:    Normocephalic,  without obvious abnormality, atraumatic  Eyes:    PERRL, conjunctiva/corneas clear, EOM's intact, fundi    benign, both eyes          Nose:   Nares normal, septum midline, mucosa normal, no drainage   or sinus tenderness  Throat:   moist mucous membranes. No blood noted.   Neck:   Supple, symmetrical, trachea midline, no adenopathy;       thyroid:  No enlargement/tenderness/nodules; no carotid   bruit or JVD  Back:     Symmetric, no curvature, ROM normal, no CVA tenderness  Lungs:     bilateral wheeze. No rhonchi or rales.   Chest wall:    No tenderness or deformity  Heart:    Regular rate and rhythm, S1 and S2 normal, no murmur, rub   or gallop  Abdomen:     Soft, non-tender, bowel sounds active all four quadrants,    no masses, no organomegaly  Genitalia:   deferred  Rectal:   deferred  Extremities:   Extremities normal, atraumatic, no cyanosis or edema  Pulses:   2+ and symmetric all extremities  Skin:   Skin color, texture, turgor normal, no rashes or lesions  Lymph nodes:   Cervical, supraclavicular, and axillary nodes normal  Neurologic:   CNII-XII intact. Normal strength, sensation and reflexes      throughout          Labs on Admission:  Basic Metabolic Panel:  Recent Labs Lab 06/21/13 0214  NA 135*  K 3.6*  CL 88*  CO2 31  GLUCOSE 139*  BUN 14  CREATININE 0.91  CALCIUM 13.2*   Liver Function Tests:  Recent Labs Lab 06/21/13 0214  AST 28  ALT 23  ALKPHOS 72  BILITOT 0.5  PROT 7.8  ALBUMIN 4.0    Recent Labs Lab 06/21/13 0214  LIPASE 43   No results found for this basename: AMMONIA,  in the last 168 hours CBC:  Recent Labs Lab 06/21/13 0214  WBC 12.6*  HGB 13.5  HCT 40.7  MCV 75.4*  PLT 261   Cardiac Enzymes: No results found for this basename: CKTOTAL, CKMB, CKMBINDEX, TROPONINI,  in the last 168 hours  BNP (last 3 results)  Recent Labs  09/24/12 0530 10/05/12 0159  PROBNP 216.7* 74.9   CBG: No results found for this  basename: GLUCAP,  in the last 168 hours  Radiological Exams on Admission: Dg Chest Port 1 View  06/21/2013   CLINICAL DATA:  Chest pain, hypertension.  EXAM: PORTABLE CHEST - 1 VIEW  COMPARISON:  Chest radiograph February 12, 2013  FINDINGS: Cardiomediastinal silhouette is unremarkable for this low inspiratory portable examination with crowded vasculature markings. The lungs are clear without pleural effusions or focal consolidations. Trachea projects midline and there is no pneumothorax. Included soft tissue planes and osseous structures are non-suspicious. Remote left mid clavicle fracture. Multiple EKG lines overlie the patient and may obscure subtle underlying pathology.  IMPRESSION: No acute cardiopulmonary process.   Electronically Signed   By: Elon Alas   On: 06/21/2013 02:41    EKG: Sinus rhythm Probable left atrial enlargement Abnormal R-wave progression, early transition ST elevation, consider inferior injury  Assessment/Plan Principal Problem:  Reported Hematemesis:  No vomiting here. Likely gastritis/esophagitis vs. Mallory weiss tear.  Has been started on protonix drip. Vitals and hgb stable. Will admit. Continue protonix, monitor h/h clear liquids for now. If any further hematemesis, or significant drop in hgb, consult GI. Active Problems:   GERD   Barrett esophagus   COPD (chronic obstructive pulmonary disease): schedule duonebs   Alcohol abuse: CIWA protocol consult social work HTN: hold antihypertensives   Code Status: full Family Communication:  Disposition Plan: home  Time spent: 45 min  [facility] Pager [phone]

## 2013-06-21 NOTE — Consult Note (Signed)
Referring Provider: No ref. provider found Primary Care Physician:  Chesley Noon, MD Primary Gastroenterologist:  Dr. Henrene Pastor  Reason for Consultation:  Hematemesis  HPI: Jesse Hartman is a 53 y.o. male known to [place] for a history of GERD/Barrett's esophagus and ulcerative esophagitis.  Also has history of alcohol abuse; last ETOH was yesterday.  He presented to the ED today with multiple episodes of vomiting blood. This began yesterday and initially he vomited was non-bloody on the first episode, every subsequent episode he started having blood (sometimes more than others).  He says that he vomited 8-12 times.  Last episode was 1 AM today.  In the emergency room, he wass hemodynamically stable and had a hemoglobin of 13.5, which is now down to 12.6 grams. BUN is normal. He is complaining of epigastric abdominal pain and burning chest pain from vomiting.  Denies melena or hematochezia; last BM was Sunday and was normal color.  He is on protonix 40 mg once daily at home, which he takes regularly.  Last EGD was in 01/2013 by Dr. Olevia Perches for complaints of hematemesis.  At that time she had abnormal appearing lower third of the esophagus with deep ulcers, superficial ulcers, and erosions/Grade 3 esophagitis.  Biopsies showed ulcerative esophagitis with findings c/w Barrett's.  Also had moderate duodenitis and biopsies were benign.   Past Medical History  Diagnosis Date  . Hypertension   . Barrett's syndrome   . Arthritis   . Hypokalemia   . Hyponatremia   . Esophagitis   . GERD (gastroesophageal reflux disease)   . DVT (deep venous thrombosis)   . COPD (chronic obstructive pulmonary disease)   . Depression   . Substance abuse     Past Surgical History  Procedure Laterality Date  . Rhinoplasty    . Bunionectomy    . Appendectomy    . Esophagogastroduodenoscopy  08/03/2011    Procedure: ESOPHAGOGASTRODUODENOSCOPY (EGD);  Surgeon: Milus Banister, MD;  Location: [facility];  Service:  Endoscopy;  Laterality: N/A;  . Finger surgery      lt middle  . Esophagogastroduodenoscopy N/A 01/24/2013    Procedure: ESOPHAGOGASTRODUODENOSCOPY (EGD);  Surgeon: Lafayette Dragon, MD;  Location[facility] ENDOSCOPY;  Service: Endoscopy;  Laterality: N/A;  . Nasal sinus surgery    . Back surgery      L1 L2    Prior to Admission medications   Medication Sig Start Date End Date Taking? Authorizing Provider  albuterol (PROVENTIL HFA;VENTOLIN HFA) 108 (90 BASE) MCG/ACT inhaler Inhale 2 puffs into the lungs every 6 (six) hours as needed for wheezing. 01/26/13  Yes Megan Dort, PA-C  amLODipine-olmesartan (AZOR) 10-40 MG per tablet Take 1 tablet by mouth daily. Lot [id number]  Exp 04/06/2015 09/29/12  Yes Historical Provider, MD  budesonide-formoterol (SYMBICORT) 160-4.5 MCG/ACT inhaler Inhale 2 puffs into the lungs 2 (two) times daily.   Yes Historical Provider, MD  HYDROcodone-acetaminophen (NORCO) 10-325 MG per tablet Take 1 tablet by mouth every 8 (eight) hours as needed. For pain. 06/19/13  Yes Historical Provider, MD  metoprolol succinate (TOPROL-XL) 50 MG 24 hr tablet Take 50 mg by mouth daily. Take with or immediately following a meal.   Yes Historical Provider, MD  mometasone (NASONEX) 50 MCG/ACT nasal spray Place 2 sprays into the nose daily as needed. For congestion 03/28/13 03/28/14 Yes Historical Provider, MD  pantoprazole (PROTONIX) 40 MG tablet Take 1 tablet (40 mg total) by mouth 2 (two) times daily. 01/25/13  Yes Megan Dort, PA-C  tiotropium (  SPIRIVA) 18 MCG inhalation capsule Place 18 mcg into inhaler and inhale daily.   Yes Historical Provider, MD    Current Facility-Administered Medications  Medication Dose Route Frequency Provider Last Rate Last Dose  . 0.9 %  sodium chloride infusion   Intravenous STAT Julianne Rice, MD      . 0.9 % NaCl with KCl 40 mEq / L  infusion   Intravenous Continuous Delfina Redwood, MD 125 mL/hr at 06/21/13 0646 125 mL/hr at 06/21/13 0646  . acetaminophen  (TYLENOL) tablet 650 mg  650 mg Oral Q6H PRN Delfina Redwood, MD       Or  . acetaminophen (TYLENOL) suppository 650 mg  650 mg Rectal Q6H PRN Delfina Redwood, MD      . alum & mag hydroxide-simeth (MAALOX/MYLANTA) 200-200-20 MG/5ML suspension 30 mL  30 mL Oral Q6H PRN Delfina Redwood, MD      . budesonide-formoterol (SYMBICORT) 160-4.5 MCG/ACT inhaler 2 puff  2 puff Inhalation BID Delfina Redwood, MD   2 puff at 06/21/13 [id number]  . ipratropium-albuterol (DUONEB) 0.5-2.5 (3) MG/3ML nebulizer solution 3 mL  3 mL Nebulization QID Delfina Redwood, MD   3 mL at 06/21/13 1135  . LORazepam (ATIVAN) injection 0-4 mg  0-4 mg Intravenous Q6H Delfina Redwood, MD   1 mg at 06/21/13 1224   Followed by  . [START ON 06/23/2013] LORazepam (ATIVAN) injection 0-4 mg  0-4 mg Intravenous Q12H Delfina Redwood, MD      . LORazepam (ATIVAN) tablet 1 mg  1 mg Oral Q6H PRN Delfina Redwood, MD       Or  . LORazepam (ATIVAN) injection 1 mg  1 mg Intravenous Q6H PRN Delfina Redwood, MD      . morphine 2 MG/ML injection 2 mg  2 mg Intravenous Q3H PRN Delfina Redwood, MD   2 mg at 06/21/13 1224  . ondansetron (ZOFRAN) injection 4 mg  4 mg Intravenous Q8H PRN Julianne Rice, MD      . ondansetron [facility]) tablet 4 mg  4 mg Oral Q6H PRN Delfina Redwood, MD       Or  . ondansetron [facility]) injection 4 mg  4 mg Intravenous Q6H PRN Delfina Redwood, MD      . pantoprazole (PROTONIX) 80 mg in sodium chloride 0.9 % 250 mL infusion  8 mg/hr Intravenous Continuous Julianne Rice, MD 25 mL/hr at 06/21/13 1319 8 mg/hr at 06/21/13 1319  . thiamine (VITAMIN B-1) tablet 100 mg  100 mg Oral Daily Delfina Redwood, MD   100 mg at 06/21/13 0932   Or  . thiamine (B-1) injection 100 mg  100 mg Intravenous Daily Delfina Redwood, MD      . tiotropium [facility]) inhalation capsule 18 mcg  18 mcg Inhalation Daily Delfina Redwood, MD   18 mcg at 06/21/13 6283    Allergies as of 06/21/2013  . (No Known  Allergies)    Family History  Problem Relation Age of Onset  . Diabetes Maternal Grandfather     History   Social History  . Marital Status: Divorced    Social History Main Topics  . Smoking status: Current Every Day Smoker -- 0.25 packs/day for 30 years    Types: Cigarettes  . Smokeless tobacco: Not on file  . Alcohol Use: 16.8 oz/week    28 Cans of beer per week  . Drug Use: No  . Sexual Activity: No  Review of Systems: Ten point ROS is O/W negative except as mentioned in HPI.  Physical Exam: Vital signs in last 24 hours: Temp:  [98.1 F (36.7 C)-98.8 F (37.1 C)] 98.6 F (37 C) (06/17 1321) Pulse Rate:  [81-97] 85 (06/17 1321) Resp:  [17-22] 17 (06/17 1321) BP: (117-169)/(68-91) 117/74 mmHg (06/17 1321) SpO2:  [89 %-97 %] 96 % (06/17 1321) Weight:  [245 lb 3.2 oz (111.222 kg)] 245 lb 3.2 oz (111.222 kg) (06/17 0554) Last BM Date: 06/19/13 General:  Alert, Well-developed, well-nourished, pleasant and cooperative in NAD Head:  Normocephalic and atraumatic. Eyes:  Sclera clear, no icterus.  Conjunctiva pink. Ears:  Normal auditory acuity. Mouth:  No deformity or lesions.  Voice is very hoarse.   Lungs:  Wheezing and crackles heard B/L (patient and daughter say that this is pretty much his baseline). Heart:  Regular rate and rhythm; no murmurs, clicks, rubs, or gallops. Abdomen:  Soft, non-distended.  BS present. Non-tender.   Rectal:  Deferred  Msk:  Symmetrical without gross deformities. Pulses:  Normal pulses noted. Extremities:  Without clubbing or edema. Neurologic:  Alert and  oriented x4;  grossly normal neurologically. Skin:  Intact without significant lesions or rashes. Psych:  Alert and cooperative. Normal mood and affect.   Lab Results:  Recent Labs  06/21/13 0214 06/21/13 0502  WBC 12.6* 13.4*  HGB 13.5 12.6*  HCT 40.7 37.8*  PLT 261 234   BMET  Recent Labs  06/21/13 0214 06/21/13 0502  NA 135* 138  K 3.6* 4.3  CL 88* 95*  CO2 31  30  GLUCOSE 139* 136*  BUN 14 14  CREATININE 0.91 0.96  CALCIUM 13.2* 12.2*   LFT  Recent Labs  06/21/13 0214  PROT 7.8  ALBUMIN 4.0  AST 28  ALT 23  ALKPHOS 72  BILITOT 0.5   PT/INR  Recent Labs  06/21/13 0214  LABPROT 12.2  INR 0.92   Studies/Results: Dg Chest Port 1 View  06/21/2013   CLINICAL DATA:  Chest pain, hypertension.  EXAM: PORTABLE CHEST - 1 VIEW  COMPARISON:  Chest radiograph February 12, 2013  FINDINGS: Cardiomediastinal silhouette is unremarkable for this low inspiratory portable examination with crowded vasculature markings. The lungs are clear without pleural effusions or focal consolidations. Trachea projects midline and there is no pneumothorax. Included soft tissue planes and osseous structures are non-suspicious. Remote left mid clavicle fracture. Multiple EKG lines overlie the patient and may obscure subtle underlying pathology.  IMPRESSION: No acute cardiopulmonary process.   Electronically Signed   By: Elon Alas   On: 06/21/2013 02:41    IMPRESSION:  45. 53 year old male with several episodes of hematemesis yesterday.  Possible Mallory-Weiss tear, but he also has known history of ulcerative esophagitis for which he has been taking PPI once daily. 2. GERD/hx of severe ulcerative esophagitis/Barrett's 3. ETOH abuse  PLAN: -He is on PPI gtt that we will continue for now. -Add carafate suspension four times daily. -Clear liquids for now. -Plans for EGD per Dr. Carlean Purl.   ZEHR, JESSICA D.  06/21/2013, 1:43 PM  Pager number [phone]   [facility] Attending  I have also seen and assessed the patient and agree with the above note.  I think an EGD is appropriate given recurrent hematemesis in setting of Barrett's esophagus.  Note he also has hypercalcemia - says he was taking a lot of Tums right before admit. If does not resolve could need further w/u - not been a problem  iN PAT.  i have explained he should be abstinent from EtOH though may  not be related to acute issue here certainly poses long term risks w 28+ beers a week.  Gatha Mayer, MD, [facility] [phone] (pager) 06/21/2013 4:08 PM

## 2013-06-21 NOTE — ED Provider Notes (Signed)
CSN: [id number]     Arrival date & time 06/21/13  0203 History   First MD Initiated Contact with Patient 06/21/13 0205     Chief Complaint  Patient presents with  . GI Bleeding     (Consider location/radiation/quality/duration/timing/severity/associated sxs/prior Treatment) HPI Patient has a history of Barrett's esophagus and esophagitis presents with epigastric pain radiating into the chest with multiple episodes of vomiting. He states he was initially vomiting stomach contents and then noticed a red blood in the vomit. This been going on for the past 6 hours. He denies any fevers or chills. He denies any melena. No shortness of breath. Patient denies being on any blood thinners.  Past Medical History  Diagnosis Date  . Hypertension   . Barrett's syndrome   . Arthritis   . Hypokalemia   . Hyponatremia   . Esophagitis   . GERD (gastroesophageal reflux disease)   . DVT (deep venous thrombosis)   . COPD (chronic obstructive pulmonary disease)   . Depression   . Substance abuse    Past Surgical History  Procedure Laterality Date  . Rhinoplasty    . Bunionectomy    . Appendectomy    . Esophagogastroduodenoscopy  08/03/2011    Procedure: ESOPHAGOGASTRODUODENOSCOPY (EGD);  Surgeon: Milus Banister, MD;  Location: [facility];  Service: Endoscopy;  Laterality: N/A;  . Finger surgery      lt middle  . Esophagogastroduodenoscopy N/A 01/24/2013    Procedure: ESOPHAGOGASTRODUODENOSCOPY (EGD);  Surgeon: Lafayette Dragon, MD;  Location: [facility];  Service: Endoscopy;  Laterality: N/A;  . Nasal sinus surgery    . Back surgery      L1 L2   Family History  Problem Relation Age of Onset  . Diabetes Maternal Grandfather    History  Substance Use Topics  . Smoking status: Current Every Day Smoker -- 0.25 packs/day for 30 years    Types: Cigarettes  . Smokeless tobacco: Not on file  . Alcohol Use: 16.8 oz/week    28 Cans of beer per week    Review of Systems  Constitutional:  Negative for fever and chills.  Respiratory: Negative for cough and shortness of breath.   Cardiovascular: Positive for chest pain. Negative for palpitations and leg swelling.  Gastrointestinal: Positive for nausea, vomiting and abdominal pain. Negative for diarrhea, constipation and blood in stool.  Musculoskeletal: Negative for back pain, neck pain and neck stiffness.  Skin: Negative for rash and wound.  Neurological: Negative for dizziness, weakness, light-headedness, numbness and headaches.  All other systems reviewed and are negative.     Allergies  Review of patient's allergies indicates no known allergies.  Home Medications   Prior to Admission medications   Medication Sig Start Date End Date Taking? Authorizing Provider  albuterol (PROVENTIL HFA;VENTOLIN HFA) 108 (90 BASE) MCG/ACT inhaler Inhale 2 puffs into the lungs every 6 (six) hours as needed for wheezing. 01/26/13   Megan Dort, PA-C  amLODipine-olmesartan (AZOR) 10-40 MG per tablet Take 1 tablet by mouth daily. Lot [id number]  Exp 04/06/2015 09/29/12   Historical Provider, MD  budesonide-formoterol (SYMBICORT) 160-4.5 MCG/ACT inhaler Inhale 2 puffs into the lungs 2 (two) times daily.    Historical Provider, MD  budesonide-formoterol (SYMBICORT) 160-4.5 MCG/ACT inhaler Inhale 2 puffs into the lungs 2 (two) times daily. 01/26/13   Megan Dort, PA-C  clonazePAM (KLONOPIN) 1 MG tablet Take 2 mg by mouth 2 (two) times daily as needed for anxiety.    Historical Provider, MD  docusate sodium 100  MG CAPS Take 100 mg by mouth 2 (two) times daily. 01/25/13   Megan Dort, PA-C  folic acid (FOLVITE) 1 MG tablet Take 1 tablet (1 mg total) by mouth daily. 01/25/13   Megan Dort, PA-C  metoprolol succinate (TOPROL-XL) 50 MG 24 hr tablet Take 50 mg by mouth daily. Take with or immediately following a meal.    Historical Provider, MD  oxyCODONE (OXY IR/ROXICODONE) 5 MG immediate release tablet Take 1-3 tablets (5-15 mg total) by mouth every 6 (six)  hours as needed for severe pain (5mg  for mild pain, 10mg  for moderate pain, 15mg  for severe pain). 01/25/13   Megan Dort, PA-C  pantoprazole (PROTONIX) 40 MG tablet Take 1 tablet (40 mg total) by mouth 2 (two) times daily. 01/25/13   Megan Dort, PA-C  polyethylene glycol (MIRALAX / GLYCOLAX) packet Take 17 g by mouth daily as needed. 01/25/13   Megan Dort, PA-C  promethazine (PHENERGAN) 25 MG suppository Place 1 suppository (25 mg total) rectally every 6 (six) hours as needed for nausea or vomiting. 02/12/13   Dot Lanes, MD  thiamine 100 MG tablet Take 1 tablet (100 mg total) by mouth daily. 01/25/13   Megan Dort, PA-C  tiotropium (SPIRIVA) 18 MCG inhalation capsule Place 18 mcg into inhaler and inhale daily.    Historical Provider, MD  tiZANidine (ZANAFLEX) 4 MG tablet Take 1 tablet (4 mg total) by mouth every 8 (eight) hours as needed for muscle spasms. 01/25/13   Megan Dort, PA-C  traMADol (ULTRAM) 50 MG tablet Take 2 tablets (100 mg total) by mouth every 6 (six) hours as needed for moderate pain or severe pain. 01/25/13   Megan Dort, PA-C   BP 169/90  Pulse 95  Temp(Src) 98.1 F (36.7 C) (Oral)  Resp 20  SpO2 96% Physical Exam  Nursing note and vitals reviewed. Constitutional: He is oriented to person, place, and time. He appears well-developed and well-nourished. No distress.  HENT:  Head: Normocephalic and atraumatic.  Mouth/Throat: Oropharynx is clear and moist.  Eyes: EOM are normal. Pupils are equal, round, and reactive to light.  Neck: Normal range of motion. Neck supple.  Cardiovascular: Normal rate and regular rhythm.   Pulmonary/Chest: Effort normal and breath sounds normal. No respiratory distress. He has no wheezes. He has no rales. He exhibits no tenderness.  Abdominal: Soft. Bowel sounds are normal. He exhibits no distension and no mass. There is tenderness (epigastric tenderness to palpation.). There is no rebound and no guarding.  Musculoskeletal: Normal range of motion. He  exhibits no edema and no tenderness.  No calf swelling or tenderness.  Neurological: He is alert and oriented to person, place, and time.  Moves all extremities without deficit. Sensation is grossly intact.  Skin: Skin is warm and dry. No rash noted. No erythema.  Psychiatric: He has a normal mood and affect. His behavior is normal.    ED Course  Procedures (including critical care time) Labs Review Labs Reviewed  CBC  COMPREHENSIVE METABOLIC PANEL  LIPASE, BLOOD  PROTIME-INR  APTT  I-STAT TROPOININ, ED  POC OCCULT BLOOD, ED  TYPE AND SCREEN    Imaging Review No results found.   EKG Interpretation   Date/Time:  Wednesday June 21 2013 02:27:00 EDT Ventricular Rate:  93 PR Interval:  171 QRS Duration: 97 QT Interval:  371 QTC Calculation: 461 R Axis:   15 Text Interpretation:  Sinus rhythm Probable left atrial enlargement  Abnormal R-wave progression, early transition ST elevation, consider  inferior  injury Confirmed by Lita Mains  MD, [place] ([id number]) on 06/21/2013  4:30:34 AM      MDM   Final diagnoses:  None      Patient with no further vomiting/bleeding in the emergency department. Protonix drip started. Vital signs remained stable. Hemoglobin is stable. Discussed with Dr. Conley Canal will admit. Likely will need GI consultation morning.  Julianne Rice, MD 06/21/13 0430

## 2013-06-21 NOTE — Progress Notes (Signed)
INITIAL NUTRITION ASSESSMENT  DOCUMENTATION CODES Per approved criteria  -Obesity Unspecified   INTERVENTION:  Clear liquid diet with diet advancement per MD]  Resource Breeze tid  RD to monitor  NUTRITION DIAGNOSIS: Inadequate oral intake related to altered gi function  as evidenced by clear liquid diet..   Goal: Diet advancement with intake of meals and supplements to meet >90% of estimated needs.  Monitor:  Intake, labs, diet advancement, weight trend  Reason for Assessment: mst  53 y.o. male  Admitting Dx: Hematemesis  ASSESSMENT: Patient known history of GERD/Barrett's esophagus and ulcerative esophagitis as well as alcohol abuse admitted after vomiting blood with possible mallory-weiss tear.  Plans for EGD per Dr. Carlean Purl.  -Sleeping at time of visit. Daughter present but unaware of patient's history.  -diet is clear liquid -weight loss about 5 months ago and has regained 5 lbs.  Height: Ht Readings from Last 1 Encounters:  06/21/13 6\' 2"  (1.88 m)    Weight: Wt Readings from Last 1 Encounters:  06/21/13 245 lb 3.2 oz (111.222 kg)    Ideal Body Weight: 190 lbs  % Ideal Body Weight: 129  Wt Readings from Last 10 Encounters:  06/21/13 245 lb 3.2 oz (111.222 kg)  02/12/13 240 lb (108.863 kg)  01/21/13 251 lb 1.7 oz (113.9 kg)  01/21/13 251 lb 1.7 oz (113.9 kg)  12/20/12 250 lb (113.399 kg)  10/05/12 254 lb (115.214 kg)  09/23/12 259 lb 0.7 oz (117.5 kg)  08/03/11 233 lb 14.5 oz (106.1 kg)  08/03/11 233 lb 14.5 oz (106.1 kg)  06/11/09 230 lb 6.1 oz (104.5 kg)    Usual Body Weight: 250  % Usual Body Weight: 98  BMI:  Body mass index is 31.47 kg/(m^2).  Estimated Nutritional Needs: Kcal: 2100-2300 Protein: 130-140 gm  Fluid: 2.5L  Skin: intact  Diet Order: Clear Liquid  EDUCATION NEEDS: -No education needs identified at this time   Intake/Output Summary (Last 24 hours) at 06/21/13 1510 Last data filed at 06/21/13 0900  Gross per 24  hour  Intake      0 ml  Output    600 ml  Net   -600 ml    Labs:   Recent Labs Lab 06/21/13 0214 06/21/13 0502  NA 135* 138  K 3.6* 4.3  CL 88* 95*  CO2 31 30  BUN 14 14  CREATININE 0.91 0.96  CALCIUM 13.2* 12.2*  GLUCOSE 139* 136*    CBG (last 3)  No results found for this basename: GLUCAP,  in the last 72 hours  Scheduled Meds: . sodium chloride   Intravenous STAT  . budesonide-formoterol  2 puff Inhalation BID  . ipratropium-albuterol  3 mL Nebulization QID  . LORazepam  0-4 mg Intravenous Q6H   Followed by  . [START ON 06/23/2013] LORazepam  0-4 mg Intravenous Q12H  . sucralfate  1 g Oral 4 times per day  . thiamine  100 mg Oral Daily   Or  . thiamine  100 mg Intravenous Daily  . tiotropium  18 mcg Inhalation Daily    Continuous Infusions: . 0.9 % NaCl with KCl 40 mEq / L 125 mL/hr (06/21/13 0646)  . pantoprozole (PROTONIX) infusion 8 mg/hr (06/21/13 1319)    Past Medical History  Diagnosis Date  . Hypertension   . Barrett's syndrome   . Arthritis   . Hypokalemia   . Hyponatremia   . Esophagitis   . GERD (gastroesophageal reflux disease)   . DVT (deep venous thrombosis)   .  COPD (chronic obstructive pulmonary disease)   . Depression   . Substance abuse     Past Surgical History  Procedure Laterality Date  . Rhinoplasty    . Bunionectomy    . Appendectomy    . Esophagogastroduodenoscopy  08/03/2011    Procedure: ESOPHAGOGASTRODUODENOSCOPY (EGD);  Surgeon: Milus Banister, MD;  Location: [facility];  Service: Endoscopy;  Laterality: N/A;  . Finger surgery      lt middle  . Esophagogastroduodenoscopy N/A 01/24/2013    Procedure: ESOPHAGOGASTRODUODENOSCOPY (EGD);  Surgeon: Lafayette Dragon, MD;  Location: [facility] ENDOSCOPY;  Service: Endoscopy;  Laterality: N/A;  . Nasal sinus surgery    . Back surgery      L1 L2    Antonieta Iba, RD, LDN Clinical Inpatient Dietitian Pager:  [phone] Weekend and after hours pager:  [phone]

## 2013-06-22 ENCOUNTER — Encounter (HOSPITAL_COMMUNITY)
Admission: EM | Disposition: A | Discharge status: Home / Self Care | Payer: Self-pay | Source: Home / Self Care | Attending: Family Medicine

## 2013-06-22 ENCOUNTER — Encounter (HOSPITAL_COMMUNITY): Payer: Self-pay

## 2013-06-22 DIAGNOSIS — R110 Nausea: Secondary | ICD-10-CM

## 2013-06-22 DIAGNOSIS — K208 Other esophagitis without bleeding: Secondary | ICD-10-CM

## 2013-06-22 DIAGNOSIS — K449 Diaphragmatic hernia without obstruction or gangrene: Secondary | ICD-10-CM

## 2013-06-22 DIAGNOSIS — K2210 Ulcer of esophagus without bleeding: Secondary | ICD-10-CM

## 2013-06-22 DIAGNOSIS — K920 Hematemesis: Secondary | ICD-10-CM

## 2013-06-22 HISTORY — DX: Diaphragmatic hernia without obstruction or gangrene: K44.9

## 2013-06-22 HISTORY — DX: Ulcer of esophagus without bleeding: K22.10

## 2013-06-22 HISTORY — PX: ESOPHAGOGASTRODUODENOSCOPY: SHX5428

## 2013-06-22 LAB — COMPREHENSIVE METABOLIC PANEL
ALBUMIN: 3 g/dL — AB (ref 3.5–5.2)
ALT: 15 U/L (ref 0–53)
AST: 18 U/L (ref 0–37)
Alkaline Phosphatase: 50 U/L (ref 39–117)
BUN: 10 mg/dL (ref 6–23)
CALCIUM: 9.3 mg/dL (ref 8.4–10.5)
CO2: 28 meq/L (ref 19–32)
CREATININE: 1.01 mg/dL (ref 0.50–1.35)
Chloride: 101 mEq/L (ref 96–112)
GFR calc Af Amer: >90 mL/min (ref >90)
GFR, EST NON AFRICAN AMERICAN: 84 mL/min — AB (ref >90)
Glucose, Bld: 90 mg/dL (ref 70–99)
Potassium: 4.3 mEq/L (ref 3.7–5.3)
Sodium: 140 mEq/L (ref 137–147)
TOTAL PROTEIN: 5.8 g/dL — AB (ref 6.0–8.3)
Total Bilirubin: 0.3 mg/dL (ref 0.3–1.2)

## 2013-06-22 LAB — MRSA PCR SCREENING: MRSA by PCR: POSITIVE — AB

## 2013-06-22 LAB — CBC
HCT: 34.7 % — ABNORMAL LOW (ref 39.0–52.0)
Hemoglobin: 10.8 g/dL — ABNORMAL LOW (ref 13.0–17.0)
MCH: 24.5 pg — AB (ref 26.0–34.0)
MCHC: 31.1 g/dL (ref 30.0–36.0)
MCV: 78.9 fL (ref 78.0–100.0)
PLATELETS: 182 10*3/uL (ref 150–400)
RBC: 4.4 MIL/uL (ref 4.22–5.81)
RDW: 17.5 % — AB (ref 11.5–15.5)
WBC: 7.1 10*3/uL (ref 4.0–10.5)

## 2013-06-22 LAB — HEMOGLOBIN AND HEMATOCRIT, BLOOD
HEMATOCRIT: 32.4 % — AB (ref 39.0–52.0)
Hemoglobin: 10.4 g/dL — ABNORMAL LOW (ref 13.0–17.0)

## 2013-06-22 LAB — GLUCOSE, CAPILLARY: Glucose-Capillary: 97 mg/dL (ref 70–99)

## 2013-06-22 SURGERY — EGD (ESOPHAGOGASTRODUODENOSCOPY)
Anesthesia: Moderate Sedation

## 2013-06-22 MED ORDER — SUCRALFATE 1 GM/10ML PO SUSP: 1.0000 g | Freq: Four times a day (QID) | ORAL | Status: DC

## 2013-06-22 MED ORDER — FENTANYL CITRATE 0.05 MG/ML IJ SOLN
INTRAMUSCULAR | Status: AC
  Filled 2013-06-22: qty 2

## 2013-06-22 MED ORDER — MIDAZOLAM HCL 10 MG/2ML IJ SOLN
INTRAMUSCULAR | Status: DC | PRN
Start: 2013-06-22 — End: 2013-06-22
  Administered 2013-06-22 (×5): 2 mg via INTRAVENOUS

## 2013-06-22 MED ORDER — OXYCODONE HCL 5 MG PO TABS
5.0000 mg | ORAL_TABLET | ORAL | Status: DC | PRN
  Administered 2013-06-22 – 2013-06-23 (×3): 5 mg via ORAL
  Filled 2013-06-22 (×3): qty 1

## 2013-06-22 MED ORDER — FENTANYL CITRATE 0.05 MG/ML IJ SOLN
INTRAMUSCULAR | Status: DC | PRN
  Administered 2013-06-22 (×4): 25 ug via INTRAVENOUS

## 2013-06-22 MED ORDER — MIDAZOLAM HCL 10 MG/2ML IJ SOLN
INTRAMUSCULAR | Status: AC
  Filled 2013-06-22: qty 2

## 2013-06-22 MED ORDER — DIPHENHYDRAMINE HCL 50 MG/ML IJ SOLN
INTRAMUSCULAR | Status: DC | PRN
  Administered 2013-06-22: 25 mg via INTRAVENOUS

## 2013-06-22 MED ORDER — GI COCKTAIL ~~LOC~~
30.0000 mL | Freq: Three times a day (TID) | ORAL | Status: DC | PRN
  Filled 2013-06-22: qty 30

## 2013-06-22 MED ORDER — BUTAMBEN-TETRACAINE-BENZOCAINE 2-2-14 % EX AERO
INHALATION_SPRAY | CUTANEOUS | Status: DC | PRN
  Administered 2013-06-22: 2 via TOPICAL

## 2013-06-22 MED ORDER — DIPHENHYDRAMINE HCL 50 MG/ML IJ SOLN
INTRAMUSCULAR | Status: AC
  Filled 2013-06-22: qty 1
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
## 2013-06-22 NOTE — Progress Notes (Addendum)
[facility] PROGRESS NOTE  Jesse Hartman JJO:[id number] DOB: December 06, 1960 DOA: 06/21/2013 PCP: Chesley Noon, MD  Assessment/Plan:  1. Hematemesis- patient admitted with hematemesis, has h/o ulcerative esophagitis/ Barrett's. GI has seen the patient and plan is for EGD today. Keep npo, continue protonix infusion.  2. H/o Ulcerative esophagitis- Continue with carafate. 3. Alcohol abuse- Continue with CIWA protocol. 4. Hypercalcemia-  ? Cause,resolved, after IV Hydration.Ca down from 12.2-- 9.3  Code Status: Full code Family Communication: *No family at bedside Disposition Plan: *Home when stable  Consultants:  GI  Procedures:  None  Antibiotics:  None  HPI/Subjective: Patient seen and examined, admitted with hematemesis. Plan is for EGD today.  Objective: Filed Vitals:   06/22/13 1448  BP: 151/83  Pulse: 87  Temp: 98.7 F (37.1 C)  Resp: 10    Intake/Output Summary (Last 24 hours) at 06/22/13 1512 Last data filed at 06/22/13 0540  Gross per 24 hour  Intake 3162.5 ml  Output    750 ml  Net 2412.5 ml   Filed Weights   06/21/13 0554  Weight: 111.222 kg (245 lb 3.2 oz)    Exam:  Physical Exam: Head: Normocephalic, atraumatic.        Lungs: Normal respiratory effort. B/L Clear to auscultation, no crackles or wheezes.  Heart: Regular RR. S1 and S2 normal  Abdomen: BS normoactive. Soft, Nondistended, non-tender.  Extremities: No pretibial edema, no erythema   Data Reviewed: Basic Metabolic Panel:  Recent Labs Lab 06/21/13 0214 06/21/13 0502 06/22/13 0550  NA 135* 138 140  K 3.6* 4.3 4.3  CL 88* 95* 101  CO2 31 30 28   GLUCOSE 139* 136* 90  BUN 14 14 10   CREATININE 0.91 0.96 1.01  CALCIUM 13.2* 12.2* 9.3   Liver Function Tests:  Recent Labs Lab 06/21/13 0214 06/22/13 0550  AST 28 18  ALT 23 15  ALKPHOS 72 50  BILITOT 0.5 0.3  PROT 7.8 5.8*  ALBUMIN 4.0 3.0*    Recent Labs Lab 06/21/13 0214  LIPASE 43   No results found for  this basename: AMMONIA,  in the last 168 hours CBC:  Recent Labs Lab 06/21/13 0214 06/21/13 0502 06/21/13 1811 06/22/13 0550  WBC 12.6* 13.4*  --  7.1  HGB 13.5 12.6* 11.0* 10.8*  HCT 40.7 37.8* 35.5* 34.7*  MCV 75.4* 75.1*  --  78.9  PLT 261 234  --  182   Cardiac Enzymes: No results found for this basename: CKTOTAL, CKMB, CKMBINDEX, TROPONINI,  in the last 168 hours BNP (last 3 results)  Recent Labs  09/24/12 0530 10/05/12 0159  PROBNP 216.7* 74.9   CBG:  Recent Labs Lab 06/22/13 1337  GLUCAP 97    Recent Results (from the past 240 hour(s))  MRSA PCR SCREENING     Status: Abnormal   Collection Time    06/22/13 12:28 AM      Result Value Ref Range Status   MRSA by PCR POSITIVE (*) NEGATIVE Final   Comment:            The GeneXpert MRSA Assay (FDA     approved for NASAL specimens     only), is one component of a     comprehensive MRSA colonization     surveillance program. It is not     intended to diagnose MRSA     infection nor to guide or     monitor treatment for     MRSA infections.     RESULT CALLED TO,  READ BACK BY AND VERIFIED WITHGlynn Octave RN AT 1517 06.18.15 BY TIBBITTS,K     Studies: Dg Chest Port 1 View  06/21/2013   CLINICAL DATA:  Chest pain, hypertension.  EXAM: PORTABLE CHEST - 1 VIEW  COMPARISON:  Chest radiograph February 12, 2013  FINDINGS: Cardiomediastinal silhouette is unremarkable for this low inspiratory portable examination with crowded vasculature markings. The lungs are clear without pleural effusions or focal consolidations. Trachea projects midline and there is no pneumothorax. Included soft tissue planes and osseous structures are non-suspicious. Remote left mid clavicle fracture. Multiple EKG lines overlie the patient and may obscure subtle underlying pathology.  IMPRESSION: No acute cardiopulmonary process.   Electronically Signed   By: Elon Alas   On: 06/21/2013 02:41    Scheduled Meds: . budesonide-formoterol  2  puff Inhalation BID  . feeding supplement (RESOURCE BREEZE)  1 Container Oral TID BM  . ipratropium-albuterol  3 mL Nebulization QID  . LORazepam  0-4 mg Intravenous Q6H   Followed by  . [START ON 06/23/2013] LORazepam  0-4 mg Intravenous Q12H  . sucralfate  1 g Oral 4 times per day  . thiamine  100 mg Oral Daily   Or  . thiamine  100 mg Intravenous Daily  . tiotropium  18 mcg Inhalation Daily   Continuous Infusions: . sodium chloride    . 0.9 % NaCl with KCl 40 mEq / L 125 mL/hr (06/22/13 1044)  . pantoprozole (PROTONIX) infusion 8 mg/hr (06/22/13 0140)    Principal Problem:   Hematemesis Active Problems:   GERD   Barrett esophagus   COPD (chronic obstructive pulmonary disease)   Alcohol abuse   Hypercalcemia    Time spent: 25 min    [facility] Pager [phone]. If 7PM-7AM, please contact night-coverage at [email or web address], password [facility] 06/22/2013, 3:12 PM  LOS: 1 day

## 2013-06-22 NOTE — Op Note (Signed)
[facility] [place], [id number]   ENDOSCOPY PROCEDURE REPORT  PATIENT: Jesse, Hartman  MR#: #[id number] BIRTHDATE: 07-May-1960 , 31  yrs. old GENDER: Male ENDOSCOPIST: Gatha Mayer, MD, [facility] PROCEDURE DATE:  06/22/2013 PROCEDURE:  EGD w/ biopsy ASA CLASS:     Class III INDICATIONS:  Hematemesis.   hx Barrett's, ulcerative esophagitis. MEDICATIONS: Fentanyl 100 mcg IV, Versed 10 mg IV, and Benadryl 25 mg IV TOPICAL ANESTHETIC: Cetacaine Spray  DESCRIPTION OF PROCEDURE: After the risks benefits and alternatives of the procedure were thoroughly explained, informed consent was obtained.  The [place] [id number] endoscope was introduced through the mouth and advanced to the second portion of the duodenum. Without limitations.  The instrument was slowly withdrawn as the mucosa was fully examined.     ESOPHAGUS: Multiple large linear and clean-based ulcers with surrounding edema and heaped up edges were found in the entire esophagus.  Biopsies were taken at edge of the ulcers, at the center of the ulcers and around the ulcers.   Distal area showed heaped up mucosa, ? Barrett's. The severe inflammation distorts views.  STOMACH: A medium sized hiatal hernia was suspected  The remainder of the upper endoscopy exam was otherwise normal. Retroflexed views revealed as above.     The scope was then withdrawn from the patient and the procedure completed.  COMPLICATIONS: There were no complications. ENDOSCOPIC IMPRESSION: 1.   Multiple large ulcers were found in the entire esophagus 2.   Medium sized hiatal hernia suspected 3.   The remainder of the upper endoscopy exam was otherwise normal  RECOMMENDATIONS: Upper GI Series  tomorrow Stay on PPI - bid Check gastrin fasting  eSigned:  Gatha Mayer, MD, [facility] 06/22/2013 4:28 PM

## 2013-06-23 ENCOUNTER — Encounter (HOSPITAL_COMMUNITY): Payer: Self-pay | Admitting: Internal Medicine

## 2013-06-23 ENCOUNTER — Inpatient Hospital Stay (HOSPITAL_COMMUNITY): Payer: Medicaid Other

## 2013-06-23 DIAGNOSIS — K2100 Gastro-esophageal reflux disease with esophagitis, without bleeding: Secondary | ICD-10-CM

## 2013-06-23 DIAGNOSIS — I10 Essential (primary) hypertension: Secondary | ICD-10-CM

## 2013-06-23 DIAGNOSIS — D62 Acute posthemorrhagic anemia: Secondary | ICD-10-CM

## 2013-06-23 LAB — HEMOGLOBIN AND HEMATOCRIT, BLOOD
HCT: 34.1 % — ABNORMAL LOW (ref 39.0–52.0)
HEMOGLOBIN: 10.9 g/dL — AB (ref 13.0–17.0)

## 2013-06-23 MED ORDER — METOPROLOL SUCCINATE ER 50 MG PO TB24
50.0000 mg | ORAL_TABLET | Freq: Every day | ORAL | Status: DC
  Filled 2013-06-23: qty 1

## 2013-06-23 MED ORDER — PANTOPRAZOLE SODIUM 40 MG PO TBEC: 40.0000 mg | DELAYED_RELEASE_TABLET | Freq: Two times a day (BID) | ORAL | Status: DC

## 2013-06-23 MED ORDER — SUCRALFATE 1 GM/10ML PO SUSP: 1.0000 g | Freq: Four times a day (QID) | ORAL | Status: DC

## 2013-06-23 MED ORDER — PANTOPRAZOLE SODIUM 40 MG IV SOLR
40.0000 mg | Freq: Two times a day (BID) | INTRAVENOUS | Status: DC
  Administered 2013-06-23: 40 mg via INTRAVENOUS
  Filled 2013-06-23 (×2): qty 40

## 2013-06-23 MED ORDER — CHLORHEXIDINE GLUCONATE CLOTH 2 % EX PADS
6.0000 | MEDICATED_PAD | Freq: Every day | CUTANEOUS | Status: DC
Start: 2013-06-24 — End: 2013-06-23

## 2013-06-23 NOTE — Progress Notes (Signed)
Clinical Social Work  CSW met with patient at bedside. Patient and dtr at bedside. Patient reports he is anxious to eat but other than that is feeling better. Patient reports that he is feeling emotionally well and is happy that dtr is here to visit. Patient states hospital staff has taken good care of him but it is nice to have family. Patient thanked CSW for visit but reports no needs at this time and just wants to talk to RN about diet.  CSW will continue to follow.  [place], [place] [phone]

## 2013-06-23 NOTE — Discharge Summary (Addendum)
Physician Discharge Summary  Jesse Hartman:[id number] DOB: 01-Jul-1960 DOA: 06/21/2013  PCP: Chesley Noon, MD  Admit date: 06/21/2013 Discharge date: 06/23/2013  Time spent: 50* minutes  Recommendations for Outpatient Follow-up:  1. *Follow up PCP in 2 weeks 2. Follow up GI in 2 weeks  Discharge Diagnoses:  Principal Problem:   Hematemesis Active Problems:   GERD   Barrett esophagus   COPD (chronic obstructive pulmonary disease)   Alcohol abuse   Hypercalcemia   Ulcerative esophagitis   Discharge Condition: Stable  Diet recommendation: Low salt diet  Filed Weights   06/21/13 0554  Weight: 111.222 kg (245 lb 3.2 oz)    History of present illness:  53 y.o. male  With a history of Barrett's esophagus and alcohol abuse presents with multiple episodes of vomiting today. Initially he vomited was nonbloody but after multiple episodes, started having hematemesis. In the emergency room, he is hemodynamically stable and has a hemoglobin of 13.5. BUN is normal. He is complaining of abdominal pain and burning chest pain from vomiting. Admits to drinking alcohol today. Denies melena or hematochezia. He has had no vomiting in the emergency room and is requesting something to drink. He had an endoscopy in January 2015 by Dr. Maurene Capes which showed esophagitis. Patient provides little history   Hospital Course:  1. Hematemesis- Resolved, he underwent EGD which showed multiple esophageal ulcers. Patient has been started on Carafate 1 g 4 times a day along with protonix 40 mg twice a day. Patient underwent upper GI barium swallow today, which did not show hiatal hernia. Patient will be discharged on the current regimen of Carafate and Protonix. GI we'll follow the patient is outpatient. We'll call him with the biopsy results. 2. Anemia-hemoglobin has been stable renal 10.9 with hematocrit 34.1. No need of blood transfusion. 3. H/o Ulcerative esophagitis- Continue with carafate. 4. Alcohol  abuse- patient was started on CIWA protocol, counseled  to stop drinking. 5. Hypertension- continue Toprol XL, azor as prescribed as outpatient. Will give 1 dose of Toprol XL 50 mg for discharge as patient's blood pressure continues to be elevated. 6. Hypercalcemia- ? Cause,resolved, after IV Hydration.Ca down from 12.2-- 9.3     Procedures:  Upper GI endoscopy  Upper GI barium swallow  Consultations:  Gastroenterology  Discharge Exam: Filed Vitals:   06/23/13 0634  BP: 179/101  Pulse: 67  Temp: 97.8 F (36.6 C)  Resp: 17    General: Appear in no acute distress* Cardiovascular: *S1-S2 regular Respiratory: Clear bilaterally  Discharge Instructions You were cared for by a hospitalist during your hospital stay. If you have any questions about your discharge medications or the care you received while you were in the hospital after you are discharged, you can call the unit and asked to speak with the hospitalist on call if the hospitalist that took care of you is not available. Once you are discharged, your primary care physician will handle any further medical issues. Please note that NO REFILLS for any discharge medications will be authorized once you are discharged, as it is imperative that you return to your primary care physician (or establish a relationship with a primary care physician if you do not have one) for your aftercare needs so that they can reassess your need for medications and monitor your lab values.  Discharge Instructions   Diet - low sodium heart healthy    Complete by:  As directed      Increase activity slowly    Complete by:  As directed             Medication List         albuterol 108 (90 BASE) MCG/ACT inhaler  Commonly known as:  PROVENTIL HFA;VENTOLIN HFA  Inhale 2 puffs into the lungs every 6 (six) hours as needed for wheezing.     AZOR 10-40 MG per tablet  Generic drug:  amLODipine-olmesartan  Take 1 tablet by mouth daily. Lot [id number]   Exp 04/06/2015     budesonide-formoterol 160-4.5 MCG/ACT inhaler  Commonly known as:  SYMBICORT  Inhale 2 puffs into the lungs 2 (two) times daily.     metoprolol succinate 50 MG 24 hr tablet  Commonly known as:  TOPROL-XL  Take 50 mg by mouth daily. Take with or immediately following a meal.     NASONEX 50 MCG/ACT nasal spray  Generic drug:  mometasone  Place 2 sprays into the nose daily as needed. For congestion     NORCO 10-325 MG per tablet  Generic drug:  HYDROcodone-acetaminophen  Take 1 tablet by mouth every 8 (eight) hours as needed. For pain.     pantoprazole 40 MG tablet  Commonly known as:  PROTONIX  Take 1 tablet (40 mg total) by mouth 2 (two) times daily.     sucralfate 1 GM/10ML suspension  Commonly known as:  CARAFATE  Take 10 mLs (1 g total) by mouth every 6 (six) hours.     tiotropium 18 MCG inhalation capsule  Commonly known as:  SPIRIVA  Place 18 mcg into inhaler and inhale daily.       No Known Allergies     Follow-up Information   Follow up with BADGER,MICHAEL C, MD In 2 weeks.   Specialty:  Family Medicine   Contact information:   [place] [address] [phone]       Follow up with Silvano Rusk, MD In 2 weeks.   Specialty:  Gastroenterology   Contact information:   [address] [place] [address] [phone]        The results of significant diagnostics from this hospitalization (including imaging, microbiology, ancillary and laboratory) are listed below for reference.    Significant Diagnostic Studies: Dg Chest Port 1 View  06/21/2013   CLINICAL DATA:  Chest pain, hypertension.  EXAM: PORTABLE CHEST - 1 VIEW  COMPARISON:  Chest radiograph February 12, 2013  FINDINGS: Cardiomediastinal silhouette is unremarkable for this low inspiratory portable examination with crowded vasculature markings. The lungs are clear without pleural effusions or focal consolidations. Trachea projects midline and there is no  pneumothorax. Included soft tissue planes and osseous structures are non-suspicious. Remote left mid clavicle fracture. Multiple EKG lines overlie the patient and may obscure subtle underlying pathology.  IMPRESSION: No acute cardiopulmonary process.   Electronically Signed   By: Elon Alas   On: 06/21/2013 02:41   Dg Duanne Limerick W/high Density W/kub  06/23/2013   CLINICAL DATA:  Barrett's esophagus  EXAM: UPPER GI SERIES WITHOUT KUB  TECHNIQUE: Routine upper GI series was performed with thin and high-density barium.  FLUOROSCOPY TIME:  1 min 13 seconds  COMPARISON:  None.  FINDINGS: Examination of the esophagus demonstrated normal esophageal motility. There is mucosal irregularity involving the distal esophagus measuring approximately 15 cm in length concerning for ulceration. No esophageal stricture, diverticula, or mass lesion. No evidence of hiatal hernia. Severe gastroesophageal reflux.  Examination of the stomach demonstrated normal rugal folds and areae gastricae. The gastric mucosa appeared unremarkable without  evidence of ulceration, scarring, or mass lesion. Gastric motility and emptying was normal. Fluoroscopic examination of the duodenum demonstrates normal motility and morphology without evidence of ulceration or mass lesion.  IMPRESSION: 1. Mucosal irregularity in the distal esophagus consistent with esophagitis and ulceration. 2. Severe gastroesophageal reflux.   Electronically Signed   By: Kathreen Devoid   On: 06/23/2013 10:39    Microbiology: Recent Results (from the past 240 hour(s))  MRSA PCR SCREENING     Status: Abnormal   Collection Time    06/22/13 12:28 AM      Result Value Ref Range Status   MRSA by PCR POSITIVE (*) NEGATIVE Final   Comment:            The GeneXpert MRSA Assay (FDA     approved for NASAL specimens     only), is one component of a     comprehensive MRSA colonization     surveillance program. It is not     intended to diagnose MRSA     infection nor to guide  or     monitor treatment for     MRSA infections.     RESULT CALLED TO, READ BACK BY AND VERIFIED WITH:     [facility] RN AT 8250 06.18.15 BY TIBBITTS,K     Labs: Basic Metabolic Panel:  Recent Labs Lab 06/21/13 0214 06/21/13 0502 06/22/13 0550  NA 135* 138 140  K 3.6* 4.3 4.3  CL 88* 95* 101  CO2 31 30 28   GLUCOSE 139* 136* 90  BUN 14 14 10   CREATININE 0.91 0.96 1.01  CALCIUM 13.2* 12.2* 9.3   Liver Function Tests:  Recent Labs Lab 06/21/13 0214 06/22/13 0550  AST 28 18  ALT 23 15  ALKPHOS 72 50  BILITOT 0.5 0.3  PROT 7.8 5.8*  ALBUMIN 4.0 3.0*    Recent Labs Lab 06/21/13 0214  LIPASE 43   No results found for this basename: AMMONIA,  in the last 168 hours CBC:  Recent Labs Lab 06/21/13 0214 06/21/13 0502 06/21/13 1811 06/22/13 0550 06/22/13 1843 06/23/13 0543  WBC 12.6* 13.4*  --  7.1  --   --   HGB 13.5 12.6* 11.0* 10.8* 10.4* 10.9*  HCT 40.7 37.8* 35.5* 34.7* 32.4* 34.1*  MCV 75.4* 75.1*  --  78.9  --   --   PLT 261 234  --  182  --   --    Cardiac Enzymes: No results found for this basename: CKTOTAL, CKMB, CKMBINDEX, TROPONINI,  in the last 168 hours BNP: BNP (last 3 results)  Recent Labs  09/24/12 0530 10/05/12 0159  PROBNP 216.7* 74.9   CBG:  Recent Labs Lab 06/22/13 1337  GLUCAP 97       Signed:  Tavin Vernet S  [facility] 06/23/2013, 2:40 PM

## 2013-06-23 NOTE — Progress Notes (Signed)
     [facility] Progress Note  Subjective:  Feels ok.  Getting ready to go down for UGI study.  Objective:  Vital signs in last 24 hours: Temp:  [97.6 F (36.4 C)-98.8 F (37.1 C)] 97.8 F (36.6 C) (06/19 0634) Pulse Rate:  [67-87] 67 (06/19 0634) Resp:  [10-26] 17 (06/19 0634) BP: (147-183)/(78-109) 179/101 mmHg (06/19 0634) SpO2:  [95 %-99 %] 96 % (06/19 0739) Last BM Date: 06/19/13 General:  Alert, Well-developed, in NAD Heart:  Regular rate and rhythm; no murmurs Pulm:  Wheezing/crackles heard.  B/L. Abdomen:  Soft, non-distended. Normal bowel sounds.  Non-tender.  Extremities:  Without edema. Neurologic:  Alert and  oriented x4;  grossly normal neurologically. Psych:  Alert and cooperative. Normal mood and affect.  Assessment / Plan: 1. Multiple large esophageal ulcers and ? medium sized hiatal hernia seen on EGD 6/18. 2.  Hematemesis/UGIB secondary to #1:  Resolved. 3.  Acute blood loss anemia secondary to above:  Hgb now stable. 4.  ETOH abuse:  Needs abstinence.  *Await results of UGI study and fasting gastrin level. *Discontinue PPI gtt.  Needs pantoprazole 40 mg BID, and continue carafate suspension four times daily for now.      LOS: 2 days   ZEHR, JESSICA D.  06/23/2013, 9:01 AM  Pager number [phone]   [place] Attending  I have also seen and assessed the patient and agree with the above note. He had a Ba swallow - no hiatal hernia  He can go home on current Tx We will call pathology results from office and arrange f/u Not sure why he has such terrible esophagitis If he fails more aggressive medical tx then consider fundoplication He needs to stop drinking even though that is probably not related to esophagitis  Gatha Mayer, MD, [facility] [phone] (pager) 06/23/2013 1:53 PM

## 2013-06-23 NOTE — Progress Notes (Signed)
Pt discharged home with daughter. Pt verbalized understanding d/c instructions, follow-up appts, and medications. Pt denied any complaints at the time of discharge. Pt left via wheelchair.

## 2013-06-24 LAB — GASTRIN: Gastrin: 86 pg/mL (ref <=100)

## 2013-06-27 NOTE — Progress Notes (Signed)
Quick Note:  Lease let him know biopsies show the pre-cancerous Barrett's esophagus - no cancer at this point - ha has been a patient of Dr. Blanch Media  1) Stay on medications as prescribed 2) needs appointment with Dr. Henrene Pastor or APP in July and should have a repeat EGD in 2-3 months after being on the bid PPI and Carafate  ______

## 2013-08-01 ENCOUNTER — Encounter: Payer: Self-pay | Admitting: Internal Medicine

## 2013-08-01 ENCOUNTER — Ambulatory Visit (INDEPENDENT_AMBULATORY_CARE_PROVIDER_SITE_OTHER): Payer: Medicaid Other | Admitting: Internal Medicine

## 2013-08-01 VITALS — BP 132/80 | HR 74 | Ht 74.0 in | Wt 244.0 lb

## 2013-08-01 DIAGNOSIS — K2100 Gastro-esophageal reflux disease with esophagitis, without bleeding: Secondary | ICD-10-CM

## 2013-08-01 DIAGNOSIS — K922 Gastrointestinal hemorrhage, unspecified: Secondary | ICD-10-CM

## 2013-08-01 DIAGNOSIS — K2270 Barrett's esophagus without dysplasia: Secondary | ICD-10-CM

## 2013-08-01 DIAGNOSIS — R197 Diarrhea, unspecified: Secondary | ICD-10-CM

## 2013-08-01 MED ORDER — PANTOPRAZOLE SODIUM 40 MG PO TBEC: 40.0000 mg | DELAYED_RELEASE_TABLET | Freq: Two times a day (BID) | ORAL | Status: DC

## 2013-08-01 MED ORDER — PEG-KCL-NACL-NASULF-NA ASC-C 100 G PO SOLR: 1.0000 | Freq: Once | ORAL | Status: DC

## 2013-08-01 NOTE — Progress Notes (Signed)
HISTORY OF PRESENT ILLNESS:  Jesse Hartman is a 53 y.o. male  with multiple medical problems including hypertension, asthma, chronic alcohol and tobacco abuse, anxiety, and severe GERD complicated by severe erosive esophagitis. I have not seen the patient since June 2011 at which time he was post hospital followup. At that time, he underwent upper endoscopy and was found to have a 10 cm segment of nondysplastic Barrett's. He was to continue on twice a day PPI. He was to followup in the office but failed to do so. As well have followup upper endoscopy in 1 year. He did not. Finally, talk about colonoscopy. The patient's states that lack of insurance prevented followup. Since that time he has been in the hospital for chronic alcoholism and hematemesis. Severe erosive esophagitis in January as well as June 2015. He was an alcohol rehabilitation earlier this year with improvement in drinking habits, but not complete absence. He has been seeing his PCP for blood pressure issues. Recent visit with his dentist found tooth infection for which she is on amoxicillin. He reports a couple days of significant diarrhea without other symptoms. He was told to followup in this office after history since hospital stay. He was discharged home on pantoprazole 40 mg twice a day, but is only taking pantoprazole 40 mg daily. No active GERD symptoms or recurrent nausea with vomiting. Bowel habits are regular.  REVIEW OF SYSTEMS:  All non-GI ROS negative except for fatigue and insomnia  Past Medical History  Diagnosis Date  . Hypertension   . Barrett's syndrome   . Arthritis   . Hypokalemia   . Hyponatremia   . Esophagitis   . GERD (gastroesophageal reflux disease)   . DVT (deep venous thrombosis)   . COPD (chronic obstructive pulmonary disease)   . Depression   . Substance abuse   . Hiatal Hernia 06/22/2013  . 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 Hypercalcemia     Past Surgical History  Procedure Laterality Date  .  Rhinoplasty    . Bunionectomy    . Appendectomy    . Esophagogastroduodenoscopy  08/03/2011    Procedure: ESOPHAGOGASTRODUODENOSCOPY (EGD);  Surgeon: Milus Banister, MD;  Location: [facility];  Service: Endoscopy;  Laterality: N/A;  . Finger surgery      lt middle  . Esophagogastroduodenoscopy N/A 01/24/2013    Procedure: ESOPHAGOGASTRODUODENOSCOPY (EGD);  Surgeon: Lafayette Dragon, MD;  Location: [facility] END[facility]COPY;  Service: Endoscopy;  Laterality: N/A;  . Nasal sinus surgery    . Back surgery      L1 L2  . Esophagogastroduodenoscopy N/A 06/22/2013    Procedure: ESOPHAGOGASTRODUODENOSCOPY (EGD);  Surgeon: Gatha Mayer, MD;  Location: [facility];  Service: Endoscopy;  Laterality: N/A;    Social History Jesse Hartman  reports that he has been smoking Cigarettes.  He has a 7.5 pack-year smoking history. He has never used smokeless tobacco. He reports that he drinks about 16.8 ounces of alcohol per week. He reports that he does not use illicit drugs.  family history includes Diabetes in his maternal grandfather.  No Known Allergies     PHYSICAL EXAMINATION: Vital signs: BP 132/80  Pulse 74  Ht 6\' 2"  (1.88 m)  Wt 244 lb (110.678 kg)  BMI 31.31 kg/m2  Constitutional: generally well-appearing, no acute distress Psychiatric: alert and oriented x3, cooperative, but anxious Eyes: extraocular movements intact, anicteric, conjunctiva pink Mouth: oral pharynx moist, no lesions Neck: supple no lymphadenopathy Cardiovascular: heart regular rate and rhythm, no murmur Lungs: clear  to auscultation bilaterally Abdomen: soft, obese, nontender, nondistended, no obvious ascites, no peritoneal signs, normal bowel sounds, no organomegaly Rectal: Deferred until colonoscopy Extremities: no lower extremity edema bilaterally Skin: Abrasions on knees, otherwise no lesions on visible extremities Neuro: No focal deficits. No asterixis.    ASSESSMENT:  #1. GERD complicated by erosive esophagitis. History  of nondysplastic Barrett's #2. Hematemesis secondary to erosive esophagitis. #3. Diarrhea. Rule out C. difficile #4. Colon cancer screening. Baseline risk. Appropriate candidate without contraindication #5. Multiple medical problems   PLAN:  #1. Pantoprazole 40 mg twice a day. Prescription changed #2. Reflux precautions #3. Avoid alcohol #4. Upper endoscopy with surveillance biopsies.The nature of the procedure, as well as the risks, benefits, and alternatives were carefully and thoroughly reviewed with the patient. Ample time for discussion and questions allowed. The patient understood, was satisfied, and agreed to proceed. #5. Screening colonoscopy. Movi prep prescribed.The nature of the procedure, as well as the risks, benefits, and alternatives were carefully and thoroughly reviewed with the patient. Ample time for discussion and questions allowed. The patient understood, was satisfied, and agreed to proceed. Procedure to be performed after Labor Day, in September #6. Check stool for Clostridium difficile by PCR

## 2013-08-01 NOTE — Patient Instructions (Addendum)
Your physician has requested that you go to the basement for the following lab work before leaving today:C. Diff by PCR.  We have sent the following medications to your pharmacy for you to pick up at your convenience: Protonix 40 mg one tablet by mouth twice daily.  You have been scheduled for an endoscopy and colonoscopy. Please follow the written instructions given to you at your visit today. Please pick up your prep at the pharmacy within the next 1-3 days. If you use inhalers (even only as needed), please bring them with you on the day of your procedure. Your physician has requested that you go to www.startemmi.com and enter the access code given to you at your visit today. This web site gives a general overview about your procedure. However, you should still follow specific instructions given to you by our office regarding your preparation for the procedure.  cc: Anastasia Pall, MD

## 2013-08-03 ENCOUNTER — Other Ambulatory Visit: Payer: Medicaid Other

## 2013-08-03 DIAGNOSIS — R197 Diarrhea, unspecified: Secondary | ICD-10-CM

## 2013-08-04 LAB — CLOSTRIDIUM DIFFICILE BY PCR: CDIFFPCR: NOT DETECTED

## 2013-09-18 ENCOUNTER — Emergency Department (HOSPITAL_COMMUNITY)
Admission: EM | Admit: 2013-09-18 | Discharge: 2013-09-18 | Disposition: A | Discharge status: Home / Self Care | Payer: Medicaid Other | Attending: Emergency Medicine | Admitting: Emergency Medicine

## 2013-09-18 ENCOUNTER — Encounter (HOSPITAL_COMMUNITY): Payer: Self-pay | Admitting: Emergency Medicine

## 2013-09-18 ENCOUNTER — Telehealth: Payer: Self-pay | Admitting: Internal Medicine

## 2013-09-18 DIAGNOSIS — Z8669 Personal history of other diseases of the nervous system and sense organs: Secondary | ICD-10-CM | POA: Diagnosis not present

## 2013-09-18 DIAGNOSIS — R109 Unspecified abdominal pain: Secondary | ICD-10-CM | POA: Insufficient documentation

## 2013-09-18 DIAGNOSIS — F329 Major depressive disorder, single episode, unspecified: Secondary | ICD-10-CM | POA: Insufficient documentation

## 2013-09-18 DIAGNOSIS — Z79899 Other long term (current) drug therapy: Secondary | ICD-10-CM | POA: Insufficient documentation

## 2013-09-18 DIAGNOSIS — Z8739 Personal history of other diseases of the musculoskeletal system and connective tissue: Secondary | ICD-10-CM | POA: Diagnosis not present

## 2013-09-18 DIAGNOSIS — J4489 Other specified chronic obstructive pulmonary disease: Secondary | ICD-10-CM | POA: Insufficient documentation

## 2013-09-18 DIAGNOSIS — J449 Chronic obstructive pulmonary disease, unspecified: Secondary | ICD-10-CM | POA: Insufficient documentation

## 2013-09-18 DIAGNOSIS — R197 Diarrhea, unspecified: Secondary | ICD-10-CM | POA: Diagnosis not present

## 2013-09-18 DIAGNOSIS — Z8639 Personal history of other endocrine, nutritional and metabolic disease: Secondary | ICD-10-CM | POA: Insufficient documentation

## 2013-09-18 DIAGNOSIS — F3289 Other specified depressive episodes: Secondary | ICD-10-CM | POA: Insufficient documentation

## 2013-09-18 DIAGNOSIS — R112 Nausea with vomiting, unspecified: Secondary | ICD-10-CM | POA: Insufficient documentation

## 2013-09-18 DIAGNOSIS — F411 Generalized anxiety disorder: Secondary | ICD-10-CM | POA: Diagnosis not present

## 2013-09-18 DIAGNOSIS — I10 Essential (primary) hypertension: Secondary | ICD-10-CM | POA: Insufficient documentation

## 2013-09-18 DIAGNOSIS — Z86718 Personal history of other venous thrombosis and embolism: Secondary | ICD-10-CM | POA: Insufficient documentation

## 2013-09-18 DIAGNOSIS — K219 Gastro-esophageal reflux disease without esophagitis: Secondary | ICD-10-CM | POA: Insufficient documentation

## 2013-09-18 DIAGNOSIS — Z792 Long term (current) use of antibiotics: Secondary | ICD-10-CM | POA: Insufficient documentation

## 2013-09-18 DIAGNOSIS — Z862 Personal history of diseases of the blood and blood-forming organs and certain disorders involving the immune mechanism: Secondary | ICD-10-CM | POA: Diagnosis not present

## 2013-09-18 DIAGNOSIS — F17200 Nicotine dependence, unspecified, uncomplicated: Secondary | ICD-10-CM | POA: Diagnosis not present

## 2013-09-18 LAB — COMPREHENSIVE METABOLIC PANEL
ALK PHOS: 82 U/L (ref 39–117)
ALT: 30 U/L (ref 0–53)
AST: 32 U/L (ref 0–37)
Albumin: 4 g/dL (ref 3.5–5.2)
Anion gap: 15 (ref 5–15)
BUN: 8 mg/dL (ref 6–23)
CO2: 22 mEq/L (ref 19–32)
Calcium: 9.6 mg/dL (ref 8.4–10.5)
Chloride: 96 mEq/L (ref 96–112)
Creatinine, Ser: 0.7 mg/dL (ref 0.50–1.35)
GLUCOSE: 80 mg/dL (ref 70–99)
POTASSIUM: 4.5 meq/L (ref 3.7–5.3)
SODIUM: 133 meq/L — AB (ref 137–147)
Total Bilirubin: 0.3 mg/dL (ref 0.3–1.2)
Total Protein: 7.9 g/dL (ref 6.0–8.3)

## 2013-09-18 LAB — CBC
HCT: 39.9 % (ref 39.0–52.0)
Hemoglobin: 13.6 g/dL (ref 13.0–17.0)
MCH: 26.2 pg (ref 26.0–34.0)
MCHC: 34.1 g/dL (ref 30.0–36.0)
MCV: 76.7 fL — ABNORMAL LOW (ref 78.0–100.0)
Platelets: 301 10*3/uL (ref 150–400)
RBC: 5.2 MIL/uL (ref 4.22–5.81)
RDW: 16.9 % — ABNORMAL HIGH (ref 11.5–15.5)
WBC: 6.6 10*3/uL (ref 4.0–10.5)

## 2013-09-18 MED ORDER — HYDROCODONE-ACETAMINOPHEN 5-325 MG PO TABS
2.0000 | ORAL_TABLET | Freq: Once | ORAL | Status: AC
  Administered 2013-09-18: 2 via ORAL
  Filled 2013-09-18: qty 2

## 2013-09-18 MED ORDER — ONDANSETRON 4 MG PO TBDP: ORAL_TABLET | ORAL | Status: DC

## 2013-09-18 MED ORDER — SODIUM CHLORIDE 0.9 % IV BOLUS (SEPSIS)
1000.0000 mL | Freq: Once | INTRAVENOUS | Status: AC
  Administered 2013-09-18: 1000 mL via INTRAVENOUS

## 2013-09-18 NOTE — ED Notes (Signed)
Bed: [facility] Expected date:  Expected time:  Means of arrival:  Comments: EMS-N/V

## 2013-09-18 NOTE — ED Notes (Addendum)
C/o diarrhea since the weekend, emesis started today.  States home environment has noxious smell due to sewage.  Due for colonoscopy tomorrow, has not started colon prep. States emesis was dark, has had esophageal bleeding related to cancer in the past.

## 2013-09-18 NOTE — Telephone Encounter (Signed)
Interesting story. If he doesn't show, no charge. If he is certain that he is not going to show, he should let us know now. Thanks

## 2013-09-18 NOTE — Discharge Instructions (Signed)
Nausea and Vomiting °Nausea is a sick feeling that often comes before throwing up (vomiting). Vomiting is a reflex where stomach contents come out of your mouth. Vomiting can cause severe loss of body fluids (dehydration). Children and elderly adults can become dehydrated quickly, especially if they also have diarrhea. Nausea and vomiting are symptoms of a condition or disease. It is important to find the cause of your symptoms. °CAUSES  °· Direct irritation of the stomach lining. This irritation can result from increased acid production (gastroesophageal reflux disease), infection, food poisoning, taking certain medicines (such as nonsteroidal anti-inflammatory drugs), alcohol use, or tobacco use. °· Signals from the brain. These signals could be caused by a headache, heat exposure, an inner ear disturbance, increased pressure in the brain from injury, infection, a tumor, or a concussion, pain, emotional stimulus, or metabolic problems. °· An obstruction in the gastrointestinal tract (bowel obstruction). °· Illnesses such as diabetes, hepatitis, gallbladder problems, appendicitis, kidney problems, cancer, sepsis, atypical symptoms of a heart attack, or eating disorders. °· Medical treatments such as chemotherapy and radiation. °· Receiving medicine that makes you sleep (general anesthetic) during surgery. °DIAGNOSIS °Your caregiver may ask for tests to be done if the problems do not improve after a few days. Tests may also be done if symptoms are severe or if the reason for the nausea and vomiting is not clear. Tests may include: °· Urine tests. °· Blood tests. °· Stool tests. °· Cultures (to look for evidence of infection). °· X-rays or other imaging studies. °Test results can help your caregiver make decisions about treatment or the need for additional tests. °TREATMENT °You need to stay well hydrated. Drink frequently but in small amounts. You may wish to drink water, sports drinks, clear broth, or eat frozen  ice pops or gelatin dessert to help stay hydrated. When you eat, eating slowly may help prevent nausea. There are also some antinausea medicines that may help prevent nausea. °HOME CARE INSTRUCTIONS  °· Take all medicine as directed by your caregiver. °· If you do not have an appetite, do not force yourself to eat. However, you must continue to drink fluids. °· If you have an appetite, eat a normal diet unless your caregiver tells you differently. °¨ Eat a variety of complex carbohydrates (rice, wheat, potatoes, bread), lean meats, yogurt, fruits, and vegetables. °¨ Avoid high-fat foods because they are more difficult to digest. °· Drink enough water and fluids to keep your urine clear or pale yellow. °· If you are dehydrated, ask your caregiver for specific rehydration instructions. Signs of dehydration may include: °¨ Severe thirst. °¨ Dry lips and mouth. °¨ Dizziness. °¨ Dark urine. °¨ Decreasing urine frequency and amount. °¨ Confusion. °¨ Rapid breathing or pulse. °SEEK IMMEDIATE MEDICAL CARE IF:  °· You have blood or brown flecks (like coffee grounds) in your vomit. °· You have black or bloody stools. °· You have a severe headache or stiff neck. °· You are confused. °· You have severe abdominal pain. °· You have chest pain or trouble breathing. °· You do not urinate at least once every 8 hours. °· You develop cold or clammy skin. °· You continue to vomit for longer than 24 to 48 hours. °· You have a fever. °MAKE SURE YOU:  °· Understand these instructions. °· Will watch your condition. °· Will get help right away if you are not doing well or get worse. °Document Released: 12/22/2004 Document Revised: 03/16/2011 Document Reviewed: 05/21/2010 °Exit[employer]® Patient Information ©2015 [employer]. This information is not intended   to replace advice given to you by your health care provider. Make sure you discuss any questions you have with your health care  provider.    Esophagogastroduodenoscopy Esophagogastroduodenoscopy (EGD) is a procedure to examine the lining of the esophagus, stomach, and first part of the small intestine (duodenum). A long, flexible, lighted tube with a camera attached (endoscope) is inserted down the throat to view these organs. This procedure is done to detect problems or abnormalities, such as inflammation, bleeding, ulcers, or growths, in order to treat them. The procedure lasts about 5-20 minutes. It is usually an outpatient procedure, but it may need to be performed in emergency cases in the hospital. LET YOUR CAREGIVER KNOW ABOUT:   Allergies to food or medicine.  All medicines you are taking, including vitamins, herbs, eyedrops, and over-the-counter medicines and creams.  Use of steroids (by mouth or creams).  Previous problems you or members of your family have had with the use of anesthetics.  Any blood disorders you have.  Previous surgeries you have had.  Other health problems you have.  Possibility of pregnancy, if this applies. RISKS AND COMPLICATIONS  Generally, EGD is a safe procedure. However, as with any procedure, complications can occur. Possible complications include:  Infection.  Bleeding.  Tearing (perforation) of the esophagus, stomach, or duodenum.  Difficulty breathing or not being able to breath.  Excessive sweating.  Spasms of the larynx.  Slowed heartbeat.  Low blood pressure. BEFORE THE PROCEDURE  Do not eat or drink anything for 6-8 hours before the procedure or as directed by your caregiver.  Ask your caregiver about changing or stopping your regular medicines.  If you wear dentures, be prepared to remove them before the procedure.  Arrange for someone to drive you home after the procedure. PROCEDURE   A vein will be accessed to give medicines and fluids. A medicine to relax you (sedative) and a pain reliever will be given through that access into the vein.  A  numbing medicine (local anesthetic) may be sprayed on your throat for comfort and to stop you from gagging or coughing.  A mouth guard may be placed in your mouth to protect your teeth and to keep you from biting on the endoscope.  You will be asked to lie on your left side.  The endoscope is inserted down your throat and into the esophagus, stomach, and duodenum.  Air is put through the endoscope to allow your caregiver to view the lining of your esophagus clearly.  The esophagus, stomach, and duodenum is then examined. During the exam, your caregiver may:  Remove tissue to be examined under a microscope (biopsy) for inflammation, infection, or other medical problems.  Remove growths.  Remove objects (foreign bodies) that are stuck.  Treat any bleeding with medicines or other devices that stop tissues from bleeding (hot cautery, clipping devices).  Widen (dilate) or stretch narrowed areas of the esophagus and stomach.  The endoscope will then be withdrawn. AFTER THE PROCEDURE  You will be taken to a recovery area to be monitored. You will be able to go home once you are stable and alert.  Do not eat or drink anything until the local anesthetic and numbing medicines have worn off. You may choke.  It is normal to feel bloated, have pain with swallowing, or have a sore throat for a short time. This will wear off.  Your caregiver should be able to discuss his or her findings with you. It will take longer to  discuss the test results if any biopsies were taken. Document Released: 04/24/2004 Document Revised: 05/08/2013 Document Reviewed: 11/25/2011 [facility] Patient Information 2015 [place], [place]. This information is not intended to replace advice given to you by your health care provider. Make sure you discuss any questions you have with your health care provider.

## 2013-09-18 NOTE — ED Provider Notes (Signed)
CSN: [id number]     Arrival date & time 09/18/13  1329 History   First MD Initiated Contact with Patient 09/18/13 1403     Chief Complaint  Patient presents with  . Nausea     (Consider location/radiation/quality/duration/timing/severity/associated sxs/prior Treatment) HPI 53 year old male presents with acute nausea and vomiting. He states that he's had sewage problems in his apartment for quite some time and recently has been unable to use the bathroom because his toilet won't flush. He is supposed to be doing a bowel prep for an endoscopy and colonoscopy tomorrow. He's been unable to start this because he can't use the toilet. He started noticing fumes from the sewage from his sink and toilet causing him to have nausea and vomiting. He's had some diarrhea but denies blood or melena. States that now is out of that house his nausea is significantly improved. He was also given Zofran by EMS. Feels significantly improved at this time.  Past Medical History  Diagnosis Date  . Hypertension   . Barrett's syndrome   . Arthritis   . Hypokalemia   . Hyponatremia   . Esophagitis   . GERD (gastroesophageal reflux disease)   . DVT (deep venous thrombosis)   . COPD (chronic obstructive pulmonary disease)   . Depression   . Substance abuse   . Hiatal Hernia 06/22/2013  . 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 Hypercalcemia    Past Surgical History  Procedure Laterality Date  . Rhinoplasty    . Bunionectomy    . Appendectomy    . Esophagogastroduodenoscopy  08/03/2011    Procedure: ESOPHAGOGASTRODUODENOSCOPY (EGD);  Surgeon: Milus Banister, MD;  Location: [facility];  Service: Endoscopy;  Laterality: N/A;  . Finger surgery      lt middle  . Esophagogastroduodenoscopy N/A 01/24/2013    Procedure: ESOPHAGOGASTRODUODENOSCOPY (EGD);  Surgeon: Lafayette Dragon, MD;  Location: [facility];  Service: Endoscopy;  Laterality: N/A;  . Nasal sinus surgery    . Back surgery      L1 L2  .  Esophagogastroduodenoscopy N/A 06/22/2013    Procedure: ESOPHAGOGASTRODUODENOSCOPY (EGD);  Surgeon: Gatha Mayer, MD;  Location: [facility];  Service: Endoscopy;  Laterality: N/A;   Family History  Problem Relation Age of Onset  . Diabetes Maternal Grandfather    History  Substance Use Topics  . Smoking status: Current Every Day Smoker -- 0.25 packs/day for 30 years    Types: Cigarettes  . Smokeless tobacco: Never Used  . Alcohol Use: 16.8 oz/week    28 Cans of beer per week    Review of Systems  Constitutional: Negative for fever.  Gastrointestinal: Positive for nausea, vomiting, abdominal pain and diarrhea. Negative for blood in stool.  All other systems reviewed and are negative.     Allergies  Review of patient's allergies indicates no known allergies.  Home Medications   Prior to Admission medications   Medication Sig Start Date End Date Taking? Authorizing Provider  albuterol (PROVENTIL HFA;VENTOLIN HFA) 108 (90 BASE) MCG/ACT inhaler Inhale 2 puffs into the lungs every 6 (six) hours as needed for wheezing. 01/26/13   Megan N Dort, PA-C  amLODipine-olmesartan (AZOR) 10-40 MG per tablet Take 1 tablet by mouth daily. Lot [id number]  Exp 04/06/2015 09/29/12   Historical Provider, MD  amoxicillin (AMOXIL) 500 MG capsule Take 500 mg by mouth 3 (three) times daily.    Historical Provider, MD  budesonide-formoterol (SYMBICORT) 160-4.5 MCG/ACT inhaler Inhale 2 puffs into the lungs 2 (two) times daily.  Historical Provider, MD  metoprolol succinate (TOPROL-XL) 50 MG 24 hr tablet Take 50 mg by mouth daily. Take with or immediately following a meal.    Historical Provider, MD  pantoprazole (PROTONIX) 40 MG tablet Take 1 tablet (40 mg total) by mouth 2 (two) times daily. 08/01/13   Irene Shipper, MD  peg 3350 powder (MOVIPREP) 100 G SOLR Take 1 kit (200 g total) by mouth once. 08/01/13   Irene Shipper, MD  sucralfate (CARAFATE) 1 GM/10ML suspension Take 10 mLs (1 g total) by mouth every 6  (six) hours. 06/23/13   Oswald Hillock, MD  tiotropium (SPIRIVA) 18 MCG inhalation capsule Place 18 mcg into inhaler and inhale daily.    Historical Provider, MD   BP 157/94  Pulse 92  Ht $R'6\' 2"'uN$  (1.88 m)  Wt 230 lb (104.327 kg)  BMI 29.52 kg/m2 Physical Exam  Nursing note and vitals reviewed. Constitutional: He is oriented to person, place, and time. He appears well-developed and well-nourished. No distress.  HENT:  Head: Normocephalic and atraumatic.  Right Ear: External ear normal.  Left Ear: External ear normal.  Nose: Nose normal.  Eyes: Right eye exhibits no discharge. Left eye exhibits no discharge.  Neck: Neck supple.  Cardiovascular: Normal rate, regular rhythm, normal heart sounds and intact distal pulses.   Pulmonary/Chest: Effort normal and breath sounds normal.  Abdominal: Soft. He exhibits no distension. There is no tenderness.  Musculoskeletal: He exhibits no edema.  Neurological: He is alert and oriented to person, place, and time.  Skin: Skin is warm and dry.    ED Course  Procedures (including critical care time) Labs Review Labs Reviewed  CBC - Abnormal; Notable for the following:    MCV 76.7 (*)    RDW 16.9 (*)    All other components within normal limits  COMPREHENSIVE METABOLIC PANEL - Abnormal; Notable for the following:    Sodium 133 (*)    All other components within normal limits    Imaging Review No results found.   EKG Interpretation None      MDM   Final diagnoses:  Nausea and vomiting in adult    While in the ED patient states is chronic abdominal pain is flaring, he thinks is from this multiple episodes of vomiting. He's not having any melena or bloody stools. He has normal hemoglobin. He has a benign abdominal exam. Given the symptoms resolved after moving from the source of feel he is stable for discharge. He states he is are to call the landlord and a half ago working on his plate right now and should be done by the time he gets home.  He will keep his appointment with GI tomorrow and only get the endoscopy.    Ephraim Hamburger, MD 09/18/13 [phone]

## 2013-09-18 NOTE — Telephone Encounter (Signed)
Called pt back to see if he was going to cancel appt, pt states he is at [facility] but they are releasing him, pt wants to keep EGD but will need to reschedule colonoscopy for another time,pt understands that he is to have nothing to eat after midnight and only clear liquids until 11 am on the day of his procedure 09/19/13. Reviewed instructions with pt and pt verbalizes understanding. Advised Dr Henrene Pastor and he is okay with keeping EGD for 09/19/13 and rescheduling colon, appts have been changed in the system to reflect this-adm

## 2013-09-18 NOTE — Telephone Encounter (Signed)
Pt is schedule for colonoscopy/EGD on 09/19/13. Pt states his apt sewer is back up and is unable to do prep for colon because he has no toilette, pt also states that the fumes from the backed up sewer is making him sick, he has been vomiting and does not feel well, pt states he is calling 911 as soon as we hang up because he is going to the hospital, pt states he does not know if he will be able to keep appt but as of right now wants to cancel because he can not prep. Pt is concerned about being charged, pls advise-adm

## 2013-09-18 NOTE — ED Notes (Addendum)
Pt asking for pain medication. MD made aware, MD to come assess.  Pt appears to be very anxious, he is fidgeting and very talkative when RN goes into the room. Pt expresses concern about missing endoscopy tomorrow due to diarrhea and emesis. Pt sts he doesn't need to have the colonoscopy but he definitely needs to have the endoscopy due to cancerous cells previously found in esophagus.

## 2013-09-19 ENCOUNTER — Ambulatory Visit (AMBULATORY_SURGERY_CENTER): Payer: Medicaid Other | Admitting: Internal Medicine

## 2013-09-19 ENCOUNTER — Encounter: Payer: Medicaid Other | Admitting: Internal Medicine

## 2013-09-19 ENCOUNTER — Encounter: Payer: Self-pay | Admitting: Internal Medicine

## 2013-09-19 VITALS — BP 124/77 | HR 65 | Temp 98.3°F | Resp 26 | Ht 74.0 in | Wt 237.0 lb

## 2013-09-19 DIAGNOSIS — K2100 Gastro-esophageal reflux disease with esophagitis, without bleeding: Secondary | ICD-10-CM

## 2013-09-19 DIAGNOSIS — K2270 Barrett's esophagus without dysplasia: Secondary | ICD-10-CM

## 2013-09-19 MED ORDER — SODIUM CHLORIDE 0.9 % IV SOLN: 500.0000 mL | INTRAVENOUS | Status: DC

## 2013-09-19 NOTE — Progress Notes (Signed)
Called to room to assist during endoscopic procedure.  Patient ID and intended procedure confirmed with present staff. Received instructions for my participation in the procedure from the performing physician.  

## 2013-09-19 NOTE — Patient Instructions (Signed)
YOU HAD AN ENDOSCOPIC PROCEDURE TODAY AT THE [facility]: Refer to the procedure report that was given to you for any specific questions about what was found during the examination.  If the procedure report does not answer your questions, please call your gastroenterologist to clarify.  If you requested that your care partner not be given the details of your procedure findings, then the procedure report has been included in a sealed envelope for you to review at your convenience later.  YOU SHOULD EXPECT: Some feelings of bloating in the abdomen. Passage of more gas than usual.  Walking can help get rid of the air that was put into your GI tract during the procedure and reduce the bloating. If you had a lower endoscopy (such as a colonoscopy or flexible sigmoidoscopy) you may notice spotting of blood in your stool or on the toilet paper. If you underwent a bowel prep for your procedure, then you may not have a normal bowel movement for a few days.  DIET: Your first meal following the procedure should be a light meal and then it is ok to progress to your normal diet.  A half-sandwich or bowl of soup is an example of a good first meal.  Heavy or fried foods are harder to digest and may make you feel nauseous or bloated.  Likewise meals heavy in dairy and vegetables can cause extra gas to form and this can also increase the bloating.  Drink plenty of fluids but you should avoid alcoholic beverages for 24 hours.  ACTIVITY: Your care partner should take you home directly after the procedure.  You should plan to take it easy, moving slowly for the rest of the day.  You can resume normal activity the day after the procedure however you should NOT DRIVE or use heavy machinery for 24 hours (because of the sedation medicines used during the test).    SYMPTOMS TO REPORT IMMEDIATELY: A gastroenterologist can be reached at any hour.  During normal business hours, 8:30 AM to 5:00 PM Monday through Friday,  call [phone].  After hours and on weekends, please call the GI answering service at [phone] who will take a message and have the physician on call contact you.     Following upper endoscopy (EGD)  Vomiting of blood or coffee ground material  New chest pain or pain under the shoulder blades  Painful or persistently difficult swallowing  New shortness of breath  Fever of 100F or higher  Black, tarry-looking stools  FOLLOW UP: If any biopsies were taken you will be contacted by phone or by letter within the next 1-3 weeks.  Call your gastroenterologist if you have not heard about the biopsies in 3 weeks.  Our staff will call the home number listed on your records the next business day following your procedure to check on you and address any questions or concerns that you may have at that time regarding the information given to you following your procedure. This is a courtesy call and so if there is no answer at the home number and we have not heard from you through the emergency physician on call, we will assume that you have returned to your regular daily activities without incident.  SIGNATURES/CONFIDENTIALITY: You and/or your care partner have signed paperwork which will be entered into your electronic medical record.  These signatures attest to the fact that that the information above on your After Visit Summary has been reviewed and is understood.    Full responsibility of the confidentiality of this discharge information lies with you and/or your care-partner.  GERD and Barrett's esophagus information given.  Continue pantoprazole 40 mg. Twice daily indefinitely.  STOP smoking and consuming alcohol.  Dr. Henrene Pastor will let you know after pathology reports about repeating EGD.  Call office to reschedule colonoscopy.

## 2013-09-19 NOTE — Op Note (Signed)
[place]  [address] [place], [id number]   ENDOSCOPY PROCEDURE REPORT  PATIENT: Torri, Langston  MR#: #[id number] BIRTHDATE: 1960/09/22 , 18  yrs. old GENDER: Male ENDOSCOPIST: Eustace Quail, MD REFERRED BY:  .  Self / Office PROCEDURE DATE:  09/19/2013 PROCEDURE:  EGD w/ biopsy ASA CLASS:     Class III INDICATIONS:  history of Barrett's esophagus.  last EGD June 2015 in hospital. Severe ulcerative esophagitis. Now on twice a day PPI for several months MEDICATIONS: MAC sedation, administered by CRNA and propofol (Diprivan) 370mg  IV TOPICAL ANESTHETIC: none  DESCRIPTION OF PROCEDURE: After the risks benefits and alternatives of the procedure were thoroughly explained, informed consent was obtained.  The LB [id number] endoscope was introduced through the mouth and advanced to the second portion of the duodenum. Without limitations.  The instrument was slowly withdrawn as the mucosa was fully examined.   EXAM:The esophagus revealed distal Barrett's esophagus (C8, M8) with multiple squamous islands.  The mucosa.  Slightly inflamed with no obvious mass lesions, focal ulcers, or nodules.  Four-quadrant biopsies taken every 1-2 cm.  The stomach revealed a hiatal hernia but was otherwise normal.  The duodenum was normal.  Retroflexed views revealed a hiatal hernia.     The scope was then withdrawn from the patient and the procedure completed.  COMPLICATIONS: There were no complications. ENDOSCOPIC IMPRESSION: 1. GERD, history of erosive esophagitis and Barrett's 2. Barrett's esophagus status post biopsies  RECOMMENDATIONS: 1.  Continue pantoprazole 40 mg twice daily indefinitely 2. Stop smoking 3. Stop consuming alcohol 4.  REPEAT SURVEILLANCE EGD IN 3 YEARS, if biopsies do not show dysplasia or atypia 5. Patient to contact the office and reschedule his previously scheduled colonoscopy  REPEAT EXAM:  eSigned:  Eustace Quail, MD 09/19/2013  2:25 PM   [id number] Melford Aase, MD and The Patient

## 2013-09-19 NOTE — Progress Notes (Signed)
Report to PACU, RN, vss, BBS= Clear.  

## 2013-09-20 ENCOUNTER — Telehealth: Payer: Self-pay | Admitting: *Deleted

## 2013-09-20 NOTE — Telephone Encounter (Signed)
  Follow up Call-  Call back number 09/19/2013  Post procedure Call Back phone  # [phone]  Permission to leave phone message Yes     Patient questions:  Do you have a fever, pain , or abdominal swelling? No. Pain Score  0 *  Have you tolerated food without any problems? Yes.    Have you been able to return to your normal activities? Yes.    Do you have any questions about your discharge instructions: Diet   No. Medications  No. Follow up visit  No.  Do you have questions or concerns about your Care? No.  Actions: * If pain score is 4 or above: No action needed, pain <4.

## 2013-09-26 ENCOUNTER — Encounter: Payer: Self-pay | Admitting: Internal Medicine

## 2013-10-11 ENCOUNTER — Emergency Department (HOSPITAL_COMMUNITY)
Admission: EM | Admit: 2013-10-11 | Discharge: 2013-10-11 | Disposition: A | Discharge status: Home / Self Care | Payer: Medicaid Other | Attending: Emergency Medicine | Admitting: Emergency Medicine

## 2013-10-11 ENCOUNTER — Emergency Department (HOSPITAL_COMMUNITY): Payer: Medicaid Other

## 2013-10-11 ENCOUNTER — Encounter (HOSPITAL_COMMUNITY): Payer: Self-pay | Admitting: Emergency Medicine

## 2013-10-11 DIAGNOSIS — M1990 Unspecified osteoarthritis, unspecified site: Secondary | ICD-10-CM | POA: Insufficient documentation

## 2013-10-11 DIAGNOSIS — J449 Chronic obstructive pulmonary disease, unspecified: Secondary | ICD-10-CM | POA: Insufficient documentation

## 2013-10-11 DIAGNOSIS — Y9289 Other specified places as the place of occurrence of the external cause: Secondary | ICD-10-CM | POA: Diagnosis not present

## 2013-10-11 DIAGNOSIS — W109XXA Fall (on) (from) unspecified stairs and steps, initial encounter: Secondary | ICD-10-CM | POA: Diagnosis not present

## 2013-10-11 DIAGNOSIS — M545 Low back pain, unspecified: Secondary | ICD-10-CM

## 2013-10-11 DIAGNOSIS — Z79899 Other long term (current) drug therapy: Secondary | ICD-10-CM | POA: Insufficient documentation

## 2013-10-11 DIAGNOSIS — Z8679 Personal history of other diseases of the circulatory system: Secondary | ICD-10-CM | POA: Diagnosis not present

## 2013-10-11 DIAGNOSIS — J9811 Atelectasis: Secondary | ICD-10-CM

## 2013-10-11 DIAGNOSIS — Y9389 Activity, other specified: Secondary | ICD-10-CM | POA: Insufficient documentation

## 2013-10-11 DIAGNOSIS — S3992XA Unspecified injury of lower back, initial encounter: Secondary | ICD-10-CM | POA: Diagnosis not present

## 2013-10-11 DIAGNOSIS — Z8669 Personal history of other diseases of the nervous system and sense organs: Secondary | ICD-10-CM | POA: Insufficient documentation

## 2013-10-11 DIAGNOSIS — Z86718 Personal history of other venous thrombosis and embolism: Secondary | ICD-10-CM | POA: Diagnosis not present

## 2013-10-11 DIAGNOSIS — I10 Essential (primary) hypertension: Secondary | ICD-10-CM | POA: Insufficient documentation

## 2013-10-11 DIAGNOSIS — Z720 Tobacco use: Secondary | ICD-10-CM | POA: Diagnosis not present

## 2013-10-11 DIAGNOSIS — F329 Major depressive disorder, single episode, unspecified: Secondary | ICD-10-CM | POA: Insufficient documentation

## 2013-10-11 DIAGNOSIS — S299XXA Unspecified injury of thorax, initial encounter: Secondary | ICD-10-CM | POA: Insufficient documentation

## 2013-10-11 DIAGNOSIS — K219 Gastro-esophageal reflux disease without esophagitis: Secondary | ICD-10-CM | POA: Insufficient documentation

## 2013-10-11 MED ORDER — CYCLOBENZAPRINE HCL 10 MG PO TABS: 10.0000 mg | ORAL_TABLET | Freq: Two times a day (BID) | ORAL | Status: AC | PRN

## 2013-10-11 NOTE — ED Provider Notes (Signed)
CSN: [id number]     Arrival date & time 10/11/13  1311 History  This chart was scribed for Oswaldo Conroy, [place] with Shon Baton, MD by Tonye Royalty, ED Scribe. This patient was seen in room WTR7/WTR7 and the patient's care was started at 3:14 PM.    Chief Complaint  Patient presents with  . Rib Pain   . Fall  . Back Pain   The history is provided by the patient. No language interpreter was used.   HPI Comments: Jesse Hartman is a 53 y.o. male who presents to the Emergency Department complaining of left rib pain from falling yesterday. He reports associated wound to his left great toe. He also complains of pain to his right lower back radiating to his right buttocks and leg; he reports history of degenerative disc disease and prior surgery. He denies weakness in his legs but states he has to walk slowly. He states back pain is worse with walking and states it feels like his right leg is numb and reports sharp pain to his buttock. He reports tingling to his hands and says his PCP told him he is developing RA. He denies IV drug use. He states he does not sleep well because of COPD and sleep apnea; he states that he smokes. He denies fever, chills, weight loss, night sweats, dysuria, numbness or tingling to groin or anus, abdominal pain, or urinary changes.  Past Medical History  Diagnosis Date  . Hypertension   . Barrett's syndrome   . Arthritis   . Hypokalemia   . Hyponatremia   . Esophagitis   . GERD (gastroesophageal reflux disease)   . DVT (deep venous thrombosis)   . COPD (chronic obstructive pulmonary disease)   . Depression   . Substance abuse   . Hiatal Hernia 06/22/2013  . 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 Hypercalcemia   . Anxiety   . Asthma   . Blood transfusion without reported diagnosis   . Sleep apnea   . Seizures    Past Surgical History  Procedure Laterality Date  . Rhinoplasty    . Bunionectomy    . Appendectomy    . Esophagogastroduodenoscopy  08/03/2011     Procedure: ESOPHAGOGASTRODUODENOSCOPY (EGD);  Surgeon: Rachael Fee, MD;  Location: [facility];  Service: Endoscopy;  Laterality: N/A;  . Finger surgery      lt middle  . Esophagogastroduodenoscopy N/A 01/24/2013    Procedure: ESOPHAGOGASTRODUODENOSCOPY (EGD);  Surgeon: Hart Carwin, MD;  Location: [facility];  Service: Endoscopy;  Laterality: N/A;  . Nasal sinus surgery    . Back surgery      L1 L2  . Esophagogastroduodenoscopy N/A 06/22/2013    Procedure: ESOPHAGOGASTRODUODENOSCOPY (EGD);  Surgeon: Iva Boop, MD;  Location: [facility];  Service: Endoscopy;  Laterality: N/A;   Family History  Problem Relation Age of Onset  . Diabetes Maternal Grandfather   . Heart disease Father    History  Substance Use Topics  . Smoking status: Current Every Day Smoker -- 0.25 packs/day for 30 years    Types: Cigarettes  . Smokeless tobacco: Never Used  . Alcohol Use: 16.8 oz/week    28 Cans of beer per week    Review of Systems  Constitutional: Negative for fever, chills and unexpected weight change.       Denies night sweats  Gastrointestinal: Negative for abdominal pain.  Genitourinary: Negative for dysuria, urgency, frequency, decreased urine volume, difficulty urinating and testicular pain.  Musculoskeletal: Positive for  back pain.  Skin: Positive for wound.  Neurological: Positive for numbness. Negative for weakness.      Allergies  Food  Home Medications   Prior to Admission medications   Medication Sig Start Date End Date Taking? Authorizing Provider  albuterol (PROVENTIL HFA;VENTOLIN HFA) 108 (90 BASE) MCG/ACT inhaler Inhale 2 puffs into the lungs every 6 (six) hours as needed for wheezing or shortness of breath.   Yes Historical Provider, MD  amLODipine-olmesartan (AZOR) 10-40 MG per tablet Take 1 tablet by mouth every other day. Lot [id number]  Exp 04/06/2015 09/29/12  Yes Historical Provider, MD  budesonide-formoterol (SYMBICORT) 160-4.5 MCG/ACT inhaler Inhale 2  puffs into the lungs daily.   Yes Historical Provider, MD  escitalopram (LEXAPRO) 10 MG tablet Take 10 mg by mouth daily.   Yes Historical Provider, MD  metoprolol succinate (TOPROL-XL) 50 MG 24 hr tablet Take 50 mg by mouth daily. Take with or immediately following a meal.   Yes Historical Provider, MD  Omega-3 Fatty Acids (FISH OIL) 1000 MG CAPS Take 1,000 mg by mouth daily.   Yes Historical Provider, MD  pantoprazole (PROTONIX) 40 MG tablet Take 1 tablet (40 mg total) by mouth 2 (two) times daily. 08/01/13  Yes Irene Shipper, MD  Tiotropium Bromide Monohydrate (SPIRIVA RESPIMAT) 2.5 MCG/ACT AERS Inhale 1 capsule into the lungs 2 (two) times daily.   Yes Historical Provider, MD  cyclobenzaprine (FLEXERIL) 10 MG tablet Take 1 tablet (10 mg total) by mouth 2 (two) times daily as needed for muscle spasms. 10/11/13   Pura Spice, PA-C  peg 3350 powder (MOVIPREP) 100 G SOLR Take 1 kit (200 g total) by mouth once. 08/01/13   Irene Shipper, MD   BP 139/89  Pulse 87  Temp(Src) 98.5 F (36.9 C) (Oral)  Resp 18  SpO2 100% Physical Exam  Nursing note and vitals reviewed. Constitutional: He appears well-developed and well-nourished. No distress.  HENT:  Head: Normocephalic and atraumatic.  Eyes: Conjunctivae are normal. Right eye exhibits no discharge. Left eye exhibits no discharge.  Cardiovascular: Normal rate, regular rhythm and normal heart sounds.   No murmur heard. Pulmonary/Chest: Effort normal and breath sounds normal. No respiratory distress. He has no wheezes. He has no rales. He exhibits tenderness (point tenderness to left rib area).  Abdominal: Soft. Bowel sounds are normal. He exhibits no distension. There is no tenderness.  Musculoskeletal:  No midline back tenderness, step off or crepitus. Right Left sided lower back tenderness. No CVA tenderness. Equal muscle tone. 5/5 strength in lower extremities. DTR equal and intact. Negative straight leg test. Antalgic gait.  Neurological: He  is alert. Coordination normal.  Skin: Skin is warm and dry. He is not diaphoretic.  Psychiatric: He has a normal mood and affect. His behavior is normal.    ED Course  Procedures (including critical care time) Labs Review Labs Reviewed - No data to display  Imaging Review Dg Chest 2 View  10/11/2013   CLINICAL DATA:  Status post fall yesterday with pain left anterior rib cage; pleuritic chest pain; history of tobacco use and COPD ; initial visit  EXAM: CHEST  2 VIEW  COMPARISON:  Portable chest x-ray of June 21, 2013.  FINDINGS: The lungs are adequately inflated. There is minimal subsegmental atelectasis at the left lung base. There is no pleural effusion or pneumothorax or pneumomediastinum. The heart and pulmonary vascularity are normal. The observed portions of the bony thorax exhibit no acute abnormalities. There is degenerative disc space  narrowing at multiple thoracic levels.  IMPRESSION: There is new left basilar atelectasis. There is no pneumothorax pleural effusion or pulmonary contusion.   Electronically Signed   By: [place]  [place]   On: 10/11/2013 14:11     EKG Interpretation None     DIAGNOSTIC STUDIES: Oxygen Saturation is 100% on room air, normal by my interpretation.    COORDINATION OF CARE: 3:23 PM I discussed with the patient his chest x-rays that do not show acute rib fracture. He has point tenderness consistent with muscular pain; I will prescribe muscle relaxant but do not feel that he needs pain medication. I also discussed with him evidence of atelectasis from his chronic smoking and COPD and instructed him use his nebulizer at home and follow up with his PCP. Patient back pain free to contact the orthopedist he saw previously for further evaluation of his back..   MDM   Final diagnoses:  Atelectasis of left lung  Right-sided low back pain without sciatica   Patient with back pain. No loss of bowel or bladder control. No saddle anesthesia. No fever, night sweats,  weight loss, h/o cancer, IVDU. VSS. No neurological deficits and normal neuro exam. Patient can walk but states is painful. No concern for cauda equina.  RICE protocol and muscle relaxer indicated and discussed with patient. Driving and sedation precautions provided. Patient is afebrile, nontoxic, and in no acute distress. Patient is appropriate for outpatient management and is stable for discharge. Follow up with PCP or prior orthopedist. Patient also with minimal left lung base atelectasis. I suspect this is due to his chronic smoking history and COPD. Patient to use nebulizer at home and all his other COPD meds and follow up with PCP.  Discussed return precautions with patient. Discussed all results and patient verbalizes understanding and agrees with plan.  I personally performed the services described in this documentation, which was scribed in my presence. The recorded information has been reviewed and is accurate.   Pura Spice, PA-C 10/11/13 2217

## 2013-10-11 NOTE — Discharge Instructions (Signed)
Return to the emergency room with worsening of symptoms, new symptoms or with symptoms that are concerning, especially fevers, productive cough with thick colored mucous, difficulty breathing, SOB or chest pain. Also return to ED fevers, loss of control of bladder or bowels, numbness or tingling around genital region or anus, weakness. Follow up with PCP. Call to make appointment as soon as you can for follow up with atelectasis. Use your albuterol nebulizer at home and all your COPD meds.  RICE: Rest, Ice (three cycles of 20 mins on, 62mins off at least twice a day), compression/brace, elevation. Heating pad works well for back pain. Ibuprofen 400mg  (2 tablets 200mg ) every 5-6 hours for 3-5 days and then as needed for pain. Muscle relaxer for muscle tightness. Do not operate machinery, drive or drink alcohol while taking narcotics or muscle relaxers. Follow up with orthopedist if symptoms persistent or worsening.    Atelectasis Atelectasis is a collapse of the small air sacs in the lungs (alveoli). When this occurs, all or part of a lung collapses and becomes airless. It can be caused by various things and is a common problem after surgery. The severity of atelectasis will vary depending on the size of the area involved and the underlying cause of the condition. CAUSES  There are multiple causes for atelectasis:   Shallow breathing, particularly if there is an injury to your chest wall or abdomen that makes it painful to take a deep breath. This commonly occurs after surgery.  Obstruction of your airways (bronchi or bronchioles). This may be caused by a buildup of mucus (mucus plug), tumors, blood clots (pulmonary embolus), or inhaled foreign bodies. Mucus plugs occur when the lungs do not expand enough to get rid of mucus.  Outside pressure on the lung. This may be caused by tumors, fluid (pleural effusion), or a leakage of air between the lung and rib cage (pneumothorax).   Infections such as  pneumonia.  Scarring in lung tissue left over from previous infection or injury.  Some diseases such as cystic fibrosis. SIGNS AND SYMPTOMS  Often, atelectasis will have no symptoms. When symptoms occur, they include:  Shortness of breath.   Bluish color to your nails, lips, or mouth (cyanosis). DIAGNOSIS  Your health care provider may suspect atelectasis based on symptoms and physical findings. A chest X-ray may be done to confirm the diagnosis. More specialized X-ray exams are sometimes required.  TREATMENT  Treatment will depend on the cause of the atelectasis. Treatment may include:  Purposeful coughing to loosen mucus plugs in the lungs.  Chest physiotherapy. This consists of clapping or percussion on the chest over the lungs to further loosen mucus plugs.  Postural drainage techniques. This involves positioning your body so your head is lower than your chest. [place] relaxed deep breathing whenever you are sitting down. A good technique is to take a few relaxed deep breaths each time a commercial comes on if you are watching television.  If you were given a deep breathing device (such as an incentive spirometer) or a mucus clearance device, use this regularly as directed by your health care provider.  Try to cough several times a day as directed by your health care provider.  Perform any chest physiotherapy or postural drainage techniques as directed by your health care provider. If necessary, have someone (such as a family member) assist you with these techniques.  When lying down, lie on the unaffected side to encourage mucus drainage.  Stay physically  active as much as possible. SEEK IMMEDIATE MEDICAL CARE IF:   You develop increasing problems with your breathing.   You develop severe chest pain.   You develop severe coughing, or you cough up blood.   You have a fever or persistent symptoms for more than 2-3 days.   You have a fever  and your symptoms suddenly get worse.  MAKE SURE YOU:  Understand these instructions.  Will watch your condition.  Will get help right away if you are not doing well or get worse. Document Released: 12/22/2004 Document Revised: 12/27/2012 Document Reviewed: 06/29/2012 [facility] Patient Information 2015 [place], [place]. This information is not intended to replace advice given to you by your health care provider. Make sure you discuss any questions you have with your health care provider.

## 2013-10-11 NOTE — ED Notes (Signed)
Pt w/ strong odor of Etoh.  Pt states that he has chronic back pain that is radiating down rt leg.  States that he fell yesterday against some concrete stairs onto lt ribs.  Thinks that he broke his ribs.

## 2013-10-12 NOTE — ED Provider Notes (Signed)
Medical screening examination/treatment/procedure(s) were performed by non-physician practitioner and as supervising physician I was immediately available for consultation/collaboration.   EKG Interpretation None        Merryl Hacker, MD 10/12/13 [phone]

## 2013-12-06 ENCOUNTER — Encounter (HOSPITAL_COMMUNITY): Payer: Self-pay

## 2013-12-06 ENCOUNTER — Emergency Department (HOSPITAL_COMMUNITY): Payer: Medicaid Other

## 2013-12-06 ENCOUNTER — Inpatient Hospital Stay (HOSPITAL_COMMUNITY)
Admission: EM | Admit: 2013-12-06 | Discharge: 2013-12-13 | DRG: 056 | Disposition: A | Discharge status: Home / Self Care | Payer: Medicaid Other | Attending: Internal Medicine | Admitting: Internal Medicine

## 2013-12-06 DIAGNOSIS — N189 Chronic kidney disease, unspecified: Secondary | ICD-10-CM | POA: Diagnosis present

## 2013-12-06 DIAGNOSIS — F10239 Alcohol dependence with withdrawal, unspecified: Secondary | ICD-10-CM | POA: Diagnosis not present

## 2013-12-06 DIAGNOSIS — G312 Degeneration of nervous system due to alcohol: Secondary | ICD-10-CM | POA: Diagnosis not present

## 2013-12-06 DIAGNOSIS — T424X2A Poisoning by benzodiazepines, intentional self-harm, initial encounter: Secondary | ICD-10-CM | POA: Diagnosis present

## 2013-12-06 DIAGNOSIS — F419 Anxiety disorder, unspecified: Secondary | ICD-10-CM | POA: Diagnosis present

## 2013-12-06 DIAGNOSIS — Z91011 Allergy to milk products: Secondary | ICD-10-CM

## 2013-12-06 DIAGNOSIS — G92 Toxic encephalopathy: Secondary | ICD-10-CM | POA: Diagnosis present

## 2013-12-06 DIAGNOSIS — F1020 Alcohol dependence, uncomplicated: Secondary | ICD-10-CM

## 2013-12-06 DIAGNOSIS — Z720 Tobacco use: Secondary | ICD-10-CM

## 2013-12-06 DIAGNOSIS — F329 Major depressive disorder, single episode, unspecified: Secondary | ICD-10-CM | POA: Diagnosis present

## 2013-12-06 DIAGNOSIS — Z86718 Personal history of other venous thrombosis and embolism: Secondary | ICD-10-CM

## 2013-12-06 DIAGNOSIS — M1990 Unspecified osteoarthritis, unspecified site: Secondary | ICD-10-CM | POA: Diagnosis present

## 2013-12-06 DIAGNOSIS — N179 Acute kidney failure, unspecified: Secondary | ICD-10-CM | POA: Diagnosis present

## 2013-12-06 DIAGNOSIS — G8929 Other chronic pain: Secondary | ICD-10-CM | POA: Diagnosis present

## 2013-12-06 DIAGNOSIS — J45909 Unspecified asthma, uncomplicated: Secondary | ICD-10-CM | POA: Diagnosis present

## 2013-12-06 DIAGNOSIS — K2210 Ulcer of esophagus without bleeding: Secondary | ICD-10-CM | POA: Diagnosis present

## 2013-12-06 DIAGNOSIS — G4733 Obstructive sleep apnea (adult) (pediatric): Secondary | ICD-10-CM | POA: Diagnosis present

## 2013-12-06 DIAGNOSIS — R569 Unspecified convulsions: Secondary | ICD-10-CM | POA: Diagnosis present

## 2013-12-06 DIAGNOSIS — I129 Hypertensive chronic kidney disease with stage 1 through stage 4 chronic kidney disease, or unspecified chronic kidney disease: Secondary | ICD-10-CM | POA: Diagnosis present

## 2013-12-06 DIAGNOSIS — R826 Abnormal urine levels of substances chiefly nonmedicinal as to source: Secondary | ICD-10-CM | POA: Diagnosis present

## 2013-12-06 DIAGNOSIS — I10 Essential (primary) hypertension: Secondary | ICD-10-CM | POA: Diagnosis present

## 2013-12-06 DIAGNOSIS — T426X2A Poisoning by other antiepileptic and sedative-hypnotic drugs, intentional self-harm, initial encounter: Secondary | ICD-10-CM | POA: Diagnosis present

## 2013-12-06 DIAGNOSIS — K2270 Barrett's esophagus without dysplasia: Secondary | ICD-10-CM | POA: Diagnosis present

## 2013-12-06 DIAGNOSIS — R69 Illness, unspecified: Secondary | ICD-10-CM

## 2013-12-06 DIAGNOSIS — T50904A Poisoning by unspecified drugs, medicaments and biological substances, undetermined, initial encounter: Secondary | ICD-10-CM

## 2013-12-06 DIAGNOSIS — F19121 Other psychoactive substance abuse with intoxication delirium: Secondary | ICD-10-CM | POA: Diagnosis present

## 2013-12-06 DIAGNOSIS — Z9119 Patient's noncompliance with other medical treatment and regimen: Secondary | ICD-10-CM | POA: Diagnosis present

## 2013-12-06 DIAGNOSIS — Z599 Problem related to housing and economic circumstances, unspecified: Secondary | ICD-10-CM

## 2013-12-06 DIAGNOSIS — I959 Hypotension, unspecified: Secondary | ICD-10-CM | POA: Diagnosis present

## 2013-12-06 DIAGNOSIS — R410 Disorientation, unspecified: Secondary | ICD-10-CM | POA: Diagnosis present

## 2013-12-06 DIAGNOSIS — J449 Chronic obstructive pulmonary disease, unspecified: Secondary | ICD-10-CM | POA: Diagnosis present

## 2013-12-06 DIAGNOSIS — R0789 Other chest pain: Secondary | ICD-10-CM

## 2013-12-06 DIAGNOSIS — G9340 Encephalopathy, unspecified: Secondary | ICD-10-CM | POA: Diagnosis present

## 2013-12-06 DIAGNOSIS — E871 Hypo-osmolality and hyponatremia: Secondary | ICD-10-CM | POA: Diagnosis present

## 2013-12-06 DIAGNOSIS — F10231 Alcohol dependence with withdrawal delirium: Secondary | ICD-10-CM | POA: Diagnosis present

## 2013-12-06 DIAGNOSIS — J9811 Atelectasis: Secondary | ICD-10-CM | POA: Diagnosis present

## 2013-12-06 DIAGNOSIS — F1994 Other psychoactive substance use, unspecified with psychoactive substance-induced mood disorder: Secondary | ICD-10-CM | POA: Diagnosis present

## 2013-12-06 DIAGNOSIS — F10939 Alcohol use, unspecified with withdrawal, unspecified: Secondary | ICD-10-CM | POA: Diagnosis present

## 2013-12-06 DIAGNOSIS — K219 Gastro-esophageal reflux disease without esophagitis: Secondary | ICD-10-CM | POA: Diagnosis present

## 2013-12-06 DIAGNOSIS — M549 Dorsalgia, unspecified: Secondary | ICD-10-CM | POA: Diagnosis present

## 2013-12-06 DIAGNOSIS — R451 Restlessness and agitation: Secondary | ICD-10-CM | POA: Diagnosis present

## 2013-12-06 DIAGNOSIS — F1910 Other psychoactive substance abuse, uncomplicated: Secondary | ICD-10-CM

## 2013-12-06 DIAGNOSIS — K449 Diaphragmatic hernia without obstruction or gangrene: Secondary | ICD-10-CM | POA: Diagnosis present

## 2013-12-06 DIAGNOSIS — Z8249 Family history of ischemic heart disease and other diseases of the circulatory system: Secondary | ICD-10-CM

## 2013-12-06 DIAGNOSIS — T50901A Poisoning by unspecified drugs, medicaments and biological substances, accidental (unintentional), initial encounter: Secondary | ICD-10-CM | POA: Diagnosis present

## 2013-12-06 LAB — URINALYSIS, ROUTINE W REFLEX MICROSCOPIC
Bilirubin Urine: NEGATIVE
Glucose, UA: NEGATIVE mg/dL
Hgb urine dipstick: NEGATIVE
Ketones, ur: NEGATIVE mg/dL
Leukocytes, UA: NEGATIVE
Nitrite: NEGATIVE
PROTEIN: NEGATIVE mg/dL
Specific Gravity, Urine: 1.005 (ref 1.005–1.030)
Urobilinogen, UA: 0.2 mg/dL (ref 0.0–1.0)
pH: 5 (ref 5.0–8.0)

## 2013-12-06 LAB — CBC WITH DIFFERENTIAL/PLATELET
BASOS PCT: 0 % (ref 0–1)
Basophils Absolute: 0 10*3/uL (ref 0.0–0.1)
EOS PCT: 1 % (ref 0–5)
Eosinophils Absolute: 0.1 10*3/uL (ref 0.0–0.7)
HCT: 41.7 % (ref 39.0–52.0)
Hemoglobin: 13.8 g/dL (ref 13.0–17.0)
Lymphocytes Relative: 25 % (ref 12–46)
Lymphs Abs: 1.8 10*3/uL (ref 0.7–4.0)
MCH: 26.9 pg (ref 26.0–34.0)
MCHC: 33.1 g/dL (ref 30.0–36.0)
MCV: 81.3 fL (ref 78.0–100.0)
MONOS PCT: 8 % (ref 3–12)
Monocytes Absolute: 0.6 10*3/uL (ref 0.1–1.0)
Neutro Abs: 4.7 10*3/uL (ref 1.7–7.7)
Neutrophils Relative %: 66 % (ref 43–77)
PLATELETS: 263 10*3/uL (ref 150–400)
RBC: 5.13 MIL/uL (ref 4.22–5.81)
RDW: 16.5 % — ABNORMAL HIGH (ref 11.5–15.5)
WBC: 7.2 10*3/uL (ref 4.0–10.5)

## 2013-12-06 LAB — I-STAT TROPONIN, ED: Troponin i, poc: 0 ng/mL (ref 0.00–0.08)

## 2013-12-06 LAB — BLOOD GAS, ARTERIAL
Acid-base deficit: 3.9 mmol/L — ABNORMAL HIGH (ref 0.0–2.0)
Bicarbonate: 20.2 mEq/L (ref 20.0–24.0)
Drawn by: 307971
FIO2: 0.21 %
O2 Saturation: 95.1 %
Patient temperature: 98.6
TCO2: 17.9 mmol/L (ref 0–100)
pCO2 arterial: 35.5 mmHg (ref 35.0–45.0)
pH, Arterial: 7.373 (ref 7.350–7.450)
pO2, Arterial: 79 mmHg — ABNORMAL LOW (ref 80.0–100.0)

## 2013-12-06 LAB — PRO B NATRIURETIC PEPTIDE: Pro B Natriuretic peptide (BNP): 80 pg/mL (ref 0–125)

## 2013-12-06 LAB — COMPREHENSIVE METABOLIC PANEL
ALT: 21 U/L (ref 0–53)
AST: 24 U/L (ref 0–37)
Albumin: 4 g/dL (ref 3.5–5.2)
Alkaline Phosphatase: 79 U/L (ref 39–117)
Anion gap: 18 — ABNORMAL HIGH (ref 5–15)
BUN: 17 mg/dL (ref 6–23)
CALCIUM: 9.9 mg/dL (ref 8.4–10.5)
CO2: 22 meq/L (ref 19–32)
CREATININE: 2.56 mg/dL — AB (ref 0.50–1.35)
Chloride: 92 mEq/L — ABNORMAL LOW (ref 96–112)
GFR, EST AFRICAN AMERICAN: 32 mL/min — AB (ref >90)
GFR, EST NON AFRICAN AMERICAN: 27 mL/min — AB (ref >90)
GLUCOSE: 90 mg/dL (ref 70–99)
Potassium: 3.7 mEq/L (ref 3.7–5.3)
Sodium: 132 mEq/L — ABNORMAL LOW (ref 137–147)
Total Bilirubin: 0.3 mg/dL (ref 0.3–1.2)
Total Protein: 7.8 g/dL (ref 6.0–8.3)

## 2013-12-06 LAB — RAPID URINE DRUG SCREEN, HOSP PERFORMED
AMPHETAMINES: NOT DETECTED
BARBITURATES: NOT DETECTED
BENZODIAZEPINES: POSITIVE — AB
Cocaine: NOT DETECTED
Opiates: NOT DETECTED
TETRAHYDROCANNABINOL: NOT DETECTED

## 2013-12-06 LAB — MRSA PCR SCREENING: MRSA by PCR: NEGATIVE

## 2013-12-06 LAB — ETHANOL

## 2013-12-06 LAB — LIPASE, BLOOD: Lipase: 24 U/L (ref 11–59)

## 2013-12-06 LAB — PROTIME-INR
INR: 1 (ref 0.00–1.49)
PROTHROMBIN TIME: 13.3 s (ref 11.6–15.2)

## 2013-12-06 LAB — I-STAT CG4 LACTIC ACID, ED: Lactic Acid, Venous: 0.9 mmol/L (ref 0.5–2.2)

## 2013-12-06 LAB — AMMONIA: Ammonia: 11 umol/L (ref 11–60)

## 2013-12-06 MED ORDER — ESCITALOPRAM OXALATE 20 MG PO TABS
10.0000 mg | ORAL_TABLET | Freq: Every day | ORAL | Status: DC
  Administered 2013-12-07: 10 mg via ORAL
  Filled 2013-12-06: qty 0.5
  Filled 2013-12-06: qty 1

## 2013-12-06 MED ORDER — ALBUTEROL SULFATE (2.5 MG/3ML) 0.083% IN NEBU
2.5000 mg | INHALATION_SOLUTION | Freq: Three times a day (TID) | RESPIRATORY_TRACT | Status: DC
  Administered 2013-12-07: 2.5 mg via RESPIRATORY_TRACT
  Filled 2013-12-06: qty 3

## 2013-12-06 MED ORDER — TIOTROPIUM BROMIDE MONOHYDRATE 18 MCG IN CAPS
18.0000 ug | ORAL_CAPSULE | Freq: Every day | RESPIRATORY_TRACT | Status: DC
  Administered 2013-12-07: 18 ug via RESPIRATORY_TRACT
  Filled 2013-12-06: qty 5

## 2013-12-06 MED ORDER — M.V.I. ADULT IV INJ
INJECTION | Freq: Once | INTRAVENOUS | Status: AC
  Administered 2013-12-06: 17:00:00 via INTRAVENOUS
  Filled 2013-12-06: qty 1000

## 2013-12-06 MED ORDER — ALUM & MAG HYDROXIDE-SIMETH 200-200-20 MG/5ML PO SUSP
30.0000 mL | Freq: Four times a day (QID) | ORAL | Status: DC | PRN
Start: 2013-12-06 — End: 2013-12-07

## 2013-12-06 MED ORDER — HYDROMORPHONE HCL 1 MG/ML IJ SOLN
0.5000 mg | INTRAMUSCULAR | Status: DC | PRN
  Administered 2013-12-09: 0.5 mg via INTRAVENOUS
  Filled 2013-12-06: qty 1

## 2013-12-06 MED ORDER — ACETAMINOPHEN 325 MG PO TABS
650.0000 mg | ORAL_TABLET | Freq: Four times a day (QID) | ORAL | Status: DC | PRN
  Administered 2013-12-06: 650 mg via ORAL
  Filled 2013-12-06: qty 2

## 2013-12-06 MED ORDER — RESOURCE THICKENUP CLEAR PO POWD
ORAL | Status: DC | PRN
  Filled 2013-12-06: qty 125

## 2013-12-06 MED ORDER — IPRATROPIUM-ALBUTEROL 0.5-2.5 (3) MG/3ML IN SOLN
3.0000 mL | RESPIRATORY_TRACT | Status: DC
  Administered 2013-12-06: 3 mL via RESPIRATORY_TRACT
  Filled 2013-12-06: qty 3

## 2013-12-06 MED ORDER — ENOXAPARIN SODIUM 40 MG/0.4ML ~~LOC~~ SOLN
40.0000 mg | SUBCUTANEOUS | Status: DC
  Administered 2013-12-06 – 2013-12-12 (×7): 40 mg via SUBCUTANEOUS
  Filled 2013-12-06 (×9): qty 0.4

## 2013-12-06 MED ORDER — THIAMINE HCL 100 MG/ML IJ SOLN
Freq: Once | INTRAVENOUS | Status: AC
  Administered 2013-12-06: 23:00:00 via INTRAVENOUS
  Filled 2013-12-06: qty 1000

## 2013-12-06 MED ORDER — IPRATROPIUM-ALBUTEROL 0.5-2.5 (3) MG/3ML IN SOLN
3.0000 mL | Freq: Four times a day (QID) | RESPIRATORY_TRACT | Status: DC | PRN
  Filled 2013-12-06: qty 3

## 2013-12-06 MED ORDER — METOPROLOL SUCCINATE ER 25 MG PO TB24
50.0000 mg | ORAL_TABLET | Freq: Every day | ORAL | Status: DC
  Administered 2013-12-06 – 2013-12-07 (×2): 50 mg via ORAL
  Filled 2013-12-06: qty 1
  Filled 2013-12-06: qty 2

## 2013-12-06 MED ORDER — SODIUM CHLORIDE 0.9 % IV SOLN
INTRAVENOUS | Status: DC
  Administered 2013-12-07: 17:00:00 via INTRAVENOUS

## 2013-12-06 MED ORDER — OXYCODONE HCL 5 MG PO TABS
5.0000 mg | ORAL_TABLET | ORAL | Status: DC | PRN
  Administered 2013-12-06: 5 mg via ORAL
  Filled 2013-12-06: qty 1

## 2013-12-06 MED ORDER — ONDANSETRON HCL 4 MG/2ML IJ SOLN: 4.0000 mg | Freq: Four times a day (QID) | INTRAMUSCULAR | Status: DC | PRN

## 2013-12-06 MED ORDER — LORAZEPAM 2 MG/ML IJ SOLN
2.0000 mg | INTRAMUSCULAR | Status: DC | PRN
  Administered 2013-12-06: 2 mg via INTRAVENOUS
  Administered 2013-12-06 – 2013-12-07 (×4): 3 mg via INTRAVENOUS
  Filled 2013-12-06: qty 1
  Filled 2013-12-06 (×4): qty 2

## 2013-12-06 MED ORDER — ONDANSETRON HCL 4 MG PO TABS: 4.0000 mg | ORAL_TABLET | Freq: Four times a day (QID) | ORAL | Status: DC | PRN

## 2013-12-06 MED ORDER — ACETAMINOPHEN 650 MG RE SUPP
650.0000 mg | Freq: Four times a day (QID) | RECTAL | Status: DC | PRN
Start: 2013-12-06 — End: 2013-12-07

## 2013-12-06 MED ORDER — PANTOPRAZOLE SODIUM 40 MG PO TBEC
40.0000 mg | DELAYED_RELEASE_TABLET | Freq: Two times a day (BID) | ORAL | Status: DC
  Administered 2013-12-06 – 2013-12-07 (×2): 40 mg via ORAL
  Filled 2013-12-06 (×2): qty 1

## 2013-12-06 NOTE — ED Notes (Signed)
Pt c/o sharp, R side chest pain radiating into center chest and SOB starting this afternoon and polysubstance intoxication.  Pain score 5/10.  Pt admits to drinking 8-10 beers and taking several unknown pills, possibly Xanax.  Pt's daughter sts that she found him at a residence that is known to abuse prescription drugs.  Pt continues to mumble and can barely answer questions.  Hx of Barrett's syndrome and COPD.

## 2013-12-06 NOTE — ED Provider Notes (Signed)
CSN: [id number]     Arrival date & time 12/06/13  1545 History   First MD Initiated Contact with Patient 12/06/13 1556     Chief Complaint  Patient presents with  . Chest Pain  . Alcohol Intoxication     (Consider location/radiation/quality/duration/timing/severity/associated sxs/prior Treatment) HPI  Picked daughter up from work 5pm yesterday. At that time thought he had taken Xanax or other medications. Today she had to go to various friends house find him. When she found him he was passed out with a friend in the house. She reports he had been drinking heavily and may have taken more prescription meds. She didn't think it was safe to leave him like that given all of his other medical problems. She reports when she got him into the car he complained of some chest pain. She reports his speech is slurred but not unusual when so intoxicated. No recent acute illness noted. Right leg dysfunctional for about 2 months. Daughter reports Medicaid denied MRI and has to go to PT first. Patient started using a cane in the past few weeks. Patient reports fell last night but didn't hit head, hurt right arm.  Past Medical History  Diagnosis Date  . Hypertension   . Barrett's syndrome   . Arthritis   . Hypokalemia   . Hyponatremia   . Esophagitis   . GERD (gastroesophageal reflux disease)   . DVT (deep venous thrombosis)   . COPD (chronic obstructive pulmonary disease)   . Depression   . Substance abuse   . Hiatal Hernia 06/22/2013  . 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 Hypercalcemia   . Anxiety   . Asthma   . Blood transfusion without reported diagnosis   . Sleep apnea   . Seizures    Past Surgical History  Procedure Laterality Date  . Rhinoplasty    . Bunionectomy    . Appendectomy    . Esophagogastroduodenoscopy  08/03/2011    Procedure: ESOPHAGOGASTRODUODENOSCOPY (EGD);  Surgeon: Milus Banister, MD;  Location: [facility];  Service: Endoscopy;  Laterality: N/A;  . Finger surgery       lt middle  . Esophagogastroduodenoscopy N/A 01/24/2013    Procedure: ESOPHAGOGASTRODUODENOSCOPY (EGD);  Surgeon: Lafayette Dragon, MD;  Location: [facility] END[facility]COPY;  Service: Endoscopy;  Laterality: N/A;  . Nasal sinus surgery    . Back surgery      L1 L2  . Esophagogastroduodenoscopy N/A 06/22/2013    Procedure: ESOPHAGOGASTRODUODENOSCOPY (EGD);  Surgeon: Gatha Mayer, MD;  Location: [facility];  Service: Endoscopy;  Laterality: N/A;   Family History  Problem Relation Age of Onset  . Diabetes Maternal Grandfather   . Heart disease Father    History  Substance Use Topics  . Smoking status: Current Every Day Smoker -- 0.25 packs/day for 30 years    Types: Cigarettes  . Smokeless tobacco: Never Used  . Alcohol Use: 16.8 oz/week    28 Cans of beer per week    Review of Systems 10 Systems reviewed and are negative for acute change except as noted in the HPI. At this time patient limited reliability. Most history from daughter.   Allergies  Food  Home Medications   Prior to Admission medications   Medication Sig Start Date End Date Taking? Authorizing Provider  albuterol (PROVENTIL HFA;VENTOLIN HFA) 108 (90 BASE) MCG/ACT inhaler Inhale 2 puffs into the lungs every 6 (six) hours as needed for wheezing or shortness of breath (copd).    Yes Historical Provider, MD  ALPRAZolam (  XANAX) 1 MG tablet Take 1 mg by mouth daily as needed for anxiety (anxiety).   Yes Historical Provider, MD  amLODipine-olmesartan (AZOR) 10-40 MG per tablet Take 1 tablet by mouth every other day. Lot [id number]  Exp 04/06/2015 09/29/12  Yes Historical Provider, MD  budesonide-formoterol (SYMBICORT) 160-4.5 MCG/ACT inhaler Inhale 2 puffs into the lungs daily.   Yes Historical Provider, MD  cyclobenzaprine (FLEXERIL) 10 MG tablet Take 1 tablet (10 mg total) by mouth 2 (two) times daily as needed for muscle spasms. 10/11/13  Yes Pura Spice, PA-C  escitalopram (LEXAPRO) 10 MG tablet Take 10 mg by mouth daily.   Yes  Historical Provider, MD  metoprolol succinate (TOPROL-XL) 50 MG 24 hr tablet Take 50 mg by mouth daily. Take with or immediately following a meal.   Yes Historical Provider, MD  Omega-3 Fatty Acids (FISH OIL) 1000 MG CAPS Take 1,000 mg by mouth daily.   Yes Historical Provider, MD  pantoprazole (PROTONIX) 40 MG tablet Take 1 tablet (40 mg total) by mouth 2 (two) times daily. 08/01/13  Yes Irene Shipper, MD  Tiotropium Bromide Monohydrate (SPIRIVA RESPIMAT) 2.5 MCG/ACT AERS Inhale 1 capsule into the lungs 2 (two) times daily.   Yes Historical Provider, MD   BP 131/71 mmHg  Pulse 75  Temp(Src) 99.4 F (37.4 C) (Oral)  Resp 34  Ht 6\' 2"  (1.88 m)  Wt 238 lb 12.1 oz (108.3 kg)  BMI 30.64 kg/m2  SpO2 92% Physical Exam  Constitutional: He appears well-developed and well-nourished.  Appears very intoxicated. Speech slurred. No resp distress. Nontoxic  HENT:  Head: Normocephalic and atraumatic.  Right Ear: External ear normal.  Left Ear: External ear normal.  Nose: Nose normal.  Mouth/Throat: Oropharynx is clear and moist. No oropharyngeal exudate.  Eyes: Conjunctivae and EOM are normal. Pupils are equal, round, and reactive to light. Right eye exhibits no discharge. Left eye exhibits no discharge.  Neck: Neck supple.  Cardiovascular: Normal rate, regular rhythm, normal heart sounds and intact distal pulses.   Pulmonary/Chest: Effort normal. No respiratory distress. He has wheezes. He has rales. He exhibits no tenderness.  Abdominal: Soft. Bowel sounds are normal. He exhibits no distension and no mass. There is no tenderness. There is no rebound and no guarding.  Musculoskeletal: He exhibits no edema or tenderness.  No deformity or apparent injury  Neurological: No cranial nerve deficit.  Moderately confused with slurred speech. Responding with situational approriate comments.  Skin: Skin is warm and dry.    ED Course  Procedures (including critical care time) Labs Review Labs Reviewed   COMPREHENSIVE METABOLIC PANEL - Abnormal; Notable for the following:    Sodium 132 (*)    Chloride 92 (*)    Creatinine, Ser 2.56 (*)    GFR calc non Af Amer 27 (*)    GFR calc Af Amer 32 (*)    Anion gap 18 (*)    All other components within normal limits  CBC WITH DIFFERENTIAL - Abnormal; Notable for the following:    RDW 16.5 (*)    All other components within normal limits  BLOOD GAS, ARTERIAL - Abnormal; Notable for the following:    pO2, Arterial 79.0 (*)    Acid-base deficit 3.9 (*)    All other components within normal limits  URINALYSIS, ROUTINE W REFLEX MICROSCOPIC - Abnormal; Notable for the following:    APPearance CLOUDY (*)    All other components within normal limits  BASIC METABOLIC PANEL - Abnormal; Notable  for the following:    Sodium 132 (*)    Creatinine, Ser 2.01 (*)    GFR calc non Af Amer 36 (*)    GFR calc Af Amer 42 (*)    Anion gap 16 (*)    All other components within normal limits  CBC - Abnormal; Notable for the following:    Hemoglobin 12.5 (*)    HCT 38.0 (*)    RDW 16.4 (*)    All other components within normal limits  URINE RAPID DRUG SCREEN (HOSP PERFORMED) - Abnormal; Notable for the following:    Benzodiazepines POSITIVE (*)    All other components within normal limits  MRSA PCR SCREENING  ETHANOL  LIPASE, BLOOD  PRO B NATRIURETIC PEPTIDE  PROTIME-INR  AMMONIA  URINE RAPID DRUG SCREEN (HOSP PERFORMED)  I-STAT TROPOININ, ED  I-STAT CG4 LACTIC ACID, ED    Imaging Review Ct Head Wo Contrast  12/06/2013   CLINICAL DATA:  [place] yesterday, now presents with altered mental status, ethanol intoxication, history hypertension, COPD, asthma, seizures, smoking  EXAM: CT HEAD WITHOUT CONTRAST  TECHNIQUE: Contiguous axial images were obtained from the base of the skull through the vertex without intravenous contrast.  COMPARISON:  01/19/2013  FINDINGS: Motion artifacts despite repeating images, limiting exam.  Atrophy, greatest posteriorly.  Normal  ventricular morphology.  No midline shift or mass effect.  Within limitations imposed by motion, no gross evidence of intracranial hemorrhage, mass lesion or acute infarction.  No obvious extra-axial fluid collections.  Suboptimal osseous assessment, no gross abnormality seen.  Sinuses appear clear.  IMPRESSION: Examination limited by patient motion despite repeating images, with no gross acute intracranial abnormality identified.   Electronically Signed   By: Lavonia Dana M.D.   On: 12/06/2013 16:57   Dg Chest Port 1 View  12/06/2013   CLINICAL DATA:  Acute chest pain. Hypertension. History of smoking.  EXAM: PORTABLE CHEST - 1 VIEW  COMPARISON:  10/11/2013  FINDINGS: The cardiac silhouette, mediastinal and hilar contours are within normal limits and stable. Bibasilar scarring changes but no infiltrates, edema or effusions. The bony thorax is intact.  IMPRESSION: Bibasilar scarring but no acute pulmonary findings.   Electronically Signed   By: Kalman Jewels M.D.   On: 12/06/2013 16:51     EKG Interpretation   Date/Time:  Wednesday December 06 2013 15:53:18 EST Ventricular Rate:  74 PR Interval:  178 QRS Duration: 106 QT Interval:  425 QTC Calculation: 471 R Axis:   -13 Text Interpretation:  Sinus rhythm Abnormal R-wave progression, early  transition Left ventricular hypertrophy Baseline wander in lead(s) V2 V6  no stemi. III Twave change from old. abnormal Confirmed by Johnney Killian, MD,  Jeannie Done [phone]) on 12/06/2013 4:01:00 PM     Consult (18:53): Case reviewed with Dr. Arnoldo Morale hospitalist: need drug screen result before admission.  CRITICAL CARE Performed by: Charlesetta Shanks   Total critical care time: 40  Critical care time was exclusive of separately billable procedures and treating other patients.  Critical care was necessary to treat or prevent imminent or life-threatening deterioration.  Critical care was time spent personally by me on the following activities: development of  treatment plan with patient and/or surrogate as well as nursing, discussions with consultants, evaluation of patient's response to treatment, examination of patient, obtaining history from patient or surrogate, ordering and performing treatments and interventions, ordering and review of laboratory studies, ordering and review of radiographic studies, pulse oximetry and re-evaluation of patient's condition. MDM   Final  diagnoses:  Delirium  Polysubstance abuse  Alcoholism  Severe comorbid illness  Other chest pain   The patient is brought in with mental status change. Patient has multiple risk factors for medical illness or trauma. By report he is a chronic alcoholic and polysubstance abuser. In conjunction with this the patient has multiple medical comorbidities. The patient was evaluated for potential trauma with CT head which does not show any acute findings. The patient is not acutely intoxicated by alcohol screen. Consideration is given for alcohol withdrawal delirium, however at this time the patient is not exhibiting hypertension or tachycardia. Multivitamin, thiamine, folate infusion was initiated. This patient may also be at risk for Warneke encephalopathy. This has the potential for a drug ingestion versus metabolic type encephalopathy. The patient had some complaint of chest pain to his daughter as they were on route. At this point that there is not acute ischemia present on EKG and troponins are negative.    Charlesetta Shanks, MD 12/07/13 [phone]

## 2013-12-06 NOTE — H&P (Signed)
[facility] Admission History and Physical       Jesse Hartman YNW:[id number] DOB: 10/01/1960 DOA: 12/06/2013  Referring physician: EDP PCP: Chesley Noon, MD  Specialists:   Chief Complaint: Confusion  HPI: Jesse Hartman is a 53 y.o. male with a history of Alcohol Abuse, and Polysubstance Abuse who was brought to the ED  After he was found passed out in a neighbor's house by his daughter.   She reports that he lives alone and she checks in on him and could not find him, and one of his friends told her he was at a neighbors house.  He was unresponsive and in the ED became responsive and reports that he had taken pills, Xanax (  and Neurontin, and a" yellow pill." at the [place].   He reports that he drank 8-12 beers yesterday, and that he drinks 5-6 beers on a daily basis.   He denies any suicidal intention.      Review of Systems:  Constitutional: No Weight Loss, No Weight Gain, Night Sweats, Fevers, Chills, Dizziness, Fatigue, or Generalized Weakness HEENT: No Headaches, Difficulty Swallowing,Tooth/Dental Problems,Sore Throat,  No Sneezing, Rhinitis, Ear Ache, Nasal Congestion, or Post Nasal Drip,  Cardio-vascular:  No Chest pain, Orthopnea, PND, Edema in Lower Extremities, Anasarca, Dizziness, Palpitations  Resp: No Dyspnea, No DOE, No Productive Cough, No Non-Productive Cough, No Hemoptysis, No Wheezing.    GI: No Heartburn, Indigestion, Abdominal Pain, Nausea, Vomiting, Diarrhea, Hematemesis, Hematochezia, Melena, Change in Bowel Habits,  Loss of Appetite  GU: No Dysuria, Change in Color of Urine, No Urgency or Frequency, No Flank pain.  Musculoskeletal: No Joint Pain or Swelling, No Decreased Range of Motion, No Back Pain.  Neurologic: +Syncope, No Seizures, Muscle Weakness, Paresthesia, Vision Disturbance or Loss, No Diplopia, No Vertigo, No Difficulty Walking,  Skin: No Rash or Lesions. Psych: No Change in Mood or Affect, No Depression or Anxiety, No Memory loss,  +Confusion, or Hallucinations   Past Medical History  Diagnosis Date  . Hypertension   . Barrett's syndrome   . Arthritis   . Hypokalemia   . Hyponatremia   . Esophagitis   . GERD (gastroesophageal reflux disease)   . DVT (deep venous thrombosis)   . COPD (chronic obstructive pulmonary disease)   . Depression   . Substance abuse   . Hiatal Hernia 06/22/2013  . 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 Hypercalcemia   . Anxiety   . Asthma   . Blood transfusion without reported diagnosis   . Sleep apnea   . Seizures       Past Surgical History  Procedure Laterality Date  . Rhinoplasty    . Bunionectomy    . Appendectomy    . Esophagogastroduodenoscopy  08/03/2011    Procedure: ESOPHAGOGASTRODUODENOSCOPY (EGD);  Surgeon: Milus Banister, MD;  Location: [facility];  Service: Endoscopy;  Laterality: N/A;  . Finger surgery      lt middle  . Esophagogastroduodenoscopy N/A 01/24/2013    Procedure: ESOPHAGOGASTRODUODENOSCOPY (EGD);  Surgeon: Lafayette Dragon, MD;  Location: [facility] ENDOSCOPY;  Service: Endoscopy;  Laterality: N/A;  . Nasal sinus surgery    . Back surgery      L1 L2  . Esophagogastroduodenoscopy N/A 06/22/2013    Procedure: ESOPHAGOGASTRODUODENOSCOPY (EGD);  Surgeon: Gatha Mayer, MD;  Location: [facility];  Service: Endoscopy;  Laterality: N/A;       Prior to Admission medications   Medication Sig Start Date End Date Taking? Authorizing Provider  albuterol (PROVENTIL HFA;VENTOLIN  HFA) 108 (90 BASE) MCG/ACT inhaler Inhale 2 puffs into the lungs every 6 (six) hours as needed for wheezing or shortness of breath (copd).    Yes Historical Provider, MD  ALPRAZolam Duanne Moron) 1 MG tablet Take 1 mg by mouth daily as needed for anxiety (anxiety).   Yes Historical Provider, MD  amLODipine-olmesartan (AZOR) 10-40 MG per tablet Take 1 tablet by mouth every other day. Lot [id number]  Exp 04/06/2015 09/29/12  Yes Historical Provider, MD  budesonide-formoterol (SYMBICORT) 160-4.5 MCG/ACT  inhaler Inhale 2 puffs into the lungs daily.   Yes Historical Provider, MD  cyclobenzaprine (FLEXERIL) 10 MG tablet Take 1 tablet (10 mg total) by mouth 2 (two) times daily as needed for muscle spasms. 10/11/13  Yes Pura Spice, PA-C  escitalopram (LEXAPRO) 10 MG tablet Take 10 mg by mouth daily.   Yes Historical Provider, MD  metoprolol succinate (TOPROL-XL) 50 MG 24 hr tablet Take 50 mg by mouth daily. Take with or immediately following a meal.   Yes Historical Provider, MD  Omega-3 Fatty Acids (FISH OIL) 1000 MG CAPS Take 1,000 mg by mouth daily.   Yes Historical Provider, MD  pantoprazole (PROTONIX) 40 MG tablet Take 1 tablet (40 mg total) by mouth 2 (two) times daily. 08/01/13  Yes Irene Shipper, MD  Tiotropium Bromide Monohydrate (SPIRIVA RESPIMAT) 2.5 MCG/ACT AERS Inhale 1 capsule into the lungs 2 (two) times daily.   Yes Historical Provider, MD      Allergies  Allergen Reactions  . Food Hives    Yogurt      Social History:  reports that he has been smoking Cigarettes.  He has a 7.5 pack-year smoking history. He has never used smokeless tobacco. He reports that he drinks about 16.8 oz of alcohol per week. He reports that he uses illicit drugs.     Family History  Problem Relation Age of Onset  . Diabetes Maternal Grandfather   . Heart disease Father        Physical Exam:  GEN:  Obtunded  Well Developed 53 y.o. Caucasian male examined  and in no acute distress; cooperative with exam Filed Vitals:   12/06/13 1730 12/06/13 1800 12/06/13 1830 12/06/13 1900  BP: 153/96 141/95 153/116 156/112  Pulse: 84 96 79   Temp:      TempSrc:      Resp: 20 17 16 14   SpO2: 87% 90% 96%    Blood pressure 156/112, pulse 79, temperature 97.5 F (36.4 C), temperature source Oral, resp. rate 14, SpO2 96 %. PSYCH: He is alert and oriented x 2; does not appear anxious does not appear depressed; affect is normal HEENT: Normocephalic and Atraumatic, Mucous membranes pink; PERRLA; EOM intact;  Fundi:  Benign;  No scleral icterus, Nares: Patent, Oropharynx: Clear, Fair Dentition,    Neck:  FROM, No Cervical Lymphadenopathy nor Thyromegaly or Carotid Bruit; No JVD; Breasts:: Not examined CHEST WALL: No tenderness CHEST: Normal respiration, clear to auscultation bilaterally HEART: Regular rate and rhythm; no murmurs rubs or gallops BACK: No kyphosis or scoliosis; No CVA tenderness ABDOMEN: Positive Bowel Sounds, Soft Non-Tender; No Masses, No Organomegaly.   Rectal Exam: Not done EXTREMITIES: No Cyanosis, Clubbing, or Edema; No Ulcerations. Genitalia: not examined PULSES: 2+ and symmetric SKIN: Normal hydration no rash or ulceration CNS:  Alert and Oriented x 2,  Confused, Speech is Hesitant but clear,   No Focal Deficits Vascular: pulses palpable throughout    Labs on Admission:  Basic Metabolic Panel:  Recent Labs  Lab 12/06/13 1627  NA 132*  K 3.7  CL 92*  CO2 22  GLUCOSE 90  BUN 17  CREATININE 2.56*  CALCIUM 9.9   Liver Function Tests:  Recent Labs Lab 12/06/13 1627  AST 24  ALT 21  ALKPHOS 79  BILITOT 0.3  PROT 7.8  ALBUMIN 4.0    Recent Labs Lab 12/06/13 1627  LIPASE 24    Recent Labs Lab 12/06/13 1815  AMMONIA 11   CBC:  Recent Labs Lab 12/06/13 1627  WBC 7.2  NEUTROABS 4.7  HGB 13.8  HCT 41.7  MCV 81.3  PLT 263   Cardiac Enzymes: No results for input(s): CKTOTAL, CKMB, CKMBINDEX, TROPONINI in the last 168 hours.  BNP (last 3 results)  Recent Labs  12/06/13 1629  PROBNP 80.0   CBG: No results for input(s): GLUCAP in the last 168 hours.  Radiological Exams on Admission: Ct Head Wo Contrast  12/06/2013   CLINICAL DATA:  [place] yesterday, now presents with altered mental status, ethanol intoxication, history hypertension, COPD, asthma, seizures, smoking  EXAM: CT HEAD WITHOUT CONTRAST  TECHNIQUE: Contiguous axial images were obtained from the base of the skull through the vertex without intravenous contrast.  COMPARISON:   01/19/2013  FINDINGS: Motion artifacts despite repeating images, limiting exam.  Atrophy, greatest posteriorly.  Normal ventricular morphology.  No midline shift or mass effect.  Within limitations imposed by motion, no gross evidence of intracranial hemorrhage, mass lesion or acute infarction.  No obvious extra-axial fluid collections.  Suboptimal osseous assessment, no gross abnormality seen.  Sinuses appear clear.  IMPRESSION: Examination limited by patient motion despite repeating images, with no gross acute intracranial abnormality identified.   Electronically Signed   By: Lavonia Dana M.D.   On: 12/06/2013 16:57   Dg Chest Port 1 View  12/06/2013   CLINICAL DATA:  Acute chest pain. Hypertension. History of smoking.  EXAM: PORTABLE CHEST - 1 VIEW  COMPARISON:  10/11/2013  FINDINGS: The cardiac silhouette, mediastinal and hilar contours are within normal limits and stable. Bibasilar scarring changes but no infiltrates, edema or effusions. The bony thorax is intact.  IMPRESSION: Bibasilar scarring but no acute pulmonary findings.   Electronically Signed   By: Kalman Jewels M.D.   On: 12/06/2013 16:51     EKG: Independently reviewed.    Assessment/Plan:   53 y.o. male with  Principal Problem:   1.    Alcohol withdrawal-  ETOH Level    CIWA Protocol    Stepdown Monitoring   Active Problems:   2.   Overdose- suspected Overdose, most likely Recreational Abuse, However not clear of Intent   UDS ordered   Stepdown Monitoring   Neuro Checks   1:1 Sitter `  Suicide Precautions     3.   Delirium- due to #2   Neuro checks     4.   COPD (chronic obstructive pulmonary disease)   DuoNebs     5.   Alcoholism   CIWA Protocol     6.   AKI (acute kidney injury)- with CKD   IVFs   Monitor BUN/Cr     7.   DVT Prophylaxis   Lovenox     Code Status: FULL CODE    Family Communication:   Daughter at Bedside   Disposition Plan:     Inpatient Stepdown  Time spent:  [facility] Pager [phone]   If Hope Please Contact the Day Rounding Team MD for  [facility]  If 7PM-7AM, Please Contact Night-Floor Coverage  [email or web address] Password [facility] 12/06/2013, 8:39 PM

## 2013-12-07 ENCOUNTER — Inpatient Hospital Stay (HOSPITAL_COMMUNITY): Payer: Medicaid Other

## 2013-12-07 ENCOUNTER — Encounter (HOSPITAL_COMMUNITY): Payer: Self-pay | Admitting: *Deleted

## 2013-12-07 DIAGNOSIS — J9811 Atelectasis: Secondary | ICD-10-CM

## 2013-12-07 DIAGNOSIS — K2270 Barrett's esophagus without dysplasia: Secondary | ICD-10-CM

## 2013-12-07 DIAGNOSIS — M549 Dorsalgia, unspecified: Secondary | ICD-10-CM | POA: Diagnosis present

## 2013-12-07 DIAGNOSIS — N179 Acute kidney failure, unspecified: Secondary | ICD-10-CM

## 2013-12-07 DIAGNOSIS — G9340 Encephalopathy, unspecified: Secondary | ICD-10-CM

## 2013-12-07 DIAGNOSIS — R410 Disorientation, unspecified: Secondary | ICD-10-CM

## 2013-12-07 DIAGNOSIS — F10239 Alcohol dependence with withdrawal, unspecified: Secondary | ICD-10-CM

## 2013-12-07 DIAGNOSIS — F1910 Other psychoactive substance abuse, uncomplicated: Secondary | ICD-10-CM

## 2013-12-07 LAB — CBC
HCT: 38 % — ABNORMAL LOW (ref 39.0–52.0)
HEMOGLOBIN: 12.5 g/dL — AB (ref 13.0–17.0)
MCH: 27 pg (ref 26.0–34.0)
MCHC: 32.9 g/dL (ref 30.0–36.0)
MCV: 82.1 fL (ref 78.0–100.0)
Platelets: 235 10*3/uL (ref 150–400)
RBC: 4.63 MIL/uL (ref 4.22–5.81)
RDW: 16.4 % — ABNORMAL HIGH (ref 11.5–15.5)
WBC: 6.5 10*3/uL (ref 4.0–10.5)

## 2013-12-07 LAB — BASIC METABOLIC PANEL
Anion gap: 16 — ABNORMAL HIGH (ref 5–15)
BUN: 19 mg/dL (ref 6–23)
CALCIUM: 9.2 mg/dL (ref 8.4–10.5)
CO2: 20 mEq/L (ref 19–32)
Chloride: 96 mEq/L (ref 96–112)
Creatinine, Ser: 2.01 mg/dL — ABNORMAL HIGH (ref 0.50–1.35)
GFR calc Af Amer: 42 mL/min — ABNORMAL LOW (ref >90)
GFR calc non Af Amer: 36 mL/min — ABNORMAL LOW (ref >90)
GLUCOSE: 95 mg/dL (ref 70–99)
POTASSIUM: 3.9 meq/L (ref 3.7–5.3)
Sodium: 132 mEq/L — ABNORMAL LOW (ref 137–147)

## 2013-12-07 LAB — RAPID URINE DRUG SCREEN, HOSP PERFORMED
Amphetamines: NOT DETECTED
Barbiturates: NOT DETECTED
Benzodiazepines: NOT DETECTED
COCAINE: NOT DETECTED
OPIATES: NOT DETECTED
TETRAHYDROCANNABINOL: NOT DETECTED

## 2013-12-07 MED ORDER — DEXTROSE-NACL 5-0.9 % IV SOLN
INTRAVENOUS | Status: DC
  Administered 2013-12-07 – 2013-12-10 (×5): via INTRAVENOUS

## 2013-12-07 MED ORDER — ALBUTEROL SULFATE (2.5 MG/3ML) 0.083% IN NEBU
2.5000 mg | INHALATION_SOLUTION | RESPIRATORY_TRACT | Status: DC | PRN
  Administered 2013-12-11: 2.5 mg via RESPIRATORY_TRACT
  Filled 2013-12-07: qty 3

## 2013-12-07 MED ORDER — IPRATROPIUM-ALBUTEROL 0.5-2.5 (3) MG/3ML IN SOLN
3.0000 mL | Freq: Four times a day (QID) | RESPIRATORY_TRACT | Status: DC
  Administered 2013-12-07 – 2013-12-08 (×3): 3 mL via RESPIRATORY_TRACT
  Filled 2013-12-07 (×6): qty 3

## 2013-12-07 MED ORDER — PANTOPRAZOLE SODIUM 40 MG IV SOLR
40.0000 mg | Freq: Two times a day (BID) | INTRAVENOUS | Status: DC
  Administered 2013-12-07 – 2013-12-10 (×7): 40 mg via INTRAVENOUS
  Filled 2013-12-07 (×7): qty 40

## 2013-12-07 MED ORDER — FOLIC ACID 5 MG/ML IJ SOLN
1.0000 mg | Freq: Every day | INTRAMUSCULAR | Status: DC
  Administered 2013-12-07 – 2013-12-11 (×5): 1 mg via INTRAVENOUS
  Filled 2013-12-07 (×6): qty 0.2

## 2013-12-07 MED ORDER — THIAMINE HCL 100 MG/ML IJ SOLN
100.0000 mg | Freq: Every day | INTRAMUSCULAR | Status: DC
  Administered 2013-12-07 – 2013-12-11 (×5): 100 mg via INTRAVENOUS
  Filled 2013-12-07 (×5): qty 2

## 2013-12-07 MED ORDER — DEXMEDETOMIDINE HCL IN NACL 200 MCG/50ML IV SOLN
0.2000 ug/kg/h | INTRAVENOUS | Status: AC
  Administered 2013-12-07: 0.3 ug/kg/h via INTRAVENOUS
  Administered 2013-12-07: 1 ug/kg/h via INTRAVENOUS
  Administered 2013-12-07: 0.2 ug/kg/h via INTRAVENOUS
  Administered 2013-12-07 – 2013-12-08 (×5): 1 ug/kg/h via INTRAVENOUS
  Administered 2013-12-08: 1.2 ug/kg/h via INTRAVENOUS
  Administered 2013-12-08: 1 ug/kg/h via INTRAVENOUS
  Administered 2013-12-08: 1.2 ug/kg/h via INTRAVENOUS
  Filled 2013-12-07 (×18): qty 50

## 2013-12-07 NOTE — Progress Notes (Addendum)
[facility] Progress Note   [name] BSJ:[id number] DOB: Jan 06, 1960 DOA: 12/06/2013 PCP: Chesley Noon, MD  Brief narrative: Jesse Hartman is a 53 y.o. male with a history of alcohol and polysubstance abuse, COPD, hypertension, tobacco abuse, Barrett's esophagus, severe erosive esophagitis (January and again in June 2015), sleep apnea (does not use C Pap), depression and anxiety and a seizure which she believes was related to alcohol in the past. She was brought in by his daughter poorly responsive and admitted to taking excess doses of Xanax, Neurontin (which is not prescribed to him) and another "yellow" pill along with drinking 8-10 beers.  Subjective: Tells me he took 2  "blue tabs" of Xanax in the morning one or 2 a short while afterwards and 2 again later in the day. He believes he took about 10 Neurontin tablets-dose unknown and drank 2 24 ounce beers. He appears slightly confused. He does not have any suicidal intentions he states. He was not in any significant pain yesterday and nor is today. He admits to feeling lonely due to now having to live alone after many years. He does admit that he has issues with back pain at times that starts in the right lower back and radiates down his right leg but has not been having this lately. The pain has improved on the cyclobenzaprine that was recently prescribed to him (in the ER in early October) after a fall. He states he went to an inpatient alcohol rehabilitation program back in January.  Assessment/Plan: Principal Problem:   Acute encephalopathy-Alcohol withdrawal -Due to drug and alcohol use -appears to be improving now-he admits to me that he does not have any prescriptions for alprazolam at home -Continue to monitor in theSDU for drug and alcohol withdrawal   Active Problems: AKI (acute kidney injury) -May be related to above in setting of Hartman inhibitor use and possibly dehydration- appears to be ATN- urine output is  good -Baseline creatinine is less than 1 and he presented with a creatinine of 2.56 on admission -Hartman inhibitor on hold    HTN (hypertension) - slightly hypotensive -Amlodipine/Olmesartan and Metoprol on hold- watch for rebound tachycardia  Hyponatremia -Mild-possible dehydration versus due to beer drinking-continue normal saline and follow  Depression/anxiety -Have requested a psychiatry eval -Continue escitalopram    COPD (chronic obstructive pulmonary disease) - stable      Back pain - no current complaints- will request PT eval and assess for recurrence of pain -Hold cyclobenzaprine for now    Barrett's esophagus - cont BID PPI- recent EGD in 9/15 noted  Nicotine abuse - advised to quit- he is declining a nicotine patch   Code Status: full code Family Communication:  Disposition Plan: To be determined DVT prophylaxis: Lovenox  Consultants: Psych  Procedures: None  Antibiotics: Anti-infectives    None         Objective: Filed Weights   12/06/13 2146  Weight: 108.3 kg (238 lb 12.1 oz)    Intake/Output Summary (Last 24 hours) at 12/07/13 1151 Last data filed at 12/07/13 1120  Gross per 24 hour  Intake   1065 ml  Output   3200 ml  Net  -2135 ml     Vitals Filed Vitals:   12/07/13 0559 12/07/13 0600 12/07/13 0726 12/07/13 1000  BP:  104/62 116/73 108/70  Pulse: 81 64 88 79  Temp:   98.2 F (36.8 C)   TempSrc:   Oral   Resp: 17 14  12   Height:  Weight:      SpO2: 95% 96% 95% 94%    Exam: General: Awake and alert and communicative, mildly confused, No acute respiratory distress Lungs: Clear to auscultation bilaterally without wheezes or crackles Cardiovascular: Regular rate and rhythm without murmur gallop or rub normal S1 and S2 Abdomen: Nontender, nondistended, soft, bowel sounds positive, no rebound, no ascites, no appreciable mass Extremities: No significant cyanosis, clubbing, or edema bilateral lower extremities  Data  Reviewed: Basic Metabolic Panel:  Recent Labs Lab 12/06/13 1627 12/07/13 0406  NA 132* 132*  K 3.7 3.9  CL 92* 96  CO2 22 20  GLUCOSE 90 95  BUN 17 19  CREATININE 2.56* 2.01*  CALCIUM 9.9 9.2   Liver Function Tests:  Recent Labs Lab 12/06/13 1627  AST 24  ALT 21  ALKPHOS 79  BILITOT 0.3  PROT 7.8  ALBUMIN 4.0    Recent Labs Lab 12/06/13 1627  LIPASE 24    Recent Labs Lab 12/06/13 1815  AMMONIA 11   CBC:  Recent Labs Lab 12/06/13 1627 12/07/13 0406  WBC 7.2 6.5  NEUTROABS 4.7  --   HGB 13.8 12.5*  HCT 41.7 38.0*  MCV 81.3 82.1  PLT 263 235   Cardiac Enzymes: No results for input(s): CKTOTAL, CKMB, CKMBINDEX, TROPONINI in the last 168 hours. BNP (last 3 results)  Recent Labs  12/06/13 1629  PROBNP 80.0   CBG: No results for input(s): GLUCAP in the last 168 hours.  Recent Results (from the past 240 hour(s))  MRSA PCR Screening     Status: None   Collection Time: 12/06/13  9:43 PM  Result Value Ref Range Status   MRSA by PCR NEGATIVE NEGATIVE Final    Comment:        The GeneXpert MRSA Assay (FDA approved for NASAL specimens only), is one component of a comprehensive MRSA colonization surveillance program. It is not intended to diagnose MRSA infection nor to guide or monitor treatment for MRSA infections.      Studies:  Recent x-ray studies have been reviewed in detail by the Attending Physician  Scheduled Meds:  Scheduled Meds: . albuterol  2.5 mg Nebulization TID  . enoxaparin (LOVENOX) injection  40 mg Subcutaneous Q24H  . escitalopram  10 mg Oral Daily  . metoprolol succinate  50 mg Oral Daily  . pantoprazole  40 mg Oral BID  . tiotropium  18 mcg Inhalation Daily   Continuous Infusions: . sodium chloride      Time spent on care of this patient: 14 min   [place], MD 12/07/2013, 11:51 AM  LOS: 1 day   [facility] Office  [phone] Pager - Text Page per [email or web address]  If 7PM-7AM, please contact  night-coverage [email or web address]

## 2013-12-07 NOTE — Plan of Care (Signed)
Problem: Phase I Progression Outcomes Goal: Pain controlled with appropriate interventions Outcome: Progressing     

## 2013-12-07 NOTE — Progress Notes (Signed)
Patient continues to be agitated RASS of 4, hallucinations increasing and agitation causing to want to get out of bed. Text-paged MD Rizwan for wider parameters of Precedex.

## 2013-12-07 NOTE — Progress Notes (Signed)
Patient ID: Jesse Hartman, male   DOB: 1960-05-24, 53 y.o.   MRN: [id number]  Psychiatric consultation and psychiatric social service attempted for evaluation today without success because patient has been sedated/sleeping with a snore, safety sitter at bedside informed that patient has received shots little while ago. Patient was not able to be waken up with verbal stimuli. Patient will be followed up tomorrow. Psychiatric social service will follow up with the patient when it is appropriate  Reviewed the information documented.  Annamaria Salah,Jesse R. 12/07/2013 4:04 PM

## 2013-12-07 NOTE — Progress Notes (Signed)
PT Cancellation Note  Patient Details Name: Jesse Hartman MRN: [id number] DOB: [birth year]   Cancelled Treatment:    Reason Eval/Treat Not Completed: Medical issues which prohibited therapy (started on Precedex/agitation)   Claretha Cooper 12/07/2013, 2:28 PM 12/07/2013  M: T: W: Th: F: S: S:   Dx:

## 2013-12-07 NOTE — Progress Notes (Signed)
Clinical Social Work  CSW rounded with psych MD to complete evaluation. Patient unable to participate in assessment at this time. CSW will follow up at later time.  [place], [place] [phone]

## 2013-12-07 NOTE — Progress Notes (Signed)
Text-paged MD Rizwan about patient's status.

## 2013-12-07 NOTE — Progress Notes (Signed)
Patient having increase in hallucinations; tremors and anxiousness continue to be consistent. Patient making small remarks about wanting to get up. CIWA scores reflecting change of status. Will continue to monitor.

## 2013-12-07 NOTE — Consult Note (Signed)
PULMONARY / CRITICAL CARE MEDICINE   Name: Jesse Hartman MRN: [id number] DOB: [birth year]    ADMISSION DATE:  12/06/2013 CONSULTATION DATE:  12/07/2013  REFERRING MD : DT   CHIEF COMPLAINT:  Hurts all over  INITIAL PRESENTATION:  53 yo male presented with diffuse pain after passing out in neighbor's house.  He has hx of ETOH and polysubstance abuse.  He developed DT's and transferred to ICU.  STUDIES:  12/02 CT head >> no acute findings  SIGNIFICANT EVENTS: 12/02 Admit 12/03 to ICU, start precedex, psych consulted   HISTORY OF PRESENT ILLNESS:   53 yo male smoker was brought to ER after his daughter found him passed out at neighbors home.  He reports drinking at least 8 to 12 beers per day.  He also took several pills before passing out.  He was admitted to medical ward.  He developed hallucinations, tremors, and anxiousness.  He did not improved after getting ativan.  He was transferred to ICU to be started on precedex.  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  has past surgical history that includes Rhinoplasty; Bunionectomy; Appendectomy; Esophagogastroduodenoscopy (08/03/2011); Finger surgery; Esophagogastroduodenoscopy (N/A, 01/24/2013); Nasal sinus surgery; Back surgery; and Esophagogastroduodenoscopy (N/A, 06/22/2013). Prior to Admission medications   Medication Sig Start Date End Date Taking? Authorizing Provider  albuterol (PROVENTIL HFA;VENTOLIN HFA) 108 (90 BASE) MCG/ACT inhaler Inhale 2 puffs into the lungs every 6 (six) hours as needed for wheezing or shortness of breath  (copd).    Yes Historical Provider, MD  ALPRAZolam Duanne Moron) 1 MG tablet Take 1 mg by mouth daily as needed for anxiety (anxiety).   Yes Historical Provider, MD  amLODipine-olmesartan (AZOR) 10-40 MG per tablet Take 1 tablet by mouth every other day. Lot [id number]  Exp 04/06/2015 09/29/12  Yes Historical Provider, MD  budesonide-formoterol (SYMBICORT) 160-4.5 MCG/ACT inhaler Inhale 2 puffs into the lungs daily.   Yes Historical Provider, MD  cyclobenzaprine (FLEXERIL) 10 MG tablet Take 1 tablet (10 mg total) by mouth 2 (two) times daily as needed for muscle spasms. 10/11/13  Yes Pura Spice, PA-C  escitalopram (LEXAPRO) 10 MG tablet Take 10 mg by mouth daily.   Yes Historical Provider, MD  metoprolol succinate (TOPROL-XL) 50 MG 24 hr tablet Take 50 mg by mouth daily. Take with or immediately following a meal.   Yes Historical Provider, MD  Omega-3 Fatty Acids (FISH OIL) 1000 MG CAPS Take 1,000 mg by mouth daily.   Yes Historical Provider, MD  pantoprazole (PROTONIX) 40 MG tablet Take 1 tablet (40 mg total) by mouth 2 (two) times daily. 08/01/13  Yes Irene Shipper, MD  Tiotropium Bromide Monohydrate (SPIRIVA RESPIMAT) 2.5 MCG/ACT AERS Inhale 1 capsule into the lungs 2 (two) times daily.   Yes Historical Provider, MD   Allergies  Allergen Reactions  . Food Hives    Yogurt     FAMILY HISTORY:  has no family status information on file.  SOCIAL HISTORY:  reports that he has been smoking Cigarettes.  He has a 7.5 pack-year smoking history. He has never used smokeless tobacco. He reports that he drinks about 16.8 oz of alcohol per week. He reports that he uses illicit drugs.  REVIEW OF SYSTEMS:  Unable to obtain  SUBJECTIVE:   VITAL SIGNS: Temp:  [97.9 F (36.6 C)-99.4 F (37.4 C)] 99.2 F (37.3 C) (12/03 1600) Pulse Rate:  [57-122] 73 (12/03 1700) Resp:  [12-37] 32 (12/03 1700) BP: (80-156)/(58-116) 140/90 mmHg (12/03 1700) SpO2:  [82 %-98 %] 92 % (12/03 1700) Weight:  [238 lb 12.1 oz  (108.3 kg)] 238 lb 12.1 oz (108.3 kg) (12/02 2146) INTAKE / OUTPUT:  Intake/Output Summary (Last 24 hours) at 12/07/13 1754 Last data filed at 12/07/13 1526  Gross per 24 hour  Intake 1316.7 ml  Output   3700 ml  Net -2383.3 ml    PHYSICAL EXAMINATION: General: no distress Neuro:  RASS -3 HEENT:  Pupils reactive, poor dentition Cardiovascular:  Regular, no murmur Lungs:  No wheeze/rales Abdomen:  Soft, non tender, + bowel sounds Musculoskeletal:  No edema Skin:  No rashes  LABS:  CBC  Recent Labs Lab 12/06/13 1627 12/07/13 0406  WBC 7.2 6.5  HGB 13.8 12.5*  HCT 41.7 38.0*  PLT 263 235   Coag's  Recent Labs Lab 12/06/13 1627  INR 1.00   BMET  Recent Labs Lab 12/06/13 1627 12/07/13 0406  NA 132* 132*  K 3.7 3.9  CL 92* 96  CO2 22 20  BUN 17 19  CREATININE 2.56* 2.01*  GLUCOSE 90 95   Electrolytes  Recent Labs Lab 12/06/13 1627 12/07/13 0406  CALCIUM 9.9 9.2   Sepsis Markers  Recent Labs Lab 12/06/13 1824  LATICACIDVEN 0.90   ABG  Recent Labs Lab 12/06/13 1614  PHART 7.373  PCO2ART 35.5  PO2ART 79.0*   Liver Enzymes  Recent Labs Lab 12/06/13 1627  AST 24  ALT 21  ALKPHOS 79  BILITOT 0.3  ALBUMIN 4.0   Cardiac Enzymes  Recent Labs Lab 12/06/13 1629  PROBNP 80.0   Glucose No results for input(s): GLUCAP in the last 168 hours.  Imaging Ct Head Wo Contrast  12/06/2013   CLINICAL DATA:  [place] yesterday, now presents with altered mental status, ethanol intoxication, history hypertension, COPD, asthma, seizures, smoking  EXAM: CT HEAD WITHOUT CONTRAST  TECHNIQUE: Contiguous axial images were obtained from the base of the skull through the vertex without intravenous contrast.  COMPARISON:  01/19/2013  FINDINGS: Motion artifacts despite repeating images, limiting exam.  Atrophy, greatest posteriorly.  Normal ventricular morphology.  No midline shift or mass effect.  Within limitations imposed by motion, no gross evidence of  intracranial hemorrhage, mass lesion or acute infarction.  No obvious extra-axial fluid collections.  Suboptimal osseous assessment, no gross abnormality seen.  Sinuses appear clear.  IMPRESSION: Examination limited by patient motion despite repeating images, with no gross acute intracranial abnormality identified.   Electronically Signed   By: Lavonia Dana M.D.   On: 12/06/2013 16:57   Dg Chest Port 1 View  12/06/2013   CLINICAL DATA:  Acute chest pain. Hypertension. History of smoking.  EXAM: PORTABLE CHEST - 1 VIEW  COMPARISON:  10/11/2013  FINDINGS: The cardiac silhouette, mediastinal and hilar contours are within normal limits and stable. Bibasilar scarring changes but no infiltrates, edema or effusions. The bony thorax is intact.  IMPRESSION: Bibasilar scarring but no acute pulmonary findings.   Electronically Signed   By: Kalman Jewels M.D.   On: 12/06/2013 16:51     ASSESSMENT / PLAN: NEUROLOGIC A:   Acute encephalopathy 2nd to alcohol withdrawal with delirium tremens. Depression, anxiety. Chronic back pain. P:   RASS goal: -1 Continue precedex Thiamine, folic acid Prn dilaudid  Hold lexapro until able to take oral meds  PULMONARY A: Hx of COPD, OSA, tobacco abuse. P:   Change to nebulized BD's until more stable Hold spiriva, symbicort for now Oxygen to keep SpO2 > 92% F/u CXR  Bronchial hygiene Smoking cessation BiPAP prn  CARDIOVASCULAR A:  Hx of HTN. P:  Hold outpt anti-HTN meds for now until hemodynamics stable  RENAL A:   Acute kidney injury >> likely from volume depletion. Hyponatremia. P:   Continue IV fluids Monitor renal fx, urine outpt, electrolytes  GASTROINTESTINAL A:   Hx of Barrett's Esophagus. Nutrition. P:   Protonix BID NPO until mental status improved  HEMATOLOGIC A:   No acute issues. P:  F/u CBC Lovenox for DVT prevention  INFECTIOUS A:   No evidence for infection. P:   Monitor clinically for signs of  aspiration  ENDOCRINE A:   No acute issues. P:   Monitor blood sugar on BMET   FAMILY  - Updates: No family at bedside 12/03.  - Inter-disciplinary family meet or Palliative Care meeting due by 12/10:     TODAY'S SUMMARY:  53 yo with DT's now on precedex.  Will need to monitor for respiratory failure/aspiration.  CC time 40 minutes.     Pulmonary and [place] Pager: [phone]  12/07/2013, 5:54 PM

## 2013-12-08 DIAGNOSIS — F1024 Alcohol dependence with alcohol-induced mood disorder: Secondary | ICD-10-CM

## 2013-12-08 DIAGNOSIS — T50904D Poisoning by unspecified drugs, medicaments and biological substances, undetermined, subsequent encounter: Secondary | ICD-10-CM

## 2013-12-08 LAB — COMPREHENSIVE METABOLIC PANEL
ALBUMIN: 3.4 g/dL — AB (ref 3.5–5.2)
ALK PHOS: 67 U/L (ref 39–117)
ALT: 17 U/L (ref 0–53)
AST: 20 U/L (ref 0–37)
Anion gap: 11 (ref 5–15)
BILIRUBIN TOTAL: 0.3 mg/dL (ref 0.3–1.2)
BUN: 14 mg/dL (ref 6–23)
CHLORIDE: 104 meq/L (ref 96–112)
CO2: 23 meq/L (ref 19–32)
CREATININE: 1.03 mg/dL (ref 0.50–1.35)
Calcium: 9.7 mg/dL (ref 8.4–10.5)
GFR calc non Af Amer: 82 mL/min — ABNORMAL LOW (ref >90)
GLUCOSE: 116 mg/dL — AB (ref 70–99)
POTASSIUM: 4.6 meq/L (ref 3.7–5.3)
Sodium: 138 mEq/L (ref 137–147)
Total Protein: 6.7 g/dL (ref 6.0–8.3)

## 2013-12-08 LAB — CBC
HCT: 40.2 % (ref 39.0–52.0)
Hemoglobin: 13 g/dL (ref 13.0–17.0)
MCH: 26.5 pg (ref 26.0–34.0)
MCHC: 32.3 g/dL (ref 30.0–36.0)
MCV: 81.9 fL (ref 78.0–100.0)
PLATELETS: 242 10*3/uL (ref 150–400)
RBC: 4.91 MIL/uL (ref 4.22–5.81)
RDW: 16.3 % — ABNORMAL HIGH (ref 11.5–15.5)
WBC: 6.9 10*3/uL (ref 4.0–10.5)

## 2013-12-08 LAB — BLOOD GAS, ARTERIAL
ACID-BASE EXCESS: 1.7 mmol/L (ref 0.0–2.0)
Bicarbonate: 24.6 mEq/L — ABNORMAL HIGH (ref 20.0–24.0)
DRAWN BY: 270211
O2 CONTENT: 2 L/min
O2 Saturation: 93.6 %
PCO2 ART: 34.5 mmHg — AB (ref 35.0–45.0)
PH ART: 7.466 — AB (ref 7.350–7.450)
Patient temperature: 98.6
TCO2: 21.5 mmol/L (ref 0–100)
pO2, Arterial: 69.2 mmHg — ABNORMAL LOW (ref 80.0–100.0)

## 2013-12-08 LAB — MAGNESIUM: Magnesium: 2.2 mg/dL (ref 1.5–2.5)

## 2013-12-08 LAB — PHOSPHORUS: Phosphorus: 2.1 mg/dL — ABNORMAL LOW (ref 2.3–4.6)

## 2013-12-08 MED ORDER — HYDRALAZINE HCL 20 MG/ML IJ SOLN
INTRAMUSCULAR | Status: AC
  Filled 2013-12-08: qty 1

## 2013-12-08 MED ORDER — HYDRALAZINE HCL 20 MG/ML IJ SOLN
10.0000 mg | INTRAMUSCULAR | Status: DC | PRN
  Administered 2013-12-08 – 2013-12-12 (×10): 10 mg via INTRAVENOUS
  Filled 2013-12-08 (×6): qty 1

## 2013-12-08 MED ORDER — DEXMEDETOMIDINE HCL IN NACL 400 MCG/100ML IV SOLN
INTRAVENOUS | Status: AC
  Filled 2013-12-08: qty 100

## 2013-12-08 MED ORDER — IPRATROPIUM-ALBUTEROL 0.5-2.5 (3) MG/3ML IN SOLN
3.0000 mL | Freq: Four times a day (QID) | RESPIRATORY_TRACT | Status: DC
  Administered 2013-12-08 – 2013-12-12 (×16): 3 mL via RESPIRATORY_TRACT
  Filled 2013-12-08 (×13): qty 3

## 2013-12-08 MED ORDER — CHLORHEXIDINE GLUCONATE 0.12 % MT SOLN
OROMUCOSAL | Status: AC
  Filled 2013-12-08: qty 15

## 2013-12-08 MED ORDER — DEXMEDETOMIDINE HCL IN NACL 400 MCG/100ML IV SOLN
0.2000 ug/kg/h | INTRAVENOUS | Status: DC
  Administered 2013-12-08: 1.6 ug/kg/h via INTRAVENOUS
  Administered 2013-12-08 (×2): 1.7 ug/kg/h via INTRAVENOUS
  Administered 2013-12-08: 1.6 ug/kg/h via INTRAVENOUS
  Administered 2013-12-08 (×3): 1.7 ug/kg/h via INTRAVENOUS
  Administered 2013-12-08: 1.6 ug/kg/h via INTRAVENOUS
  Administered 2013-12-09 (×5): 1.7 ug/kg/h via INTRAVENOUS

## 2013-12-08 MED ORDER — DEXMEDETOMIDINE HCL IN NACL 200 MCG/50ML IV SOLN
INTRAVENOUS | Status: AC
  Filled 2013-12-08: qty 50

## 2013-12-08 MED ORDER — CHLORHEXIDINE GLUCONATE 0.12 % MT SOLN
15.0000 mL | Freq: Two times a day (BID) | OROMUCOSAL | Status: DC
  Administered 2013-12-08 – 2013-12-09 (×3): 15 mL via OROMUCOSAL

## 2013-12-08 MED ORDER — CETYLPYRIDINIUM CHLORIDE 0.05 % MT LIQD
7.0000 mL | Freq: Two times a day (BID) | OROMUCOSAL | Status: DC
  Administered 2013-12-08 – 2013-12-11 (×6): 7 mL via OROMUCOSAL

## 2013-12-08 MED ORDER — INFLUENZA VAC SPLIT QUAD 0.5 ML IM SUSY
0.5000 mL | PREFILLED_SYRINGE | INTRAMUSCULAR | Status: DC
  Filled 2013-12-08: qty 0.5

## 2013-12-08 NOTE — Progress Notes (Addendum)
During assessment, patient began to become paranoid, requesting to know where his phone and wallet was and if it was safe. Patient's hands in fists and appearing tense. Increased precedex drip from 1 mcg to 1.2. Patient is beginning to relax in muscle tension and responding well to increase in dose. Vital signs stable.

## 2013-12-08 NOTE — Progress Notes (Signed)
PULMONARY / CRITICAL CARE MEDICINE   Name: Jesse Hartman MRN: [id number] DOB: [birth year]    ADMISSION DATE:  12/06/2013 CONSULTATION DATE:  12/07/2013  REFERRING MD : DT   CHIEF COMPLAINT:  "Hurts all over"  INITIAL PRESENTATION:  53 yo male presented with diffuse pain after passing out in neighbor's house.  He has hx of ETOH and polysubstance abuse.  He developed DT's and transferred to ICU 12/3  STUDIES:  12/02 CT head >> no acute findings  SIGNIFICANT EVENTS: 12/02 Admit 12/03 to ICU, start precedex, psych consulted  SUBJECTIVE:  Sleepy but wakes to voice C/o being very thirsty  VITAL SIGNS: Temp:  [97.3 F (36.3 C)-99.4 F (37.4 C)] 97.3 F (36.3 C) (12/04 0800) Pulse Rate:  [60-81] 60 (12/04 0800) Resp:  [21-44] 28 (12/04 0800) BP: (116-183)/(59-98) 181/96 mmHg (12/04 1022) SpO2:  [90 %-97 %] 97 % (12/04 1030) Weight:  [106.2 kg (234 lb 2.1 oz)] 106.2 kg (234 lb 2.1 oz) (12/04 0400) INTAKE / OUTPUT:  Intake/Output Summary (Last 24 hours) at 12/08/13 1056 Last data filed at 12/08/13 1000  Gross per 24 hour  Intake 1793.29 ml  Output   3100 ml  Net -1306.71 ml    PHYSICAL EXAMINATION: General: no distress Neuro:  RASS -1, wakes to voice, slightly slurred speech, non-focal exam, no tremor HEENT:  Pupils reactive, poor dentition Cardiovascular:  Regular, no murmur Lungs:  No wheezes or crackles Abdomen:  Soft, non tender, + bowel sounds Musculoskeletal:  No edema Skin:  No rashes  LABS:  CBC  Recent Labs Lab 12/06/13 1627 12/07/13 0406 12/08/13 0438  WBC 7.2 6.5 6.9  HGB 13.8 12.5* 13.0  HCT 41.7 38.0* 40.2  PLT 263 235 242   Coag's  Recent Labs Lab 12/06/13 1627  INR 1.00   BMET  Recent Labs Lab 12/06/13 1627 12/07/13 0406 12/08/13 0438  NA 132* 132* 138  K 3.7 3.9 4.6  CL 92* 96 104  CO2 22 20 23   BUN 17 19 14   CREATININE 2.56* 2.01* 1.03  GLUCOSE 90 95 116*   Electrolytes  Recent Labs Lab 12/06/13 1627 12/07/13 0406  12/08/13 0438  CALCIUM 9.9 9.2 9.7  MG  --   --  2.2  PHOS  --   --  2.1*   Sepsis Markers  Recent Labs Lab 12/06/13 1824  LATICACIDVEN 0.90   ABG  Recent Labs Lab 12/06/13 1614 12/08/13 0852  PHART 7.373 7.466*  PCO2ART 35.5 34.5*  PO2ART 79.0* 69.2*   Liver Enzymes  Recent Labs Lab 12/06/13 1627 12/08/13 0438  AST 24 20  ALT 21 17  ALKPHOS 79 67  BILITOT 0.3 0.3  ALBUMIN 4.0 3.4*   Cardiac Enzymes  Recent Labs Lab 12/06/13 1629  PROBNP 80.0   Glucose No results for input(s): GLUCAP in the last 168 hours.  Imaging Dg Chest Port 1 View  12/07/2013   CLINICAL DATA:  Atelectasis, history hypertension, GERD, asthma  EXAM: PORTABLE CHEST - 1 VIEW  COMPARISON:  Portable exam 8502 hr compared to 12/06/2013  FINDINGS: Borderline enlargement of cardiac silhouette.  Rotated to the LEFT.  Mediastinal contours and pulmonary vascularity normal.  Bronchitic changes with increased bibasilar atelectasis since previous study.  No gross infiltrate identified.  Small LEFT pleural effusion.  LEFT clavicular deformity from old fracture.  IMPRESSION: Bronchitic changes with increased bibasilar atelectasis and small LEFT pleural effusion.   Electronically Signed   By: Lavonia Dana M.D.   On: 12/07/2013 18:27  ASSESSMENT / PLAN: NEUROLOGIC A:   Acute encephalopathy 2nd to alcohol withdrawal with delirium tremens. Depression, anxiety. Chronic back pain. P:   RASS goal: -1 to 0 Continue precedex Thiamine, folic acid Prn dilaudid Hold lexapro until able to take oral meds  PULMONARY A: Hx of COPD, OSA, tobacco abuse. P:   Change to nebulized BD's until more stable Hold spiriva, symbicort for now Oxygen to keep SpO2 > 92% F/u CXR  Bronchial hygiene Smoking cessation BiPAP prn > has not required  CARDIOVASCULAR A:  Hx of HTN. P:  Hold outpt anti-HTN meds for now until better able to take PO  RENAL A:   Acute kidney injury >> likely from volume  depletion. Hyponatremia. P:   Continue IV fluids Monitor renal fx, urine outpt, electrolytes  GASTROINTESTINAL A:   Hx of Barrett's Esophagus. Nutrition. P:   Protonix BID NPO until mental status improved  HEMATOLOGIC A:   No acute issues. P:  F/u CBC Lovenox for DVT prevention  INFECTIOUS A:   No evidence for infection. P:   Monitor clinically for signs of aspiration  ENDOCRINE A:   No acute issues. P:   Monitor blood sugar on BMET   FAMILY  - Updates: No family at bedside 12/03.  - Inter-disciplinary family meet or Palliative Care meeting due by 12/10:     TODAY'S SUMMARY:  53 yo with DT's on precedex.  Follow for resolution MS, wean as able. At some risk for resp failure but doing well at this time.   CC time 35 minutes.   Baltazar Apo, MD, PhD 12/08/2013, 11:03 AM [facility] [phone] or if no answer [phone]

## 2013-12-08 NOTE — Consult Note (Signed)
[facility] Face-to-Face Psychiatry Consult   Reason for Consult:  Depression, intentional overdose and substance abuse Referring Physician:  Dr. Salena Saner is an 53 y.o. male. Total Time spent with patient: 45 minutes  Assessment: AXIS I:  Substance Induced Mood Disorder and Alcohol withdrawal  syndrome and alcohol dependence  AXIS II:  Deferred AXIS III:   Past Medical History  Diagnosis Date  . Hypertension   . Barrett's syndrome   . Arthritis   . Hypokalemia   . Hyponatremia   . Esophagitis   . GERD (gastroesophageal reflux disease)   . DVT (deep venous thrombosis)   . COPD (chronic obstructive pulmonary disease)   . Depression   . Substance abuse   . Hiatal Hernia 06/22/2013  . 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 Hypercalcemia   . Anxiety   . Asthma   . Blood transfusion without reported diagnosis   . Sleep apnea   . Seizures    AXIS IV:  economic problems, occupational problems, other psychosocial or environmental problems, problems related to social environment and problems with primary support group AXIS V:  31-40 impairment in reality testing  Plan:  Patient may need involuntary commitment if he tried to leave the hospital today as he has no insight about 2 mL twice a day of his current clinical condition. No evidence of imminent risk to self or others at present.   Patient does not meet criteria for psychiatric inpatient admission. Supportive therapy provided about ongoing stressors. Refer to IOP. Appreciate psychiatric consultation Please contact [phone] or [phone] if needs further assistance   Subjective:   Jesse Hartman is a 53 y.o. male patient admitted with Depression, intentional overdose and substance abuse.  HPI:  Patient seen, chart reviewed and case discussed with staff RN and Unk Lightning, LCSW. Patient has been suffering with depression, anxiety, alcohol abuse versus dependence. Patient reportedly ran out of his money and not able to drink  alcohol since Sunday. Reportedly patient was taken somebody else medication as a overdose which is intentional. Patient denies intention to harm himself or others. Patient reported his father is a 75 years old in [place] send check every month which he has been using since 2011. Patient has a 2 daughters in [place] who seems to be supportive to him. Patient has a sister in the [place] who is a Art therapist patient provided consent to speak with his daughter and his sister but not with his father. Patient denies auditory or visual hallucination, delusions and paranoia. Patient stated he feels that he is ready to be discharged and need to take care of his utilities and rent, and he did not realize he is in ICU with the Precedex drip. Patient was informed he is not ready to be discharged at this time and also offered substance abuse rehabilitation treatment which he refuses at this time. As per the chart patient was refusing rehabilitation treatment even in the past. Patient reported he has a multiple medical problems and surgeries in the past and he lost his ability to work since 2011 and has been receiving outpatient medication management [place]. Patient to urine drug screen is positive for benzodiazepine.  Medical history: Jesse Hartman is a 53 y.o. male with a history of Alcohol Abuse, and Polysubstance Abuse who was brought to the ED After he was found passed out in a neighbor's house by his daughter. She reports that he lives alone and she checks  in on him and could not find him, and one of his friends told her he was at a neighbors house. He was unresponsive and in the ED became responsive and reports that he had taken pills, Xanax ( and Neurontin, and a" yellow pill." at the [place]. He reports that he drank 8-12 beers yesterday, and that he drinks 5-6 beers on a daily basis. He denies any suicidal intention.    Review of Systems:   Constitutional: No Weight Loss, No Weight Gain, Night Sweats, Fevers, Chills, Dizziness, Fatigue, or Generalized Weakness HEENT: No Headaches, Difficulty Swallowing,Tooth/Dental Problems,Sore Throat,  No Sneezing, Rhinitis, Ear Ache, Nasal Congestion, or Post Nasal Drip,  Cardio-vascular: No Chest pain, Orthopnea, PND, Edema in Lower Extremities, Anasarca, Dizziness, Palpitations  Resp: No Dyspnea, No DOE, No Productive Cough, No Non-Productive Cough, No Hemoptysis, No Wheezing.  GI: No Heartburn, Indigestion, Abdominal Pain, Nausea, Vomiting, Diarrhea, Hematemesis, Hematochezia, Melena, Change in Bowel Habits, Loss of Appetite  GU: No Dysuria, Change in Color of Urine, No Urgency or Frequency, No Flank pain.  Musculoskeletal: No Joint Pain or Swelling, No Decreased Range of Motion, No Back Pain.  Neurologic: +Syncope, No Seizures, Muscle Weakness, Paresthesia, Vision Disturbance or Loss, No Diplopia, No Vertigo, No Difficulty Walking,  Skin: No Rash or Lesions. Psych: No Change in Mood or Affect, No Depression or Anxiety, No Memory loss, +Confusion, or Hallucinations HPI Elements:   Location:  Alcohol withdrawal . Quality:  Poor, presented with altered mental status. Severity:  Status post overdose of medication. Timing:  Ran out of money to drink alcohol.  Past Psychiatric History: Past Medical History  Diagnosis Date  . Hypertension   . Barrett's syndrome   . Arthritis   . Hypokalemia   . Hyponatremia   . Esophagitis   . GERD (gastroesophageal reflux disease)   . DVT (deep venous thrombosis)   . COPD (chronic obstructive pulmonary disease)   . Depression   . Substance abuse   . Hiatal Hernia 06/22/2013  . 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 Hypercalcemia   . Anxiety   . Asthma   . Blood transfusion without reported diagnosis   . Sleep apnea   . Seizures     reports that he has been smoking Cigarettes.  He has a 7.5 pack-year smoking history. He has never used  smokeless tobacco. He reports that he drinks about 16.8 oz of alcohol per week. He reports that he uses illicit drugs. Family History  Problem Relation Age of Onset  . Diabetes Maternal Grandfather   . Heart disease Father      Living Arrangements: Children   Abuse/Neglect [facility]) Physical Abuse: Denies Verbal Abuse: Denies Sexual Abuse: Denies Allergies:   Allergies  Allergen Reactions  . Food Hives    Yogurt     ACT Assessment Complete:  No Objective: Blood pressure 127/75, pulse 60, temperature 97.3 F (36.3 C), temperature source Oral, resp. rate 22, height $RemoveBe'6\' 2"'PtUwgYLhu$  (1.88 m), weight 106.2 kg (234 lb 2.1 oz), SpO2 93 %.Body mass index is 30.05 kg/(m^2). Results for orders placed or performed during the hospital encounter of 12/06/13 (from the past 72 hour(s))  Blood gas, arterial     Status: Abnormal   Collection Time: 12/06/13  4:14 PM  Result Value Ref Range   FIO2 0.21 %   pH, Arterial 7.373 7.350 - 7.450   pCO2 arterial 35.5 35.0 - 45.0 mmHg   pO2, Arterial 79.0 (L) 80.0 - 100.0 mmHg   Bicarbonate 20.2 20.0 - 24.0  mEq/L   TCO2 17.9 0 - 100 mmol/L   Acid-base deficit 3.9 (H) 0.0 - 2.0 mmol/L   O2 Saturation 95.1 %   Patient temperature 98.6    Collection site BRACHIAL ARTERY    Drawn by [id number]    Sample type ARTERIAL DRAW   Comprehensive metabolic panel     Status: Abnormal   Collection Time: 12/06/13  4:27 PM  Result Value Ref Range   Sodium 132 (L) 137 - 147 mEq/L   Potassium 3.7 3.7 - 5.3 mEq/L   Chloride 92 (L) 96 - 112 mEq/L   CO2 22 19 - 32 mEq/L   Glucose, Bld 90 70 - 99 mg/dL   BUN 17 6 - 23 mg/dL   Creatinine, Ser 2.56 (H) 0.50 - 1.35 mg/dL   Calcium 9.9 8.4 - 10.5 mg/dL   Total Protein 7.8 6.0 - 8.3 g/dL   Albumin 4.0 3.5 - 5.2 g/dL   AST 24 0 - 37 U/L   ALT 21 0 - 53 U/L   Alkaline Phosphatase 79 39 - 117 U/L   Total Bilirubin 0.3 0.3 - 1.2 mg/dL   GFR calc non Af Amer 27 (L) >90 mL/min   GFR calc Af Amer 32 (L) >90 mL/min    Comment: (NOTE) The  eGFR has been calculated using the CKD EPI equation. This calculation has not been validated in all clinical situations. eGFR's persistently <90 mL/min signify possible Chronic Kidney Disease.    Anion gap 18 (H) 5 - 15  Ethanol     Status: None   Collection Time: 12/06/13  4:27 PM  Result Value Ref Range   Alcohol, Ethyl (B) <11 0 - 11 mg/dL    Comment:        LOWEST DETECTABLE LIMIT FOR SERUM ALCOHOL IS 11 mg/dL FOR MEDICAL PURPOSES ONLY   Lipase, blood     Status: None   Collection Time: 12/06/13  4:27 PM  Result Value Ref Range   Lipase 24 11 - 59 U/L  CBC with Differential     Status: Abnormal   Collection Time: 12/06/13  4:27 PM  Result Value Ref Range   WBC 7.2 4.0 - 10.5 K/uL   RBC 5.13 4.22 - 5.81 MIL/uL   Hemoglobin 13.8 13.0 - 17.0 g/dL   HCT 41.7 39.0 - 52.0 %   MCV 81.3 78.0 - 100.0 fL   MCH 26.9 26.0 - 34.0 pg   MCHC 33.1 30.0 - 36.0 g/dL   RDW 16.5 (H) 11.5 - 15.5 %   Platelets 263 150 - 400 K/uL   Neutrophils Relative % 66 43 - 77 %   Neutro Abs 4.7 1.7 - 7.7 K/uL   Lymphocytes Relative 25 12 - 46 %   Lymphs Abs 1.8 0.7 - 4.0 K/uL   Monocytes Relative 8 3 - 12 %   Monocytes Absolute 0.6 0.1 - 1.0 K/uL   Eosinophils Relative 1 0 - 5 %   Eosinophils Absolute 0.1 0.0 - 0.7 K/uL   Basophils Relative 0 0 - 1 %   Basophils Absolute 0.0 0.0 - 0.1 K/uL  Protime-INR     Status: None   Collection Time: 12/06/13  4:27 PM  Result Value Ref Range   Prothrombin Time 13.3 11.6 - 15.2 seconds   INR 1.00 0.00 - 1.49  Pro b natriuretic peptide (BNP)     Status: None   Collection Time: 12/06/13  4:29 PM  Result Value Ref Range   Pro B Natriuretic  peptide (BNP) 80.0 0 - 125 pg/mL  I-stat troponin, ED     Status: None   Collection Time: 12/06/13  4:29 PM  Result Value Ref Range   Troponin i, poc 0.00 0.00 - 0.08 ng/mL   Comment 3            Comment: Due to the release kinetics of cTnI, a negative result within the first hours of the onset of symptoms does not rule  out myocardial infarction with certainty. If myocardial infarction is still suspected, repeat the test at appropriate intervals.   Ammonia     Status: None   Collection Time: 12/06/13  6:15 PM  Result Value Ref Range   Ammonia 11 11 - 60 umol/L  I-Stat CG4 Lactic Acid, ED     Status: None   Collection Time: 12/06/13  6:24 PM  Result Value Ref Range   Lactic Acid, Venous 0.90 0.5 - 2.2 mmol/L  Urinalysis, Routine w reflex microscopic     Status: Abnormal   Collection Time: 12/06/13  9:06 PM  Result Value Ref Range   Color, Urine YELLOW YELLOW   APPearance CLOUDY (A) CLEAR   Specific Gravity, Urine 1.005 1.005 - 1.030   pH 5.0 5.0 - 8.0   Glucose, UA NEGATIVE NEGATIVE mg/dL   Hgb urine dipstick NEGATIVE NEGATIVE   Bilirubin Urine NEGATIVE NEGATIVE   Ketones, ur NEGATIVE NEGATIVE mg/dL   Protein, ur NEGATIVE NEGATIVE mg/dL   Urobilinogen, UA 0.2 0.0 - 1.0 mg/dL   Nitrite NEGATIVE NEGATIVE   Leukocytes, UA NEGATIVE NEGATIVE    Comment: MICROSCOPIC NOT DONE ON URINES WITH NEGATIVE PROTEIN, BLOOD, LEUKOCYTES, NITRITE, OR GLUCOSE <1000 mg/dL.  Urine rapid drug screen (hosp performed)     Status: Abnormal   Collection Time: 12/06/13  9:06 PM  Result Value Ref Range   Opiates NONE DETECTED NONE DETECTED   Cocaine NONE DETECTED NONE DETECTED   Benzodiazepines POSITIVE (A) NONE DETECTED   Amphetamines NONE DETECTED NONE DETECTED   Tetrahydrocannabinol NONE DETECTED NONE DETECTED   Barbiturates NONE DETECTED NONE DETECTED    Comment:        DRUG SCREEN FOR MEDICAL PURPOSES ONLY.  IF CONFIRMATION IS NEEDED FOR ANY PURPOSE, NOTIFY LAB WITHIN 5 DAYS.        LOWEST DETECTABLE LIMITS FOR URINE DRUG SCREEN Drug Class       Cutoff (ng/mL) Amphetamine      1000 Barbiturate      200 Benzodiazepine   734 Tricyclics       193 Opiates          300 Cocaine          300 THC              50   MRSA PCR Screening     Status: None   Collection Time: 12/06/13  9:43 PM  Result Value Ref  Range   MRSA by PCR NEGATIVE NEGATIVE    Comment:        The GeneXpert MRSA Assay (FDA approved for NASAL specimens only), is one component of a comprehensive MRSA colonization surveillance program. It is not intended to diagnose MRSA infection nor to guide or monitor treatment for MRSA infections.   Basic metabolic panel     Status: Abnormal   Collection Time: 12/07/13  4:06 AM  Result Value Ref Range   Sodium 132 (L) 137 - 147 mEq/L   Potassium 3.9 3.7 - 5.3 mEq/L   Chloride 96 96 - 112  mEq/L   CO2 20 19 - 32 mEq/L   Glucose, Bld 95 70 - 99 mg/dL   BUN 19 6 - 23 mg/dL   Creatinine, Ser 2.01 (H) 0.50 - 1.35 mg/dL   Calcium 9.2 8.4 - 10.5 mg/dL   GFR calc non Af Amer 36 (L) >90 mL/min   GFR calc Af Amer 42 (L) >90 mL/min    Comment: (NOTE) The eGFR has been calculated using the CKD EPI equation. This calculation has not been validated in all clinical situations. eGFR's persistently <90 mL/min signify possible Chronic Kidney Disease.    Anion gap 16 (H) 5 - 15  CBC     Status: Abnormal   Collection Time: 12/07/13  4:06 AM  Result Value Ref Range   WBC 6.5 4.0 - 10.5 K/uL   RBC 4.63 4.22 - 5.81 MIL/uL   Hemoglobin 12.5 (L) 13.0 - 17.0 g/dL   HCT 38.0 (L) 39.0 - 52.0 %   MCV 82.1 78.0 - 100.0 fL   MCH 27.0 26.0 - 34.0 pg   MCHC 32.9 30.0 - 36.0 g/dL   RDW 16.4 (H) 11.5 - 15.5 %   Platelets 235 150 - 400 K/uL  Urine rapid drug screen (hosp performed)     Status: None   Collection Time: 12/07/13  3:06 PM  Result Value Ref Range   Opiates NONE DETECTED NONE DETECTED   Cocaine NONE DETECTED NONE DETECTED   Benzodiazepines NONE DETECTED NONE DETECTED    Comment: DELTA CHECK NOTED   Amphetamines NONE DETECTED NONE DETECTED   Tetrahydrocannabinol NONE DETECTED NONE DETECTED   Barbiturates NONE DETECTED NONE DETECTED    Comment:        DRUG SCREEN FOR MEDICAL PURPOSES ONLY.  IF CONFIRMATION IS NEEDED FOR ANY PURPOSE, NOTIFY LAB WITHIN 5 DAYS.        LOWEST DETECTABLE  LIMITS FOR URINE DRUG SCREEN Drug Class       Cutoff (ng/mL) Amphetamine      1000 Barbiturate      200 Benzodiazepine   259 Tricyclics       563 Opiates          300 Cocaine          300 THC              50   Comprehensive metabolic panel     Status: Abnormal   Collection Time: 12/08/13  4:38 AM  Result Value Ref Range   Sodium 138 137 - 147 mEq/L   Potassium 4.6 3.7 - 5.3 mEq/L   Chloride 104 96 - 112 mEq/L    Comment: DELTA CHECK NOTED REPEATED TO VERIFY    CO2 23 19 - 32 mEq/L   Glucose, Bld 116 (H) 70 - 99 mg/dL   BUN 14 6 - 23 mg/dL   Creatinine, Ser 1.03 0.50 - 1.35 mg/dL    Comment: DELTA CHECK NOTED REPEATED TO VERIFY    Calcium 9.7 8.4 - 10.5 mg/dL   Total Protein 6.7 6.0 - 8.3 g/dL   Albumin 3.4 (L) 3.5 - 5.2 g/dL   AST 20 0 - 37 U/L    Comment: SLIGHT HEMOLYSIS HEMOLYSIS AT THIS LEVEL MAY AFFECT RESULT    ALT 17 0 - 53 U/L   Alkaline Phosphatase 67 39 - 117 U/L   Total Bilirubin 0.3 0.3 - 1.2 mg/dL   GFR calc non Af Amer 82 (L) >90 mL/min   GFR calc Af Amer >90 >90 mL/min  Comment: (NOTE) The eGFR has been calculated using the CKD EPI equation. This calculation has not been validated in all clinical situations. eGFR's persistently <90 mL/min signify possible Chronic Kidney Disease.    Anion gap 11 5 - 15  Magnesium     Status: None   Collection Time: 12/08/13  4:38 AM  Result Value Ref Range   Magnesium 2.2 1.5 - 2.5 mg/dL  Phosphorus     Status: Abnormal   Collection Time: 12/08/13  4:38 AM  Result Value Ref Range   Phosphorus 2.1 (L) 2.3 - 4.6 mg/dL  CBC     Status: Abnormal   Collection Time: 12/08/13  4:38 AM  Result Value Ref Range   WBC 6.9 4.0 - 10.5 K/uL   RBC 4.91 4.22 - 5.81 MIL/uL   Hemoglobin 13.0 13.0 - 17.0 g/dL   HCT 40.2 39.0 - 52.0 %   MCV 81.9 78.0 - 100.0 fL   MCH 26.5 26.0 - 34.0 pg   MCHC 32.3 30.0 - 36.0 g/dL   RDW 16.3 (H) 11.5 - 15.5 %   Platelets 242 150 - 400 K/uL  Blood gas, arterial     Status: Abnormal    Collection Time: 12/08/13  8:52 AM  Result Value Ref Range   O2 Content 2.0 L/min   Delivery systems NASAL CANNULA    pH, Arterial 7.466 (H) 7.350 - 7.450   pCO2 arterial 34.5 (L) 35.0 - 45.0 mmHg   pO2, Arterial 69.2 (L) 80.0 - 100.0 mmHg   Bicarbonate 24.6 (H) 20.0 - 24.0 mEq/L   TCO2 21.5 0 - 100 mmol/L   Acid-Base Excess 1.7 0.0 - 2.0 mmol/L   O2 Saturation 93.6 %   Patient temperature 98.6    Collection site LEFT RADIAL    Drawn by [id number]    Sample type ARTERIAL DRAW    Allens test (pass/fail) PASS PASS   Labs are reviewed.  Current Facility-Administered Medications  Medication Dose Route Frequency Provider Last Rate Last Dose  . albuterol (PROVENTIL) (2.5 MG/3ML) 0.083% nebulizer solution 2.5 mg  2.5 mg Nebulization Q2H PRN Chesley Mires, MD      . antiseptic oral rinse (CPC / CETYLPYRIDINIUM CHLORIDE 0.05%) solution 7 mL  7 mL Mouth Rinse q12n4p Saima Rizwan, MD   7 mL at 12/08/13 1200  . chlorhexidine (PERIDEX) 0.12 % solution 15 mL  15 mL Mouth Rinse BID Debbe Odea, MD   15 mL at 12/08/13 0800  . dexmedetomidine (PRECEDEX) 200 MCG/50ML (4 mcg/mL) infusion  0.2-3 mcg/kg/hr Intravenous Continuous Vishal Mungal, MD 32.5 mL/hr at 12/08/13 1100 1.2 mcg/kg/hr at 12/08/13 1100  . dextrose 5 %-0.9 % sodium chloride infusion   Intravenous Continuous Chesley Mires, MD 75 mL/hr at 12/08/13 1000    . enoxaparin (LOVENOX) injection 40 mg  40 mg Subcutaneous Q24H Theressa Millard, MD   40 mg at 12/07/13 2229  . folic acid injection 1 mg  1 mg Intravenous Daily Chesley Mires, MD   1 mg at 12/08/13 1022  . hydrALAZINE (APRESOLINE) injection 10 mg  10 mg Intravenous Q2H PRN Colbert Coyer, MD   10 mg at 12/08/13 1022  . HYDROmorphone (DILAUDID) injection 0.5-1 mg  0.5-1 mg Intravenous Q3H PRN Theressa Millard, MD      . Influenza vac split quadrivalent PF (FLUARIX) injection 0.5 mL  0.5 mL Intramuscular Tomorrow-1000 Saima Rizwan, MD      . ipratropium-albuterol (DUONEB) 0.5-2.5 (3)  MG/3ML nebulizer solution 3 mL  3 mL Nebulization  QID Collene Gobble, MD      . ondansetron [facility]) injection 4 mg  4 mg Intravenous Q6H PRN Theressa Millard, MD      . pantoprazole (PROTONIX) injection 40 mg  40 mg Intravenous Q12H Chesley Mires, MD   40 mg at 12/08/13 1022  . thiamine (B-1) injection 100 mg  100 mg Intravenous Daily Chesley Mires, MD   100 mg at 12/08/13 1022    Psychiatric Specialty Exam: Physical Exam as per history and physical   ROS depression, anxiety multiple psychosocial stressors   Blood pressure 127/75, pulse 60, temperature 97.3 F (36.3 C), temperature source Oral, resp. rate 22, height $RemoveBe'6\' 2"'LFpoglkRT$  (1.88 m), weight 106.2 kg (234 lb 2.1 oz), SpO2 93 %.Body mass index is 30.05 kg/(m^2).  General Appearance: Disheveled and Guarded  Eye Contact::  Good  Speech:  Clear and Coherent  Volume:  Normal  Mood:  Anxious, Depressed and Irritable  Affect:  Appropriate and Congruent  Thought Process:  Coherent and Goal Directed  Orientation:  Full (Time, Place, and Person)  Thought Content:  WDL  Suicidal Thoughts:  No  Homicidal Thoughts:  No  Memory:  Immediate;   Fair Recent;   Poor  Judgement:  Impaired  Insight:  Lacking  Psychomotor Activity:  Decreased  Concentration:  Fair  Recall:  [place] of Knowledge:Good  Language: Good  Akathisia:  NA  Handed:  Right  AIMS (if indicated):     Assets:  Communication Skills Housing Intimacy Leisure Time Resilience Social Support  Sleep:      Musculoskeletal: Strength & Muscle Tone: within normal limits Gait & Station: unable to stand Patient leans: N/A  Treatment Plan Summary: Daily contact with patient to assess and evaluate symptoms and progress in treatment Medication management  Continue current supportive treatment and was referred to the chemical dependency intensive outpatient treatment and medically stable  Elizaveta Mattice,JANARDHAHA R. 12/08/2013 11:32 AM

## 2013-12-08 NOTE — Progress Notes (Signed)
   12/08/13 1000  PT Visit Information  Last PT Received On 12/08/13  Reason Eval/Treat Not Completed Medical issues which prohibited therapy (pt on precedex)  Reason Eval/Treat Not Completed Other (comment) * see ghost/paper chart for PT note*--PT unable to get into system to document today;  This note placed for Kenyon Ana, PT [phone])

## 2013-12-08 NOTE — Progress Notes (Signed)
Clinical Social Work  MIDAS system down so paper assessment completed and placed in chart.   CSW met with patient and psych MD at bedside. Patient reports he is feeling better and was alert and oriented during assessment. Patient reports that he does not remember specific details of coming to the hospital but lives at home with a roommate. Patient reports he has not worked since 2011 due to health concerns. Patient's father who lives in [place] has been supporting him financially but reports that he is hopeful to get approved for disability soon. Patient reports he is anxious because his father was supposed to send him a check on Monday but he still has not received money. Patient reports he needs to leave the hospital so he can pay his bills. Psych MD does not feel that patient is safe to leave the hospital and encouraged IVC if patient tries to leave AMA.   Patient reports that depression has increased due to financial stress and since dtr has moved out of his house. Patient reports that his PCP prescribed him medication for depression but that he does not follow up with a psychiatrist. Patient reports that he is not interested in therapy or a psychiatrist at this time. Patient denies any SI or HI and denies that he was trying to harm himself on day of admission. Patient denies any previous suicide attempts. Patient reports he has been drinking alcohol and his last drink was on Sunday. Patient reports he only drinks beer and consumption can vary based on money. Patient reports he has been to SA treatment in the past but that he did not stay sober long after treatment. Patient agreeable for CSW to speak with dtr or sister.  CSW spoke with dtr via phone who reports she knew that patient was depressed since she left the house because he is more lonely now. Dtr reports that patient is usually compliant with PCP appointments and medications but has not been compliant with seeing a psychiatrist. Patient saw a  psychiatrist with dtr when she was a teenager but never on his own. Patient has had a substance abuse problem since before dtr was born and has always drank alcohol or abused prescription drugs. Dtr reports patient has never made any comments about harming himself or others and does not believe he is a danger but will encourage him to seek treatment and will help with financial aspect so that patient does not feel he needs to leave AMA.  CSW will continue to follow.  [place], [place] [phone]

## 2013-12-09 LAB — BASIC METABOLIC PANEL
ANION GAP: 12 (ref 5–15)
BUN: 10 mg/dL (ref 6–23)
CALCIUM: 9.7 mg/dL (ref 8.4–10.5)
CO2: 21 mEq/L (ref 19–32)
Chloride: 102 mEq/L (ref 96–112)
Creatinine, Ser: 0.96 mg/dL (ref 0.50–1.35)
GFR calc Af Amer: >90 mL/min (ref >90)
Glucose, Bld: 129 mg/dL — ABNORMAL HIGH (ref 70–99)
Potassium: 4.2 mEq/L (ref 3.7–5.3)
SODIUM: 135 meq/L — AB (ref 137–147)

## 2013-12-09 LAB — CBC
HCT: 39.5 % (ref 39.0–52.0)
Hemoglobin: 12.8 g/dL — ABNORMAL LOW (ref 13.0–17.0)
MCH: 26.3 pg (ref 26.0–34.0)
MCHC: 32.4 g/dL (ref 30.0–36.0)
MCV: 81.3 fL (ref 78.0–100.0)
PLATELETS: 258 10*3/uL (ref 150–400)
RBC: 4.86 MIL/uL (ref 4.22–5.81)
RDW: 16.4 % — AB (ref 11.5–15.5)
WBC: 6.4 10*3/uL (ref 4.0–10.5)

## 2013-12-09 MED ORDER — DEXMEDETOMIDINE HCL IN NACL 400 MCG/100ML IV SOLN
INTRAVENOUS | Status: AC
  Filled 2013-12-09: qty 100

## 2013-12-09 MED ORDER — HYDROCODONE-ACETAMINOPHEN 5-325 MG PO TABS
1.0000 | ORAL_TABLET | Freq: Once | ORAL | Status: AC
  Administered 2013-12-09: 1 via ORAL
  Filled 2013-12-09: qty 1

## 2013-12-09 MED ORDER — LORAZEPAM 2 MG/ML IJ SOLN
1.0000 mg | INTRAMUSCULAR | Status: DC | PRN
  Administered 2013-12-09: 2 mg via INTRAVENOUS
  Administered 2013-12-09: 1 mg via INTRAVENOUS
  Administered 2013-12-09 – 2013-12-12 (×8): 2 mg via INTRAVENOUS
  Filled 2013-12-09 (×11): qty 1

## 2013-12-09 MED ORDER — DEXMEDETOMIDINE HCL IN NACL 200 MCG/50ML IV SOLN
0.2000 ug/kg/h | INTRAVENOUS | Status: AC
  Administered 2013-12-09: 0.7 ug/kg/h via INTRAVENOUS
  Administered 2013-12-09: 0.5 ug/kg/h via INTRAVENOUS
  Filled 2013-12-09 (×3): qty 50

## 2013-12-09 MED ORDER — ESCITALOPRAM OXALATE 5 MG PO TABS
5.0000 mg | ORAL_TABLET | Freq: Every day | ORAL | Status: DC
  Administered 2013-12-09: 5 mg via ORAL
  Filled 2013-12-09 (×2): qty 1

## 2013-12-09 MED ORDER — HYDROCODONE-ACETAMINOPHEN 5-325 MG PO TABS
1.0000 | ORAL_TABLET | Freq: Four times a day (QID) | ORAL | Status: DC | PRN
  Administered 2013-12-09 – 2013-12-10 (×4): 1 via ORAL
  Filled 2013-12-09 (×4): qty 1

## 2013-12-09 MED ORDER — HYDRALAZINE HCL 20 MG/ML IJ SOLN
INTRAMUSCULAR | Status: AC
  Filled 2013-12-09: qty 1

## 2013-12-09 NOTE — Plan of Care (Signed)
Problem: Phase I Progression Outcomes Goal: OOB as tolerated unless otherwise ordered Outcome: Progressing Goal: Voiding-avoid urinary catheter unless indicated Outcome: Completed/Met Date Met:  12/09/13

## 2013-12-09 NOTE — Progress Notes (Signed)
PULMONARY / CRITICAL CARE MEDICINE   Name: Jesse Hartman MRN: [id number] DOB: 08/08/1960    ADMISSION DATE:  12/06/2013 CONSULTATION DATE:  12/07/2013  REFERRING MD : DT   CHIEF COMPLAINT:  "Hurts all over"  INITIAL PRESENTATION:  54 yo male presented with diffuse pain after passing out in neighbor's house.  He has hx of ETOH and polysubstance abuse.  He developed DT's and transferred to ICU 12/3  STUDIES:  12/02 CT head >> no acute findings  SIGNIFICANT EVENTS: 12/02 Admit 12/03 to ICU, start precedex, psych consulted  SUBJECTIVE:  Sleepy but wakes to voice C/o being very thirsty  VITAL SIGNS: Temp:  [97.6 F (36.4 C)-98.6 F (37 C)] 97.8 F (36.6 C) (12/05 0800) Pulse Rate:  [56] 56 (12/04 1500) Resp:  [10-28] 16 (12/05 1000) BP: (100-209)/(70-122) 167/104 mmHg (12/05 1000) SpO2:  [94 %-99 %] 95 % (12/05 1000) Weight:  [107.2 kg (236 lb 5.3 oz)] 107.2 kg (236 lb 5.3 oz) (12/05 0323) INTAKE / OUTPUT:  Intake/Output Summary (Last 24 hours) at 12/09/13 1218 Last data filed at 12/09/13 1100  Gross per 24 hour  Intake 2088.86 ml  Output   1950 ml  Net 138.86 ml    PHYSICAL EXAMINATION: General: no distress Neuro:  RASS -1, wakes to voice, slightly slurred speech, non-focal exam, no tremor HEENT:  Pupils reactive, poor dentition Cardiovascular:  Regular, no murmur Lungs:  No wheezes or crackles Abdomen:  Soft, non tender, + bowel sounds Musculoskeletal:  No edema Skin:  No rashes  LABS:  CBC  Recent Labs Lab 12/07/13 0406 12/08/13 0438 12/09/13 0415  WBC 6.5 6.9 6.4  HGB 12.5* 13.0 12.8*  HCT 38.0* 40.2 39.5  PLT 235 242 258   Coag's  Recent Labs Lab 12/06/13 1627  INR 1.00   BMET  Recent Labs Lab 12/07/13 0406 12/08/13 0438 12/09/13 0415  NA 132* 138 135*  K 3.9 4.6 4.2  CL 96 104 102  CO2 20 23 21   BUN 19 14 10   CREATININE 2.01* 1.03 0.96  GLUCOSE 95 116* 129*   Electrolytes  Recent Labs Lab 12/07/13 0406 12/08/13 0438  12/09/13 0415  CALCIUM 9.2 9.7 9.7  MG  --  2.2  --   PHOS  --  2.1*  --    Sepsis Markers  Recent Labs Lab 12/06/13 1824  LATICACIDVEN 0.90   ABG  Recent Labs Lab 12/06/13 1614 12/08/13 0852  PHART 7.373 7.466*  PCO2ART 35.5 34.5*  PO2ART 79.0* 69.2*   Liver Enzymes  Recent Labs Lab 12/06/13 1627 12/08/13 0438  AST 24 20  ALT 21 17  ALKPHOS 79 67  BILITOT 0.3 0.3  ALBUMIN 4.0 3.4*   Cardiac Enzymes  Recent Labs Lab 12/06/13 1629  PROBNP 80.0   Glucose No results for input(s): GLUCAP in the last 168 hours.  Imaging No results found.   ASSESSMENT / PLAN: NEUROLOGIC A:   Acute encephalopathy 2nd to alcohol withdrawal with delirium tremens. Depression, anxiety. Chronic back pain. Patient is denying any alcohol use P:   RASS goal: -1 to 0 Continue precedex Thiamine, folic acid Prn dilaudid Restart lexapro  PULMONARY A: Hx of COPD, OSA, tobacco abuse. P:   Changed to nebulized BD's until more stable Hold spiriva, symbicort for now Oxygen to keep SpO2 > 92% Bronchial hygiene Smoking cessation D/C BiPAP  CARDIOVASCULAR A:  Hx of HTN. P:  Hold outpt anti-HTN meds for now until better able to take PO  RENAL A:  Acute kidney injury >> likely from volume depletion. Hyponatremia. P:   KVO IVF Monitor renal fx, urine outpt, electrolytes  GASTROINTESTINAL A:   Hx of Barrett's Esophagus. Nutrition. P:   Protonix BID Regular diet  HEMATOLOGIC A:   No acute issues. P:  F/u CBC Lovenox for DVT prevention  INFECTIOUS A:   No evidence for infection. P:   Monitor clinically for signs of aspiration  ENDOCRINE A:   No acute issues. P:   Monitor blood sugar on BMET  FAMILY  - Updates: No family at bedside 12/05.  - Inter-disciplinary family meet or Palliative Care meeting due by 12/10:   TODAY'S SUMMARY:  53 yo with DT's on precedex.  Follow for resolution MS, wean as able.  At some risk for resp failure but doing well  at this time.  Hold in the ICU due to precedex.  D/C dilaudid and place percocet orders.  Ativan orders placed.  CC time 35 minutes.   The patient is critically ill with multiple organ systems failure and requires high complexity decision making for assessment and support, frequent evaluation and titration of therapies, application of advanced monitoring technologies and extensive interpretation of multiple databases.   Critical Care Time devoted to patient care services described in this note is  35  Minutes. This time reflects time of care of this signee Dr Jennet Maduro. This critical care time does not reflect procedure time, or teaching time or supervisory time of PA/NP/Med student/Med Resident etc but could involve care discussion time.  Rush Farmer, M.D. [facility] Pulmonary/Critical Care Medicine. Pager: [phone]. After hours pager: [phone].

## 2013-12-09 NOTE — Progress Notes (Signed)
PT Cancellation Note  Patient Details Name: Jesse Hartman MRN: [id number] DOB: [birth year]   Cancelled Treatment:    Reason Eval/Treat Not Completed: Medical issues which prohibited therapy; Pt with elevated BPs ~ 171/105 this am, remains on Precedex but more alert today--will attempt PT eval again tomorrow or as schedule and pt medical issues permit.    Kenyon Ana, PT Pager: [phone] 12/09/2013  Kenyon Ana 12/09/2013, 10:15 AM

## 2013-12-09 NOTE — Plan of Care (Signed)
Problem: Phase I Progression Outcomes Goal: OOB as tolerated unless otherwise ordered Outcome: Progressing OOB to BSC x1 this shift.

## 2013-12-10 DIAGNOSIS — F10232 Alcohol dependence with withdrawal with perceptual disturbance: Secondary | ICD-10-CM

## 2013-12-10 MED ORDER — METOPROLOL TARTRATE 50 MG PO TABS
50.0000 mg | ORAL_TABLET | Freq: Two times a day (BID) | ORAL | Status: DC
  Administered 2013-12-10 – 2013-12-13 (×6): 50 mg via ORAL
  Filled 2013-12-10: qty 1
  Filled 2013-12-10 (×3): qty 2
  Filled 2013-12-10 (×2): qty 1
  Filled 2013-12-10 (×2): qty 2

## 2013-12-10 MED ORDER — ESCITALOPRAM OXALATE 10 MG PO TABS
10.0000 mg | ORAL_TABLET | Freq: Every day | ORAL | Status: DC
  Administered 2013-12-10 – 2013-12-13 (×4): 10 mg via ORAL
  Filled 2013-12-10 (×3): qty 1

## 2013-12-10 MED ORDER — HYDROCODONE-ACETAMINOPHEN 5-325 MG PO TABS
1.0000 | ORAL_TABLET | ORAL | Status: DC | PRN
  Administered 2013-12-10 – 2013-12-11 (×2): 1 via ORAL
  Filled 2013-12-10 (×2): qty 1

## 2013-12-10 MED ORDER — ALPRAZOLAM 0.5 MG PO TABS
0.5000 mg | ORAL_TABLET | Freq: Three times a day (TID) | ORAL | Status: DC | PRN
  Administered 2013-12-10 – 2013-12-12 (×5): 0.5 mg via ORAL
  Filled 2013-12-10 (×5): qty 1

## 2013-12-10 MED ORDER — DEXMEDETOMIDINE HCL IN NACL 400 MCG/100ML IV SOLN
0.2000 ug/kg/h | INTRAVENOUS | Status: DC
  Administered 2013-12-10 (×2): 1.2 ug/kg/h via INTRAVENOUS
  Administered 2013-12-10 – 2013-12-11 (×3): 1.3 ug/kg/h via INTRAVENOUS
  Administered 2013-12-11: 0.7 ug/kg/h via INTRAVENOUS
  Administered 2013-12-11: 1.3 ug/kg/h via INTRAVENOUS
  Filled 2013-12-10 (×9): qty 100

## 2013-12-10 NOTE — Plan of Care (Signed)
Problem: Phase I Progression Outcomes Goal: Pain controlled with appropriate interventions Outcome: Completed/Met Date Met:  12/10/13 Goal: Hemodynamically stable Outcome: Completed/Met Date Met:  12/10/13  Problem: Phase II Progression Outcomes Goal: Obtain order to discontinue catheter if appropriate Outcome: Not Applicable Date Met:  12/10/13  Problem: Phase III Progression Outcomes Goal: Voiding independently Outcome: Completed/Met Date Met:  12/10/13     

## 2013-12-10 NOTE — Progress Notes (Signed)
Pt gets tachy 130-150 at times, hyperventilates in sleep and wakes coughing and gagging, sates he's going to vomit.  States he can't breathe, sats 98%. Tremors noted, c/o severe headache, eyes watering.  MD rounding now

## 2013-12-10 NOTE — Progress Notes (Signed)
PULMONARY / CRITICAL CARE MEDICINE   Name: Jesse Hartman MRN: [id number] DOB: 12/01/1960    ADMISSION DATE:  12/06/2013 CONSULTATION DATE:  12/07/2013  REFERRING MD : DT   CHIEF COMPLAINT:  "Hurts all over"  INITIAL PRESENTATION:  53 yo male presented with diffuse pain after passing out in neighbor's house.  He has hx of ETOH and polysubstance abuse.  He developed DT's and transferred to ICU 12/3  STUDIES:  12/02 CT head >> no acute findings  SIGNIFICANT EVENTS: 12/02 Admit 12/03 to ICU, start precedex, psych consulted  SUBJECTIVE:  Extremely agitated this AM, asking sitter to shoot him and hallucinating.  VITAL SIGNS: Temp:  [98.1 F (36.7 C)-98.7 F (37.1 C)] 98.7 F (37.1 C) (12/06 0800) Resp:  [17-37] 30 (12/06 0800) BP: (99-196)/(55-117) 139/117 mmHg (12/06 0800) SpO2:  [92 %-100 %] 100 % (12/06 0800) INTAKE / OUTPUT:  Intake/Output Summary (Last 24 hours) at 12/10/13 1048 Last data filed at 12/10/13 1034  Gross per 24 hour  Intake 2797.05 ml  Output   2700 ml  Net  97.05 ml    PHYSICAL EXAMINATION: General: anxious, hallucinating and agitated. Neuro: Awake but hallucinating, moving all ext to command but when reoriented is able to follow commands and speak coherently. HEENT:  Pupils reactive, poor dentition Cardiovascular:  Regular, no murmur Lungs:  No wheezes or crackles Abdomen:  Soft, non tender, + bowel sounds Musculoskeletal:  No edema Skin:  No rashes  LABS:  CBC  Recent Labs Lab 12/07/13 0406 12/08/13 0438 12/09/13 0415  WBC 6.5 6.9 6.4  HGB 12.5* 13.0 12.8*  HCT 38.0* 40.2 39.5  PLT 235 242 258   Coag's  Recent Labs Lab 12/06/13 1627  INR 1.00   BMET  Recent Labs Lab 12/07/13 0406 12/08/13 0438 12/09/13 0415  NA 132* 138 135*  K 3.9 4.6 4.2  CL 96 104 102  CO2 20 23 21   BUN 19 14 10   CREATININE 2.01* 1.03 0.96  GLUCOSE 95 116* 129*   Electrolytes  Recent Labs Lab 12/07/13 0406 12/08/13 0438 12/09/13 0415   CALCIUM 9.2 9.7 9.7  MG  --  2.2  --   PHOS  --  2.1*  --    Sepsis Markers  Recent Labs Lab 12/06/13 1824  LATICACIDVEN 0.90   ABG  Recent Labs Lab 12/06/13 1614 12/08/13 0852  PHART 7.373 7.466*  PCO2ART 35.5 34.5*  PO2ART 79.0* 69.2*   Liver Enzymes  Recent Labs Lab 12/06/13 1627 12/08/13 0438  AST 24 20  ALT 21 17  ALKPHOS 79 67  BILITOT 0.3 0.3  ALBUMIN 4.0 3.4*   Cardiac Enzymes  Recent Labs Lab 12/06/13 1629  PROBNP 80.0   Glucose No results for input(s): GLUCAP in the last 168 hours.  Imaging No results found.   ASSESSMENT / PLAN: NEUROLOGIC A:   Acute encephalopathy 2nd to alcohol withdrawal with delirium tremens. Depression, anxiety. Chronic back pain. Patient is denying any alcohol use P:   RASS goal: -1 to 0 Continue precedex Thiamine, folic acid Vicodin increased. Increase lexapro to home dose Psych consult called. Restart precedex.  PULMONARY A: Hx of COPD, OSA, tobacco abuse. P:   Changed to nebulized BD's until more stable Hold spiriva, symbicort for now Oxygen to keep SpO2 > 92% Bronchial hygiene Smoking cessation  CARDIOVASCULAR A:  Hx of HTN. P:  Restarted home lopressors at 50 mg PO BID with holding parameters. Monitor.  RENAL A:   Acute kidney injury >> likely from  volume depletion. Hyponatremia. P:   KVO IVF Monitor renal fx, urine outpt, electrolytes Replace electrolytes as indicated.  GASTROINTESTINAL A:   Hx of Barrett's Esophagus. Nutrition. P:   Protonix BID Regular diet  HEMATOLOGIC A:   No acute issues. P:  F/u CBC Lovenox for DVT prevention  INFECTIOUS A:   No evidence for infection. P:   Monitor clinically for signs of aspiration  ENDOCRINE A:   No acute issues. P:   Monitor blood sugar on BMET  FAMILY  - Updates: No family at bedside 12/05.  TODAY'S SUMMARY:  53 yo with DT's was off precedex but had severe episode of hallucination, will restart precedex back on,  home medications restarted, benzos added, lexapro increased, psych consult called.  Hold in the ICU overnight given previous episodes and need for precedex.  CC time 35 minutes.   The patient is critically ill with multiple organ systems failure and requires high complexity decision making for assessment and support, frequent evaluation and titration of therapies, application of advanced monitoring technologies and extensive interpretation of multiple databases.   Critical Care Time devoted to patient care services described in this note is  35  Minutes. This time reflects time of care of this signee Dr Jennet Maduro. This critical care time does not reflect procedure time, or teaching time or supervisory time of PA/NP/Med student/Med Resident etc but could involve care discussion time.  Rush Farmer, M.D. [facility] Pulmonary/Critical Care Medicine. Pager: [phone]. After hours pager: [phone].

## 2013-12-10 NOTE — Progress Notes (Signed)
Pt much better since precedex restarted. Resting quietly, restless in sleep at times, VSS, wakes to voice then returns to sleep

## 2013-12-10 NOTE — Progress Notes (Signed)
PT Cancellation Note  Patient Details Name: Jesse Hartman MRN: [id number] DOB: Jan 04, 1961   Cancelled Treatment:     PT deferred this date.  RN advises pt with severe headache and elevated anxiety level.  Will follow and proceed with Eval as pt condition allows.   Reubin Bushnell 12/10/2013, 8:10 AM

## 2013-12-10 NOTE — Consult Note (Signed)
[facility] Face-to-Face Psychiatry Consult   Reason for Consult:  Depression, intentional overdose and substance abuse Referring Physician:  Dr. Salena Saner is an 53 y.o. male. Total Time spent with patient: 45 minutes  Assessment: AXIS I:  Substance Induced Mood Disorder and Alcohol withdrawal  syndrome and alcohol dependence  AXIS II:  Deferred AXIS III:   Past Medical History  Diagnosis Date  . Hypertension   . Barrett's syndrome   . Arthritis   . Hypokalemia   . Hyponatremia   . Esophagitis   . GERD (gastroesophageal reflux disease)   . DVT (deep venous thrombosis)   . COPD (chronic obstructive pulmonary disease)   . Depression   . Substance abuse   . Hiatal Hernia 06/22/2013  . 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 Hypercalcemia   . Anxiety   . Asthma   . Blood transfusion without reported diagnosis   . Sleep apnea   . Seizures    AXIS IV:  economic problems, occupational problems, other psychosocial or environmental problems, problems related to social environment and problems with primary support group AXIS V:  31-40 impairment in reality testing  Plan:  Patient may need involuntary commitment if he tried to leave the hospital today as he has no insight about 2 mL twice a day of his current clinical condition. No evidence of imminent risk to self or others at present.   Patient does not meet criteria for psychiatric inpatient admission. Supportive therapy provided about ongoing stressors. Refer to IOP. Appreciate psychiatric consultation Please contact [phone] or [phone] if needs further assistance   Subjective:   Jesse Hartman is a 53 y.o. male patient admitted with Depression, intentional overdose and substance abuse.  HPI:  Patient seen, chart reviewed and case discussed with staff RN and Unk Lightning, LCSW. Patient has been suffering with depression, anxiety, alcohol abuse versus dependence. Patient reportedly ran out of his money and not able to drink  alcohol since Sunday. Reportedly patient was taken somebody else medication as a overdose which is intentional. Patient denies intention to harm himself or others. Patient reported his father is a 42 years old in [place] send check every month which he has been using since 2011. Patient has a 2 daughters in [place] who seems to be supportive to him. Patient has a sister in the [place] who is a Art therapist patient provided consent to speak with his daughter and his sister but not with his father. Patient denies auditory or visual hallucination, delusions and paranoia. Patient stated he feels that he is ready to be discharged and need to take care of his utilities and rent, and he did not realize he is in ICU with the Precedex drip. Patient was informed he is not ready to be discharged at this time and also offered substance abuse rehabilitation treatment which he refuses at this time. As per the chart patient was refusing rehabilitation treatment even in the past. Patient reported he has a multiple medical problems and surgeries in the past and he lost his ability to work since 2011 and has been receiving outpatient medication management [place]. Patient to urine drug screen is positive for benzodiazepine.  Patient seen again today on 12/10/2013. Apparently had an episode of hallucinations this morning and Precedex had to be restarted. He is coherent now denying any hallucinations. He minimizes his alcohol and substance use and claims he drinks very little. His Lexapro and Ativan have been increased.  He denies suicidal ideation and his thoughts are coherent but he is complaining of pain "all over"  Medical history: Jesse Hartman is a 53 y.o. male with a history of Alcohol Abuse, and Polysubstance Abuse who was brought to the ED After he was found passed out in a neighbor's house by his daughter. She reports that he lives alone and she checks in on him and  could not find him, and one of his friends told her he was at a neighbors house. He was unresponsive and in the ED became responsive and reports that he had taken pills, Xanax ( and Neurontin, and a" yellow pill." at the [facility]. He reports that he drank 8-12 beers yesterday, and that he drinks 5-6 beers on a daily basis. He denies any suicidal intention.    Review of Systems:  Constitutional: No Weight Loss, No Weight Gain, Night Sweats, Fevers, Chills, Dizziness, Fatigue, or Generalized Weakness HEENT: No Headaches, Difficulty Swallowing,Tooth/Dental Problems,Sore Throat,  No Sneezing, Rhinitis, Ear Ache, Nasal Congestion, or Post Nasal Drip,  Cardio-vascular: No Chest pain, Orthopnea, PND, Edema in Lower Extremities, Anasarca, Dizziness, Palpitations  Resp: No Dyspnea, No DOE, No Productive Cough, No Non-Productive Cough, No Hemoptysis, No Wheezing.  GI: No Heartburn, Indigestion, Abdominal Pain, Nausea, Vomiting, Diarrhea, Hematemesis, Hematochezia, Melena, Change in Bowel Habits, Loss of Appetite  GU: No Dysuria, Change in Color of Urine, No Urgency or Frequency, No Flank pain.  Musculoskeletal: No Joint Pain or Swelling, No Decreased Range of Motion, No Back Pain.  Neurologic: +Syncope, No Seizures, Muscle Weakness, Paresthesia, Vision Disturbance or Loss, No Diplopia, No Vertigo, No Difficulty Walking,  Skin: No Rash or Lesions. Psych: No Change in Mood or Affect, No Depression or Anxiety, No Memory loss, +Confusion, or Hallucinations HPI Elements:   Location:  Alcohol withdrawal . Quality:  Poor, presented with altered mental status. Severity:  Status post overdose of medication. Timing:  Ran out of money to drink alcohol.  Past Psychiatric History: Past Medical History  Diagnosis Date  . Hypertension   . Barrett's syndrome   . Arthritis   . Hypokalemia   . Hyponatremia   . Esophagitis   . GERD (gastroesophageal reflux disease)   . DVT (deep venous  thrombosis)   . COPD (chronic obstructive pulmonary disease)   . Depression   . Substance abuse   . Hiatal Hernia 06/22/2013  . 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 Hypercalcemia   . Anxiety   . Asthma   . Blood transfusion without reported diagnosis   . Sleep apnea   . Seizures     reports that he has been smoking Cigarettes.  He has a 7.5 pack-year smoking history. He has never used smokeless tobacco. He reports that he drinks about 16.8 oz of alcohol per week. He reports that he uses illicit drugs. Family History  Problem Relation Age of Onset  . Diabetes Maternal Grandfather   . Heart disease Father      Living Arrangements: Children   Abuse/Neglect [facility]) Physical Abuse: Denies Verbal Abuse: Denies Sexual Abuse: Denies Allergies:   Allergies  Allergen Reactions  . Food Hives    Yogurt     ACT Assessment Complete:  No Objective: Blood pressure 126/76, pulse 56, temperature 98.2 F (36.8 C), temperature source Oral, resp. rate 20, height $RemoveBe'6\' 2"'AVWgYpYMf$  (1.88 m), weight 236 lb 5.3 oz (107.2 kg), SpO2 94 %.Body mass index is 30.33 kg/(m^2). Results for orders placed or performed during the hospital encounter of 12/06/13 (from the  past 72 hour(s))  Urine rapid drug screen (hosp performed)     Status: None   Collection Time: 12/07/13  3:06 PM  Result Value Ref Range   Opiates NONE DETECTED NONE DETECTED   Cocaine NONE DETECTED NONE DETECTED   Benzodiazepines NONE DETECTED NONE DETECTED    Comment: DELTA CHECK NOTED   Amphetamines NONE DETECTED NONE DETECTED   Tetrahydrocannabinol NONE DETECTED NONE DETECTED   Barbiturates NONE DETECTED NONE DETECTED    Comment:        DRUG SCREEN FOR MEDICAL PURPOSES ONLY.  IF CONFIRMATION IS NEEDED FOR ANY PURPOSE, NOTIFY LAB WITHIN 5 DAYS.        LOWEST DETECTABLE LIMITS FOR URINE DRUG SCREEN Drug Class       Cutoff (ng/mL) Amphetamine      1000 Barbiturate      200 Benzodiazepine   696 Tricyclics       295 Opiates           300 Cocaine          300 THC              50   Comprehensive metabolic panel     Status: Abnormal   Collection Time: 12/08/13  4:38 AM  Result Value Ref Range   Sodium 138 137 - 147 mEq/L   Potassium 4.6 3.7 - 5.3 mEq/L   Chloride 104 96 - 112 mEq/L    Comment: DELTA CHECK NOTED REPEATED TO VERIFY    CO2 23 19 - 32 mEq/L   Glucose, Bld 116 (H) 70 - 99 mg/dL   BUN 14 6 - 23 mg/dL   Creatinine, Ser 1.03 0.50 - 1.35 mg/dL    Comment: DELTA CHECK NOTED REPEATED TO VERIFY    Calcium 9.7 8.4 - 10.5 mg/dL   Total Protein 6.7 6.0 - 8.3 g/dL   Albumin 3.4 (L) 3.5 - 5.2 g/dL   AST 20 0 - 37 U/L    Comment: SLIGHT HEMOLYSIS HEMOLYSIS AT THIS LEVEL MAY AFFECT RESULT    ALT 17 0 - 53 U/L   Alkaline Phosphatase 67 39 - 117 U/L   Total Bilirubin 0.3 0.3 - 1.2 mg/dL   GFR calc non Af Amer 82 (L) >90 mL/min   GFR calc Af Amer >90 >90 mL/min    Comment: (NOTE) The eGFR has been calculated using the CKD EPI equation. This calculation has not been validated in all clinical situations. eGFR's persistently <90 mL/min signify possible Chronic Kidney Disease.    Anion gap 11 5 - 15  Magnesium     Status: None   Collection Time: 12/08/13  4:38 AM  Result Value Ref Range   Magnesium 2.2 1.5 - 2.5 mg/dL  Phosphorus     Status: Abnormal   Collection Time: 12/08/13  4:38 AM  Result Value Ref Range   Phosphorus 2.1 (L) 2.3 - 4.6 mg/dL  CBC     Status: Abnormal   Collection Time: 12/08/13  4:38 AM  Result Value Ref Range   WBC 6.9 4.0 - 10.5 K/uL   RBC 4.91 4.22 - 5.81 MIL/uL   Hemoglobin 13.0 13.0 - 17.0 g/dL   HCT 40.2 39.0 - 52.0 %   MCV 81.9 78.0 - 100.0 fL   MCH 26.5 26.0 - 34.0 pg   MCHC 32.3 30.0 - 36.0 g/dL   RDW 16.3 (H) 11.5 - 15.5 %   Platelets 242 150 - 400 K/uL  Blood gas, arterial     Status:  Abnormal   Collection Time: 12/08/13  8:52 AM  Result Value Ref Range   O2 Content 2.0 L/min   Delivery systems NASAL CANNULA    pH, Arterial 7.466 (H) 7.350 - 7.450   pCO2  arterial 34.5 (L) 35.0 - 45.0 mmHg   pO2, Arterial 69.2 (L) 80.0 - 100.0 mmHg   Bicarbonate 24.6 (H) 20.0 - 24.0 mEq/L   TCO2 21.5 0 - 100 mmol/L   Acid-Base Excess 1.7 0.0 - 2.0 mmol/L   O2 Saturation 93.6 %   Patient temperature 98.6    Collection site LEFT RADIAL    Drawn by [id number]    Sample type ARTERIAL DRAW    Allens test (pass/fail) PASS PASS  Basic metabolic panel     Status: Abnormal   Collection Time: 12/09/13  4:15 AM  Result Value Ref Range   Sodium 135 (L) 137 - 147 mEq/L   Potassium 4.2 3.7 - 5.3 mEq/L    Comment: MODERATE HEMOLYSIS HEMOLYSIS AT THIS LEVEL MAY AFFECT RESULT    Chloride 102 96 - 112 mEq/L   CO2 21 19 - 32 mEq/L   Glucose, Bld 129 (H) 70 - 99 mg/dL   BUN 10 6 - 23 mg/dL   Creatinine, Ser 0.96 0.50 - 1.35 mg/dL   Calcium 9.7 8.4 - 10.5 mg/dL   GFR calc non Af Amer >90 >90 mL/min   GFR calc Af Amer >90 >90 mL/min    Comment: (NOTE) The eGFR has been calculated using the CKD EPI equation. This calculation has not been validated in all clinical situations. eGFR's persistently <90 mL/min signify possible Chronic Kidney Disease.    Anion gap 12 5 - 15  CBC     Status: Abnormal   Collection Time: 12/09/13  4:15 AM  Result Value Ref Range   WBC 6.4 4.0 - 10.5 K/uL   RBC 4.86 4.22 - 5.81 MIL/uL   Hemoglobin 12.8 (L) 13.0 - 17.0 g/dL   HCT 39.5 39.0 - 52.0 %   MCV 81.3 78.0 - 100.0 fL   MCH 26.3 26.0 - 34.0 pg   MCHC 32.4 30.0 - 36.0 g/dL   RDW 16.4 (H) 11.5 - 15.5 %   Platelets 258 150 - 400 K/uL   Labs are reviewed.  Current Facility-Administered Medications  Medication Dose Route Frequency Provider Last Rate Last Dose  . albuterol (PROVENTIL) (2.5 MG/3ML) 0.083% nebulizer solution 2.5 mg  2.5 mg Nebulization Q2H PRN Chesley Mires, MD      . ALPRAZolam Duanne Moron) tablet 0.5 mg  0.5 mg Oral TID PRN Rush Farmer, MD   0.5 mg at 12/10/13 1327  . antiseptic oral rinse (CPC / CETYLPYRIDINIUM CHLORIDE 0.05%) solution 7 mL  7 mL Mouth Rinse q12n4p Debbe Odea, MD   7 mL at 12/09/13 1847  . dexmedetomidine (PRECEDEX) 400 MCG/100ML (4 mcg/mL) infusion  0.2-1.3 mcg/kg/hr Intravenous Titrated Rush Farmer, MD 34.8 mL/hr at 12/10/13 1325 1.3 mcg/kg/hr at 12/10/13 1325  . dextrose 5 %-0.9 % sodium chloride infusion   Intravenous Continuous Chesley Mires, MD 75 mL/hr at 12/09/13 1303    . enoxaparin (LOVENOX) injection 40 mg  40 mg Subcutaneous Q24H Theressa Millard, MD   40 mg at 12/09/13 2219  . [START ON 12/11/2013] escitalopram (LEXAPRO) tablet 10 mg  10 mg Oral Daily Rush Farmer, MD   10 mg at 12/10/13 1013  . folic acid injection 1 mg  1 mg Intravenous Daily Chesley Mires, MD   1 mg  at 12/10/13 0937  . hydrALAZINE (APRESOLINE) injection 10 mg  10 mg Intravenous Q2H PRN Colbert Coyer, MD   10 mg at 12/10/13 0751  . HYDROcodone-acetaminophen (NORCO/VICODIN) 5-325 MG per tablet 1 tablet  1 tablet Oral Q4H PRN Rush Farmer, MD      . Influenza vac split quadrivalent PF (FLUARIX) injection 0.5 mL  0.5 mL Intramuscular Tomorrow-1000 Saima Rizwan, MD   0.5 mL at 12/09/13 1000  . ipratropium-albuterol (DUONEB) 0.5-2.5 (3) MG/3ML nebulizer solution 3 mL  3 mL Nebulization QID Collene Gobble, MD   3 mL at 12/10/13 1144  . LORazepam (ATIVAN) injection 1-2 mg  1-2 mg Intravenous Q1H PRN Rush Farmer, MD   2 mg at 12/10/13 0954  . metoprolol tartrate (LOPRESSOR) tablet 50 mg  50 mg Oral BID Rush Farmer, MD   50 mg at 12/10/13 1013  . ondansetron (ZOFRAN) injection 4 mg  4 mg Intravenous Q6H PRN Theressa Millard, MD      . pantoprazole (PROTONIX) injection 40 mg  40 mg Intravenous Q12H Chesley Mires, MD   40 mg at 12/10/13 0937  . thiamine (B-1) injection 100 mg  100 mg Intravenous Daily Chesley Mires, MD   100 mg at 12/10/13 0240    Psychiatric Specialty Exam: Physical Exam as per history and physical   ROS depression, anxiety multiple psychosocial stressors   Blood pressure 126/76, pulse 56, temperature 98.2 F (36.8 C), temperature source  Oral, resp. rate 20, height $RemoveBe'6\' 2"'ZIBDMhJzB$  (1.88 m), weight 236 lb 5.3 oz (107.2 kg), SpO2 94 %.Body mass index is 30.33 kg/(m^2).  General Appearance: Disheveled and Guarded  Eye Contact::  Good  Speech:  Clear and Coherent  Volume:  Normal  Mood:  Anxious, Depressed and Irritable  Affect:  Appropriate and Congruent  Thought Process:  Coherent and Goal Directed  Orientation:  Full (Time, Place, and Person)  Thought Content:  WDL  Suicidal Thoughts:  No  Homicidal Thoughts:  No  Memory:  Immediate;   Fair Recent;   Poor  Judgement:  Impaired  Insight:  Lacking  Psychomotor Activity:  Decreased  Concentration:  Fair  Recall:  [place] of Knowledge:Good  Language: Good  Akathisia:  NA  Handed:  Right  AIMS (if indicated):     Assets:  Communication Skills Housing Intimacy Leisure Time Resilience Social Support  Sleep:      Musculoskeletal: Strength & Muscle Tone: within normal limits Gait & Station: unable to stand Patient leans: N/A  Treatment Plan Summary: Daily contact with patient to assess and evaluate symptoms and progress in treatment Medication management  Continue current supportive treatment and was referred to the chemical dependency intensive outpatient treatment when medically stable. He may need an antipsychotic medication if he continues to hallucinate  Milad Bublitz, [facility] 12/10/2013 1:34 PM

## 2013-12-11 LAB — CBC
HCT: 38.6 % — ABNORMAL LOW (ref 39.0–52.0)
HEMOGLOBIN: 12.5 g/dL — AB (ref 13.0–17.0)
MCH: 26.5 pg (ref 26.0–34.0)
MCHC: 32.4 g/dL (ref 30.0–36.0)
MCV: 82 fL (ref 78.0–100.0)
PLATELETS: 210 10*3/uL (ref 150–400)
RBC: 4.71 MIL/uL (ref 4.22–5.81)
RDW: 15.9 % — AB (ref 11.5–15.5)
WBC: 5.9 10*3/uL (ref 4.0–10.5)

## 2013-12-11 LAB — BASIC METABOLIC PANEL
Anion gap: 13 (ref 5–15)
BUN: 9 mg/dL (ref 6–23)
CALCIUM: 9.6 mg/dL (ref 8.4–10.5)
CO2: 21 mEq/L (ref 19–32)
Chloride: 107 mEq/L (ref 96–112)
Creatinine, Ser: 0.89 mg/dL (ref 0.50–1.35)
GLUCOSE: 112 mg/dL — AB (ref 70–99)
Potassium: 4.3 mEq/L (ref 3.7–5.3)
SODIUM: 141 meq/L (ref 137–147)

## 2013-12-11 LAB — MAGNESIUM: MAGNESIUM: 1.9 mg/dL (ref 1.5–2.5)

## 2013-12-11 LAB — PHOSPHORUS: Phosphorus: 3.3 mg/dL (ref 2.3–4.6)

## 2013-12-11 MED ORDER — CLONIDINE HCL 0.1 MG PO TABS
0.1000 mg | ORAL_TABLET | Freq: Three times a day (TID) | ORAL | Status: DC
  Administered 2013-12-11 – 2013-12-13 (×7): 0.1 mg via ORAL
  Filled 2013-12-11 (×9): qty 1

## 2013-12-11 MED ORDER — HYDROCODONE-ACETAMINOPHEN 10-325 MG PO TABS
2.0000 | ORAL_TABLET | Freq: Four times a day (QID) | ORAL | Status: DC | PRN
  Administered 2013-12-11 – 2013-12-13 (×6): 2 via ORAL
  Filled 2013-12-11 (×6): qty 2

## 2013-12-11 MED ORDER — LORAZEPAM 1 MG PO TABS
1.0000 mg | ORAL_TABLET | Freq: Three times a day (TID) | ORAL | Status: DC
  Administered 2013-12-11 – 2013-12-13 (×7): 1 mg via ORAL
  Filled 2013-12-11 (×7): qty 1

## 2013-12-11 MED ORDER — PANTOPRAZOLE SODIUM 40 MG PO TBEC
40.0000 mg | DELAYED_RELEASE_TABLET | Freq: Two times a day (BID) | ORAL | Status: DC
  Administered 2013-12-11 – 2013-12-13 (×5): 40 mg via ORAL
  Filled 2013-12-11 (×6): qty 1

## 2013-12-11 MED ORDER — VITAMIN B-1 100 MG PO TABS
100.0000 mg | ORAL_TABLET | Freq: Every day | ORAL | Status: DC
  Administered 2013-12-12 – 2013-12-13 (×2): 100 mg via ORAL
  Filled 2013-12-11 (×2): qty 1

## 2013-12-11 MED ORDER — FOLIC ACID 1 MG PO TABS
1.0000 mg | ORAL_TABLET | Freq: Every day | ORAL | Status: DC
  Administered 2013-12-12 – 2013-12-13 (×2): 1 mg via ORAL
  Filled 2013-12-11 (×2): qty 1

## 2013-12-11 NOTE — Clinical Documentation Improvement (Signed)
Supporting Information: Acute kidney injury with chronic kidney disease per 12/02 progress notes.  Labs:   Bun: 12/02: 17 12/03: 19 12/04: 14  Creat: 12/02:  2.56 12/03:  2.01 12/04:  1.03  GFR: 12/02:  27 12/03:  36 12/04:  82     Possible Diagnosis? . Document the stage of CKD --Chronic kidney disease, stage 1- GFR > OR = 90 --Chronic kidney disease, stage 2 (mild) - GFR 60-89 --Chronic kidney disease, stage 3 (moderate) - GFR 30-59 --Chronic kidney disease, stage 4 (severe) - GFR 15-29 --Chronic kidney disease, stage 5- GFR < 15 --End-stage renal disease (ESRD) . Document any underlying cause of CKD such as Diabetes or Hypertension . Document if the patient is dependent on Dialysis . Chronic renal failure without a documented stage will be assigned to Chronic kidney disease, unspecified . Document any associated diagnoses/conditions    Thank Sherian Maroon Documentation Specialist [phone] [place] [email or web address]

## 2013-12-11 NOTE — Evaluation (Signed)
Physical Therapy Evaluation Patient Details Name: Jesse Hartman MRN: [id number] DOB: Jul 08, 1960 Today's Date: 12/11/2013   History of Present Illness  Jesse Hartman is a 53 y.o. male with a history of Alcohol Abuse, and Polysubstance Abuse who was brought to the ED 12/06/13   After he was found passed out in a neighbor's house by his daughter.  He was unresponsive and in the ED became responsive.  patient reports  taking  overdose of medication.  Clinical Impression  Patient ceruently on Precedex, RN expressed concern for PT to mobilize, noted order for mobility today, patient able to participate. Patient will benefit from PT to address problems listed in note below.  Follow Up Recommendations Home health PT;SNF;Supervision/Assistance - 24 hour (uncertain re:  DC plan for  substnce abuse)    Equipment Recommendations  Rolling walker with 5" wheels    Recommendations for Other Services OT consult     Precautions / Restrictions Precautions Precautions: Fall      Mobility  Bed Mobility Overal bed mobility: Needs Assistance Bed Mobility: Supine to Sit     Supine to sit: Min assist;HOB elevated     General bed mobility comments: cues for safety, extra time to accommodate upright posture  Transfers Overall transfer level: Needs assistance Equipment used: Rolling walker (2 wheeled) Transfers: Sit to/from Stand Sit to Stand: Min assist;From elevated surface         General transfer comment: cues for safety, hand placement  Ambulation/Gait Ambulation/Gait assistance: Mod assist;+2 safety/equipment Ambulation Distance (Feet): 10 Feet Assistive device: Rolling walker (2 wheeled) Gait Pattern/deviations: Step-to pattern;Shuffle;Antalgic;Decreased step length - right;Decreased stance time - right     General Gait Details: decreased support of R leg during stance,  frequent stops for rest break.  Stairs            Wheelchair Mobility    Modified Rankin (Stroke Patients  Only)       Balance Overall balance assessment: History of Falls;Needs assistance Sitting-balance support: Bilateral upper extremity supported;Feet supported Sitting balance-Leahy Scale: Good     Standing balance support: Bilateral upper extremity supported;During functional activity Standing balance-Leahy Scale: Poor Standing balance comment: R leg buckling                             Pertinent Vitals/Pain Pain Assessment: 0-10 Pain Score: 6  Pain Location: R  lower back, R foot    Home Living Family/patient expects to be discharged to:: Private residence Living Arrangements: Alone Available Help at Discharge: Family;Available PRN/intermittently Type of Home: Apartment Home Access: Stairs to enter Entrance Stairs-Rails: Right Entrance Stairs-Number of Steps: 3 Home Layout: One level Home Equipment: Cane - single point      Prior Function Level of Independence: Independent with assistive device(s)         Comments: patient reports frequent due to R  leg weakness.     Hand Dominance        Extremity/Trunk Assessment   Upper Extremity Assessment: Generalized weakness;RUE deficits/detail;LUE deficits/detail RUE Deficits / Details: reports tinglng in fingers     LUE Deficits / Details: same   Lower Extremity Assessment: RLE deficits/detail RLE Deficits / Details: dorsiflexion 2+,  reports tingling in foot       Communication   Communication: No difficulties  Cognition Arousal/Alertness: Awake/alert Behavior During Therapy: WFL for tasks assessed/performed Overall Cognitive Status: Within Functional Limits for tasks assessed  General Comments      Exercises        Assessment/Plan    PT Assessment Patient needs continued PT services  PT Diagnosis Difficulty walking;Generalized weakness;Acute pain   PT Problem List Decreased strength;Decreased balance;Decreased activity tolerance;Decreased  mobility;Decreased knowledge of precautions;Decreased safety awareness;Decreased knowledge of use of DME;Pain  PT Treatment Interventions DME instruction;Gait training;Functional mobility training;Therapeutic activities;Therapeutic exercise;Patient/family education   PT Goals (Current goals can be found in the Care Plan section) Acute Rehab PT Goals Patient Stated Goal: to  get my back fixed PT Goal Formulation: With patient Time For Goal Achievement: 12/25/13 Potential to Achieve Goals: Good    Frequency Min 3X/week   Barriers to discharge Decreased caregiver support      Co-evaluation               End of Session Equipment Utilized During Treatment: Gait belt Activity Tolerance: Patient limited by fatigue Patient left: in chair;with call bell/phone within reach;with nursing/sitter in room Nurse Communication: Mobility status         Time: 2811-8867 PT Time Calculation (min) (ACUTE ONLY): 23 min   Charges:   PT Evaluation $Initial PT Evaluation Tier I: 1 Procedure PT Treatments $Gait Training: 23-37 mins   PT G CodesClaretha Hartman 12/11/2013, 2:40 PM Tresa Endo PT [phone]

## 2013-12-11 NOTE — Plan of Care (Signed)
Problem: Phase I Progression Outcomes Goal: OOB as tolerated unless otherwise ordered Outcome: Progressing     

## 2013-12-11 NOTE — Progress Notes (Signed)
Key Points: Use following P&T approved IV to PO medication change policy.  Description contains the criteria that are approved Note: Policy Excludes:  Esophagectomy patientsPHARMACIST - PHYSICIAN COMMUNICATION DR:   Elsworth Soho CONCERNING: IV to Oral Route Change Policy  RECOMMENDATION: This patient is receiving folic acid/thiamine by the intravenous route.  Based on criteria approved by the Pharmacy and Therapeutics Committee, the intravenous medication(s) is/are being converted to the equivalent oral dose form(s).   DESCRIPTION: These criteria include:  The patient is eating (either orally or via tube) and/or has been taking other orally administered medications for a least 24 hours  The patient has no evidence of active gastrointestinal bleeding or impaired GI absorption (gastrectomy, short bowel, patient on TNA or NPO).  If you have questions about this conversion, please contact the Pharmacy Department  []   [phone] )  [facility] []   [phone] )  [facility]  []   [phone] )  [facility] [x]   [phone] )  Gene Autry, [place], [facility] 12/11/2013 1:17 PM

## 2013-12-11 NOTE — Progress Notes (Signed)
PULMONARY / CRITICAL CARE MEDICINE   Name: Jesse Hartman MRN: [id number] DOB: 10-07-1960    ADMISSION DATE:  12/06/2013 CONSULTATION DATE:  12/07/2013  REFERRING MD : DT   CHIEF COMPLAINT:  "Hurts all over"  INITIAL PRESENTATION:  53 yo male presented with diffuse pain after passing out in neighbor's house.  He has hx of ETOH and polysubstance abuse.  He developed DT's and transferred to ICU 12/3  STUDIES:  12/02 CT head >> no acute findings  SIGNIFICANT EVENTS: 12/02 Admit 12/03 to ICU, start precedex, psych consulted 12/6 precedex resumed   SUBJECTIVE:  Extremely agitated this AM, asking sitter to shoot him and hallucinating.  VITAL SIGNS: Temp:  [97.4 F (36.3 C)-98.7 F (37.1 C)] 97.4 F (36.3 C) (12/07 0350) Resp:  [16-39] 27 (12/07 0818) BP: (95-177)/(19-126) 177/91 mmHg (12/07 0653) SpO2:  [94 %-99 %] 98 % (12/07 0818) INTAKE / OUTPUT:  Intake/Output Summary (Last 24 hours) at 12/11/13 0902 Last data filed at 12/11/13 0848  Gross per 24 hour  Intake 2752.8 ml  Output   2275 ml  Net  477.8 ml    PHYSICAL EXAMINATION: General: anxious, confabulating; but cooperative and pleasant  Neuro: Awake, able to follow commands and speak coherently. HEENT:  Pupils reactive, poor dentition Cardiovascular:  Regular, no murmur Lungs:  No wheezes or crackles, + upper airway wheeze w/ forced exhale  Abdomen:  Soft, non tender, + bowel sounds Musculoskeletal:  No edema Skin:  No rashes  LABS:  CBC  Recent Labs Lab 12/08/13 0438 12/09/13 0415 12/11/13 0355  WBC 6.9 6.4 5.9  HGB 13.0 12.8* 12.5*  HCT 40.2 39.5 38.6*  PLT 242 258 210   Coag's  Recent Labs Lab 12/06/13 1627  INR 1.00   BMET  Recent Labs Lab 12/08/13 0438 12/09/13 0415 12/11/13 0355  NA 138 135* 141  K 4.6 4.2 4.3  CL 104 102 107  CO2 23 21 21   BUN 14 10 9   CREATININE 1.03 0.96 0.89  GLUCOSE 116* 129* 112*   Electrolytes  Recent Labs Lab 12/08/13 0438 12/09/13 0415  12/11/13 0355  CALCIUM 9.7 9.7 9.6  MG 2.2  --  1.9  PHOS 2.1*  --  3.3      ASSESSMENT / PLAN:  Acute encephalopathy 2nd to alcohol withdrawal with delirium tremens. Depression, anxiety. Chronic back pain. Patient is denying any alcohol use Plan:   RASS goal: -1 to 0 Will re-attempt to wean precedex Add low dose clonidine Cont Thiamine, folic acid  Vicodin increased. Increase lexapro to home dose Psych consulted    Hx of COPD, OSA, tobacco abuse. Plan:   Changed to nebulized BD's until more stable Hold spiriva, symbicort for now Oxygen to keep SpO2 > 92% Bronchial hygiene Smoking cessation   Hx of HTN. P:  Restarted home lopressors at 50 mg PO BID with holding parameters. Monitor.   Hx of Barrett's Esophagus. Nutrition. P:   Protonix BID Regular diet    FAMILY  - Updates: No family at bedside 12/05.  NP SUMMARY Cooperative. Not in distress. Confabulates but not agitated. Will try to slowly cut back on precedex. Wants to get OOB, agree, this will help. Still needs to be monitored in the SDU setting as we wean the precedex.  [name] ACNP-BC [place] Pager # [phone] OR # [phone] if no answer   TODAY'S MD SUMMARY: Severe ETOH withdrawal delerium requiring precedex gtt. Will add scheduled ativan to attempt to wean off precedex.  Eventual outpatient treatment for substance abuse.   ATTENDING NOTE: I have personally reviewed patient's available data, including medical history, events of note, physical examination and test results as part of my evaluation. I have discussed with resident/NP and other careteam providers such as pharmacist, RN and RRT & co-ordinated with consultants. In addition, I personally evaluated patient and elicited key history of ETOH abuse, stopped drinking since sunday, exam findings of precedex gtt, still hallucinating, verbose & labs showing nml K/Mg.  Rest per NP/medical resident whose note is outlined  above and that I agree with and edited in full.   The patient is critically ill with multiple organ systems failure and requires high complexity decision making for assessment and support, frequent evaluation and titration of therapies, application of advanced monitoring technologies and extensive interpretation of multiple databases. Critical Care Time devoted to patient care services described in this note independent of APP time is 35 minutes.    Rigoberto Noel MD

## 2013-12-12 MED ORDER — BUDESONIDE-FORMOTEROL FUMARATE 160-4.5 MCG/ACT IN AERO
2.0000 | INHALATION_SPRAY | Freq: Two times a day (BID) | RESPIRATORY_TRACT | Status: DC
  Administered 2013-12-12 – 2013-12-13 (×2): 2 via RESPIRATORY_TRACT
  Filled 2013-12-12: qty 6

## 2013-12-12 MED ORDER — MORPHINE SULFATE 2 MG/ML IJ SOLN
2.0000 mg | INTRAMUSCULAR | Status: DC | PRN
  Administered 2013-12-12 – 2013-12-13 (×4): 2 mg via INTRAVENOUS
  Filled 2013-12-12 (×5): qty 1

## 2013-12-12 MED ORDER — TIOTROPIUM BROMIDE MONOHYDRATE 18 MCG IN CAPS
18.0000 ug | ORAL_CAPSULE | Freq: Every day | RESPIRATORY_TRACT | Status: DC
  Administered 2013-12-13: 18 ug via RESPIRATORY_TRACT
  Filled 2013-12-12: qty 5

## 2013-12-12 NOTE — Progress Notes (Signed)
Patient transferred to room 1505 via wheelchair.

## 2013-12-12 NOTE — Progress Notes (Signed)
CARE MANAGEMENT NOTE 12/12/2013  Patient:  Jesse Hartman, Jesse Hartman   Account Number:  [id number]  Date Initiated:  12/12/2013  Documentation initiated by:  Edwyna Shell  Subjective/Objective Assessment:   53 yo male admitted with alcohol withdrawal and suspected OD from home alone     Action/Plan:   discharge planning   Anticipated DC Date:  12/14/2013   Anticipated DC Plan:  [place]  CM consult      Choice offered to / List presented to:  C-1 Patient           Status of service:  In process, will continue to follow Medicare Important Message given?   (If response is "NO", the following Medicare IM given date fields will be blank) Date Medicare IM given:   Medicare IM given by:   Date Additional Medicare IM given:   Additional Medicare IM given by:    Discharge Disposition:  [place]  Per UR Regulation:  Reviewed for med. necessity/level of care/duration of stay  If discussed at [place] of Stay Meetings, dates discussed:    Comments:  12/12/13 Edwyna Shell RN BSN CM [phone] transfer from ICU to [place] 12/12/13. PT recommended HH PT, spoke with patient and discussed Medicaid coverage of [facility] services and patient stated that he is aware that Medicaid does not cover [place] PT. Discussed HH RN for safety eval and patient agreeable if MD feels appropriate. Patient stated that he has used [facility] in the past and would like to use them again if appropriate. Pt stated that his preference is to return home. CM will continue to follow for discharge planning needs.

## 2013-12-12 NOTE — Progress Notes (Signed)
PULMONARY / CRITICAL CARE MEDICINE   Name: Jesse Hartman MRN: [id number] DOB: January 20, 1960    ADMISSION DATE:  12/06/2013 CONSULTATION DATE:  12/07/2013  REFERRING MD : DT   CHIEF COMPLAINT:  "Hurts all over"  INITIAL PRESENTATION:  53 yo male presented with diffuse pain after passing out in neighbor's house.  He has hx of ETOH and polysubstance abuse.  He developed DT's and transferred to ICU 12/3  STUDIES:  12/02 CT head >> no acute findings  SIGNIFICANT EVENTS: 12/02 Admit 12/03 to ICU, start precedex, psych consulted 12/6 precedex resumed   SUBJECTIVE:  No longer agitated. Comfortable. Cooperative. C/o back and leg pain.   VITAL SIGNS: Temp:  [98.2 F (36.8 C)-99.3 F (37.4 C)] 98.2 F (36.8 C) (12/08 0401) Pulse Rate:  [75-77] 75 (12/07 2135) Resp:  [15-47] 21 (12/08 0823) BP: (110-206)/(62-97) 157/73 mmHg (12/08 0823) SpO2:  [93 %-98 %] 95 % (12/08 0823) INTAKE / OUTPUT:  Intake/Output Summary (Last 24 hours) at 12/12/13 0840 Last data filed at 12/12/13 0800  Gross per 24 hour  Intake 2637.6 ml  Output   2200 ml  Net  437.6 ml    PHYSICAL EXAMINATION: General: awake, cooperative and pleasant  Neuro: Awake, able to follow commands and speak coherently. HEENT:  Pupils reactive, poor dentition Cardiovascular:  Regular, no murmur Lungs:  No wheezes or crackles, + upper airway wheeze w/ forced exhale  Abdomen:  Soft, non tender, + bowel sounds Musculoskeletal:  No edema Skin:  No rashes  LABS:  CBC  Recent Labs Lab 12/08/13 0438 12/09/13 0415 12/11/13 0355  WBC 6.9 6.4 5.9  HGB 13.0 12.8* 12.5*  HCT 40.2 39.5 38.6*  PLT 242 258 210   Coag's  Recent Labs Lab 12/06/13 1627  INR 1.00   BMET  Recent Labs Lab 12/08/13 0438 12/09/13 0415 12/11/13 0355  NA 138 135* 141  K 4.6 4.2 4.3  CL 104 102 107  CO2 23 21 21   BUN 14 10 9   CREATININE 1.03 0.96 0.89  GLUCOSE 116* 129* 112*   Electrolytes  Recent Labs Lab 12/08/13 0438  12/09/13 0415 12/11/13 0355  CALCIUM 9.7 9.7 9.6  MG 2.2  --  1.9  PHOS 2.1*  --  3.3      ASSESSMENT / PLAN:  Acute encephalopathy 2nd to alcohol withdrawal with delirium tremens. Depression, anxiety. Chronic back pain. Patient is denying any alcohol use Plan:   RASS goal 0 Added low dose clonidine Cont Thiamine, folic acid  Vicodin increased. Add low dose morphine  Increased lexapro to home dose Psych consulted, awaiting final recs     Hx of COPD, OSA, tobacco abuse. Plan:   Resume spiriva and symbicort.  Oxygen to keep SpO2 > 92% Bronchial hygiene Smoking cessation   Hx of HTN. P:  Restarted home lopressors at 50 mg PO BID with holding parameters. Monitor.   Hx of Barrett's Esophagus. Nutrition. P:   Protonix BID Regular diet    FAMILY  - Updates: No family at bedside 12/05.  NP SUMMARY Cooperative. Not in distress. Off precedex. Continues to report he has not been drinking regularly (which is not consistent w/ H&P) and that the events that brought him in were not in effort to harm himself. He reports he has significant back pain. He is ready to leave the ICU. Will cont scheduled xanax. Add low dose morphine, continue physical therapy efforts and transfer him to medsurg. Awaiting psych visit for today. Need help to figure out  best dispo here. He continues to state he does not drink regularly but on admission it was reported he cont to drink daily and he certainly did have w/d symptoms, which seemed to have subsided some.    Jesse Hartman ACNP-BC [place] Pager # [phone] OR # [phone] if no answer  Agree with above Await further psych input, now off precedx, can transfer out of ICU & to triad in am  Jesse Hartman. MD

## 2013-12-12 NOTE — Plan of Care (Signed)
Problem: Phase III Progression Outcomes Goal: Pain controlled on oral analgesia Outcome: Progressing Goal: Activity at appropriate level-compared to baseline (UP IN CHAIR FOR HEMODIALYSIS)  Outcome: Progressing Goal: Foley discontinued Outcome: Not Applicable Date Met:  59/74/16  Problem: Discharge Progression Outcomes Goal: Pain controlled with appropriate interventions Outcome: Progressing Goal: Hemodynamically stable Outcome: Progressing Goal: Tolerating diet Outcome: Completed/Met Date Met:  12/12/13 Goal: Activity appropriate for discharge plan Outcome: Progressing

## 2013-12-12 NOTE — Progress Notes (Signed)
Clinical Social Work  CSW met with patient at bedside. Patient reports he is feeling better and recently transferred from SDU. Patient reports that he worked with PT this morning and is feeling strong enough to go home. Patient reports a representative from [facility] met with him and he is going to sign up for their services as well. Patient reports he is not interested in SNF and would not consider placement because he feels he is doing too well.  CSW spoke with patient about his emotional wellbeing. Patient denies any SI or HI and reports he does not feel he needs a Actuary. Patient reports that he feels more alert today and wants to DC home as soon as possible. Patient reports that he was never trying to harm himself and does not want treatment. Patient reports that he feels that he could benefit from individual therapy and reports he has been to Intensive Outpatient Program (I[facility]) [facility] and went to [facility] for residential treatment in February 2015. Patient reports after inpatient treatment he followed up with AA meetings for several months but has not been back to [place] meetings since the summer. Patient reports that he does not want to go back to [place] meetings or IOP but feels he could benefit from individual therapy. CSW provided information for [facility] [place] for individual counseling and list of IOP at [facility] if needed. Patient reports he will also follow up with [facility] to see if they assist with services as well.  Patient agreeable for CSW to continue to follow to provide support throughout hospitalization.  [place], [place] [phone]

## 2013-12-12 NOTE — Progress Notes (Signed)
Physical Therapy Treatment Patient Details Name: Jesse Hartman MRN: [id number] DOB: 04/28/1960 Today's Date: 12/12/2013    History of Present Illness Jesse Hartman is a 53 y.o. male with a history of Alcohol Abuse, and Polysubstance Abuse who was brought to the ED 12/06/13   After he was found passed out in a neighbor's house by his daughter.  He was unresponsive and in the ED became responsive.  patient reports  taking  overdose of medication.    PT Comments    Pt was able to increase gait tolerance to 60 feet x2 with sitting rest break on RA.  Pt is min unsteady/shaky during gait and had one LOB; easily corrected.    Follow Up Recommendations  Home health PT;SNF;Supervision/Assistance - 24 hour     Equipment Recommendations  Rolling walker with 5" wheels    Recommendations for Other Services OT consult     Precautions / Restrictions Precautions Precautions: Fall Restrictions Weight Bearing Restrictions: No    Mobility  Bed Mobility               General bed mobility comments: pt seen OOB in recliner  Transfers Overall transfer level: Needs assistance Equipment used: Rolling walker (2 wheeled) Transfers: Sit to/from Stand Sit to Stand: Min guard         General transfer comment: VCs for hand placement; increased time to rise and stabilize  Ambulation/Gait Ambulation/Gait assistance: Min guard Ambulation Distance (Feet): 60 Feet (x2) Assistive device: Rolling walker (2 wheeled) Gait Pattern/deviations: Step-through pattern;Decreased stride length;Antalgic;Trunk flexed;Narrow base of support Gait velocity: decr   General Gait Details: VCs for RW management and sequencing; pt fatigued after 60 feet and required sitting rest break; pt on RA; pt had one LOB, easily corrected; pt unsteady due to weakness; high fall risk   Stairs            Wheelchair Mobility    Modified Rankin (Stroke Patients Only)       Balance                                     Cognition Arousal/Alertness: Awake/alert Behavior During Therapy: WFL for tasks assessed/performed Overall Cognitive Status: Within Functional Limits for tasks assessed                      Exercises      General Comments        Pertinent Vitals/Pain Pain Assessment: 0-10 Pain Score: 5  Pain Location: R lower back Pain Intervention(s): Monitored during session;Repositioned    Home Living                      Prior Function            PT Goals (current goals can now be found in the care plan section) Progress towards PT goals: Progressing toward goals    Frequency  Min 3X/week    PT Plan Current plan remains appropriate    Co-evaluation             End of Session Equipment Utilized During Treatment: Gait belt Activity Tolerance: Patient tolerated treatment well Patient left: in chair;with nursing/sitter in room     Time: 1660-6301 PT Time Calculation (min) (ACUTE ONLY): 25 min  Charges:  $Gait Training: 8-22 mins $Therapeutic Activity: 8-22 mins  G Codes:      Miller,Derrick, SPTA 12/12/2013, 12:46 PM  Reviewed above  Rica Koyanagi  PTA [facility]  Acute  Rehab Pager      [phone]

## 2013-12-12 NOTE — Consult Note (Signed)
Psychiatry Consult Follow Up Note  Reason for Consult:  Depression and substance abuse Referring Physician:  Dr. Hardin Negus is an 53 y.o. male. Total Time spent with patient: 15 minutes  Assessment: AXIS I:  Substance Induced Mood Disorder and Alcohol withdrawal  syndrome and alcohol dependence  AXIS II:  Deferred AXIS III:   Past Medical History  Diagnosis Date  . Hypertension   . Barrett's syndrome   . Arthritis   . Hypokalemia   . Hyponatremia   . Esophagitis   . GERD (gastroesophageal reflux disease)   . DVT (deep venous thrombosis)   . COPD (chronic obstructive pulmonary disease)   . Depression   . Substance abuse   . Hiatal Hernia 06/22/2013  . 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 Hypercalcemia   . Anxiety   . Asthma   . Blood transfusion without reported diagnosis   . Sleep apnea   . Seizures    AXIS IV:  economic problems, occupational problems, other psychosocial or environmental problems, problems related to social environment and problems with primary support group AXIS V:  51-60 moderate symptoms  Plan:  Continue Lexapro 10 mg PO QD for depression and monitor for CIWA protocol No evidence of imminent risk to self or others at present.   Patient does not meet criteria for psychiatric inpatient admission. Supportive therapy provided about ongoing stressors. Discussed crisis plan, support from social network, calling 911, coming to the Emergency Department, and calling Suicide Hotline.  Refer to psych out patient treatment at [facility] as he requested when medically stable Appreciate psychiatric consultation Please contact [phone] or [phone] if needs further assistance   Subjective:   Jesse Hartman is a 53 y.o. male patient admitted with Depression, intentional overdose and substance abuse.  HPI:  Patient seen, chart reviewed and case discussed with staff RN and Sindy Messing, LCSW. Patient has been suffering with depression,  anxiety, alcohol abuse versus dependence. Patient reportedly ran out of his money and not able to drink alcohol since Sunday. Reportedly patient was taken somebody else medication as a overdose which is intentional. Patient denies intention to harm himself or others. Patient reported his father is a 22 years old in [place] send check every month which he has been using since 2011. Patient has a 2 daughters in [place] who seems to be supportive to him. Patient has a sister in the [place] who is a Buyer, retail patient provided consent to speak with his daughter and his sister but not with his father. Patient denies auditory or visual hallucination, delusions and paranoia. Patient stated he feels that he is ready to be discharged and need to take care of his utilities and rent, and he did not realize he is in ICU with the Precedex drip. Patient was informed he is not ready to be discharged at this time and also offered substance abuse rehabilitation treatment which he refuses at this time. As per the chart patient was refusing rehabilitation treatment even in the past. Patient reported he has a multiple medical problems and surgeries in the past and he lost his ability to work since 2011 and has been receiving outpatient medication management from [place]. Patient to urine drug screen is positive for benzodiazepine.  Interval History: Patient seen today for consultation follow up. Patient has been relocated from ICU and stated that he is feeling much better without withdrawal symptoms. Patient stated that when he has no money  which made him frustrated and took xanax from another person and also taken benadryl to calm him down but denied having suicidal ideation, intention or plan. Patient states that he has received a check and able to pay his rent and feels he can go home and participate in out patient care. Patient has been compliant with his medication treatment and  detox treatment and supportive treatment without difficulties. He has no irritability, agitation and aggression today.   Past Psychiatric History: Past Medical History  Diagnosis Date  . Hypertension   . Barrett's syndrome   . Arthritis   . Hypokalemia   . Hyponatremia   . Esophagitis   . GERD (gastroesophageal reflux disease)   . DVT (deep venous thrombosis)   . COPD (chronic obstructive pulmonary disease)   . Depression   . Substance abuse   . Hiatal Hernia 06/22/2013  . 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 Hypercalcemia   . Anxiety   . Asthma   . Blood transfusion without reported diagnosis   . Sleep apnea   . Seizures     reports that he has been smoking Cigarettes.  He has a 7.5 pack-year smoking history. He has never used smokeless tobacco. He reports that he drinks about 16.8 oz of alcohol per week. He reports that he uses illicit drugs. Family History  Problem Relation Age of Onset  . Diabetes Maternal Grandfather   . Heart disease Father      Living Arrangements: Alone   Abuse/Neglect [facility]) Physical Abuse: Denies Verbal Abuse: Denies Sexual Abuse: Denies Allergies:   Allergies  Allergen Reactions  . Food Hives    Yogurt     Objective: Blood pressure 142/88, pulse 65, temperature 98.1 F (36.7 C), temperature source Oral, resp. rate 18, height $RemoveBe'6\' 2"'MXJapQbpo$  (1.88 m), weight 107.2 kg (236 lb 5.3 oz), SpO2 96 %.Body mass index is 30.33 kg/(m^2). Results for orders placed or performed during the hospital encounter of 12/06/13 (from the past 72 hour(s))  CBC     Status: Abnormal   Collection Time: 12/11/13  3:55 AM  Result Value Ref Range   WBC 5.9 4.0 - 10.5 K/uL   RBC 4.71 4.22 - 5.81 MIL/uL   Hemoglobin 12.5 (L) 13.0 - 17.0 g/dL   HCT 38.6 (L) 39.0 - 52.0 %   MCV 82.0 78.0 - 100.0 fL   MCH 26.5 26.0 - 34.0 pg   MCHC 32.4 30.0 - 36.0 g/dL   RDW 15.9 (H) 11.5 - 15.5 %   Platelets 210 150 - 400 K/uL  Basic metabolic panel     Status: Abnormal   Collection Time:  12/11/13  3:55 AM  Result Value Ref Range   Sodium 141 137 - 147 mEq/L   Potassium 4.3 3.7 - 5.3 mEq/L   Chloride 107 96 - 112 mEq/L   CO2 21 19 - 32 mEq/L   Glucose, Bld 112 (H) 70 - 99 mg/dL   BUN 9 6 - 23 mg/dL   Creatinine, Ser 0.89 0.50 - 1.35 mg/dL   Calcium 9.6 8.4 - 10.5 mg/dL   GFR calc non Af Amer >90 >90 mL/min   GFR calc Af Amer >90 >90 mL/min    Comment: (NOTE) The eGFR has been calculated using the CKD EPI equation. This calculation has not been validated in all clinical situations. eGFR's persistently <90 mL/min signify possible Chronic Kidney Disease.    Anion gap 13 5 - 15  Magnesium     Status: None   Collection Time:  12/11/13  3:55 AM  Result Value Ref Range   Magnesium 1.9 1.5 - 2.5 mg/dL  Phosphorus     Status: None   Collection Time: 12/11/13  3:55 AM  Result Value Ref Range   Phosphorus 3.3 2.3 - 4.6 mg/dL   Labs are reviewed.  Current Facility-Administered Medications  Medication Dose Route Frequency Provider Last Rate Last Dose  . albuterol (PROVENTIL) (2.5 MG/3ML) 0.083% nebulizer solution 2.5 mg  2.5 mg Nebulization Q2H PRN Chesley Mires, MD   2.5 mg at 12/11/13 0400  . ALPRAZolam Duanne Moron) tablet 0.5 mg  0.5 mg Oral TID PRN Rush Farmer, MD   0.5 mg at 12/12/13 0744  . antiseptic oral rinse (CPC / CETYLPYRIDINIUM CHLORIDE 0.05%) solution 7 mL  7 mL Mouth Rinse q12n4p Debbe Odea, MD   7 mL at 12/11/13 1627  . cloNIDine (CATAPRES) tablet 0.1 mg  0.1 mg Oral TID [name], NP   0.1 mg at 12/12/13 0955  . dexmedetomidine (PRECEDEX) 400 MCG/100ML (4 mcg/mL) infusion  0.2-0.7 mcg/kg/hr Intravenous Titrated [name], NP   Stopped at 12/11/13 2100  . dextrose 5 %-0.9 % sodium chloride infusion   Intravenous Continuous Chesley Mires, MD 75 mL/hr at 12/10/13 1645    . enoxaparin (LOVENOX) injection 40 mg  40 mg Subcutaneous Q24H Theressa Millard, MD   40 mg at 12/11/13 2134  . escitalopram (LEXAPRO) tablet 10 mg  10 mg Oral Daily Rush Farmer,  MD   10 mg at 12/12/13 0956  . folic acid (FOLVITE) tablet 1 mg  1 mg Oral Daily Kara Mead, [place]   1 mg at 12/12/13 9024  . hydrALAZINE (APRESOLINE) injection 10 mg  10 mg Intravenous Q2H PRN Colbert Coyer, MD   10 mg at 12/12/13 0973  . HYDROcodone-acetaminophen (NORCO) 10-325 MG per tablet 2 tablet  2 tablet Oral Q6H PRN [name], NP   2 tablet at 12/12/13 1247  . Influenza vac split quadrivalent PF (FLUARIX) injection 0.5 mL  0.5 mL Intramuscular Tomorrow-1000 Saima Rizwan, MD   0.5 mL at 12/09/13 1000  . ipratropium-albuterol (DUONEB) 0.5-2.5 (3) MG/3ML nebulizer solution 3 mL  3 mL Nebulization QID Collene Gobble, MD   3 mL at 12/12/13 1534  . LORazepam (ATIVAN) injection 1-2 mg  1-2 mg Intravenous Q1H PRN Rush Farmer, MD   2 mg at 12/12/13 0409  . LORazepam (ATIVAN) tablet 1 mg  1 mg Oral TID Rigoberto Noel, MD   1 mg at 12/12/13 0955  . metoprolol tartrate (LOPRESSOR) tablet 50 mg  50 mg Oral BID Rush Farmer, MD   50 mg at 12/12/13 0954  . morphine 2 MG/ML injection 2 mg  2 mg Intravenous Q3H PRN [name], NP   2 mg at 12/12/13 1525  . ondansetron (ZOFRAN) injection 4 mg  4 mg Intravenous Q6H PRN Theressa Millard, MD      . pantoprazole (PROTONIX) EC tablet 40 mg  40 mg Oral BID [name], NP   40 mg at 12/12/13 0955  . thiamine (VITAMIN B-1) tablet 100 mg  100 mg Oral Daily Kara Mead, [place]   100 mg at 12/12/13 5329    Psychiatric Specialty Exam: Physical Exam as per history and physical   ROS depression, and psychosocial stressors   Blood pressure 142/88, pulse 65, temperature 98.1 F (36.7 C), temperature source Oral, resp. rate 18, height $RemoveBe'6\' 2"'PyrTCQFzO$  (1.88 m), weight 107.2  kg (236 lb 5.3 oz), SpO2 96 %.Body mass index is 30.33 kg/(m^2).  General Appearance: Casual  Eye Contact::  Good  Speech:  Clear and Coherent  Volume:  Normal  Mood:  Euthymic  Affect:  Appropriate and Congruent  Thought Process:  Coherent and Goal Directed   Orientation:  Full (Time, Place, and Person)  Thought Content:  WDL  Suicidal Thoughts:  No  Homicidal Thoughts:  No  Memory:  Immediate;   Good Recent;   Good  Judgement:  Intact  Insight:  Good  Psychomotor Activity:  Normal  Concentration:  Good  Recall:  Good  Fund of Knowledge:Good  Language: Good  Akathisia:  NA  Handed:  Right  AIMS (if indicated):     Assets:  Communication Skills Housing Intimacy Leisure Time Resilience Social Support  Sleep:      Musculoskeletal: Strength & Muscle Tone: within normal limits Gait & Station: unable to stand Patient leans: N/A  Treatment Plan Summary: Daily contact with patient to assess and evaluate symptoms and progress in treatment Medication management  Continue current supportive treatment and lexapro 10 mg for depression Will be referred to the psychiatric and substance abuse outpatient treatment when medically stable  Riki Berninger,JANARDHAHA R. 12/12/2013 4:07 PM

## 2013-12-13 DIAGNOSIS — M545 Low back pain: Secondary | ICD-10-CM

## 2013-12-13 DIAGNOSIS — F1994 Other psychoactive substance use, unspecified with psychoactive substance-induced mood disorder: Secondary | ICD-10-CM

## 2013-12-13 DIAGNOSIS — I10 Essential (primary) hypertension: Secondary | ICD-10-CM

## 2013-12-13 MED ORDER — THIAMINE HCL 100 MG PO TABS: 100.0000 mg | ORAL_TABLET | Freq: Every day | ORAL | Status: AC

## 2013-12-13 MED ORDER — LORAZEPAM 1 MG PO TABS
1.0000 mg | ORAL_TABLET | Freq: Three times a day (TID) | ORAL | Status: AC | PRN
Start: 2013-12-13 — End: ?

## 2013-12-13 MED ORDER — ESCITALOPRAM OXALATE 10 MG PO TABS: 10.0000 mg | ORAL_TABLET | Freq: Every day | ORAL | Status: DC

## 2013-12-13 MED ORDER — HYDROCODONE-ACETAMINOPHEN 10-325 MG PO TABS: 2.0000 | ORAL_TABLET | Freq: Four times a day (QID) | ORAL | Status: DC | PRN

## 2013-12-13 MED ORDER — ALPRAZOLAM 1 MG PO TABS: 1.0000 mg | ORAL_TABLET | Freq: Every day | ORAL | Status: DC | PRN

## 2013-12-13 MED ORDER — FOLIC ACID 1 MG PO TABS: 1.0000 mg | ORAL_TABLET | Freq: Every day | ORAL | Status: AC

## 2013-12-13 MED ORDER — LORAZEPAM 1 MG PO TABS: 1.0000 mg | ORAL_TABLET | Freq: Three times a day (TID) | ORAL | Status: DC

## 2013-12-13 MED ORDER — AMLODIPINE-OLMESARTAN 10-40 MG PO TABS: 1.0000 | ORAL_TABLET | Freq: Every day | ORAL | Status: DC

## 2013-12-13 NOTE — Progress Notes (Signed)
Physical Therapy Treatment Patient Details Name: WINFERD WEASE MRN: [id number] DOB: 11-27-1960 Today's Date: 12/13/2013    History of Present Illness DALTON MOLESWORTH is a 53 y.o. male with a history of Alcohol Abuse, and Polysubstance Abuse who was brought to the ED 12/06/13   After he was found passed out in a neighbor's house by his daughter.  He was unresponsive and in the ED became responsive.  patient reports  taking  overdose of medication.    PT Comments    Pt was able to complete stair training and gait of 40 feet x2 this tx.  Pt demo increased safety and steadiness with RW compared to [facility]; pt agreeable to order and use RW temporarily.    Follow Up Recommendations  Home health PT;SNF;Supervision/Assistance - 24 hour     Equipment Recommendations  Rolling walker with 5" wheels    Recommendations for Other Services OT consult     Precautions / Restrictions Precautions Precautions: Fall Restrictions Weight Bearing Restrictions: No    Mobility  Bed Mobility Overal bed mobility: Needs Assistance Bed Mobility: Supine to Sit     Supine to sit: Supervision        Transfers Overall transfer level: Needs assistance Equipment used: Straight cane;Rolling walker (2 wheeled) Transfers: Sit to/from Stand Sit to Stand: Min guard         General transfer comment: pt much safer with RW compared to [facility]  Ambulation/Gait Ambulation/Gait assistance: Min guard Ambulation Distance (Feet): 40 Feet (x2) Assistive device: Straight cane;Rolling walker (2 wheeled) Gait Pattern/deviations: Step-through pattern;Decreased stride length;Narrow base of support Gait velocity: decr   General Gait Details: pt demo unsafe, unsteady gait with STC; pt compliant with using RW temporarily; gait much improved with RW; nursing informed of DME order need.   Stairs Stairs: Yes Stairs assistance: Min guard Stair Management: One rail Right;Step to pattern;Forwards;With cane Number of Stairs:  4 General stair comments: VCs for sequencing  Wheelchair Mobility    Modified Rankin (Stroke Patients Only)       Balance                                    Cognition Arousal/Alertness: Awake/alert Behavior During Therapy: WFL for tasks assessed/performed Overall Cognitive Status: Within Functional Limits for tasks assessed                      Exercises      General Comments        Pertinent Vitals/Pain Pain Assessment: 0-10 Pain Score: 4  Pain Location: R lower back and leg Pain Intervention(s): Limited activity within patient's tolerance;Monitored during session;Repositioned    Home Living                      Prior Function            PT Goals (current goals can now be found in the care plan section) Progress towards PT goals: Progressing toward goals    Frequency  Min 3X/week    PT Plan Current plan remains appropriate    Co-evaluation             End of Session Equipment Utilized During Treatment: Gait belt Activity Tolerance: Patient tolerated treatment well Patient left: in chair;with call bell/phone within reach     Time: 1111-1140 PT Time Calculation (min) (ACUTE ONLY): 29 min  Charges:  G Codes:      Miller,Derrick, SPTA 12/13/2013, 12:23 PM   Reviewed above  Rica Koyanagi  PTA [facility]  Acute  Rehab Pager      [phone]

## 2013-12-13 NOTE — Discharge Summary (Signed)
Physician Discharge Summary  Jesse Hartman ZOX:[id number] DOB: [birth year] DOA: 12/06/2013  PCP: Chesley Noon, MD  Admit date: 12/06/2013 Discharge date: 12/13/2013  Time spent: 35 minutes  Recommendations for Outpatient Follow-up:  1. D/C Home with PCP follow up in 1 week. Patient reports he will schedule an appt 2. Resources provided for outpt follow up   Discharge Diagnoses:  Principal Problem:   Acute encephalopathy secondary to  Substance overdose  Active Problems:   HTN (hypertension)   COPD (chronic obstructive pulmonary disease)   A?lcohol withdrawal   Overdose   AKI (acute kidney injury)   Back pain   Barrett's esophagus   Atelectasis   Polysubstance abuse   Essential hypertension   Mood disorder, drug-induced   Discharge Condition: fair  Diet recommendation: low sodium  Filed Weights   12/06/13 2146 12/08/13 0400 12/09/13 0323  Weight: 108.3 kg (238 lb 12.1 oz) 106.2 kg (234 lb 2.1 oz) 107.2 kg (236 lb 5.3 oz)    History of present illness:  53 year old male with history of: Abuse, polysubstance abuse, hypertension, history of COPD, depression, substance abuse most disorder GERD who was brought to the ED his daughter after he passed out in the neighborhood. He was unresponsive in the ED and on waking up was reported that he had taken some pills including Xanax, Neurontin and a yellow pill. He reports that he drinks inconsistently and had not had her drinking almost 2 months. His alcohol level was negative. Urine drug screen was positive for benzos only. He denied having any suicidal ideations or taking the medications in order to harm himself. On 12/3 with abrupt DTs and was transferred to ICU and placed on Precedex drip  Hospital Course:  Acute encephalopathy secondary to polysubstance abuse /alcohol withdrawal with DTs. Patient admitted to ICU on the second day of admission and placed on Precedex drip. Patient denied any alcohol use. Added low dose  clonidine in the ICU. Continue thiamine, folic acid. Patient off Precedex drip and  transferred to medical floor. His last CIWA score is 3. He will be discharged on a short course of necessary Ativan for anxiety and withdrawal symptoms., thiamine and folate and follow up with his PCP in 1 week.  Substance-induced mood disorder Patient seen by psychiatry and recommended no need for inpatient therapy. Commended to resume his home dose of Lexapro. Patient does not wish for residential substance abuse treatment  since he has significantly reduced his alcohol intake. Reports he has been to intensive outpatient program at [facility] and also went [facility] for residential treatment in February 2015. He also had been followed up with several AA meetings until the summer of this year. He does not want to go to further meetings and thinks he would benefit from individual therapy.   Chronic back pain Given prescription for Vicodin. Patient reports because of his insurance issues he has not been able to get outpatient PT as well as MRI. His PCP has made referrals to outpatient therapy. Seen by physical therapy and recommend rolling walker with 5 inch wheels. Will arrange home health RN for weekly visits  GERD Continue twice a day PPI  Uncontrolled hypertension Blood pressure is elevated likely in the setting of alcohol withdrawal symptoms. Resume home dose of metoprolol  COPD Stable. Continue home inhalers.  Procedures:  None  Consultations:  PC CM  Psychiatry  Discharge Exam: Filed Vitals:   12/13/13 0622  BP: 161/79  Pulse: 71  Temp: 97.8 F (36.6  C)  Resp: 18    General: Related male in no acute distress HEENT: No pallor, moist oral mucosa Chest: Clear to auscultation bilaterally, no added sounds CVS: Normal S1 and S2, no murmurs Abdomen: Soft, nondistended, nontender, bowel sounds present Extremities: Warm, no edema CNS: Alert and oriented, mild  tremors    Discharge Instructions You were cared for by a hospitalist during your hospital stay. If you have any questions about your discharge medications or the care you received while you were in the hospital after you are discharged, you can call the unit and asked to speak with the hospitalist on call if the hospitalist that took care of you is not available. Once you are discharged, your primary care physician will handle any further medical issues. Please note that NO REFILLS for any discharge medications will be authorized once you are discharged, as it is imperative that you return to your primary care physician (or establish a relationship with a primary care physician if you do not have one) for your aftercare needs so that they can reassess your need for medications and monitor your lab values.   Current Discharge Medication List    START taking these medications   Details       folic acid (FOLVITE) 1 MG tablet Take 1 tablet (1 mg total) by mouth daily. Qty: 30 tablet, Refills: 0    HYDROcodone-acetaminophen (NORCO) 10-325 MG per tablet Take 2 tablets by mouth every 6 (six) hours as needed for moderate pain. Qty: 30 tablet, Refills: 0          LORazepam (ATIVAN) 1 MG tablet Take 1 tablet (1 mg total) by mouth every 8 (eight) hours as needed for anxiety. Qty: 30 tablet, Refills: 0    thiamine 100 MG tablet Take 1 tablet (100 mg total) by mouth daily. Qty: 30 tablet, Refills: 0        CONTINUE these medications which have CHANGED   Details  ALPRAZolam (XANAX) 1 MG tablet Take 1 tablet (1 mg total) by mouth daily as needed for anxiety (anxiety). Qty: 30 tablet, Refills: 0    amLODipine-olmesartan (AZOR) 10-40 MG per tablet Take 1 tablet by mouth daily. Lot [id number]  Exp 04/06/2015 Qty: 30 tablet, Refills: 0      CONTINUE these medications which have NOT CHANGED   Details  albuterol (PROVENTIL HFA;VENTOLIN HFA) 108 (90 BASE) MCG/ACT inhaler Inhale 2 puffs into the  lungs every 6 (six) hours as needed for wheezing or shortness of breath (copd).     budesonide-formoterol (SYMBICORT) 160-4.5 MCG/ACT inhaler Inhale 2 puffs into the lungs daily.    cyclobenzaprine (FLEXERIL) 10 MG tablet Take 1 tablet (10 mg total) by mouth 2 (two) times daily as needed for muscle spasms. Qty: 20 tablet, Refills: 0    escitalopram (LEXAPRO) 10 MG tablet Take 10 mg by mouth daily.    metoprolol succinate (TOPROL-XL) 50 MG 24 hr tablet Take 50 mg by mouth daily. Take with or immediately following a meal.    Omega-3 Fatty Acids (FISH OIL) 1000 MG CAPS Take 1,000 mg by mouth daily.    pantoprazole (PROTONIX) 40 MG tablet Take 1 tablet (40 mg total) by mouth 2 (two) times daily. Qty: 60 tablet, Refills: 2    Tiotropium Bromide Monohydrate (SPIRIVA RESPIMAT) 2.5 MCG/ACT AERS Inhale 1 capsule into the lungs 2 (two) times daily.    Discontinued these meds Xanax 1 mg      Allergies  Allergen Reactions  . Food Hives  Yogurt    Follow-up Information    Follow up with BADGER,MICHAEL C, MD. Schedule an appointment as soon as possible for a visit in 1 week.   Specialty:  Family Medicine   Contact information:   [place] [address] [phone]        The results of significant diagnostics from this hospitalization (including imaging, microbiology, ancillary and laboratory) are listed below for reference.    Significant Diagnostic Studies: Ct Head Wo Contrast  12/06/2013   CLINICAL DATA:  [place] yesterday, now presents with altered mental status, ethanol intoxication, history hypertension, COPD, asthma, seizures, smoking  EXAM: CT HEAD WITHOUT CONTRAST  TECHNIQUE: Contiguous axial images were obtained from the base of the skull through the vertex without intravenous contrast.  COMPARISON:  01/19/2013  FINDINGS: Motion artifacts despite repeating images, limiting exam.  Atrophy, greatest posteriorly.  Normal ventricular morphology.  No midline shift  or mass effect.  Within limitations imposed by motion, no gross evidence of intracranial hemorrhage, mass lesion or acute infarction.  No obvious extra-axial fluid collections.  Suboptimal osseous assessment, no gross abnormality seen.  Sinuses appear clear.  IMPRESSION: Examination limited by patient motion despite repeating images, with no gross acute intracranial abnormality identified.   Electronically Signed   By: Lavonia Dana M.D.   On: 12/06/2013 16:57   Dg Chest Port 1 View  12/07/2013   CLINICAL DATA:  Atelectasis, history hypertension, GERD, asthma  EXAM: PORTABLE CHEST - 1 VIEW  COMPARISON:  Portable exam 2595 hr compared to 12/06/2013  FINDINGS: Borderline enlargement of cardiac silhouette.  Rotated to the LEFT.  Mediastinal contours and pulmonary vascularity normal.  Bronchitic changes with increased bibasilar atelectasis since previous study.  No gross infiltrate identified.  Small LEFT pleural effusion.  LEFT clavicular deformity from old fracture.  IMPRESSION: Bronchitic changes with increased bibasilar atelectasis and small LEFT pleural effusion.   Electronically Signed   By: Lavonia Dana M.D.   On: 12/07/2013 18:27   Dg Chest Port 1 View  12/06/2013   CLINICAL DATA:  Acute chest pain. Hypertension. History of smoking.  EXAM: PORTABLE CHEST - 1 VIEW  COMPARISON:  10/11/2013  FINDINGS: The cardiac silhouette, mediastinal and hilar contours are within normal limits and stable. Bibasilar scarring changes but no infiltrates, edema or effusions. The bony thorax is intact.  IMPRESSION: Bibasilar scarring but no acute pulmonary findings.   Electronically Signed   By: Kalman Jewels M.D.   On: 12/06/2013 16:51    Microbiology: Recent Results (from the past 240 hour(s))  MRSA PCR Screening     Status: None   Collection Time: 12/06/13  9:43 PM  Result Value Ref Range Status   MRSA by PCR NEGATIVE NEGATIVE Final    Comment:        The GeneXpert MRSA Assay (FDA approved for NASAL  specimens only), is one component of a comprehensive MRSA colonization surveillance program. It is not intended to diagnose MRSA infection nor to guide or monitor treatment for MRSA infections.      Labs: Basic Metabolic Panel:  Recent Labs Lab 12/06/13 1627 12/07/13 0406 12/08/13 0438 12/09/13 0415 12/11/13 0355  NA 132* 132* 138 135* 141  K 3.7 3.9 4.6 4.2 4.3  CL 92* 96 104 102 107  CO2 22 20 23 21 21   GLUCOSE 90 95 116* 129* 112*  BUN 17 19 14 10 9   CREATININE 2.56* 2.01* 1.03 0.96 0.89  CALCIUM 9.9 9.2 9.7 9.7 9.6  MG  --   --  2.2  --  1.9  PHOS  --   --  2.1*  --  3.3   Liver Function Tests:  Recent Labs Lab 12/06/13 1627 12/08/13 0438  AST 24 20  ALT 21 17  ALKPHOS 79 67  BILITOT 0.3 0.3  PROT 7.8 6.7  ALBUMIN 4.0 3.4*    Recent Labs Lab 12/06/13 1627  LIPASE 24    Recent Labs Lab 12/06/13 1815  AMMONIA 11   CBC:  Recent Labs Lab 12/06/13 1627 12/07/13 0406 12/08/13 0438 12/09/13 0415 12/11/13 0355  WBC 7.2 6.5 6.9 6.4 5.9  NEUTROABS 4.7  --   --   --   --   HGB 13.8 12.5* 13.0 12.8* 12.5*  HCT 41.7 38.0* 40.2 39.5 38.6*  MCV 81.3 82.1 81.9 81.3 82.0  PLT 263 235 242 258 210   Cardiac Enzymes: No results for input(s): CKTOTAL, CKMB, CKMBINDEX, TROPONINI in the last 168 hours. BNP: BNP (last 3 results)  Recent Labs  12/06/13 1629  PROBNP 80.0   CBG: No results for input(s): GLUCAP in the last 168 hours.     SignedLouellen Molder  [facility] 12/13/2013, 10:55 AM

## 2013-12-13 NOTE — Plan of Care (Signed)
Problem: Discharge Progression Outcomes Goal: Discharge plan in place and appropriate Outcome: Completed/Met Date Met:  12/13/13 Goal: Pain controlled with appropriate interventions Outcome: Completed/Met Date Met:  12/13/13 Goal: Hemodynamically stable Outcome: Completed/Met Date Met:  12/21/22 Goal: Complications resolved/controlled Outcome: Completed/Met Date Met:  12/13/13 Goal: Activity appropriate for discharge plan Outcome: Completed/Met Date Met:  12/13/13 Goal: Other Discharge Outcomes/Goals Outcome: Completed/Met Date Met:  12/13/13

## 2013-12-13 NOTE — Plan of Care (Signed)
Problem: Phase I Progression Outcomes Goal: OOB as tolerated unless otherwise ordered Outcome: Completed/Met Date Met:  12/13/13 Goal: Initial discharge plan identified Outcome: Completed/Met Date Met:  12/13/13     

## 2013-12-13 NOTE — Progress Notes (Signed)
Discharge instructions given to pt, verbalized understanding. Left the unit in stable condition. 

## 2013-12-13 NOTE — Progress Notes (Signed)
Clinical Social Work  CSW met with patient at bedside in order to follow up on DC plans. Patient reports he is supposed to DC today and is anxious to leave. Patient reports that dtr lives nearby and will come to transport him home. Patient reports he has been speaking with sister who is recommending residential substance abuse treatment but patient is not agreeable. Patient reports he does not feel he needs treatment for substance use because he has reduced his alcohol intake greatly. Patient reports that sister "means well but can be too pushy at times." Patient reports that he plans on following up with [facility] [place] for individual counseling but has no further SA treatment needs.  Patient had expressed concern to MD about utility problems. Patient reports he does not currently have heat but has a small apartment and will just use a space heater. Patient reports that he has electricity and family is working to pay bills. Patient reports that rent and phone bill have already been paid but he needs to pay his Direct TV bill. CSW explained that emergency assistance does not usually cover cable bills but patient could use emergency assistance list for electric/heat/rent/etc.  Patient thanked CSW for assistance but reports no further needs.  [place], [place] [phone]

## 2014-07-20 ENCOUNTER — Other Ambulatory Visit: Payer: Self-pay | Admitting: Neurological Surgery

## 2014-07-31 ENCOUNTER — Encounter (HOSPITAL_COMMUNITY): Payer: Self-pay

## 2014-07-31 ENCOUNTER — Encounter (HOSPITAL_COMMUNITY)
Admission: RE | Admit: 2014-07-31 | Discharge: 2014-07-31 | Disposition: A | Discharge status: Home / Self Care | Payer: Medicaid Other | Source: Ambulatory Visit | Attending: Neurological Surgery | Admitting: Neurological Surgery

## 2014-07-31 DIAGNOSIS — I10 Essential (primary) hypertension: Secondary | ICD-10-CM | POA: Insufficient documentation

## 2014-07-31 DIAGNOSIS — Z0183 Encounter for blood typing: Secondary | ICD-10-CM | POA: Diagnosis not present

## 2014-07-31 DIAGNOSIS — M4310 Spondylolisthesis, site unspecified: Secondary | ICD-10-CM | POA: Diagnosis not present

## 2014-07-31 DIAGNOSIS — Z01812 Encounter for preprocedural laboratory examination: Secondary | ICD-10-CM | POA: Insufficient documentation

## 2014-07-31 LAB — COMPREHENSIVE METABOLIC PANEL
ALK PHOS: 57 U/L (ref 38–126)
ALT: 39 U/L (ref 17–63)
ANION GAP: 13 (ref 5–15)
AST: 32 U/L (ref 15–41)
Albumin: 4.3 g/dL (ref 3.5–5.0)
BUN: 13 mg/dL (ref 6–20)
CO2: 17 mmol/L — ABNORMAL LOW (ref 22–32)
Calcium: 9.3 mg/dL (ref 8.9–10.3)
Chloride: 100 mmol/L — ABNORMAL LOW (ref 101–111)
Creatinine, Ser: 1.34 mg/dL — ABNORMAL HIGH (ref 0.61–1.24)
GFR calc Af Amer: >60 mL/min (ref >60)
GFR calc non Af Amer: 59 mL/min — ABNORMAL LOW (ref >60)
Glucose, Bld: 75 mg/dL (ref 65–99)
Potassium: 4.4 mmol/L (ref 3.5–5.1)
Sodium: 130 mmol/L — ABNORMAL LOW (ref 135–145)
Total Bilirubin: 1 mg/dL (ref 0.3–1.2)
Total Protein: 7.7 g/dL (ref 6.5–8.1)

## 2014-07-31 LAB — CBC
HEMATOCRIT: 47.3 % (ref 39.0–52.0)
HEMOGLOBIN: 17 g/dL (ref 13.0–17.0)
MCH: 31.9 pg (ref 26.0–34.0)
MCHC: 35.9 g/dL (ref 30.0–36.0)
MCV: 88.7 fL (ref 78.0–100.0)
PLATELETS: 255 10*3/uL (ref 150–400)
RBC: 5.33 MIL/uL (ref 4.22–5.81)
RDW: 15 % (ref 11.5–15.5)
WBC: 10.1 10*3/uL (ref 4.0–10.5)

## 2014-07-31 LAB — SURGICAL PCR SCREEN
MRSA, PCR: NEGATIVE
Staphylococcus aureus: NEGATIVE

## 2014-07-31 LAB — TYPE AND SCREEN
ABO/RH(D): A POS
ANTIBODY SCREEN: NEGATIVE

## 2014-07-31 NOTE — Pre-Procedure Instructions (Signed)
Jesse Hartman  07/31/2014      [id number] [address], [place] [address] Phone: [phone] Fax: [phone]    Your procedure is scheduled on Monday, August 1st   Report to [facility] Admitting at 7:30 AM             (Arrival time is per your surgeon's request)   Call this number if you have problems the morning of surgery:  [phone]   Remember:  Do not eat food or drink liquids after midnight Sunday.  Take these medicines the morning of surgery with A SIP OF WATER : Norvasc, Pain Medication (if needed), Ativan (if needed), Toprol-XL, Protonix.             Please use your inhalers & nebulizer the morning of surgery.                         Please STOP taking any anti-inflammatories, herbal medications and supplements 4-5 days prior to surgery.   Do not wear jewelry - no rings or watches.  Do not wear lotions or colognes.   You may NOT wear deodorant the morning of surgery.             Men may shave face and neck.   Do not bring valuables to the hospital.  [facility] is not responsible for any belongings or valuables.  Contacts, dentures or bridgework may not be worn into surgery.  Leave your suitcase in the car.  After surgery it may be brought to your room. For patients admitted to the hospital, discharge time will be determined by your treatment team.  Name and phone number of your driver:     Special instructions:  "Preparing for Surgery" instruction sheet.  Please read over the following fact sheets that you were given. Pain Booklet, Coughing and Deep Breathing, Blood Transfusion Information, MRSA Information and Surgical Site Infection Prevention

## 2014-08-06 ENCOUNTER — Inpatient Hospital Stay (HOSPITAL_COMMUNITY): Payer: Medicaid Other | Admitting: Certified Registered Nurse Anesthetist

## 2014-08-06 ENCOUNTER — Inpatient Hospital Stay (HOSPITAL_COMMUNITY): Payer: Medicaid Other

## 2014-08-06 ENCOUNTER — Inpatient Hospital Stay (HOSPITAL_COMMUNITY)
Admission: RE | Admit: 2014-08-06 | Discharge: 2014-08-09 | DRG: 460 | Disposition: A | Discharge status: Home / Self Care | Payer: Medicaid Other | Source: Ambulatory Visit | Attending: Neurological Surgery | Admitting: Neurological Surgery

## 2014-08-06 ENCOUNTER — Encounter (HOSPITAL_COMMUNITY): Payer: Self-pay | Admitting: *Deleted

## 2014-08-06 ENCOUNTER — Encounter (HOSPITAL_COMMUNITY)
Admission: RE | Disposition: A | Discharge status: Home / Self Care | Payer: Medicaid Other | Source: Ambulatory Visit | Attending: Neurological Surgery

## 2014-08-06 DIAGNOSIS — R339 Retention of urine, unspecified: Secondary | ICD-10-CM | POA: Diagnosis not present

## 2014-08-06 DIAGNOSIS — M7130 Other bursal cyst, unspecified site: Secondary | ICD-10-CM | POA: Diagnosis present

## 2014-08-06 DIAGNOSIS — M4316 Spondylolisthesis, lumbar region: Secondary | ICD-10-CM | POA: Diagnosis present

## 2014-08-06 DIAGNOSIS — M4326 Fusion of spine, lumbar region: Secondary | ICD-10-CM

## 2014-08-06 DIAGNOSIS — M21372 Foot drop, left foot: Secondary | ICD-10-CM | POA: Diagnosis present

## 2014-08-06 DIAGNOSIS — M5416 Radiculopathy, lumbar region: Secondary | ICD-10-CM | POA: Diagnosis present

## 2014-08-06 DIAGNOSIS — I10 Essential (primary) hypertension: Secondary | ICD-10-CM | POA: Diagnosis present

## 2014-08-06 DIAGNOSIS — Z419 Encounter for procedure for purposes other than remedying health state, unspecified: Secondary | ICD-10-CM

## 2014-08-06 DIAGNOSIS — M549 Dorsalgia, unspecified: Secondary | ICD-10-CM | POA: Diagnosis present

## 2014-08-06 DIAGNOSIS — Z79899 Other long term (current) drug therapy: Secondary | ICD-10-CM

## 2014-08-06 DIAGNOSIS — F17210 Nicotine dependence, cigarettes, uncomplicated: Secondary | ICD-10-CM | POA: Diagnosis present

## 2014-08-06 DIAGNOSIS — M4806 Spinal stenosis, lumbar region: Secondary | ICD-10-CM | POA: Diagnosis present

## 2014-08-06 DIAGNOSIS — J449 Chronic obstructive pulmonary disease, unspecified: Secondary | ICD-10-CM | POA: Diagnosis present

## 2014-08-06 SURGERY — POSTERIOR LUMBAR FUSION 1 LEVEL
Anesthesia: General | Site: Back

## 2014-08-06 MED ORDER — HYDROXYZINE HCL 25 MG PO TABS: 25.0000 mg | ORAL_TABLET | Freq: Four times a day (QID) | ORAL | Status: DC | PRN

## 2014-08-06 MED ORDER — TIOTROPIUM BROMIDE MONOHYDRATE 18 MCG IN CAPS
18.0000 ug | ORAL_CAPSULE | Freq: Every day | RESPIRATORY_TRACT | Status: DC
  Filled 2014-08-06: qty 5

## 2014-08-06 MED ORDER — BUDESONIDE-FORMOTEROL FUMARATE 160-4.5 MCG/ACT IN AERO
2.0000 | INHALATION_SPRAY | Freq: Every day | RESPIRATORY_TRACT | Status: DC
  Administered 2014-08-09: 2 via RESPIRATORY_TRACT
  Filled 2014-08-06: qty 6

## 2014-08-06 MED ORDER — THROMBIN 20000 UNITS EX SOLR
CUTANEOUS | Status: DC | PRN
  Administered 2014-08-06: 20 mL via TOPICAL

## 2014-08-06 MED ORDER — ACETAMINOPHEN 650 MG RE SUPP: 650.0000 mg | RECTAL | Status: DC | PRN

## 2014-08-06 MED ORDER — PROPOFOL 10 MG/ML IV BOLUS
INTRAVENOUS | Status: DC | PRN
  Administered 2014-08-06: 200 mg via INTRAVENOUS

## 2014-08-06 MED ORDER — MORPHINE SULFATE 2 MG/ML IJ SOLN
1.0000 mg | INTRAMUSCULAR | Status: DC | PRN
  Administered 2014-08-06 – 2014-08-07 (×6): 4 mg via INTRAVENOUS
  Administered 2014-08-08 (×2): 2 mg via INTRAVENOUS
  Administered 2014-08-08: 4 mg via INTRAVENOUS
  Filled 2014-08-06 (×5): qty 2
  Filled 2014-08-06: qty 1
  Filled 2014-08-06 (×2): qty 2
  Filled 2014-08-06: qty 1

## 2014-08-06 MED ORDER — AMLODIPINE BESYLATE 10 MG PO TABS
10.0000 mg | ORAL_TABLET | Freq: Every day | ORAL | Status: DC
  Administered 2014-08-07 – 2014-08-09 (×3): 10 mg via ORAL
  Filled 2014-08-06 (×3): qty 1

## 2014-08-06 MED ORDER — CYCLOBENZAPRINE HCL 10 MG PO TABS
ORAL_TABLET | ORAL | Status: AC
  Filled 2014-08-06: qty 1

## 2014-08-06 MED ORDER — SODIUM CHLORIDE 0.9 % IJ SOLN: 3.0000 mL | INTRAMUSCULAR | Status: DC | PRN

## 2014-08-06 MED ORDER — 0.9 % SODIUM CHLORIDE (POUR BTL) OPTIME
TOPICAL | Status: DC | PRN
  Administered 2014-08-06: 1000 mL

## 2014-08-06 MED ORDER — ACETAMINOPHEN 325 MG PO TABS: 650.0000 mg | ORAL_TABLET | ORAL | Status: DC | PRN

## 2014-08-06 MED ORDER — FENTANYL CITRATE (PF) 100 MCG/2ML IJ SOLN
INTRAMUSCULAR | Status: DC | PRN
  Administered 2014-08-06: 150 ug via INTRAVENOUS
  Administered 2014-08-06 (×2): 50 ug via INTRAVENOUS

## 2014-08-06 MED ORDER — CEFAZOLIN SODIUM-DEXTROSE 2-3 GM-% IV SOLR
INTRAVENOUS | Status: AC
  Filled 2014-08-06: qty 50

## 2014-08-06 MED ORDER — OXYCODONE-ACETAMINOPHEN 5-325 MG PO TABS
1.0000 | ORAL_TABLET | ORAL | Status: DC | PRN
  Administered 2014-08-06 (×2): 1 via ORAL
  Administered 2014-08-07 – 2014-08-09 (×10): 2 via ORAL
  Filled 2014-08-06 (×12): qty 2

## 2014-08-06 MED ORDER — LACTATED RINGERS IV SOLN
INTRAVENOUS | Status: DC
  Administered 2014-08-06: 09:00:00 via INTRAVENOUS

## 2014-08-06 MED ORDER — PANTOPRAZOLE SODIUM 40 MG PO TBEC
40.0000 mg | DELAYED_RELEASE_TABLET | Freq: Two times a day (BID) | ORAL | Status: DC
  Administered 2014-08-06 – 2014-08-09 (×6): 40 mg via ORAL
  Filled 2014-08-06 (×6): qty 1

## 2014-08-06 MED ORDER — MIDAZOLAM HCL 2 MG/2ML IJ SOLN
INTRAMUSCULAR | Status: AC
  Filled 2014-08-06: qty 2

## 2014-08-06 MED ORDER — KETOROLAC TROMETHAMINE 15 MG/ML IJ SOLN
INTRAMUSCULAR | Status: AC
  Filled 2014-08-06: qty 1

## 2014-08-06 MED ORDER — NEOSTIGMINE METHYLSULFATE 10 MG/10ML IV SOLN
INTRAVENOUS | Status: DC | PRN
  Administered 2014-08-06: 5 mg via INTRAVENOUS

## 2014-08-06 MED ORDER — HYDROMORPHONE HCL 1 MG/ML IJ SOLN
INTRAMUSCULAR | Status: AC
  Filled 2014-08-06: qty 1

## 2014-08-06 MED ORDER — MIDAZOLAM HCL 5 MG/5ML IJ SOLN
INTRAMUSCULAR | Status: DC | PRN
  Administered 2014-08-06: 2 mg via INTRAVENOUS

## 2014-08-06 MED ORDER — MENTHOL 3 MG MT LOZG
1.0000 | LOZENGE | OROMUCOSAL | Status: DC | PRN
Start: 2014-08-06 — End: 2014-08-09

## 2014-08-06 MED ORDER — ONDANSETRON HCL 4 MG/2ML IJ SOLN
INTRAMUSCULAR | Status: DC | PRN
  Administered 2014-08-06: 4 mg via INTRAVENOUS

## 2014-08-06 MED ORDER — TIOTROPIUM BROMIDE MONOHYDRATE 2.5 MCG/ACT IN AERS: 1.0000 | INHALATION_SPRAY | Freq: Two times a day (BID) | RESPIRATORY_TRACT | Status: DC

## 2014-08-06 MED ORDER — PHENYLEPHRINE 40 MCG/ML (10ML) SYRINGE FOR IV PUSH (FOR BLOOD PRESSURE SUPPORT)
PREFILLED_SYRINGE | INTRAVENOUS | Status: AC
  Filled 2014-08-06: qty 10

## 2014-08-06 MED ORDER — POLYETHYLENE GLYCOL 3350 17 G PO PACK: 17.0000 g | PACK | Freq: Every day | ORAL | Status: DC | PRN

## 2014-08-06 MED ORDER — ONDANSETRON HCL 4 MG/2ML IJ SOLN: 4.0000 mg | INTRAMUSCULAR | Status: DC | PRN

## 2014-08-06 MED ORDER — DEXAMETHASONE SODIUM PHOSPHATE 10 MG/ML IJ SOLN
INTRAMUSCULAR | Status: AC
  Filled 2014-08-06: qty 1

## 2014-08-06 MED ORDER — THROMBIN 5000 UNITS EX SOLR
OROMUCOSAL | Status: DC | PRN
  Administered 2014-08-06: 10 mL via TOPICAL

## 2014-08-06 MED ORDER — FLEET ENEMA 7-19 GM/118ML RE ENEM: 1.0000 | ENEMA | Freq: Once | RECTAL | Status: AC | PRN

## 2014-08-06 MED ORDER — NOREPINEPHRINE BITARTRATE 1 MG/ML IV SOLN: 4000.0000 ug | INTRAVENOUS | Status: DC | PRN

## 2014-08-06 MED ORDER — ROCURONIUM BROMIDE 50 MG/5ML IV SOLN
INTRAVENOUS | Status: AC
  Filled 2014-08-06: qty 2

## 2014-08-06 MED ORDER — LIDOCAINE-EPINEPHRINE 1 %-1:100000 IJ SOLN
INTRAMUSCULAR | Status: DC | PRN
  Administered 2014-08-06: 5 mL

## 2014-08-06 MED ORDER — HYDROCODONE-ACETAMINOPHEN 10-325 MG PO TABS: 2.0000 | ORAL_TABLET | Freq: Four times a day (QID) | ORAL | Status: DC | PRN

## 2014-08-06 MED ORDER — DOCUSATE SODIUM 100 MG PO CAPS
100.0000 mg | ORAL_CAPSULE | Freq: Two times a day (BID) | ORAL | Status: DC
  Administered 2014-08-06 – 2014-08-09 (×6): 100 mg via ORAL
  Filled 2014-08-06 (×6): qty 1

## 2014-08-06 MED ORDER — VITAMIN B-1 100 MG PO TABS
100.0000 mg | ORAL_TABLET | Freq: Every day | ORAL | Status: DC
  Administered 2014-08-07 – 2014-08-09 (×3): 100 mg via ORAL
  Filled 2014-08-06 (×3): qty 1

## 2014-08-06 MED ORDER — LIDOCAINE HCL (CARDIAC) 20 MG/ML IV SOLN
INTRAVENOUS | Status: AC
  Filled 2014-08-06: qty 5

## 2014-08-06 MED ORDER — IRBESARTAN 300 MG PO TABS
300.0000 mg | ORAL_TABLET | Freq: Every day | ORAL | Status: DC
  Administered 2014-08-07 – 2014-08-09 (×3): 300 mg via ORAL
  Filled 2014-08-06 (×3): qty 1

## 2014-08-06 MED ORDER — PROMETHAZINE HCL 25 MG/ML IJ SOLN: 6.2500 mg | INTRAMUSCULAR | Status: DC | PRN

## 2014-08-06 MED ORDER — LIDOCAINE HCL (CARDIAC) 20 MG/ML IV SOLN
INTRAVENOUS | Status: DC | PRN
  Administered 2014-08-06: 100 mg via INTRAVENOUS

## 2014-08-06 MED ORDER — ONDANSETRON HCL 4 MG/2ML IJ SOLN
INTRAMUSCULAR | Status: AC
  Filled 2014-08-06: qty 2

## 2014-08-06 MED ORDER — HYDROMORPHONE HCL 1 MG/ML IJ SOLN
0.2500 mg | INTRAMUSCULAR | Status: DC | PRN
  Administered 2014-08-06 (×4): 0.5 mg via INTRAVENOUS

## 2014-08-06 MED ORDER — PHENYLEPHRINE HCL 10 MG/ML IJ SOLN
INTRAMUSCULAR | Status: DC | PRN
  Administered 2014-08-06 (×2): 40 mg via INTRAVENOUS
  Administered 2014-08-06: 80 mg via INTRAVENOUS

## 2014-08-06 MED ORDER — ALUM & MAG HYDROXIDE-SIMETH 200-200-20 MG/5ML PO SUSP: 30.0000 mL | Freq: Four times a day (QID) | ORAL | Status: DC | PRN

## 2014-08-06 MED ORDER — SODIUM CHLORIDE 0.9 % IJ SOLN
3.0000 mL | Freq: Two times a day (BID) | INTRAMUSCULAR | Status: DC
  Administered 2014-08-07 – 2014-08-09 (×4): 3 mL via INTRAVENOUS

## 2014-08-06 MED ORDER — BUPIVACAINE HCL (PF) 0.5 % IJ SOLN
INTRAMUSCULAR | Status: DC | PRN
  Administered 2014-08-06: 5 mL
  Administered 2014-08-06: 20 mL

## 2014-08-06 MED ORDER — SODIUM CHLORIDE 0.9 % IV SOLN
10.0000 mg | INTRAVENOUS | Status: DC | PRN
  Administered 2014-08-06: 25 ug/min via INTRAVENOUS

## 2014-08-06 MED ORDER — ROCURONIUM BROMIDE 100 MG/10ML IV SOLN
INTRAVENOUS | Status: DC | PRN
  Administered 2014-08-06 (×2): 20 mg via INTRAVENOUS
  Administered 2014-08-06 (×2): 10 mg via INTRAVENOUS
  Administered 2014-08-06: 50 mg via INTRAVENOUS
  Administered 2014-08-06 (×2): 10 mg via INTRAVENOUS

## 2014-08-06 MED ORDER — PHENOL 1.4 % MT LIQD: 1.0000 | OROMUCOSAL | Status: DC | PRN

## 2014-08-06 MED ORDER — BISACODYL 10 MG RE SUPP: 10.0000 mg | Freq: Every day | RECTAL | Status: DC | PRN

## 2014-08-06 MED ORDER — METHOCARBAMOL 1000 MG/10ML IJ SOLN: 500.0000 mg | Freq: Four times a day (QID) | INTRAMUSCULAR | Status: DC | PRN

## 2014-08-06 MED ORDER — GLYCOPYRROLATE 0.2 MG/ML IJ SOLN
INTRAMUSCULAR | Status: AC
Start: 2014-08-06 — End: 2014-08-06
  Filled 2014-08-06: qty 4

## 2014-08-06 MED ORDER — ALBUTEROL SULFATE HFA 108 (90 BASE) MCG/ACT IN AERS: 2.0000 | INHALATION_SPRAY | Freq: Four times a day (QID) | RESPIRATORY_TRACT | Status: DC | PRN

## 2014-08-06 MED ORDER — OXYCODONE-ACETAMINOPHEN 10-325 MG PO TABS: 1.0000 | ORAL_TABLET | Freq: Three times a day (TID) | ORAL | Status: DC | PRN

## 2014-08-06 MED ORDER — CYCLOBENZAPRINE HCL 10 MG PO TABS
10.0000 mg | ORAL_TABLET | Freq: Two times a day (BID) | ORAL | Status: DC | PRN
  Administered 2014-08-06: 10 mg via ORAL
  Filled 2014-08-06: qty 1

## 2014-08-06 MED ORDER — FENTANYL CITRATE (PF) 250 MCG/5ML IJ SOLN
INTRAMUSCULAR | Status: AC
  Filled 2014-08-06: qty 5

## 2014-08-06 MED ORDER — EPHEDRINE SULFATE 50 MG/ML IJ SOLN
INTRAMUSCULAR | Status: DC | PRN
  Administered 2014-08-06 (×3): 5 mg via INTRAVENOUS
  Administered 2014-08-06: 10 mg via INTRAVENOUS
  Administered 2014-08-06: 5 mg via INTRAVENOUS

## 2014-08-06 MED ORDER — LORAZEPAM 1 MG PO TABS
1.0000 mg | ORAL_TABLET | Freq: Three times a day (TID) | ORAL | Status: DC | PRN
  Administered 2014-08-07: 1 mg via ORAL
  Filled 2014-08-06: qty 1

## 2014-08-06 MED ORDER — OXYCODONE-ACETAMINOPHEN 5-325 MG PO TABS
ORAL_TABLET | ORAL | Status: AC
  Filled 2014-08-06: qty 2

## 2014-08-06 MED ORDER — SENNA 8.6 MG PO TABS
1.0000 | ORAL_TABLET | Freq: Two times a day (BID) | ORAL | Status: DC
  Administered 2014-08-06 – 2014-08-09 (×6): 8.6 mg via ORAL
  Filled 2014-08-06 (×6): qty 1

## 2014-08-06 MED ORDER — NEOSTIGMINE METHYLSULFATE 10 MG/10ML IV SOLN
INTRAVENOUS | Status: AC
  Filled 2014-08-06: qty 1

## 2014-08-06 MED ORDER — LACTATED RINGERS IV SOLN
INTRAVENOUS | Status: DC | PRN
  Administered 2014-08-06 (×2): via INTRAVENOUS

## 2014-08-06 MED ORDER — METOPROLOL SUCCINATE ER 25 MG PO TB24
50.0000 mg | ORAL_TABLET | Freq: Every day | ORAL | Status: DC
  Administered 2014-08-07 – 2014-08-09 (×3): 50 mg via ORAL
  Filled 2014-08-06 (×3): qty 2

## 2014-08-06 MED ORDER — SODIUM CHLORIDE 0.9 % IV SOLN: INTRAVENOUS | Status: DC

## 2014-08-06 MED ORDER — GLYCOPYRROLATE 0.2 MG/ML IJ SOLN
INTRAMUSCULAR | Status: DC | PRN
  Administered 2014-08-06: .8 mg via INTRAVENOUS

## 2014-08-06 MED ORDER — SODIUM CHLORIDE 0.9 % IR SOLN
Status: DC | PRN
  Administered 2014-08-06: 500 mL

## 2014-08-06 MED ORDER — SODIUM CHLORIDE 0.9 % IV SOLN: 250.0000 mL | INTRAVENOUS | Status: DC

## 2014-08-06 MED ORDER — PROPOFOL 10 MG/ML IV BOLUS
INTRAVENOUS | Status: AC
Start: 2014-08-06 — End: 2014-08-06
  Filled 2014-08-06: qty 20

## 2014-08-06 MED ORDER — DEXAMETHASONE SODIUM PHOSPHATE 10 MG/ML IJ SOLN
INTRAMUSCULAR | Status: DC | PRN
  Administered 2014-08-06: 10 mg via INTRAVENOUS

## 2014-08-06 MED ORDER — CEFAZOLIN SODIUM-DEXTROSE 2-3 GM-% IV SOLR
2.0000 g | INTRAVENOUS | Status: AC
  Administered 2014-08-06: 2 g via INTRAVENOUS

## 2014-08-06 MED ORDER — KETOROLAC TROMETHAMINE 15 MG/ML IJ SOLN
15.0000 mg | Freq: Four times a day (QID) | INTRAMUSCULAR | Status: AC
  Administered 2014-08-06 – 2014-08-07 (×5): 15 mg via INTRAVENOUS
  Filled 2014-08-06 (×5): qty 1

## 2014-08-06 MED ORDER — ALBUTEROL SULFATE (2.5 MG/3ML) 0.083% IN NEBU: 2.5000 mg | INHALATION_SOLUTION | Freq: Four times a day (QID) | RESPIRATORY_TRACT | Status: DC | PRN

## 2014-08-06 MED ORDER — METHOCARBAMOL 500 MG PO TABS
500.0000 mg | ORAL_TABLET | Freq: Four times a day (QID) | ORAL | Status: DC | PRN
  Administered 2014-08-06 – 2014-08-09 (×5): 500 mg via ORAL
  Filled 2014-08-06 (×5): qty 1

## 2014-08-06 MED FILL — Heparin Sodium (Porcine) Inj 1000 Unit/ML: INTRAMUSCULAR | Qty: 30 | Status: AC

## 2014-08-06 MED FILL — Sodium Chloride IV Soln 0.9%: INTRAVENOUS | Qty: 1000 | Status: AC

## 2014-08-06 SURGICAL SUPPLY — 60 items
ADH SKN CLS APL DERMABOND .7 (GAUZE/BANDAGES/DRESSINGS) ×1
ADH SKN CLS LQ APL DERMABOND (GAUZE/BANDAGES/DRESSINGS) ×1
BAG DECANTER FOR FLEXI CONT (MISCELLANEOUS) ×2 IMPLANT
BLADE CLIPPER SURG (BLADE) IMPLANT
BONE MATRIX OSTEOCEL PRO MED (Bone Implant) ×1 IMPLANT
BUR MATCHSTICK NEURO 3.0 LAGG (BURR) ×2 IMPLANT
CAGE COROENT LRG 10X9X28-12 (Cage) ×2 IMPLANT
CANISTER SUCT 3000ML PPV (MISCELLANEOUS) ×2 IMPLANT
CONT SPEC 4OZ CLIKSEAL STRL BL (MISCELLANEOUS) ×4 IMPLANT
COVER BACK TABLE 60X90IN (DRAPES) ×2 IMPLANT
DECANTER SPIKE VIAL GLASS SM (MISCELLANEOUS) ×2 IMPLANT
DERMABOND ADHESIVE PROPEN (GAUZE/BANDAGES/DRESSINGS) ×1
DERMABOND ADVANCED (GAUZE/BANDAGES/DRESSINGS) ×1
DERMABOND ADVANCED .7 DNX12 (GAUZE/BANDAGES/DRESSINGS) ×1 IMPLANT
DERMABOND ADVANCED .7 DNX6 (GAUZE/BANDAGES/DRESSINGS) IMPLANT
DRAPE C-ARM 42X72 X-RAY (DRAPES) ×4 IMPLANT
DRAPE LAPAROTOMY 100X72X124 (DRAPES) ×2 IMPLANT
DRAPE POUCH INSTRU U-SHP 10X18 (DRAPES) ×2 IMPLANT
DRAPE PROXIMA HALF (DRAPES) IMPLANT
DURAPREP 26ML APPLICATOR (WOUND CARE) ×2 IMPLANT
ELECT REM PT RETURN 9FT ADLT (ELECTROSURGICAL) ×2
ELECTRODE REM PT RTRN 9FT ADLT (ELECTROSURGICAL) ×1 IMPLANT
GAUZE SPONGE 4X4 12PLY STRL (GAUZE/BANDAGES/DRESSINGS) ×2 IMPLANT
GAUZE SPONGE 4X4 16PLY XRAY LF (GAUZE/BANDAGES/DRESSINGS) IMPLANT
GLOVE BIOGEL PI IND STRL 8.5 (GLOVE) ×2 IMPLANT
GLOVE BIOGEL PI INDICATOR 8.5 (GLOVE) ×2
GLOVE ECLIPSE 8.5 STRL (GLOVE) ×4 IMPLANT
GLOVE EXAM NITRILE LRG STRL (GLOVE) IMPLANT
GLOVE EXAM NITRILE MD LF STRL (GLOVE) IMPLANT
GLOVE EXAM NITRILE XL STR (GLOVE) IMPLANT
GLOVE EXAM NITRILE XS STR PU (GLOVE) IMPLANT
GOWN STRL REUS W/ TWL LRG LVL3 (GOWN DISPOSABLE) IMPLANT
GOWN STRL REUS W/ TWL XL LVL3 (GOWN DISPOSABLE) IMPLANT
GOWN STRL REUS W/TWL 2XL LVL3 (GOWN DISPOSABLE) ×4 IMPLANT
GOWN STRL REUS W/TWL LRG LVL3 (GOWN DISPOSABLE)
GOWN STRL REUS W/TWL XL LVL3 (GOWN DISPOSABLE)
HEMOSTAT POWDER KIT SURGIFOAM (HEMOSTASIS) IMPLANT
KIT BASIN OR (CUSTOM PROCEDURE TRAY) ×2 IMPLANT
KIT ROOM TURNOVER OR (KITS) ×2 IMPLANT
NEEDLE HYPO 22GX1.5 SAFETY (NEEDLE) ×2 IMPLANT
NS IRRIG 1000ML POUR BTL (IV SOLUTION) ×2 IMPLANT
PACK LAMINECTOMY NEURO (CUSTOM PROCEDURE TRAY) ×2 IMPLANT
PAD ARMBOARD 7.5X6 YLW CONV (MISCELLANEOUS) ×6 IMPLANT
PATTIES SURGICAL .5 X1 (DISPOSABLE) ×2 IMPLANT
ROD RELINE-O LORD 5.5X40 (Rod) ×2 IMPLANT
SCREW LOCK RELINE 5.5 TULIP (Screw) ×4 IMPLANT
SCREW RELINE-O POLY 6.5X45 (Screw) ×4 IMPLANT
SPONGE LAP 4X18 X RAY DECT (DISPOSABLE) IMPLANT
SPONGE SURGIFOAM ABS GEL 100 (HEMOSTASIS) ×2 IMPLANT
SUT VIC AB 1 CT1 18XBRD ANBCTR (SUTURE) ×1 IMPLANT
SUT VIC AB 1 CT1 8-18 (SUTURE) ×2
SUT VIC AB 2-0 CP2 18 (SUTURE) ×2 IMPLANT
SUT VIC AB 3-0 SH 8-18 (SUTURE) ×2 IMPLANT
SYR 20ML ECCENTRIC (SYRINGE) ×2 IMPLANT
SYR 3ML LL SCALE MARK (SYRINGE) ×8 IMPLANT
TOWEL OR 17X24 6PK STRL BLUE (TOWEL DISPOSABLE) ×2 IMPLANT
TOWEL OR 17X26 10 PK STRL BLUE (TOWEL DISPOSABLE) ×2 IMPLANT
TRAP SPECIMEN MUCOUS 40CC (MISCELLANEOUS) ×2 IMPLANT
TRAY FOLEY W/METER SILVER 14FR (SET/KITS/TRAYS/PACK) ×2 IMPLANT
WATER STERILE IRR 1000ML POUR (IV SOLUTION) ×2 IMPLANT

## 2014-08-06 NOTE — Anesthesia Postprocedure Evaluation (Signed)
  Anesthesia Post-op Note  Patient: Jesse Hartman  Procedure(s) Performed: Procedure(s) with comments: L4-5 Posterior lumbar interbody fusion (N/A) - L4-5 Posterior lumbar interbody fusion  Patient Location: PACU  Anesthesia Type:General  Level of Consciousness: awake and alert   Airway and Oxygen Therapy: Patient Spontanous Breathing  Post-op Pain: mild  Post-op Assessment: Post-op Vital signs reviewed   LLE Sensation: Numbness   RLE Sensation: Full sensation      Post-op Vital Signs: stable  Last Vitals:  Filed Vitals:   08/06/14 1530  BP:   Pulse: 77  Temp:   Resp: 19    Complications: No apparent anesthesia complications

## 2014-08-06 NOTE — Anesthesia Procedure Notes (Signed)
Procedure Name: Intubation Date/Time: 08/06/2014 10:46 AM Performed by: Rogers Blocker Pre-anesthesia Checklist: Patient identified, Emergency Drugs available, Suction available, Patient being monitored and Timeout performed Patient Re-evaluated:Patient Re-evaluated prior to inductionOxygen Delivery Method: Circle system utilized Preoxygenation: Pre-oxygenation with 100% oxygen Intubation Type: IV induction Laryngoscope Size: Mac and 4 Grade View: Grade I Tube size: 7.5 mm Number of attempts: 1 Airway Equipment and Method: Stylet Placement Confirmation: ETT inserted through vocal cords under direct vision,  positive ETCO2 and breath sounds checked- equal and bilateral Secured at: 21 cm Tube secured with: Tape Dental Injury: Teeth and Oropharynx as per pre-operative assessment  Comments: AOI by A Holtzman SRNA

## 2014-08-06 NOTE — Progress Notes (Signed)
Pt very weak  On left side, can grip weakly and move toes on left weakly, not able to raise leg, and states cant feel left arm ,,DrElsner aware

## 2014-08-06 NOTE — H&P (Signed)
CHIEF COMPLAINT:                                          Weakness in the legs, difficulty walking, and severe back pain since 2014.    HISTORY OF PRESENT ILLNESS:                     Jesse Hartman is a 54 year old, right-handed individual whom I have seen in the past.  Back in 2006, I did a discectomy in his lumbar spine.  At that time, he had a large disc herniation at the level of L5-S1 on the left side, and he was suffering a foot drop.  He notes it took him about three months to recover, but he did good after the surgery.  Nonetheless, he started having increasing back pain and then weakness in his legs, particularly in his left lower extremity.  This has continued to progress despite efforts at conservative management, including he could afford physical therapy formally, but he is doing self-directed therapy for a number of weeks and months as best he could but has not noted any relief of pain and symptoms and despite this, he has had worsening weakness.  Ultimately, Dr. Melford Aase obtained an MRI of the lumbar spine.  The study was performed in June of this year and is reviewed today.  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  On the left side, it causes compression of the thecal sac, the L4 nerve root superiorly, and the L5 nerve root inferiorly.  There is severe central canal stenosis with the patient lying in a neutral position on the MRI table.  Plain radiographs that have been performed demonstrate that he has approximately 4 mm of slip anteriorly at the L4-5 which likely only accentuates the degree of stenosis that is present.  He also has spondylitic changes at L5-S1, some lateral recess stenosis.  He also has some degenerative changes at L3-4, L2-3, and L1-2 elsewhere in his spine, but nowhere does he have such significant compromise as he does at L4-5.  He notes  that he has been walking with the use of a cane, but he is severely limited in his stamina on his feet, and he has had the knee collapse on him a number of times when he tries to walk.  PAST MEDICAL HISTORY:                                Reveals that he has some hypertension.  He has some gastroesophageal reflux disease.  He has the chronic back and leg pain, but in addition he has COPD.  He describes that he has had several hospitalizations for shortness of breath and fluid on his lungs.  He was treated by Dr. Melford Aase for this process.    . Medications and Allergies:  On his current medication list, he is using amlodipine, cyclobenzaprine, oxycodone, Valsartan, albuterol inhaler, and some hydrocodone.    SOCIAL HISTORY:  Reveals that he smokes less than half a pack of cigarettes a day.  He drinks alcohol on a social basis.    REVIEW OF SYSTEMS:                                    Notable for wearing of glasses, hearing loss, ear pain, ringing in the ears, balance disturbance, high blood pressure, leg pain while walking, asthma, shortness of breath, history of broken left shoulder and right hand, arm weakness, leg weakness, back pain, arm pain, joint pain and swelling, arthritis, neck pain, anxiety, and depression all noted on a 14-Point Review Sheet.    PHYSICAL EXAMINATION:                                Height and weight have been stable at 240 pounds, 6 feet 2 inches in height.  On physical examination, I note that the patient walks with marked antalgia and weakness in the left lower extremity.  His weakness in the tibialis anterior group that is rated at 3/5; his weakness in the gastroc also rated at 4/5.  There is no evidence of any obvious atrophy.  There is weakness also noted in the iliopsoas and the quads on that left side, compared to the right side where he has absent reflexes on the left side completely in the patella and Achilles.  His straight leg  raising is positive at 15 degrees, particularly on the left side but also on the right side at 30 degrees.  Patrick's maneuver is negative.  Upper extremity strength and reflexes are intact.     IMPRESSION/PLAN:                                         The patient has evidence of advanced spondylolisthesis and degenerative changes at the L4-5 level.  He has marked weakness of his left lower extremity.  I do not believe that any efforts at conservative management are appropriate or indicated in this situation, as the patient does need surgical decompression and stabilization of the L4-5 joint.  I noted to the patient that he does have some evidence of epidural lipomatosis, and that ultimately a weight control program would be of benefit to him.  I am aware of his history of COPD and discussed with him the potential that there are complications from this process, and there are also complications possible from the process of his chronic smoking history and his decreased ability to heal a fusion.  Nonetheless, at this time, I believe that his options are limited, and particularly as his gait has deteriorated to such a degree he needs to have his lumbar spine decompressed and stabilized at the L4-5 level.  he is admitted for surgery

## 2014-08-06 NOTE — Transfer of Care (Signed)
Immediate Anesthesia Transfer of Care Note  Patient: Jesse Hartman  Procedure(s) Performed: Procedure(s) with comments: L4-5 Posterior lumbar interbody fusion (N/A) - L4-5 Posterior lumbar interbody fusion  Patient Location: PACU  Anesthesia Type:General  Level of Consciousness: awake, alert , oriented and patient cooperative  Airway & Oxygen Therapy: Patient Spontanous Breathing and Patient connected to nasal cannula oxygen  Post-op Assessment: Report given to RN, Post -op Vital signs reviewed and stable and Patient moving all extremities X 4  Post vital signs: Reviewed and stable  Last Vitals:  Filed Vitals:   08/06/14 0800  BP: 139/79  Pulse: 86  Temp: 36.9 C  Resp: 20    Complications: No apparent anesthesia complications

## 2014-08-06 NOTE — Op Note (Signed)
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  Asst.: Newman Pies M.D.  Indications: Patient is Jesse Hartman is a 54 y.o. male who who's had significant back pain and lumbar radiculopathy for over a years period time. A lumbar MRI demonstrates advanced spondylolisthesis with high-grade canal stenosis. he was advised regarding surgical intervention. There is also a synovial cyst on the left side.  Procedure: The patient was brought to the operating room supine on a stretcher. After the smooth induction of general endotracheal anesthesia she was turned prone and the back was prepped with alcohol and DuraPrep. The back was then draped sterilely. A midline incision was created and carried down to the lumbar dorsal fascia. A localizing radiograph identified the L4 and L5 spinous processes. A subligamentous dissection was created at L4 and L5 to expose the interlaminar space at L4 and L5 and the facet joints over the L4-L5 interspace. Laminotomies were were then created removing the entire inferior margin of the lamina of L4 including the inferior facet at the L4-L5 joint. The yellow ligament was taken up and the common dural tube was exposed along with the L4 nerve root superiorly, and the L5 nerve root inferiorly, the disc space was exposed and epidural veins in this region were cauterized and divided. The L4 nerve roots and the L5 nerve root were dissected with care taken to protect them. The disc space was opened and a combination of curettes and rongeurs was used to evacuate the disc space fully. The  endplates were removed using sharp curettes. An interbody spacer was placed to distract the disc space while the contralateral discectomy was performed. When the entirety of the disc was removed and the endplates were prepared final sizing of the disc space was obtained 12 degree 28 mm long 10 mm tall peek spacers were chosen and packed with autograft and allograft and placed into the interspace. The remainder of the interspace was packed with autograft and allograft. Pedicle entry sites were then chosen using fluoroscopic guidance and 6.5 x 45 mm screws were placed in L4 and 6.5 x 45 mm screws were placed in L5. The lateral gutters were decorticated and graft was packed in the posterolateral gutters between L4 and L5. Final radiographs were obtained after placing appropriately sized rods between the pedicle screws at L4-L5 and torquing these to the appropriate tension. The surgical site was inspected carefully to assure the L4 and L5 nerve roots were well decompressed, hemostasis was obtained, and the graft was well packed. Then the retractors were removed and the wound was closed with #1 Vicryl in the lumbar dorsal fascia 2-0 Vicryl in the subcutaneous tissue and 3-0 Vicryl subcuticularly. When he cc of half percent Marcaine was injected into the paraspinous musculature at the time of closure. Blood loss was estimated at 250 cc. The patient tolerated procedure well and was returned to the recovery room in stable condition.

## 2014-08-06 NOTE — Anesthesia Preprocedure Evaluation (Signed)
Anesthesia Evaluation  Patient identified by MRN, date of birth, ID band Patient awake    Reviewed: Allergy & Precautions, NPO status , Patient's Chart, lab work & pertinent test results  History of Anesthesia Complications Negative for: history of anesthetic complications  Airway Mallampati: II  TM Distance: >3 FB Neck ROM: Full    Dental  (+) Dental Advisory Given, Teeth Intact   Pulmonary asthma , COPDformer smoker,  breath sounds clear to auscultation        Cardiovascular hypertension, Rhythm:Regular Rate:Normal     Neuro/Psych Seizures -,   Neuromuscular disease    GI/Hepatic PUD, GERD-  ,(+)     substance abuse  alcohol use,   Endo/Other    Renal/GU      Musculoskeletal  (+) Arthritis -,   Abdominal   Peds  Hematology   Anesthesia Other Findings   Reproductive/Obstetrics                             Anesthesia Physical Anesthesia Plan  ASA: III  Anesthesia Plan: General   Post-op Pain Management:    Induction: Intravenous  Airway Management Planned: Oral ETT  Additional Equipment:   Intra-op Plan:   Post-operative Plan: Extubation in OR  Informed Consent: I have reviewed the patients History and Physical, chart, labs and discussed the procedure including the risks, benefits and alternatives for the proposed anesthesia with the patient or authorized representative who has indicated his/her understanding and acceptance.   Dental advisory given  Plan Discussed with:   Anesthesia Plan Comments:         Anesthesia Quick Evaluation

## 2014-08-06 NOTE — Progress Notes (Signed)
Received from PACU; patient is alert and oriented; transferred to the bed from a stretcher; oriented to room and unit routine.  Fall safety measures in place; placed on bed alarm.

## 2014-08-06 NOTE — Progress Notes (Signed)
Patient ID: Jesse Hartman, male   DOB: Feb 26, 1960, 54 y.o.   MRN: [id number] Vital signs are stable Motor function is intact Left-sided weakness persists in left lower extremity Left shoulder hurts secondary to positioning with numbness over the deltoid region Motor function there appears to be slightly decreased Will continue to monitor.

## 2014-08-07 MED ORDER — MANAGING BACK PAIN BOOK
Freq: Once | Status: AC
  Administered 2014-08-07: 06:00:00
  Filled 2014-08-07: qty 1

## 2014-08-07 MED ORDER — TAMSULOSIN HCL 0.4 MG PO CAPS
0.4000 mg | ORAL_CAPSULE | Freq: Every day | ORAL | Status: DC
  Administered 2014-08-07: 0.4 mg via ORAL
  Filled 2014-08-07 (×3): qty 1

## 2014-08-07 NOTE — Progress Notes (Signed)
Patient due to void at 2:00 PM. Patient states he feels no urge or sensation to void at this time. Patient helped to side of bed to attempt voiding, unable to void. Bladder scan showed 74mL, bladder not distended. Per MD, patient placed on flomax, increase PO fluids.

## 2014-08-07 NOTE — Clinical Social Work Note (Signed)
CSW consult acknowledged:  Clinical Education officer, museum received a consult for SNF placement. PT currently recommending "no PT follow up". Clinical Social Worker will sign off for now as social work intervention is no longer needed. Please consult [place] again if new need arises.  Glendon Axe, MSW, LCSWA [phone] 08/07/2014 10:04 AM

## 2014-08-07 NOTE — Progress Notes (Signed)
Patient ID: Jesse Hartman, male   DOB: 1960-01-25, 54 y.o.   MRN: [id number] Signs are stable Motor function appears intact Dressing is clean and dry Mobilizing well with PT May need a few more days of stay in house before ready for discharge Continue to monitor Currently having some urinary retention Will add Flomax

## 2014-08-07 NOTE — Evaluation (Signed)
Physical Therapy Evaluation Patient Details Name: Jesse Hartman MRN: [id number] DOB: 29-Jan-1960 Today's Date: 08/07/2014   History of Present Illness  Patient is a 54 yo male s/p L4-5 Posterior lumbar interbody fusion.  Clinical Impression  Patient demonstrates deficits in functional mobility as indicated below. Will benefit from continued skilled PT to address deficits and maximize function. Will see as indicated and progress as tolerated.     Follow Up Recommendations No PT follow up;Supervision - Intermittent    Equipment Recommendations  Cane    Recommendations for Other Services       Precautions / Restrictions Precautions Precautions: Back Precaution Booklet Issued: Yes (comment) Precaution Comments: Verbally revewied with patient Required Braces or Orthoses: Spinal Brace Spinal Brace: Lumbar corset;Applied in sitting position Restrictions Weight Bearing Restrictions: No      Mobility  Bed Mobility Overal bed mobility: Needs Assistance Bed Mobility: Rolling;Sidelying to Sit Rolling: Min guard Sidelying to sit: Min guard       General bed mobility comments: increased time to perform, verbal cues for technique and positioning. increased pain with transition  Transfers Overall transfer level: Needs assistance Equipment used: Rolling walker (2 wheeled) Transfers: Sit to/from Stand Sit to Stand: Min guard         General transfer comment: VCs for hand placement and positioning. Patient with reservation regarding WBIng through LLE but able to power up without physical assist  Ambulation/Gait Ambulation/Gait assistance: Min guard;Supervision Ambulation Distance (Feet): 140 Feet Assistive device: Rolling walker (2 wheeled) Gait Pattern/deviations: Step-through pattern;Decreased stride length;Decreased dorsiflexion - left;Decreased weight shift to left;Antalgic;Narrow base of support Gait velocity: decreased Gait velocity interpretation: Below normal speed for  age/gender General Gait Details: patient with decreased cadence, 3 standing rest breaks, very cautious with mobility and heavy reliance on RW.   Stairs            Wheelchair Mobility    Modified Rankin (Stroke Patients Only)       Balance Overall balance assessment: Needs assistance Sitting-balance support: Feet supported Sitting balance-Leahy Scale: Good     Standing balance support: During functional activity Standing balance-Leahy Scale: Fair                               Pertinent Vitals/Pain Pain Assessment: 0-10 Pain Score: 5  Pain Location: back Pain Descriptors / Indicators: Aching;Pressure;Sore;Operative site guarding Pain Intervention(s): Premedicated before session;Monitored during session;Limited activity within patient's tolerance;Repositioned    Home Living Family/patient expects to be discharged to:: Private residence Living Arrangements: Alone Available Help at Discharge: Family;Available PRN/intermittently Type of Home: Apartment Home Access: Stairs to enter Entrance Stairs-Rails: Right Entrance Stairs-Number of Steps: 3 Home Layout: One level Home Equipment: Cane - single point      Prior Function Level of Independence: Independent with assistive device(s)         Comments: uses cane for mobility seconday to LLE deficits     Hand Dominance   Dominant Hand: Right    Extremity/Trunk Assessment   Upper Extremity Assessment: Defer to OT evaluation           Lower Extremity Assessment: LLE deficits/detail   LLE Deficits / Details: limited AROM, weakness noted gross movements, dorsiflexion 2/5 all other motions 4/5     Communication   Communication: No difficulties  Cognition Arousal/Alertness: Awake/alert Behavior During Therapy: WFL for tasks assessed/performed Overall Cognitive Status: Within Functional Limits for tasks assessed  General Comments      Exercises         Assessment/Plan    PT Assessment Patient needs continued PT services  PT Diagnosis Difficulty walking;Abnormality of gait;Acute pain   PT Problem List Decreased strength;Decreased activity tolerance;Decreased balance;Decreased mobility;Decreased coordination;Decreased knowledge of use of DME  PT Treatment Interventions DME instruction;Gait training;Stair training;Functional mobility training;Therapeutic activities;Therapeutic exercise;Balance training;Patient/family education   PT Goals (Current goals can be found in the Care Plan section) Acute Rehab PT Goals Patient Stated Goal: to go home PT Goal Formulation: With patient Time For Goal Achievement: 08/21/14 Potential to Achieve Goals: Good    Frequency Min 5X/week   Barriers to discharge        Co-evaluation               End of Session Equipment Utilized During Treatment: Back brace Activity Tolerance: Patient tolerated treatment well Patient left: in chair;with call bell/phone within reach Nurse Communication: Mobility status         Time: 0762-2633 PT Time Calculation (min) (ACUTE ONLY): 25 min   Charges:   PT Evaluation $Initial PT Evaluation Tier I: 1 Procedure PT Treatments $Gait Training: 8-22 mins   PT G CodesDuncan Dull August 29, 2014, 8:53 AM Alben Deeds, PT DPT  [phone]

## 2014-08-07 NOTE — Evaluation (Signed)
Occupational Therapy Evaluation Patient Details Name: Jesse Hartman MRN: [id number] DOB: June 22, 1960 Today's Date: 08/07/2014    History of Present Illness Patient is a 54 yo male s/p L4-5 Posterior lumbar interbody fusion.   Clinical Impression   This 54 yo male admitted with above presents to acute OT with increased pain, decreased balance, decreased mobility, and back precautions all affecting his ability to care for himself at home. He will benefit from one more session of acute OT prior to D/C.    Follow Up Recommendations  No OT follow up    Equipment Recommendations   (TBD next session)       Precautions / Restrictions Precautions Precautions: Back Precaution Comments: Verbally revewied with patient--pt able to tell me all 3 precautions twice during session Required Braces or Orthoses: Spinal Brace Spinal Brace: Lumbar corset;Applied in sitting position Restrictions Weight Bearing Restrictions: No      Mobility Bed Mobility               General bed mobility comments: Pt up with nursing upon arrival  Transfers Overall transfer level: Needs assistance Equipment used: Rolling walker (2 wheeled) Transfers: Sit to/from Stand Sit to Stand: Min guard         General transfer comment: VCs for hand placement and positioning.          ADL Overall ADL's : Needs assistance/impaired Eating/Feeding: Independent;Sitting   Grooming: Set up;Sitting   Upper Body Bathing: Set up;Sitting   Lower Body Bathing: Supervison/ safety;Set up;With adaptive equipment;Sit to/from stand   Upper Body Dressing : Set up;Supervision/safety;Sitting   Lower Body Dressing: Supervision/safety;Set up;With adaptive equipment;Sit to/from stand   Toilet Transfer: Min guard;Ambulation;RW;Regular Toilet   Toileting- Clothing Manipulation and Hygiene: Set up;Supervision/safety;Sit to/from stand         General ADL Comments: Issued pt a long reacher, extra long handled sponge, extra  long shoe horn, and toliet aid     Vision Additional Comments: No change from baseline          Pertinent Vitals/Pain Pain Assessment: Faces Faces Pain Scale: Hurts even more Pain Location: back with certain movements Pain Descriptors / Indicators: Aching;Sore;Grimacing;Guarding Pain Intervention(s): Limited activity within patient's tolerance;Premedicated before session;Repositioned     Hand Dominance Right   Extremity/Trunk Assessment Upper Extremity Assessment Upper Extremity Assessment: Overall WFL for tasks assessed           Communication Communication Communication: No difficulties   Cognition Arousal/Alertness: Awake/alert Behavior During Therapy: WFL for tasks assessed/performed Overall Cognitive Status: Within Functional Limits for tasks assessed                                Home Living Family/patient expects to be discharged to:: Private residence Living Arrangements: Alone Available Help at Discharge: Family;Available 24 hours/day (initially per pt) Type of Home: Apartment Home Access: Stairs to enter Entrance Stairs-Number of Steps: 3 Entrance Stairs-Rails: Right Home Layout: One level     Bathroom Shower/Tub: Tub/shower unit;Curtain   Biochemist, clinical: Standard     Home Equipment: Cane - single point;Walker - 2 wheels          Prior Functioning/Environment Level of Independence: Independent with assistive device(s)        Comments: uses cane for mobility seconday to LLE deficits    OT Diagnosis: Generalized weakness;Acute pain   OT Problem List: Decreased strength;Decreased range of motion;Impaired balance (sitting and/or standing);Pain;Decreased knowledge of precautions;Decreased knowledge of  use of DME or AE   OT Treatment/Interventions: Self-care/ADL training;Patient/family education;DME and/or AE instruction    OT Goals(Current goals can be found in the care plan section) Acute Rehab OT Goals Patient Stated Goal: to  go home OT Goal Formulation: With patient Time For Goal Achievement: 08/14/14 Potential to Achieve Goals: Good  OT Frequency: Min 2X/week              End of Session Equipment Utilized During Treatment: Rolling walker;Back brace Nurse Communication:  (Nursing already aware of mobility status)  Activity Tolerance: Patient tolerated treatment well Patient left: in chair;with call bell/phone within reach;with chair alarm set   Time: 1105-1150 OT Time Calculation (min): 45 min Charges:  OT General Charges $OT Visit: 1 Procedure OT Evaluation $Initial OT Evaluation Tier I: 1 Procedure OT Treatments $Self Care/Home Management : 23-37 mins  Almon Register [phone] 08/07/2014, 12:29 PM

## 2014-08-07 NOTE — Progress Notes (Signed)
Utilization review completed. Demarlo Riojas, RN, BSN. 

## 2014-08-07 NOTE — Care Management Note (Signed)
Case Management Note  Patient Details  Name: Jesse Hartman MRN: [id number] Date of Birth: 07/20/1960  Subjective/Objective:                    Action/Plan: Spoke with bedside RN, who states that patient has multiple questions regarding his Medicaid.  CM met with patient, who was wanting to know what his Medicaid would pay for at discharge.  CM explained that PT did not feel he needed any therapy at discharge and his only DME need was a cane.  Patient states that he has a walker, but is interested in a cane at discharge.  CM will continue to follow regarding recommendations based on progress, as patient is currently POD#1.  Expected Discharge Date:                  Expected Discharge Plan:  Home/Self Care  In-House Referral:     Discharge planning Services  CM Consult  Post Acute Care Choice:    Choice offered to:     DME Arranged:    DME Agency:     HH Arranged:    HH Agency:     Status of Service:  In process, will continue to follow  Medicare Important Message Given:    Date Medicare IM Given:    Medicare IM give by:    Date Additional Medicare IM Given:    Additional Medicare Important Message give by:     If discussed at Long Length of Stay Meetings, dates discussed:    Additional Comments:  ,  C, RN 08/07/2014, 2:11 PM  

## 2014-08-08 NOTE — Progress Notes (Signed)
Patient ID: Jesse Hartman, male   DOB: May 30, 1960, 54 y.o.   MRN: [id number] Vital signs are stable Dressing was changed yesterday Motor function appears intact Will mobilize today Voiding quite well Plan discharge for tomorrow

## 2014-08-08 NOTE — Progress Notes (Signed)
Occupational Therapy Treatment Patient Details Name: Jesse Hartman MRN: [id number] DOB: [birth year] Today's Date: 08/08/2014    History of present illness Patient is a 54 yo male s/p L4-5 Posterior lumbar interbody fusion.   OT comments  Pt progressing towards acute OT goals. Focus of session was tub shower transfer as detailed below. Also reviewed AE and compensatory strategies for ADLs with back precautions. D/c plan remains appropriate.  Follow Up Recommendations  No OT follow up    Equipment Recommendations  Tub/shower bench    Recommendations for Other Services      Precautions / Restrictions Precautions Precautions: Back Precaution Comments: pt able to state 2/3 precautions (missed bending) Required Braces or Orthoses: Spinal Brace Spinal Brace: Lumbar corset;Applied in sitting position Restrictions Weight Bearing Restrictions: No       Mobility Bed Mobility Overal bed mobility: Needs Assistance Bed Mobility: Rolling;Sidelying to Sit Rolling: Min guard Sidelying to sit: Min guard       General bed mobility comments: min guard for safety  Transfers Overall transfer level: Needs assistance Equipment used: Rolling walker (2 wheeled) Transfers: Sit to/from Stand Sit to Stand: Min guard         General transfer comment: min guard for safety, cues for technique    Balance Overall balance assessment: Needs assistance Sitting-balance support: No upper extremity supported;Feet supported Sitting balance-Leahy Scale: Good     Standing balance support: Bilateral upper extremity supported;During functional activity Standing balance-Leahy Scale: Fair                     ADL Overall ADL's : Needs assistance/impaired                                 Tub/ Shower Transfer: Tub transfer;Ambulation;3 in 1;Rolling walker;Tub bench Tub/Shower Transfer Details (indicate cue type and reason): Pt attempted transfer to 3n1 in tub shower but unable to  safely step over side of tub. Recommend tub transfer bench.  Functional mobility during ADLs: Min guard;Rolling walker General ADL Comments: Pt practiced tub shower transfer using simulated setup in room as detailed above. Reviewed AE and compensatory strategies for LB ADLs and transfer. Pt noted to be slightly impulsive.       Vision                     Perception     Praxis      Cognition   Behavior During Therapy: [facility] for tasks assessed/performed;Impulsive Overall Cognitive Status: Within Functional Limits for tasks assessed       Memory: Decreased recall of precautions               Extremity/Trunk Assessment               Exercises     Shoulder Instructions       General Comments      Pertinent Vitals/ Pain       Pain Assessment: 0-10 Pain Score: 8  Pain Location: back, upper thighs Pain Descriptors / Indicators: Aching;Sore Pain Intervention(s): Limited activity within patient's tolerance;Monitored during session;Repositioned;Patient requesting pain meds-RN notified  Home Living                                          Prior Functioning/Environment  Frequency Min 2X/week     Progress Toward Goals  OT Goals(current goals can now be found in the care plan section)  Progress towards OT goals: Progressing toward goals  Acute Rehab OT Goals Patient Stated Goal: to go home OT Goal Formulation: With patient Time For Goal Achievement: 08/14/14 Potential to Achieve Goals: Good  Plan Discharge plan remains appropriate    Co-evaluation                 End of Session Equipment Utilized During Treatment: Rolling walker;Back brace   Activity Tolerance Patient tolerated treatment well   Patient Left in chair;with call bell/phone within reach;with chair alarm set   Nurse Communication Patient requests pain meds        Time: 1418-1440 OT Time Calculation (min): 22 min  Charges: OT General  Charges $OT Visit: 1 Procedure OT Treatments $Self Care/Home Management : 8-22 mins  Hortencia Pilar 08/08/2014, 2:50 PM

## 2014-08-08 NOTE — Progress Notes (Signed)
Physical Therapy Treatment Patient Details Name: Jesse Hartman MRN: [id number] DOB: 1960-02-24 Today's Date: 08/08/2014    History of Present Illness Patient is a 54 yo male s/p L4-5 Posterior lumbar interbody fusion.    PT Comments    Good progress towards physical therapy goals. Able to safely perform stair training. Ambulating with RW, no notable loss of balance or buckling. Pt does report pain radiating to buttocks and posterior thighs bilaterally, no longer in anterior thigh region. Slightly impulsive and needs assist recalling and maintaining back precautions. Patient will continue to benefit from skilled physical therapy services to further improve independence with functional mobility. Will likely be ready for d/c from a mobility standpoint with one more therapy session tomorrow.   Follow Up Recommendations  No PT follow up;Supervision - Intermittent     Equipment Recommendations   (States he has a cane)    Recommendations for Other Services       Precautions / Restrictions Precautions Precautions: Back Precaution Comments: pt able to state 2/3 precautions. Reviewed handout Required Braces or Orthoses: Spinal Brace Spinal Brace: Lumbar corset;Applied in sitting position Restrictions Weight Bearing Restrictions: No    Mobility  Bed Mobility Overal bed mobility: Needs Assistance Bed Mobility: Rolling;Sidelying to Sit;Sit to Sidelying Rolling: Supervision Sidelying to sit: Supervision     Sit to sidelying: Supervision General bed mobility comments: Supervision for safety. VC for technique. Relied on rail.   Transfers Overall transfer level: Needs assistance Equipment used: Rolling walker (2 wheeled) Transfers: Sit to/from Stand Sit to Stand: Min guard         General transfer comment: VC for hand placement and to maintain back precautions by preventing flexion.  Ambulation/Gait Ambulation/Gait assistance: Supervision Ambulation Distance (Feet): 65  Feet Assistive device: Rolling walker (2 wheeled) Gait Pattern/deviations: Step-through pattern;Decreased stride length Gait velocity: decreased Gait velocity interpretation: Below normal speed for age/gender General Gait Details: VC for upright posture and walker placement with turns to prevent twisting. Moderate reliance on RW and declines to use cane at this time. Declines further distance as he states he ambulated further distance earlier today with staff.   Stairs Stairs: Yes Stairs assistance: Min guard Stair Management: One rail Right;Step to pattern;Forwards;With cane Number of Stairs: 2 (x3) General stair comments: Educated on safe stair navigation techniques with use of a cane similar to how pt has been performing at home. VC for sequencing and cane placement prior to stepping. States   Engineer, building services Rankin (Stroke Patients Only)       Balance                                    Cognition Arousal/Alertness: Awake/alert Behavior During Therapy: Impulsive Overall Cognitive Status: Within Functional Limits for tasks assessed       Memory: Decreased recall of precautions              Exercises Other Exercises Other Exercises: Neural glide stretch with LT knee extension and dorsiflexion of ankle in sitting. Tolerates short ROM with this task. Perform x10 twice per day within pain free range.    General Comments General comments (skin integrity, edema, etc.): pt impulsive at times. Cues to take his time and maintain back precautions to prevent injury.       Pertinent Vitals/Pain Pain Assessment: Faces Faces Pain Scale: Hurts little more Pain Location: hamstrings and buttocks Pain Descriptors / Indicators: Radiating  Pain Intervention(s): Monitored during session;Repositioned    Home Living                      Prior Function            PT Goals (current goals can now be found in the care plan section) Acute Rehab  PT Goals Patient Stated Goal: to go home PT Goal Formulation: With patient Time For Goal Achievement: 08/21/14 Potential to Achieve Goals: Good Progress towards PT goals: Progressing toward goals    Frequency  Min 5X/week    PT Plan Current plan remains appropriate;Equipment recommendations need to be updated    Co-evaluation             End of Session Equipment Utilized During Treatment: Back brace Activity Tolerance: Patient tolerated treatment well Patient left: in bed;with call bell/phone within reach;with bed alarm set     Time: 1709-1730 PT Time Calculation (min) (ACUTE ONLY): 21 min  Charges:  $Gait Training: 8-22 mins                    G Codes:      Ellouise Newer 2014-09-04, 6:00 PM Camille Bal [place], [place]

## 2014-08-09 MED ORDER — OXYCODONE-ACETAMINOPHEN 10-325 MG PO TABS: 1.0000 | ORAL_TABLET | ORAL | Status: DC | PRN

## 2014-08-09 MED ORDER — METHOCARBAMOL 500 MG PO TABS: 500.0000 mg | ORAL_TABLET | Freq: Four times a day (QID) | ORAL | Status: DC | PRN

## 2014-08-09 NOTE — Progress Notes (Signed)
Patient is discharged from room 4N01 at this time. Alert and in stable condition. IV site d/c'd. Instructions read to patient and understanding verbalized. Left unit via wheelchair with all belongings and daughter at side.

## 2014-08-09 NOTE — Progress Notes (Addendum)
Physical Therapy Treatment Patient Details Name: Jesse Hartman MRN: [id number] DOB: 10-23-1960 Today's Date: 08/09/2014    History of Present Illness Patient is a 54 yo male s/p L4-5 Posterior lumbar interbody fusion.    PT Comments    Patient seen for mobility progression and education for potential discharge home. Patient mobilizing with heavy reliance on RW. Patient continues to report pain in bilateral posterior thighs (hamstrings and buttocks) with mobility. Patient educated on technique for car transfers. At this time, highly recommend use of RW for mobility despite patient's desire for cane as patient unable to progress to use of cane given current pain levels and increased reliance on bilateral UE support.  Will continue to see and progress as tolerated.  Follow Up Recommendations  No PT follow up;Supervision - Intermittent     Equipment Recommendations  Rolling walker with 5" wheels    Recommendations for Other Services       Precautions / Restrictions Precautions Precautions: Back Precaution Comments: pt able to state 2/3 precautions. Reviewed handout Required Braces or Orthoses: Spinal Brace Spinal Brace: Lumbar corset;Applied in sitting position Restrictions Weight Bearing Restrictions: No    Mobility  Bed Mobility Overal bed mobility: Modified Independent Bed Mobility: Rolling;Sidelying to Sit Rolling: Modified independent (Device/Increase time) Sidelying to sit: Modified independent (Device/Increase time)          Transfers Overall transfer level: Needs assistance Equipment used: Rolling walker (2 wheeled) Transfers: Sit to/from Stand Sit to Stand: Min guard         General transfer comment: VC for hand placement and to maintain back precautions by preventing flexion.  Ambulation/Gait Ambulation/Gait assistance: Supervision Ambulation Distance (Feet): 180 Feet (one seated rest break due to cramping in legs) Assistive device: Rolling walker (2  wheeled) Gait Pattern/deviations: Step-through pattern;Decreased stride length Gait velocity: decreased   General Gait Details: VCS for upright posture and relaxation of shoulders/UEs during mobility. One seated rest break due to cramping in bilateral posterior thighs   Stairs            Wheelchair Mobility    Modified Rankin (Stroke Patients Only)       Balance     Sitting balance-Leahy Scale: Good     Standing balance support: Bilateral upper extremity supported Standing balance-Leahy Scale: Fair                      Cognition Arousal/Alertness: Awake/alert Behavior During Therapy: Impulsive Overall Cognitive Status: Within Functional Limits for tasks assessed       Memory: Decreased recall of precautions              Exercises      General Comments General comments (skin integrity, edema, etc.): patient continues to endorse pain in hamstings (cramping) with mobility. Patient educated regarding relaxation and positioning. Patient educated by PT and MD regarding positioning and proper use of back brace.       Pertinent Vitals/Pain Pain Assessment: Faces Faces Pain Scale: Hurts little more Pain Location: low back, buttocks and hamstrings Pain Descriptors / Indicators: Aching;Dull;Grimacing;Radiating;Cramping Pain Intervention(s): Limited activity within patient's tolerance;Monitored during session;Repositioned;Premedicated before session    Home Living                      Prior Function            PT Goals (current goals can now be found in the care plan section) Acute Rehab PT Goals Patient Stated Goal: to go home  PT Goal Formulation: With patient Time For Goal Achievement: 08/21/14 Potential to Achieve Goals: Good Progress towards PT goals: Progressing toward goals    Frequency  Min 5X/week    PT Plan Current plan remains appropriate;Equipment recommendations need to be updated    Co-evaluation             End  of Session Equipment Utilized During Treatment: Back brace Activity Tolerance: Patient tolerated treatment well Patient left: in chair;with call bell/phone within reach;with chair alarm set     Time: 0842-0900 PT Time Calculation (min) (ACUTE ONLY): 18 min  Charges:  $Gait Training: 8-22 mins                    G CodesDuncan Dull 09-06-14, 9:48 AM Alben Deeds, PT DPT  [phone]

## 2014-08-09 NOTE — Discharge Summary (Signed)
Physician Discharge Summary  Patient ID: Jesse Hartman MRN: [id number] DOB/AGE: 1961-01-04 54 y.o.  Admit date: 08/06/2014 Discharge date: 08/09/2014  Admission Diagnoses: Spondylolisthesis L4-L5 with lumbar radiculopathy  Discharge Diagnoses: Spondylolisthesis L4-L5 with lumbar radiculopathy. urinary retention Active Problems:   Spondylolisthesis of lumbar region   Discharged Condition: good  Hospital Course: Patient was admitted to undergo surgical decompression and arthrodesis L4-L5. He's had previous decompression on the left side secondary to herniated nucleus pulposus with a resultant foot drop. His symptoms have been getting worse in his foot drop is actually progressed. Postoperatively his had good relief of his preoperative pain and his motor function is improving.  Consults: None  Significant Diagnostic Studies: None  Treatments: surgery: Decompression of L4 and L5 nerve roots with posterior lumbar interbody arthrodesis using peek spacers local autograft and allograft. Pedicle screw fixation L4-L5.  Discharge Exam: Blood pressure 116/60, pulse 66, temperature 99 F (37.2 C), temperature source Oral, resp. rate 18, height 6\' 2"  (1.88 m), weight 109 kg (240 lb 4.8 oz), SpO2 99 %. His incision is clean and dry he has significant very incisional ecchymosis. His motor function reveals weakness in the tibialis anterior group to 4 minus out of 5 on the left compared to normal strength on the right. His other motor function appears to be intact.  Disposition: 01-Home or Self Care  Discharge Instructions    Call MD for:  redness, tenderness, or signs of infection (pain, swelling, redness, odor or green/yellow discharge around incision site)    Complete by:  As directed      Call MD for:  severe uncontrolled pain    Complete by:  As directed      Call MD for:  temperature >100.4    Complete by:  As directed      Diet - low sodium heart healthy    Complete by:  As directed      Discharge instructions    Complete by:  As directed   Okay to shower. Do not apply salves or appointments to incision. No heavy lifting with the upper extremities greater than 15 pounds. May resume driving when not requiring pain medication and patient feels comfortable with doing so.     Increase activity slowly    Complete by:  As directed             Medication List    TAKE these medications        albuterol 108 (90 BASE) MCG/ACT inhaler  Commonly known as:  PROVENTIL HFA;VENTOLIN HFA  Inhale 2 puffs into the lungs every 6 (six) hours as needed for wheezing or shortness of breath (copd).     albuterol (2.5 MG/3ML) 0.083% nebulizer solution  Commonly known as:  PROVENTIL  Take 2.5 mg by nebulization every 6 (six) hours as needed for wheezing or shortness of breath.     amLODipine 10 MG tablet  Commonly known as:  NORVASC  Take 10 mg by mouth daily.     budesonide-formoterol 160-4.5 MCG/ACT inhaler  Commonly known as:  SYMBICORT  Inhale 2 puffs into the lungs daily.     cyclobenzaprine 10 MG tablet  Commonly known as:  FLEXERIL  Take 1 tablet (10 mg total) by mouth 2 (two) times daily as needed for muscle spasms.     Fish Oil 1000 MG Caps  Take 1,000 mg by mouth daily.     folic acid 1 MG tablet  Commonly known as:  FOLVITE  Take 1 tablet (1 mg  total) by mouth daily.     HYDROcodone-acetaminophen 10-325 MG per tablet  Commonly known as:  NORCO  Take 2 tablets by mouth every 6 (six) hours as needed for moderate pain.     hydrOXYzine 25 MG tablet  Commonly known as:  ATARAX/VISTARIL  Take 25 mg by mouth every 6 (six) hours as needed for anxiety or itching (using mainly for 'anxiety'........states he's taken 1-2 and it doesn't really help).     LORazepam 1 MG tablet  Commonly known as:  ATIVAN  Take 1 tablet (1 mg total) by mouth every 8 (eight) hours as needed for anxiety.     methocarbamol 500 MG tablet  Commonly known as:  ROBAXIN  Take 1 tablet (500 mg total)  by mouth every 6 (six) hours as needed for muscle spasms.     metoprolol succinate 50 MG 24 hr tablet  Commonly known as:  TOPROL-XL  Take 50 mg by mouth daily. Take with or immediately following a meal.     Naproxen Sodium 220 MG Caps  Take 1 capsule by mouth daily.     oxyCODONE-acetaminophen 10-325 MG per tablet  Commonly known as:  PERCOCET  Take 1 tablet by mouth every 8 (eight) hours as needed. for pain     oxyCODONE-acetaminophen 10-325 MG per tablet  Commonly known as:  PERCOCET  Take 1 tablet by mouth every 4 (four) hours as needed for pain.     pantoprazole 40 MG tablet  Commonly known as:  PROTONIX  Take 1 tablet (40 mg total) by mouth 2 (two) times daily.     SPIRIVA RESPIMAT 2.5 MCG/ACT Aers  Generic drug:  Tiotropium Bromide Monohydrate  Inhale 1 capsule into the lungs 2 (two) times daily.     thiamine 100 MG tablet  Take 1 tablet (100 mg total) by mouth daily.     valsartan 320 MG tablet  Commonly known as:  DIOVAN  Take 320 mg by mouth daily.         SignedEarleen Newport 08/09/2014, 8:50 AM

## 2014-08-09 NOTE — Progress Notes (Signed)
Occupational Therapy Treatment Patient Details Name: Jesse Hartman MRN: [id number] DOB: 1960-08-30 Today's Date: 08/09/2014    History of present illness Patient is a 54 yo male s/p L4-5 Posterior lumbar interbody fusion.   OT comments  Pt progressing towards acute OT goals. ADLs completed as detailed below. Encouraged pt to slow down and take his time with tasks as he has a tendency to rush and risk not adhering to BAT precautions. Cueing need at times for BAT precautions. D/c plan remains appropriate.   Follow Up Recommendations  No OT follow up    Equipment Recommendations  Tub/shower bench    Recommendations for Other Services      Precautions / Restrictions Precautions Precautions: Back Precaution Comments: reviewed Required Braces or Orthoses: Spinal Brace Spinal Brace: Lumbar corset;Applied in sitting position Restrictions Weight Bearing Restrictions: No       Mobility Bed Mobility Overal bed mobility: Modified Independent Bed Mobility: Rolling;Sidelying to Sit Rolling: Modified independent (Device/Increase time) Sidelying to sit: Modified independent (Device/Increase time)          Transfers Overall transfer level: Needs assistance Equipment used: Rolling walker (2 wheeled) Transfers: Sit to/from Stand Sit to Stand: Min guard         General transfer comment: cues for technique    Balance           Standing balance support: Bilateral upper extremity supported;During functional activity Standing balance-Leahy Scale: Fair                     ADL Overall ADL's : Needs assistance/impaired     Grooming: Supervision/safety;Oral care;Standing;Cueing for compensatory techniques           Upper Body Dressing : Set up;Sitting;Supervision/safety Upper Body Dressing Details (indicate cue type and reason): supervision for brace management Lower Body Dressing: Supervision/safety;With adaptive equipment;Sit to/from stand                Functional mobility during ADLs: Min guard;Rolling walker General ADL Comments: Pt completed ADLs as detailed below. Encouraged pt to slow down and take his time with tasks as he has a tendency to rush and risk not adhering to BAT precautions. Cueing need at times for BAt precautions.      Vision                     Perception     Praxis      Cognition   Behavior During Therapy: Impulsive Overall Cognitive Status: Within Functional Limits for tasks assessed       Memory: Decreased recall of precautions               Extremity/Trunk Assessment               Exercises     Shoulder Instructions       General Comments      Pertinent Vitals/ Pain       Pain Assessment: 0-10 Pain Score: 8  Pain Location: surgical site at back, buttocks, hamstrings Pain Descriptors / Indicators: Aching;Grimacing;Guarding;Sore;Cramping Pain Intervention(s): Limited activity within patient's tolerance;Monitored during session;Repositioned  Home Living                                          Prior Functioning/Environment              Frequency Min 2X/week  Progress Toward Goals  OT Goals(current goals can now be found in the care plan section)  Progress towards OT goals: Progressing toward goals  Acute Rehab OT Goals Patient Stated Goal: to go home OT Goal Formulation: With patient Time For Goal Achievement: 08/14/14 Potential to Achieve Goals: Good  Plan Discharge plan remains appropriate    Co-evaluation                 End of Session Equipment Utilized During Treatment: Rolling walker;Back brace   Activity Tolerance Patient tolerated treatment well   Patient Left in chair;with call bell/phone within reach;with chair alarm set   Nurse Communication          Time: 9826-4158 OT Time Calculation (min): 23 min  Charges: OT General Charges $OT Visit: 1 Procedure OT Treatments $Self Care/Home Management : 23-37  mins  Hortencia Pilar 08/09/2014, 1:50 PM

## 2014-08-09 NOTE — Care Management Note (Signed)
Case Management Note  Patient Details  Name: Jesse Hartman MRN: [id number] Date of Birth: 08-04-1960  Subjective/Objective:                    Action/Plan: Patient will be discharging home today. Orders for rolling walker and tub bench.  Per PT, patient states that he would like to be discharged with a cane instead of a walker.  PT does not feel that patient would be safe ambulating with a can at this time. This information was discussed with patient.  No cane will be ordered. [facility] was notified of DME needs for discharge home today.  Expected Discharge Date:                  Expected Discharge Plan:  Home/Self Care  In-House Referral:     Discharge planning Services  CM Consult  Post Acute Care Choice:  Durable Medical Equipment Choice offered to:  Patient  DME Arranged:  Tub bench, Walker rolling DME Agency:  [place]:    [place] Agency:     Status of Service:  In process, will continue to follow  Medicare Important Message Given:    Date Medicare IM Given:    Medicare IM give by:    Date Additional Medicare IM Given:    Additional Medicare Important Message give by:     If discussed at [place] of Stay Meetings, dates discussed:    Additional Comments:  Rolm Baptise, RN 08/09/2014, 11:47 AM

## 2014-08-23 ENCOUNTER — Emergency Department (HOSPITAL_COMMUNITY)
Admission: EM | Admit: 2014-08-23 | Discharge: 2014-08-23 | Disposition: A | Discharge status: Home / Self Care | Payer: Medicaid Other | Attending: Emergency Medicine | Admitting: Emergency Medicine

## 2014-08-23 ENCOUNTER — Encounter (HOSPITAL_COMMUNITY): Payer: Self-pay | Admitting: Family Medicine

## 2014-08-23 ENCOUNTER — Emergency Department (HOSPITAL_COMMUNITY): Payer: Medicaid Other

## 2014-08-23 DIAGNOSIS — M25473 Effusion, unspecified ankle: Secondary | ICD-10-CM

## 2014-08-23 DIAGNOSIS — Z86718 Personal history of other venous thrombosis and embolism: Secondary | ICD-10-CM | POA: Diagnosis not present

## 2014-08-23 DIAGNOSIS — Z8669 Personal history of other diseases of the nervous system and sense organs: Secondary | ICD-10-CM | POA: Insufficient documentation

## 2014-08-23 DIAGNOSIS — F419 Anxiety disorder, unspecified: Secondary | ICD-10-CM | POA: Diagnosis not present

## 2014-08-23 DIAGNOSIS — R2243 Localized swelling, mass and lump, lower limb, bilateral: Secondary | ICD-10-CM | POA: Diagnosis not present

## 2014-08-23 DIAGNOSIS — K219 Gastro-esophageal reflux disease without esophagitis: Secondary | ICD-10-CM | POA: Insufficient documentation

## 2014-08-23 DIAGNOSIS — J449 Chronic obstructive pulmonary disease, unspecified: Secondary | ICD-10-CM | POA: Insufficient documentation

## 2014-08-23 DIAGNOSIS — Z79899 Other long term (current) drug therapy: Secondary | ICD-10-CM | POA: Diagnosis not present

## 2014-08-23 DIAGNOSIS — M1990 Unspecified osteoarthritis, unspecified site: Secondary | ICD-10-CM | POA: Diagnosis not present

## 2014-08-23 DIAGNOSIS — Z8639 Personal history of other endocrine, nutritional and metabolic disease: Secondary | ICD-10-CM | POA: Insufficient documentation

## 2014-08-23 DIAGNOSIS — Z87891 Personal history of nicotine dependence: Secondary | ICD-10-CM | POA: Insufficient documentation

## 2014-08-23 DIAGNOSIS — M7989 Other specified soft tissue disorders: Secondary | ICD-10-CM | POA: Diagnosis present

## 2014-08-23 DIAGNOSIS — I10 Essential (primary) hypertension: Secondary | ICD-10-CM | POA: Diagnosis not present

## 2014-08-23 LAB — BASIC METABOLIC PANEL
Anion gap: 9 (ref 5–15)
BUN: 15 mg/dL (ref 6–20)
CALCIUM: 9.4 mg/dL (ref 8.9–10.3)
CO2: 23 mmol/L (ref 22–32)
CREATININE: 1.11 mg/dL (ref 0.61–1.24)
Chloride: 102 mmol/L (ref 101–111)
GFR calc non Af Amer: >60 mL/min (ref >60)
Glucose, Bld: 114 mg/dL — ABNORMAL HIGH (ref 65–99)
Potassium: 5 mmol/L (ref 3.5–5.1)
SODIUM: 134 mmol/L — AB (ref 135–145)

## 2014-08-23 LAB — CBC WITH DIFFERENTIAL/PLATELET
BASOS PCT: 1 % (ref 0–1)
Basophils Absolute: 0.1 10*3/uL (ref 0.0–0.1)
EOS ABS: 0.3 10*3/uL (ref 0.0–0.7)
EOS PCT: 4 % (ref 0–5)
HCT: 38.5 % — ABNORMAL LOW (ref 39.0–52.0)
Hemoglobin: 13 g/dL (ref 13.0–17.0)
LYMPHS ABS: 2.2 10*3/uL (ref 0.7–4.0)
Lymphocytes Relative: 27 % (ref 12–46)
MCH: 30.7 pg (ref 26.0–34.0)
MCHC: 33.8 g/dL (ref 30.0–36.0)
MCV: 90.8 fL (ref 78.0–100.0)
MONO ABS: 0.7 10*3/uL (ref 0.1–1.0)
MONOS PCT: 8 % (ref 3–12)
Neutro Abs: 4.9 10*3/uL (ref 1.7–7.7)
Neutrophils Relative %: 60 % (ref 43–77)
PLATELETS: 277 10*3/uL (ref 150–400)
RBC: 4.24 MIL/uL (ref 4.22–5.81)
RDW: 15.1 % (ref 11.5–15.5)
WBC: 8.1 10*3/uL (ref 4.0–10.5)

## 2014-08-23 LAB — BRAIN NATRIURETIC PEPTIDE: B NATRIURETIC PEPTIDE 5: 31.1 pg/mL (ref 0.0–100.0)

## 2014-08-23 NOTE — ED Notes (Signed)
Pt here for numbness and swelling to bilateral feet. sts started yesterday.

## 2014-08-23 NOTE — ED Provider Notes (Signed)
CSN: [id number]     Arrival date & time 08/23/14  1710 History   First MD Initiated Contact with Patient 08/23/14 1942     Chief Complaint  Patient presents with  . Foot Swelling     (Consider location/radiation/quality/duration/timing/severity/associated sxs/prior Treatment) HPI Jesse Hartman is a 54 y.o. male history of hypertension, COPD, Barrett's esophagus, recent lumbar spine surgery, comes in for evaluation of bilateral ankle swelling. Patient states he has been recovering well from his spinal surgery, has been walking more every day. He reports more walking than usual yesterday and woke up this morning with bilateral ankle swelling. He reports calling his surgeon who told him to come to the ED for further evaluation. Patient denies any chest pain, difficulty breathing, shortness of breath, leg pain, back pain, fevers, hemoptysis. States that he feels well overall and is "99% sure nothing is wrong". No other aggravating or modifying factors.  Past Medical History  Diagnosis Date  . Hypertension   . Barrett's syndrome   . Arthritis   . Hypokalemia   . Hyponatremia   . Esophagitis   . GERD (gastroesophageal reflux disease)   . DVT (deep venous thrombosis)   . COPD (chronic obstructive pulmonary disease)   . Depression   . Substance abuse   . Hiatal Hernia 06/22/2013  . 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 Hypercalcemia   . Anxiety   . Asthma   . Blood transfusion without reported diagnosis   . Sleep apnea     YRS AGO---CAME OUT POSITIVE, BUT DOESN'T WEAR THE MASK  . Seizures     STATES HE THINKS THIS IS WRONG.   Past Surgical History  Procedure Laterality Date  . Rhinoplasty    . Bunionectomy    . Appendectomy    . Esophagogastroduodenoscopy  08/03/2011    Procedure: ESOPHAGOGASTRODUODENOSCOPY (EGD);  Surgeon: Milus Banister, MD;  Location: [facility];  Service: Endoscopy;  Laterality: N/A;  . Finger surgery      lt middle  . Esophagogastroduodenoscopy N/A 01/24/2013     Procedure: ESOPHAGOGASTRODUODENOSCOPY (EGD);  Surgeon: Lafayette Dragon, MD;  Location[facility] END[facility];  Service: Endoscopy;  Laterality: N/A;  . Nasal sinus surgery    . Back surgery      L1 L2  . Esophagogastroduodenoscopy N/A 06/22/2013    Procedure: ESOPHAGOGASTRODUODENOSCOPY (EGD);  Surgeon: Gatha Mayer, MD;  Location: [facility];  Service: Endoscopy;  Laterality: N/A;  . Perforated eardrum      RIGHT EAR - 2013   Family History  Problem Relation Age of Onset  . Diabetes Maternal Grandfather   . Heart disease Father    Social History  Substance Use Topics  . Smoking status: Former Smoker -- 0.25 packs/day for 30 years    Types: Cigarettes    Quit date: 07/24/2014  . Smokeless tobacco: Never Used  . Alcohol Use: 16.8 oz/week    28 Cans of beer per week     Comment: ALCOHOL FREE FOR 7 MTHS   .Marland KitchenMarland KitchenWENT TO [facility] 02/2013 , HAD RELAPSE AND TRIED AGAIN.    Review of Systems A 10 point review of systems was completed and was negative except for pertinent positives and negatives as mentioned in the history of present illness     Allergies  Food  Home Medications   Prior to Admission medications   Medication Sig Start Date End Date Taking? Authorizing Provider  albuterol (PROVENTIL HFA;VENTOLIN HFA) 108 (90 BASE) MCG/ACT inhaler Inhale 2 puffs into the lungs every  6 (six) hours as needed for wheezing or shortness of breath (copd).     Historical Provider, MD  albuterol (PROVENTIL) (2.5 MG/3ML) 0.083% nebulizer solution Take 2.5 mg by nebulization every 6 (six) hours as needed for wheezing or shortness of breath.    Historical Provider, MD  amLODipine (NORVASC) 10 MG tablet Take 10 mg by mouth daily.    Historical Provider, MD  budesonide-formoterol (SYMBICORT) 160-4.5 MCG/ACT inhaler Inhale 2 puffs into the lungs daily.    Historical Provider, MD  cyclobenzaprine (FLEXERIL) 10 MG tablet Take 1 tablet (10 mg total) by mouth 2 (two) times daily as needed for muscle spasms.  10/11/13   Al Corpus, PA-C  folic acid (FOLVITE) 1 MG tablet Take 1 tablet (1 mg total) by mouth daily. 12/13/13   Nishant Dhungel, MD  HYDROcodone-acetaminophen (NORCO) 10-325 MG per tablet Take 2 tablets by mouth every 6 (six) hours as needed for moderate pain. 12/13/13   Nishant Dhungel, MD  hydrOXYzine (ATARAX/VISTARIL) 25 MG tablet Take 25 mg by mouth every 6 (six) hours as needed for anxiety or itching (using mainly for 'anxiety'........states he's taken 1-2 and it doesn't really help).    Historical Provider, MD  LORazepam (ATIVAN) 1 MG tablet Take 1 tablet (1 mg total) by mouth every 8 (eight) hours as needed for anxiety. 12/13/13   Nishant Dhungel, MD  methocarbamol (ROBAXIN) 500 MG tablet Take 1 tablet (500 mg total) by mouth every 6 (six) hours as needed for muscle spasms. 08/09/14   Kristeen Miss, MD  metoprolol succinate (TOPROL-XL) 50 MG 24 hr tablet Take 50 mg by mouth daily. Take with or immediately following a meal.    Historical Provider, MD  Naproxen Sodium 220 MG CAPS Take 1 capsule by mouth daily.    Historical Provider, MD  Omega-3 Fatty Acids (FISH OIL) 1000 MG CAPS Take 1,000 mg by mouth daily.    Historical Provider, MD  oxyCODONE-acetaminophen (PERCOCET) 10-325 MG per tablet Take 1 tablet by mouth every 8 (eight) hours as needed. for pain 07/05/14   Historical Provider, MD  oxyCODONE-acetaminophen (PERCOCET) 10-325 MG per tablet Take 1 tablet by mouth every 4 (four) hours as needed for pain. 08/09/14   Kristeen Miss, MD  pantoprazole (PROTONIX) 40 MG tablet Take 1 tablet (40 mg total) by mouth 2 (two) times daily. 08/01/13   Irene Shipper, MD  thiamine 100 MG tablet Take 1 tablet (100 mg total) by mouth daily. 12/13/13   Nishant Dhungel, MD  Tiotropium Bromide Monohydrate (SPIRIVA RESPIMAT) 2.5 MCG/ACT AERS Inhale 1 capsule into the lungs 2 (two) times daily.    Historical Provider, MD  valsartan (DIOVAN) 320 MG tablet Take 320 mg by mouth daily.    Historical Provider, MD   BP  116/66 mmHg  Pulse 66  Temp(Src) 98 F (36.7 C) (Oral)  Resp 18  SpO2 99% Physical Exam  Constitutional: He is oriented to person, place, and time. He appears well-developed and well-nourished.  HENT:  Head: Normocephalic and atraumatic.  Mouth/Throat: Oropharynx is clear and moist.  Eyes: Conjunctivae are normal. Pupils are equal, round, and reactive to light. Right eye exhibits no discharge. Left eye exhibits no discharge. No scleral icterus.  Neck: Neck supple.  Cardiovascular: Normal rate, regular rhythm and normal heart sounds.  Exam reveals no gallop and no friction rub.   No murmur heard. Pulmonary/Chest: Effort normal and breath sounds normal. No respiratory distress. He has no rales.  Mild wheezing which is baseline for patient.  Abdominal:  Soft. There is no tenderness.  Musculoskeletal: He exhibits no tenderness.  Surgical incision over her lumbar spine appears to be healing well without any evidence of erythema or drainage.  Bilateral pretibial edema 1+., Ankle edema 2+. Negative Homans sign. No tenderness along the venous system. No erythema or leg tenderness.  Neurological: He is alert and oriented to person, place, and time.  Cranial Nerves II-XII grossly intact  Skin: Skin is warm and dry. No rash noted.  Psychiatric: He has a normal mood and affect.  Nursing note and vitals reviewed.   ED Course  Procedures (including critical care time) Labs Review Labs Reviewed  CBC WITH DIFFERENTIAL/PLATELET - Abnormal; Notable for the following:    HCT 38.5 (*)    All other components within normal limits  BASIC METABOLIC PANEL - Abnormal; Notable for the following:    Sodium 134 (*)    Glucose, Bld 114 (*)    All other components within normal limits  BRAIN NATRIURETIC PEPTIDE    Imaging Review Dg Chest 2 View  08/23/2014   CLINICAL DATA:  Acute onset of bilateral leg swelling and difficulty breathing. Initial encounter.  EXAM: CHEST  2 VIEW  COMPARISON:  Chest  radiograph performed 12/07/2013  FINDINGS: The lungs are well-aerated. Mild vascular congestion is noted, with mild bibasilar atelectasis. There is no evidence of pleural effusion or pneumothorax.  The heart is normal in size; the mediastinal contour is within normal limits. No acute osseous abnormalities are seen. There is chronic deformity of the left mid clavicle.  IMPRESSION: Mild vascular congestion, with mild bibasilar atelectasis.   Electronically Signed   By: Garald Balding M.D.   On: 08/23/2014 19:29   I have personally reviewed and evaluated these images and lab results as part of my medical decision-making.   EKG Interpretation None     Meds given in ED:  Medications - No data to display  Discharge Medication List as of 08/23/2014  9:55 PM     Filed Vitals:   08/23/14 2145  BP: 116/66  Pulse: 66  Temp: 98 F (36.7 C)  Resp: 18    MDM  Vitals stable - WNL -afebrile Pt resting comfortably in ED. PE--physical exam shows mild bilateral lower extremity edema. Benign heart and lung exam Labwork--BNP 31, labs otherwise noncontributory.  Imaging--Chest x-ray shows mild vascular congestion  DDX--low suspicion heart failure exacerbation. Lower edema likely secondary to increased exercise and warm weather. No evidence of other acute or emergent pathology at this time.  I discussed all relevant lab findings and imaging results with pt and they verbalized understanding. Discussed f/u with PCP within 48 hrs and return precautions, pt very amenable to plan.  Final diagnoses:  Ankle swelling, unspecified laterality        Comer Locket, PA-C 08/24/14 [place], DO 08/24/14 1539

## 2014-08-23 NOTE — Discharge Instructions (Signed)
You were evaluated in the ED today for your leg swelling. There does not appear to be an emergent cause for your symptoms at this time. It is important. Follow-up with your primary care in 2-3 days for reevaluation. Return to ED for worsening symptoms like chestpain,shortness of breath,fever,unilateral leg swelling.  Edema Edema is an abnormal buildup of fluids in your bodytissues. Edema is somewhatdependent on gravity to pull the fluid to the lowest place in your body. That makes the condition more common in the legs and thighs (lower extremities). Painless swelling of the feet and ankles is common and becomes more likely as you get older. It is also common in looser tissues, like around your eyes.  When the affected area is squeezed, the fluid may move out of that spot and leave a dent for a few moments. This dent is called pitting.  CAUSES  There are many possible causes of edema. Eating too much salt and being on your feet or sitting for a long time can cause edema in your legs and ankles. Hot weather may make edema worse. Common medical causes of edema include:  Heart failure.  Liver disease.  Kidney disease.  Weak blood vessels in your legs.  Cancer.  An injury.  Pregnancy.  Some medications.  Obesity. SYMPTOMS  Edema is usually painless.Your skin may look swollen or shiny.  DIAGNOSIS  Your health care provider may be able to diagnose edema by asking about your medical history and doing a physical exam. You may need to have tests such as X-rays, an electrocardiogram, or blood tests to check for medical conditions that may cause edema.  TREATMENT  Edema treatment depends on the cause. If you have heart, liver, or kidney disease, you need the treatment appropriate for these conditions. General treatment may include:  Elevation of the affected body part above the level of your heart.  Compression of the affected body part. Pressure from elastic bandages or support stockings  squeezes the tissues and forces fluid back into the blood vessels. This keeps fluid from entering the tissues.  Restriction of fluid and salt intake.  Use of a water pill (diuretic). These medications are appropriate only for some types of edema. They pull fluid out of your body and make you urinate more often. This gets rid of fluid and reduces swelling, but diuretics can have side effects. Only use diuretics as directed by your health care provider. HOME CARE INSTRUCTIONS   Keep the affected body part above the level of your heart when you are lying down.   Do not sit still or stand for prolonged periods.   Do not put anything directly under your knees when lying down.  Do not wear constricting clothing or garters on your upper legs.   Exercise your legs to work the fluid back into your blood vessels. This may help the swelling go down.   Wear elastic bandages or support stockings to reduce ankle swelling as directed by your health care provider.   Eat a low-salt diet to reduce fluid if your health care provider recommends it.   Only take medicines as directed by your health care provider. SEEK MEDICAL CARE IF:   Your edema is not responding to treatment.  You have heart, liver, or kidney disease and notice symptoms of edema.  You have edema in your legs that does not improve after elevating them.   You have sudden and unexplained weight gain. SEEK IMMEDIATE MEDICAL CARE IF:   You develop shortness  of breath or chest pain.   You cannot breathe when you lie down.  You develop pain, redness, or warmth in the swollen areas.   You have heart, liver, or kidney disease and suddenly get edema.  You have a fever and your symptoms suddenly get worse. MAKE SURE YOU:   Understand these instructions.  Will watch your condition.  Will get help right away if you are not doing well or get worse. Document Released: 12/22/2004 Document Revised: 05/08/2013 Document  Reviewed: 10/14/2012 [facility] Patient Information 2015 [place], [place]. This information is not intended to replace advice given to you by your health care provider. Make sure you discuss any questions you have with your health care provider.

## 2014-08-23 NOTE — ED Notes (Signed)
Pt verbalized understanding of d/c instructions and no further questions. Pt stable and in NAD.

## 2014-11-21 ENCOUNTER — Encounter: Payer: Self-pay | Admitting: Internal Medicine

## 2014-12-03 DIAGNOSIS — L280 Lichen simplex chronicus: Secondary | ICD-10-CM | POA: Insufficient documentation

## 2015-01-21 ENCOUNTER — Encounter: Payer: Self-pay | Admitting: Internal Medicine

## 2015-03-06 DIAGNOSIS — K635 Polyp of colon: Secondary | ICD-10-CM

## 2015-03-06 HISTORY — DX: Polyp of colon: K63.5

## 2015-03-19 ENCOUNTER — Ambulatory Visit (AMBULATORY_SURGERY_CENTER): Payer: Self-pay | Admitting: *Deleted

## 2015-03-19 VITALS — Ht 74.0 in | Wt 261.0 lb

## 2015-03-19 DIAGNOSIS — Z1211 Encounter for screening for malignant neoplasm of colon: Secondary | ICD-10-CM

## 2015-03-19 MED ORDER — NA SULFATE-K SULFATE-MG SULF 17.5-3.13-1.6 GM/177ML PO SOLN: 1.0000 | Freq: Once | ORAL | Status: DC

## 2015-03-19 NOTE — Progress Notes (Signed)
No egg or soy allergy known to patient  No issues with past sedation with any surgeries  or procedures, no intubation problems  No diet pills per patient No home 02 use per patient  No blood thinners per patient  Pt denies issues with constipation  Pt has copd/  asthma

## 2015-04-02 ENCOUNTER — Encounter: Payer: Self-pay | Admitting: Internal Medicine

## 2015-04-02 ENCOUNTER — Ambulatory Visit (AMBULATORY_SURGERY_CENTER): Payer: Medicaid Other | Admitting: Internal Medicine

## 2015-04-02 VITALS — BP 100/69 | HR 86 | Temp 99.1°F | Resp 11 | Ht 74.0 in | Wt 261.0 lb

## 2015-04-02 DIAGNOSIS — Z1211 Encounter for screening for malignant neoplasm of colon: Secondary | ICD-10-CM | POA: Diagnosis not present

## 2015-04-02 DIAGNOSIS — D124 Benign neoplasm of descending colon: Secondary | ICD-10-CM | POA: Diagnosis not present

## 2015-04-02 DIAGNOSIS — D123 Benign neoplasm of transverse colon: Secondary | ICD-10-CM

## 2015-04-02 MED ORDER — SODIUM CHLORIDE 0.9 % IV SOLN: 500.0000 mL | INTRAVENOUS | Status: DC

## 2015-04-02 NOTE — Patient Instructions (Addendum)

## 2015-04-02 NOTE — Op Note (Signed)
[facility] Patient Name: Escher Certo Procedure Date: 04/02/2015 10:32 AM MRN: [id number] Endoscopist: Docia Chuck. Henrene Pastor , MD Age: 55 Referring MD:  Date of Birth: August 03, 1960 Gender: Male Procedure:                Colonoscopy with cold snare polypectomy -4 Indications:              Screening for colorectal malignant neoplasm Medicines:                Monitored Anesthesia Care Procedure:                Pre-Anesthesia Assessment:                           - Prior to the procedure, a History and Physical                            was performed, and patient medications and                            allergies were reviewed. The patient's tolerance of                            previous anesthesia was also reviewed. The risks                            and benefits of the procedure and the sedation                            options and risks were discussed with the patient.                            All questions were answered, and informed consent                            was obtained. Prior Anticoagulants: The patient has                            taken no previous anticoagulant or antiplatelet                            agents. ASA Grade Assessment: II - A patient with                            mild systemic disease. After reviewing the risks                            and benefits, the patient was deemed in                            satisfactory condition to undergo the procedure.                           After obtaining informed consent, the colonoscope  was passed under direct vision. Throughout the                            procedure, the patient's blood pressure, pulse, and                            oxygen saturations were monitored continuously. The                            Model CF-HQ190L [id number]) scope was introduced                            through the anus and advanced to the the cecum,                            identified by  appendiceal orifice and ileocecal                            valve. The colonoscopy was performed without                            difficulty. The patient tolerated the procedure                            well. The quality of the bowel preparation was                            excellent. The bowel preparation used was SUPREP.                            The ileocecal valve, appendiceal orifice, and                            rectum were photographed. Scope In: 10:48:12 AM Scope Out: 11:07:40 AM Scope Withdrawal Time: 0 hours 13 minutes 59 seconds  Total Procedure Duration: 0 hours 19 minutes 28 seconds  Findings:      The digital rectal exam was normal.      Four polyps were found in the descending colon(2) and transverse       colon(2). The polyps were 2 to 5 mm in size. These polyps were removed       with a cold snare. Resection and retrieval were complete.      Multiple diverticula were found in the left colon and right colon.      The entire examined colon appeared normal on direct and retroflexion       views. Complications:            No immediate complications. Estimated Blood Loss:     Estimated blood loss: none. Impression:               - Four 2 to 5 mm polyps in the descending colon and                            in the transverse colon, removed with a cold snare.  Resected and retrieved.                           - Diverticulosis in the left colon and in the right                            colon.                           - The entire examined colon is normal on direct and                            retroflexion views. Recommendation:           - Patient has a contact number available for                            emergencies. The signs and symptoms of potential                            delayed complications were discussed with the                            patient. Return to normal activities tomorrow.                            Written  discharge instructions were provided to the                            patient.                           - Resume previous diet.                           - Continue present medications.                           - Await pathology results.                           - Repeat colonoscopy in 3 years for surveillance. Procedure Code(s):        --- Professional ---                           [phone], Colonoscopy, flexible; with removal of                            tumor(s), polyp(s), or other lesion(s) by snare                            technique CPT copyright 2016 [employer]. All rights reserved. Docia Chuck. Henrene Pastor, MD 04/02/2015 11:20:50 AM This report has been signed electronically. Number of Addenda: 0 CC Letter to:             Dr. Billey Chang Referring MD:      Chesley Noon

## 2015-04-02 NOTE — Progress Notes (Signed)
Patient awakening,vss,report to rn 

## 2015-04-02 NOTE — Progress Notes (Signed)
Called to room to assist during endoscopic procedure.  Patient ID and intended procedure confirmed with present staff. Received instructions for my participation in the procedure from the performing physician.  

## 2015-04-03 ENCOUNTER — Telehealth: Payer: Self-pay

## 2015-04-03 NOTE — Telephone Encounter (Signed)
  Follow up Call-  Call back number 04/02/2015 09/19/2013  Post procedure Call Back phone  # [phone]  Permission to leave phone message Yes Yes     Patient questions:  Do you have a fever, pain , or abdominal swelling? No. Pain Score  0 *  Have you tolerated food without any problems? Yes.    Have you been able to return to your normal activities? Yes.    Do you have any questions about your discharge instructions: Diet   No. Medications  No. Follow up visit  No.  Do you have questions or concerns about your Care? No.  Actions: * If pain score is 4 or above: No action needed, pain <4.  No problems per the pt. maw

## 2015-04-08 ENCOUNTER — Encounter: Payer: Self-pay | Admitting: Internal Medicine

## 2015-04-10 ENCOUNTER — Other Ambulatory Visit: Payer: Self-pay | Admitting: Internal Medicine

## 2015-04-11 MED ORDER — PANTOPRAZOLE SODIUM 40 MG PO TBEC: 40.0000 mg | DELAYED_RELEASE_TABLET | Freq: Two times a day (BID) | ORAL | Status: DC

## 2015-04-11 NOTE — Telephone Encounter (Signed)
Refilled protonix to [facility]

## 2015-05-20 ENCOUNTER — Ambulatory Visit: Payer: Medicaid Other | Attending: Neurological Surgery | Admitting: Physical Therapy

## 2015-06-20 IMAGING — CR DG CHEST 1V PORT
1 series · 1 of 1 positions shown · non-contrast
Comparison: 08/05/2012

CLINICAL DATA: Shortness of breath

EXAM:
PORTABLE CHEST - 1 VIEW

[AP]
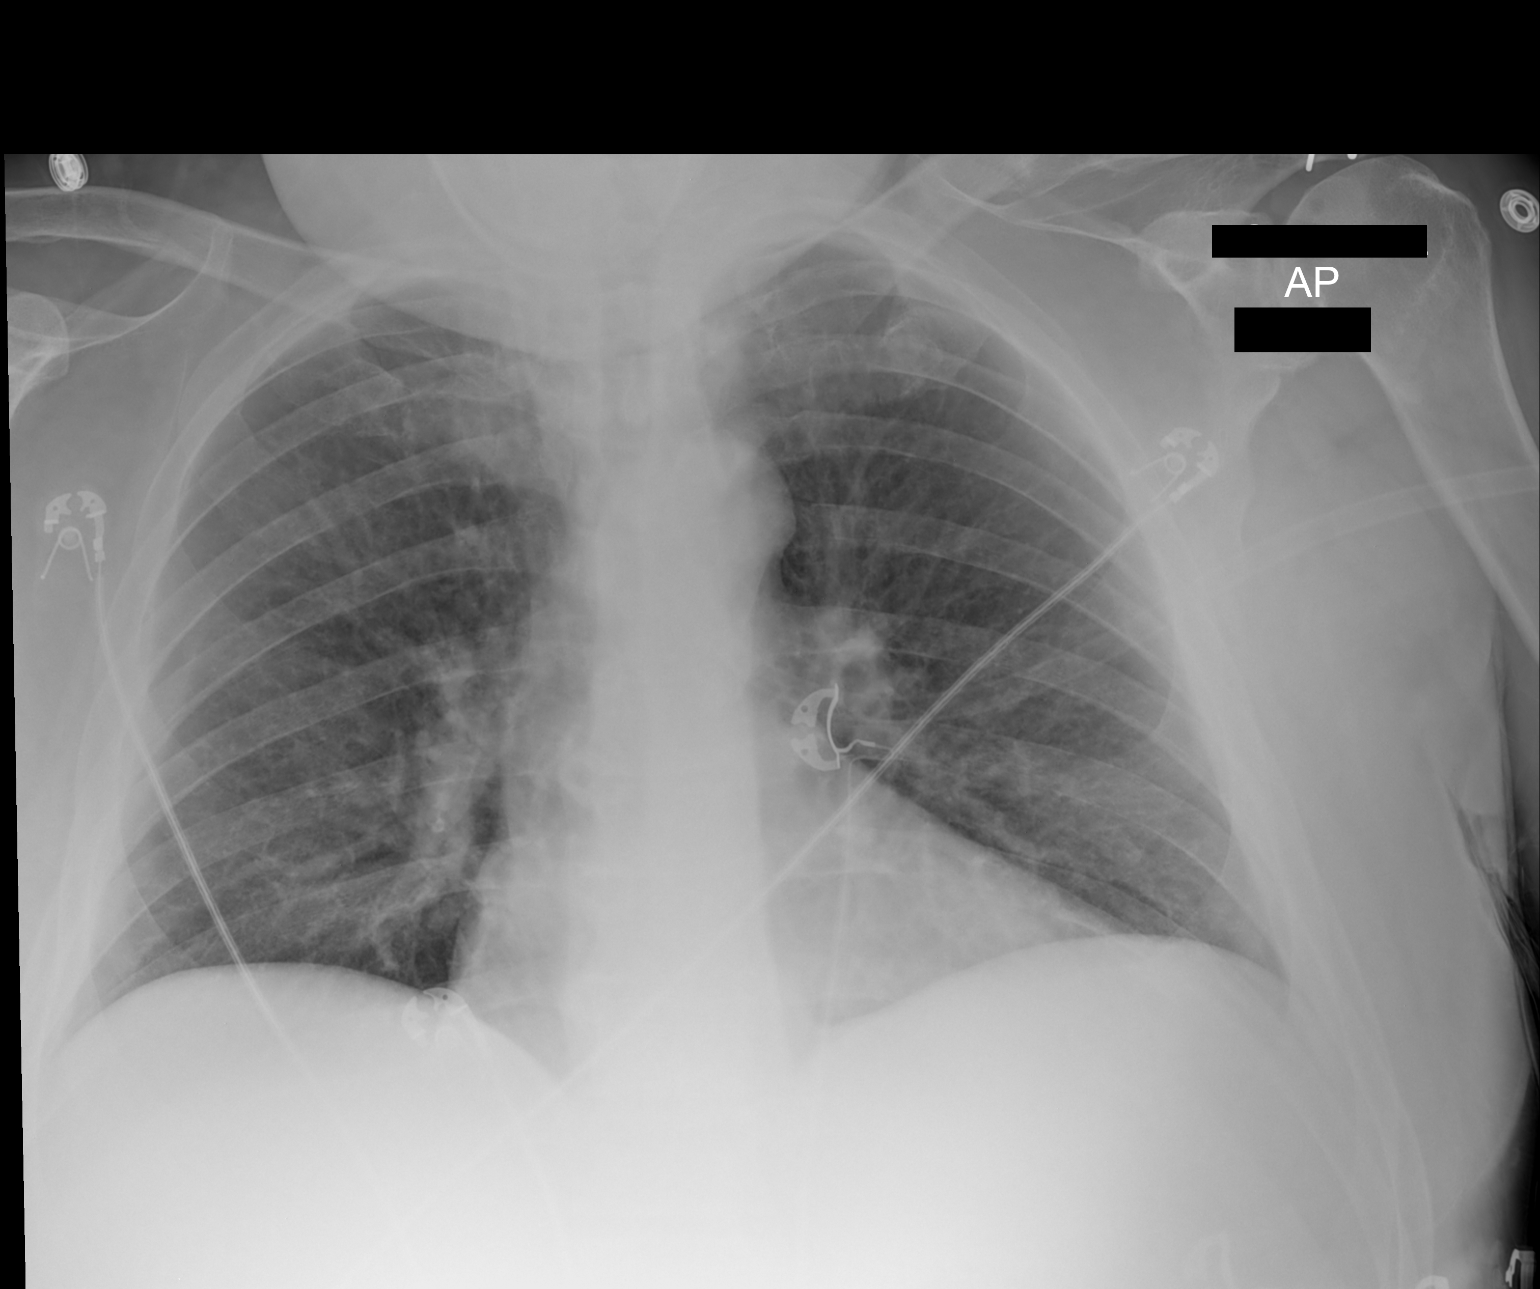

[1 of 1 positions shown; findings below may reference images not displayed]

FINDINGS: No cardiomegaly. Convexity of the upper right mediastinum unchanged
from priors, especially when correlating with chest CT 05/06/2009.
No infiltrate, edema, effusion, or pneumothorax. Remote mid left
clavicle fracture.
IMPRESSION: No active disease.

## 2015-06-27 DIAGNOSIS — E782 Mixed hyperlipidemia: Secondary | ICD-10-CM | POA: Insufficient documentation

## 2015-08-08 ENCOUNTER — Other Ambulatory Visit: Payer: Self-pay | Admitting: Internal Medicine

## 2015-08-13 ENCOUNTER — Telehealth: Payer: Self-pay | Admitting: Internal Medicine

## 2015-08-14 MED ORDER — PANTOPRAZOLE SODIUM 40 MG PO TBEC: 40.0000 mg | DELAYED_RELEASE_TABLET | Freq: Two times a day (BID) | ORAL | 2 refills | Status: AC

## 2015-08-14 NOTE — Telephone Encounter (Signed)
Refilled Protonix per patient's request.  He must keep his upcoming appointment for further refills

## 2015-09-16 IMAGING — CR DG CHEST 1V PORT
1 series · 1 of 1 positions shown · non-contrast
Comparison: 10/05/2012

CLINICAL DATA: Apnea

EXAM:
PORTABLE CHEST - 1 VIEW

[AP]
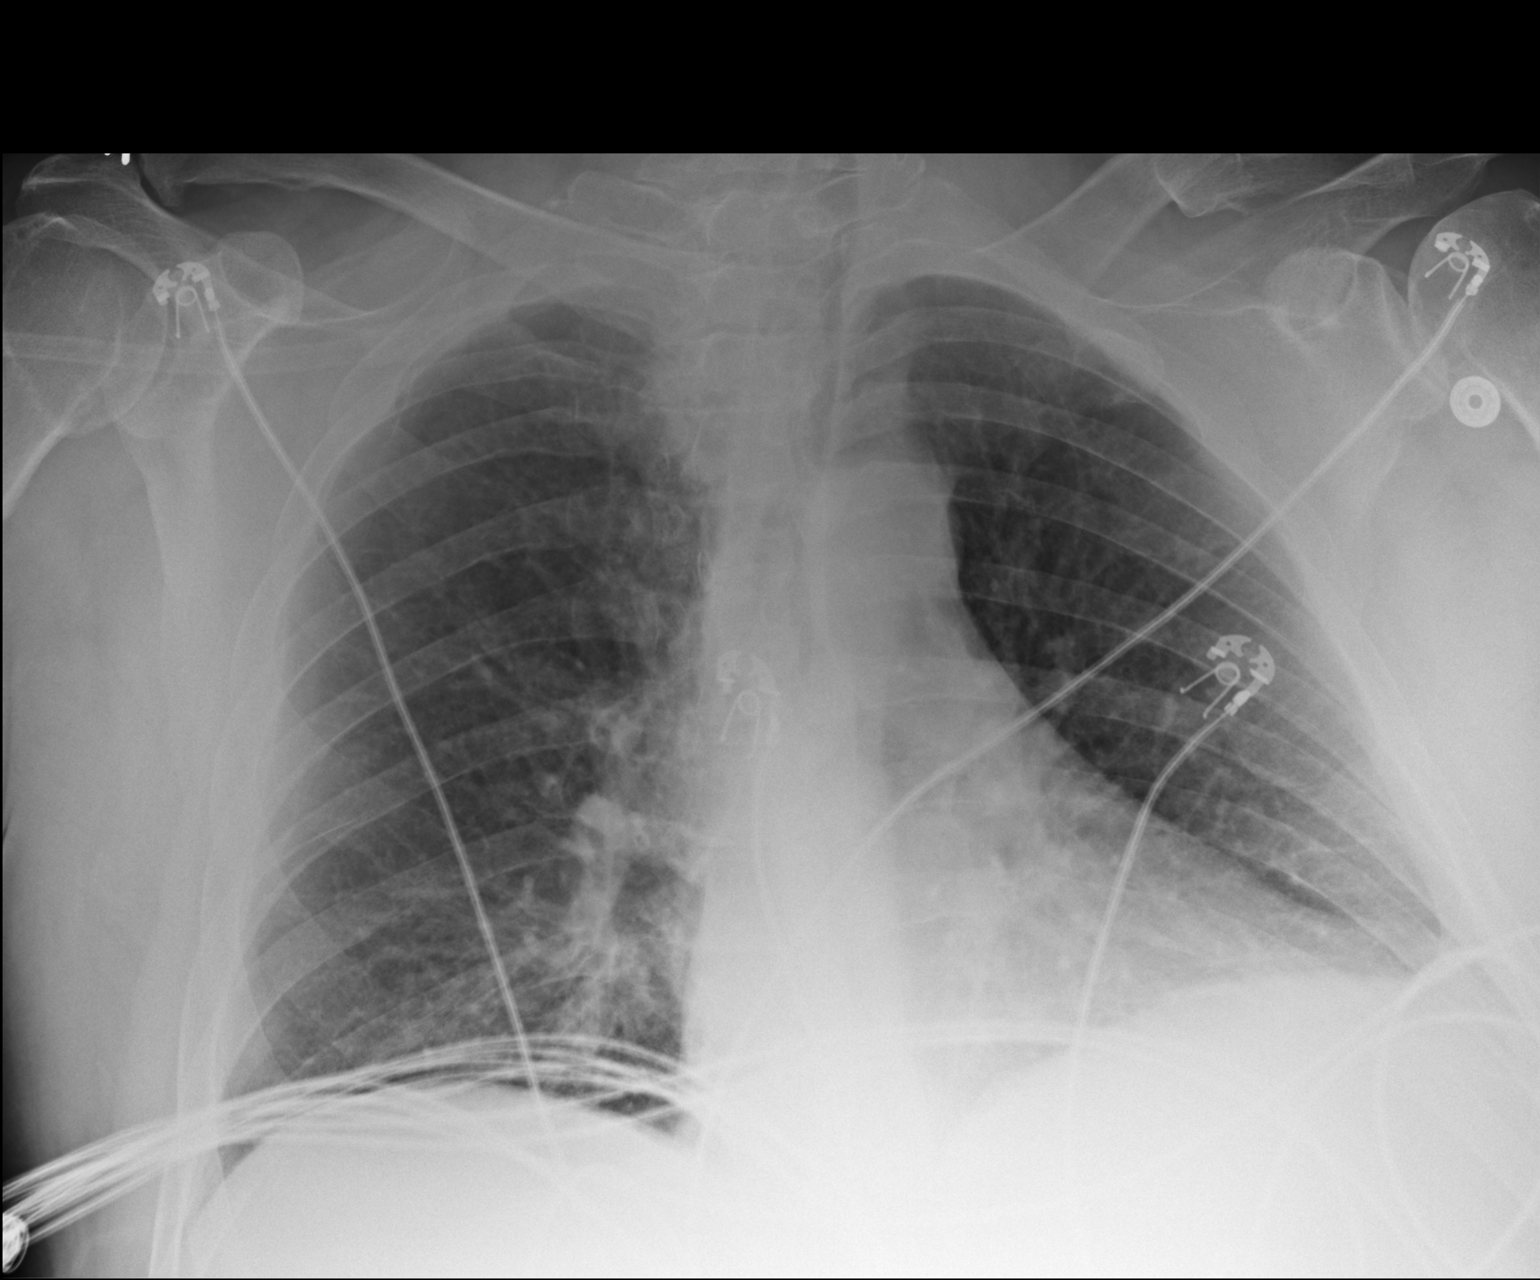

[1 of 1 positions shown; findings below may reference images not displayed]

FINDINGS: Low lung volumes with chronic interstitial coarsening, less severe
than previously when there may have been superimposed edema. No
effusion, acute infiltrate, or edema. Possible cardiomegaly,
although cardiomediastinal silhouette distorted by leftward
rotation. Remote left mid clavicle fracture.
IMPRESSION: Low lung volumes and chronic bronchitic opacity.

## 2015-10-07 ENCOUNTER — Ambulatory Visit: Payer: Self-pay | Admitting: Internal Medicine

## 2015-10-07 ENCOUNTER — Other Ambulatory Visit: Payer: Self-pay

## 2015-11-25 ENCOUNTER — Ambulatory Visit: Payer: Self-pay | Admitting: Internal Medicine

## 2016-01-14 ENCOUNTER — Emergency Department (HOSPITAL_COMMUNITY): Payer: Medicare Other

## 2016-01-14 ENCOUNTER — Emergency Department (HOSPITAL_COMMUNITY)
Admission: EM | Admit: 2016-01-14 | Discharge: 2016-01-14 | Disposition: A | Discharge status: Home / Self Care | Payer: Medicare Other | Attending: Physician Assistant | Admitting: Physician Assistant

## 2016-01-14 ENCOUNTER — Encounter (HOSPITAL_COMMUNITY): Payer: Self-pay | Admitting: Emergency Medicine

## 2016-01-14 DIAGNOSIS — S0990XA Unspecified injury of head, initial encounter: Secondary | ICD-10-CM

## 2016-01-14 DIAGNOSIS — Y929 Unspecified place or not applicable: Secondary | ICD-10-CM | POA: Insufficient documentation

## 2016-01-14 DIAGNOSIS — F17210 Nicotine dependence, cigarettes, uncomplicated: Secondary | ICD-10-CM | POA: Insufficient documentation

## 2016-01-14 DIAGNOSIS — Z79899 Other long term (current) drug therapy: Secondary | ICD-10-CM | POA: Diagnosis not present

## 2016-01-14 DIAGNOSIS — Y939 Activity, unspecified: Secondary | ICD-10-CM | POA: Diagnosis not present

## 2016-01-14 DIAGNOSIS — Y999 Unspecified external cause status: Secondary | ICD-10-CM | POA: Insufficient documentation

## 2016-01-14 DIAGNOSIS — S50812A Abrasion of left forearm, initial encounter: Secondary | ICD-10-CM | POA: Insufficient documentation

## 2016-01-14 DIAGNOSIS — S0101XA Laceration without foreign body of scalp, initial encounter: Secondary | ICD-10-CM | POA: Insufficient documentation

## 2016-01-14 DIAGNOSIS — T07XXXA Unspecified multiple injuries, initial encounter: Secondary | ICD-10-CM

## 2016-01-14 DIAGNOSIS — Z23 Encounter for immunization: Secondary | ICD-10-CM | POA: Insufficient documentation

## 2016-01-14 LAB — URINALYSIS, ROUTINE W REFLEX MICROSCOPIC
Bilirubin Urine: NEGATIVE
GLUCOSE, UA: NEGATIVE mg/dL
Hgb urine dipstick: NEGATIVE
Ketones, ur: NEGATIVE mg/dL
LEUKOCYTES UA: NEGATIVE
NITRITE: NEGATIVE
Protein, ur: NEGATIVE mg/dL
SPECIFIC GRAVITY, URINE: 1.006 (ref 1.005–1.030)
pH: 6 (ref 5.0–8.0)

## 2016-01-14 LAB — BASIC METABOLIC PANEL
Anion gap: 9 (ref 5–15)
BUN: 14 mg/dL (ref 6–20)
CALCIUM: 8.9 mg/dL (ref 8.9–10.3)
CO2: 24 mmol/L (ref 22–32)
CREATININE: 0.88 mg/dL (ref 0.61–1.24)
Chloride: 101 mmol/L (ref 101–111)
Glucose, Bld: 90 mg/dL (ref 65–99)
Potassium: 4.4 mmol/L (ref 3.5–5.1)
SODIUM: 134 mmol/L — AB (ref 135–145)

## 2016-01-14 LAB — CBC WITH DIFFERENTIAL/PLATELET
BASOS PCT: 1 %
Basophils Absolute: 0 10*3/uL (ref 0.0–0.1)
EOS ABS: 0.2 10*3/uL (ref 0.0–0.7)
EOS PCT: 3 %
HCT: 36.7 % — ABNORMAL LOW (ref 39.0–52.0)
HEMOGLOBIN: 12.8 g/dL — AB (ref 13.0–17.0)
Lymphocytes Relative: 32 %
Lymphs Abs: 2 10*3/uL (ref 0.7–4.0)
MCH: 33.4 pg (ref 26.0–34.0)
MCHC: 34.9 g/dL (ref 30.0–36.0)
MCV: 95.8 fL (ref 78.0–100.0)
MONOS PCT: 7 %
Monocytes Absolute: 0.5 10*3/uL (ref 0.1–1.0)
Neutro Abs: 3.5 10*3/uL (ref 1.7–7.7)
Neutrophils Relative %: 57 %
PLATELETS: 236 10*3/uL (ref 150–400)
RBC: 3.83 MIL/uL — ABNORMAL LOW (ref 4.22–5.81)
RDW: 14.4 % (ref 11.5–15.5)
WBC: 6.1 10*3/uL (ref 4.0–10.5)

## 2016-01-14 LAB — RAPID URINE DRUG SCREEN, HOSP PERFORMED
Amphetamines: NOT DETECTED
Barbiturates: NOT DETECTED
Benzodiazepines: POSITIVE — AB
COCAINE: NOT DETECTED
OPIATES: NOT DETECTED
Tetrahydrocannabinol: NOT DETECTED

## 2016-01-14 MED ORDER — SODIUM CHLORIDE 0.9 % IV BOLUS (SEPSIS)
1000.0000 mL | Freq: Once | INTRAVENOUS | Status: AC
  Administered 2016-01-14: 1000 mL via INTRAVENOUS

## 2016-01-14 MED ORDER — TETANUS-DIPHTH-ACELL PERTUSSIS 5-2.5-18.5 LF-MCG/0.5 IM SUSP
0.5000 mL | Freq: Once | INTRAMUSCULAR | Status: AC
  Administered 2016-01-14: 0.5 mL via INTRAMUSCULAR
  Filled 2016-01-14: qty 0.5

## 2016-01-14 MED ORDER — OXYCODONE-ACETAMINOPHEN 5-325 MG PO TABS: 2.0000 | ORAL_TABLET | ORAL | 0 refills | Status: AC | PRN

## 2016-01-14 MED ORDER — FENTANYL CITRATE (PF) 100 MCG/2ML IJ SOLN
50.0000 ug | Freq: Once | INTRAMUSCULAR | Status: AC
  Administered 2016-01-14: 50 ug via INTRAVENOUS
  Filled 2016-01-14: qty 2

## 2016-01-14 MED ORDER — HYDROCODONE-ACETAMINOPHEN 5-325 MG PO TABS
2.0000 | ORAL_TABLET | Freq: Once | ORAL | Status: AC
  Administered 2016-01-14: 2 via ORAL
  Filled 2016-01-14: qty 2

## 2016-01-14 NOTE — ED Notes (Signed)
Patient reports that he had a couple of beers and took his usual dose of Klonopin. Patient states a person living with him reported that they did CPR on him because they could not get him awake prior to EMS arrival.

## 2016-01-14 NOTE — ED Notes (Signed)
Patient ambulated from the bed to the door without assistance.
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
## 2016-01-14 NOTE — ED Notes (Signed)
Patient transported to CT 

## 2016-01-14 NOTE — Discharge Instructions (Signed)
Please read the attached information on "stitches, staples and adhesive wound closure" and "suture removal care after" and "contusion".  You have been prescribed a short dose of percocet for pain.  Please take this as prescribed and avoid consuming alcohol or driving after taking this medication as it may cause drowsiness.   Place ice over sore areas.   Follow up with your primary care provider for suture/staple removal and referral for orthopedist for your shoulder concerns.
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
## 2016-03-05 DIAGNOSIS — E876 Hypokalemia: Secondary | ICD-10-CM

## 2016-03-05 DIAGNOSIS — D500 Iron deficiency anemia secondary to blood loss (chronic): Secondary | ICD-10-CM

## 2016-03-05 DIAGNOSIS — E119 Type 2 diabetes mellitus without complications: Secondary | ICD-10-CM

## 2016-03-05 DIAGNOSIS — E871 Hypo-osmolality and hyponatremia: Secondary | ICD-10-CM

## 2016-03-05 HISTORY — DX: Hypo-osmolality and hyponatremia: E87.1

## 2016-03-05 HISTORY — DX: Type 2 diabetes mellitus without complications: E11.9

## 2016-03-05 HISTORY — DX: Iron deficiency anemia secondary to blood loss (chronic): D50.0

## 2016-03-05 HISTORY — DX: Hypocalcemia: E83.51

## 2016-03-05 HISTORY — DX: Hypokalemia: E87.6

## 2016-03-23 ENCOUNTER — Emergency Department (HOSPITAL_COMMUNITY): Payer: Medicare Other

## 2016-03-23 ENCOUNTER — Encounter (HOSPITAL_COMMUNITY): Admission: EM | Disposition: E | Payer: Self-pay | Source: Home / Self Care | Attending: Internal Medicine

## 2016-03-23 ENCOUNTER — Inpatient Hospital Stay (HOSPITAL_COMMUNITY)
Admission: EM | Admit: 2016-03-23 | Discharge: 2016-05-05 | DRG: 380 | Disposition: E | Payer: Medicare Other | Attending: Internal Medicine | Admitting: Internal Medicine

## 2016-03-23 ENCOUNTER — Encounter (HOSPITAL_COMMUNITY): Payer: Self-pay

## 2016-03-23 ENCOUNTER — Inpatient Hospital Stay (HOSPITAL_COMMUNITY): Payer: Medicare Other

## 2016-03-23 DIAGNOSIS — J441 Chronic obstructive pulmonary disease with (acute) exacerbation: Secondary | ICD-10-CM | POA: Diagnosis not present

## 2016-03-23 DIAGNOSIS — I468 Cardiac arrest due to other underlying condition: Secondary | ICD-10-CM | POA: Diagnosis not present

## 2016-03-23 DIAGNOSIS — K2971 Gastritis, unspecified, with bleeding: Secondary | ICD-10-CM

## 2016-03-23 DIAGNOSIS — E1165 Type 2 diabetes mellitus with hyperglycemia: Secondary | ICD-10-CM | POA: Diagnosis present

## 2016-03-23 DIAGNOSIS — F1020 Alcohol dependence, uncomplicated: Secondary | ICD-10-CM | POA: Diagnosis present

## 2016-03-23 DIAGNOSIS — K2211 Ulcer of esophagus with bleeding: Principal | ICD-10-CM | POA: Diagnosis present

## 2016-03-23 DIAGNOSIS — K7290 Hepatic failure, unspecified without coma: Secondary | ICD-10-CM | POA: Diagnosis present

## 2016-03-23 DIAGNOSIS — E871 Hypo-osmolality and hyponatremia: Secondary | ICD-10-CM | POA: Diagnosis present

## 2016-03-23 DIAGNOSIS — G9341 Metabolic encephalopathy: Secondary | ICD-10-CM | POA: Diagnosis present

## 2016-03-23 DIAGNOSIS — N19 Unspecified kidney failure: Secondary | ICD-10-CM

## 2016-03-23 DIAGNOSIS — J9600 Acute respiratory failure, unspecified whether with hypoxia or hypercapnia: Secondary | ICD-10-CM | POA: Diagnosis not present

## 2016-03-23 DIAGNOSIS — K8500 Idiopathic acute pancreatitis without necrosis or infection: Secondary | ICD-10-CM | POA: Insufficient documentation

## 2016-03-23 DIAGNOSIS — F17210 Nicotine dependence, cigarettes, uncomplicated: Secondary | ICD-10-CM | POA: Diagnosis present

## 2016-03-23 DIAGNOSIS — Z79899 Other long term (current) drug therapy: Secondary | ICD-10-CM

## 2016-03-23 DIAGNOSIS — Z8719 Personal history of other diseases of the digestive system: Secondary | ICD-10-CM

## 2016-03-23 DIAGNOSIS — J9690 Respiratory failure, unspecified, unspecified whether with hypoxia or hypercapnia: Secondary | ICD-10-CM

## 2016-03-23 DIAGNOSIS — K7200 Acute and subacute hepatic failure without coma: Secondary | ICD-10-CM | POA: Diagnosis not present

## 2016-03-23 DIAGNOSIS — K920 Hematemesis: Secondary | ICD-10-CM | POA: Diagnosis not present

## 2016-03-23 DIAGNOSIS — N179 Acute kidney failure, unspecified: Secondary | ICD-10-CM

## 2016-03-23 DIAGNOSIS — R6521 Severe sepsis with septic shock: Secondary | ICD-10-CM | POA: Diagnosis not present

## 2016-03-23 DIAGNOSIS — F329 Major depressive disorder, single episode, unspecified: Secondary | ICD-10-CM | POA: Diagnosis present

## 2016-03-23 DIAGNOSIS — N170 Acute kidney failure with tubular necrosis: Secondary | ICD-10-CM | POA: Diagnosis present

## 2016-03-23 DIAGNOSIS — R579 Shock, unspecified: Secondary | ICD-10-CM | POA: Diagnosis not present

## 2016-03-23 DIAGNOSIS — E875 Hyperkalemia: Secondary | ICD-10-CM | POA: Diagnosis present

## 2016-03-23 DIAGNOSIS — E870 Hyperosmolality and hypernatremia: Secondary | ICD-10-CM | POA: Diagnosis not present

## 2016-03-23 DIAGNOSIS — I248 Other forms of acute ischemic heart disease: Secondary | ICD-10-CM | POA: Diagnosis present

## 2016-03-23 DIAGNOSIS — Z9289 Personal history of other medical treatment: Secondary | ICD-10-CM

## 2016-03-23 DIAGNOSIS — K2921 Alcoholic gastritis with bleeding: Secondary | ICD-10-CM | POA: Diagnosis not present

## 2016-03-23 DIAGNOSIS — F419 Anxiety disorder, unspecified: Secondary | ICD-10-CM | POA: Diagnosis present

## 2016-03-23 DIAGNOSIS — E8809 Other disorders of plasma-protein metabolism, not elsewhere classified: Secondary | ICD-10-CM | POA: Diagnosis present

## 2016-03-23 DIAGNOSIS — J449 Chronic obstructive pulmonary disease, unspecified: Secondary | ICD-10-CM | POA: Diagnosis not present

## 2016-03-23 DIAGNOSIS — Z66 Do not resuscitate: Secondary | ICD-10-CM | POA: Diagnosis not present

## 2016-03-23 DIAGNOSIS — E861 Hypovolemia: Secondary | ICD-10-CM | POA: Diagnosis present

## 2016-03-23 DIAGNOSIS — I110 Hypertensive heart disease with heart failure: Secondary | ICD-10-CM | POA: Diagnosis present

## 2016-03-23 DIAGNOSIS — Z6839 Body mass index (BMI) 39.0-39.9, adult: Secondary | ICD-10-CM

## 2016-03-23 DIAGNOSIS — E669 Obesity, unspecified: Secondary | ICD-10-CM | POA: Diagnosis present

## 2016-03-23 DIAGNOSIS — F13239 Sedative, hypnotic or anxiolytic dependence with withdrawal, unspecified: Secondary | ICD-10-CM | POA: Diagnosis present

## 2016-03-23 DIAGNOSIS — Z01818 Encounter for other preprocedural examination: Secondary | ICD-10-CM

## 2016-03-23 DIAGNOSIS — K5900 Constipation, unspecified: Secondary | ICD-10-CM | POA: Diagnosis not present

## 2016-03-23 DIAGNOSIS — J690 Pneumonitis due to inhalation of food and vomit: Secondary | ICD-10-CM

## 2016-03-23 DIAGNOSIS — K210 Gastro-esophageal reflux disease with esophagitis: Secondary | ICD-10-CM | POA: Diagnosis not present

## 2016-03-23 DIAGNOSIS — K859 Acute pancreatitis without necrosis or infection, unspecified: Secondary | ICD-10-CM

## 2016-03-23 DIAGNOSIS — K760 Fatty (change of) liver, not elsewhere classified: Secondary | ICD-10-CM | POA: Diagnosis present

## 2016-03-23 DIAGNOSIS — K5730 Diverticulosis of large intestine without perforation or abscess without bleeding: Secondary | ICD-10-CM | POA: Diagnosis present

## 2016-03-23 DIAGNOSIS — Z833 Family history of diabetes mellitus: Secondary | ICD-10-CM

## 2016-03-23 DIAGNOSIS — A419 Sepsis, unspecified organism: Secondary | ICD-10-CM | POA: Diagnosis not present

## 2016-03-23 DIAGNOSIS — R140 Abdominal distension (gaseous): Secondary | ICD-10-CM

## 2016-03-23 DIAGNOSIS — F1010 Alcohol abuse, uncomplicated: Secondary | ICD-10-CM | POA: Diagnosis not present

## 2016-03-23 DIAGNOSIS — Z515 Encounter for palliative care: Secondary | ICD-10-CM | POA: Diagnosis not present

## 2016-03-23 DIAGNOSIS — Z9911 Dependence on respirator [ventilator] status: Secondary | ICD-10-CM

## 2016-03-23 DIAGNOSIS — R0603 Acute respiratory distress: Secondary | ICD-10-CM

## 2016-03-23 DIAGNOSIS — K7682 Hepatic encephalopathy: Secondary | ICD-10-CM

## 2016-03-23 DIAGNOSIS — J939 Pneumothorax, unspecified: Secondary | ICD-10-CM

## 2016-03-23 DIAGNOSIS — Z978 Presence of other specified devices: Secondary | ICD-10-CM

## 2016-03-23 DIAGNOSIS — Z4659 Encounter for fitting and adjustment of other gastrointestinal appliance and device: Secondary | ICD-10-CM

## 2016-03-23 DIAGNOSIS — K7210 Chronic hepatic failure without coma: Secondary | ICD-10-CM | POA: Diagnosis present

## 2016-03-23 DIAGNOSIS — I5023 Acute on chronic systolic (congestive) heart failure: Secondary | ICD-10-CM | POA: Diagnosis not present

## 2016-03-23 DIAGNOSIS — K922 Gastrointestinal hemorrhage, unspecified: Secondary | ICD-10-CM

## 2016-03-23 DIAGNOSIS — E860 Dehydration: Secondary | ICD-10-CM | POA: Diagnosis present

## 2016-03-23 DIAGNOSIS — J9601 Acute respiratory failure with hypoxia: Secondary | ICD-10-CM | POA: Diagnosis present

## 2016-03-23 DIAGNOSIS — G9340 Encephalopathy, unspecified: Secondary | ICD-10-CM | POA: Diagnosis not present

## 2016-03-23 DIAGNOSIS — J80 Acute respiratory distress syndrome: Secondary | ICD-10-CM | POA: Diagnosis not present

## 2016-03-23 DIAGNOSIS — K2270 Barrett's esophagus without dysplasia: Secondary | ICD-10-CM | POA: Diagnosis present

## 2016-03-23 DIAGNOSIS — Z8249 Family history of ischemic heart disease and other diseases of the circulatory system: Secondary | ICD-10-CM

## 2016-03-23 DIAGNOSIS — K2970 Gastritis, unspecified, without bleeding: Secondary | ICD-10-CM | POA: Diagnosis present

## 2016-03-23 DIAGNOSIS — D62 Acute posthemorrhagic anemia: Secondary | ICD-10-CM | POA: Diagnosis present

## 2016-03-23 DIAGNOSIS — E874 Mixed disorder of acid-base balance: Secondary | ICD-10-CM | POA: Diagnosis present

## 2016-03-23 DIAGNOSIS — R410 Disorientation, unspecified: Secondary | ICD-10-CM

## 2016-03-23 DIAGNOSIS — K2901 Acute gastritis with bleeding: Secondary | ICD-10-CM | POA: Diagnosis not present

## 2016-03-23 DIAGNOSIS — T17990A Other foreign object in respiratory tract, part unspecified in causing asphyxiation, initial encounter: Secondary | ICD-10-CM | POA: Diagnosis not present

## 2016-03-23 DIAGNOSIS — R578 Other shock: Secondary | ICD-10-CM | POA: Diagnosis present

## 2016-03-23 DIAGNOSIS — G4733 Obstructive sleep apnea (adult) (pediatric): Secondary | ICD-10-CM | POA: Diagnosis present

## 2016-03-23 DIAGNOSIS — Z79891 Long term (current) use of opiate analgesic: Secondary | ICD-10-CM

## 2016-03-23 HISTORY — DX: Other specified anxiety disorders: F41.8

## 2016-03-23 HISTORY — DX: Type 2 diabetes mellitus without complications: E11.9

## 2016-03-23 HISTORY — DX: Barrett's esophagus without dysplasia: K22.70

## 2016-03-23 HISTORY — DX: Iron deficiency anemia secondary to blood loss (chronic): D50.0

## 2016-03-23 HISTORY — PX: ESOPHAGOGASTRODUODENOSCOPY: SHX5428

## 2016-03-23 HISTORY — DX: Hypocalcemia: E83.51

## 2016-03-23 LAB — BASIC METABOLIC PANEL
Anion gap: 17 — ABNORMAL HIGH (ref 5–15)
Anion gap: 13 (ref 5–15)
BUN: 111 mg/dL — AB (ref 6–20)
BUN: 111 mg/dL — AB (ref 6–20)
CALCIUM: 7 mg/dL — AB (ref 8.9–10.3)
CALCIUM: 7.1 mg/dL — AB (ref 8.9–10.3)
CO2: 14 mmol/L — ABNORMAL LOW (ref 22–32)
CO2: 21 mmol/L — ABNORMAL LOW (ref 22–32)
CREATININE: 3.73 mg/dL — AB (ref 0.61–1.24)
Chloride: 93 mmol/L — ABNORMAL LOW (ref 101–111)
Chloride: 94 mmol/L — ABNORMAL LOW (ref 101–111)
Creatinine, Ser: 4.04 mg/dL — ABNORMAL HIGH (ref 0.61–1.24)
GFR calc Af Amer: 18 mL/min — ABNORMAL LOW (ref >60)
GFR calc Af Amer: 20 mL/min — ABNORMAL LOW (ref >60)
GFR, EST NON AFRICAN AMERICAN: 15 mL/min — AB (ref >60)
GFR, EST NON AFRICAN AMERICAN: 17 mL/min — AB (ref >60)
GLUCOSE: 233 mg/dL — AB (ref 65–99)
Glucose, Bld: 144 mg/dL — ABNORMAL HIGH (ref 65–99)
Potassium: 6.2 mmol/L — ABNORMAL HIGH (ref 3.5–5.1)
Potassium: 5.2 mmol/L — ABNORMAL HIGH (ref 3.5–5.1)
SODIUM: 124 mmol/L — AB (ref 135–145)
SODIUM: 128 mmol/L — AB (ref 135–145)

## 2016-03-23 LAB — COMPREHENSIVE METABOLIC PANEL
ALBUMIN: 2.4 g/dL — AB (ref 3.5–5.0)
ALK PHOS: 48 U/L (ref 38–126)
ALT: 51 U/L (ref 17–63)
AST: 93 U/L — ABNORMAL HIGH (ref 15–41)
Anion gap: 28 — ABNORMAL HIGH (ref 5–15)
BILIRUBIN TOTAL: 0.9 mg/dL (ref 0.3–1.2)
BUN: 103 mg/dL — AB (ref 6–20)
CALCIUM: 8.3 mg/dL — AB (ref 8.9–10.3)
CO2: 10 mmol/L — ABNORMAL LOW (ref 22–32)
Chloride: 83 mmol/L — ABNORMAL LOW (ref 101–111)
Creatinine, Ser: 4.22 mg/dL — ABNORMAL HIGH (ref 0.61–1.24)
GFR calc Af Amer: 17 mL/min — ABNORMAL LOW (ref >60)
GFR, EST NON AFRICAN AMERICAN: 15 mL/min — AB (ref >60)
Glucose, Bld: 146 mg/dL — ABNORMAL HIGH (ref 65–99)
Potassium: 6.2 mmol/L — ABNORMAL HIGH (ref 3.5–5.1)
Sodium: 121 mmol/L — ABNORMAL LOW (ref 135–145)
TOTAL PROTEIN: 5 g/dL — AB (ref 6.5–8.1)

## 2016-03-23 LAB — POCT I-STAT 3, ART BLOOD GAS (G3+)
ACID-BASE DEFICIT: 16 mmol/L — AB (ref 0.0–2.0)
ACID-BASE DEFICIT: 7 mmol/L — AB (ref 0.0–2.0)
Acid-base deficit: 5 mmol/L — ABNORMAL HIGH (ref 0.0–2.0)
BICARBONATE: 13.9 mmol/L — AB (ref 20.0–28.0)
BICARBONATE: 23.3 mmol/L (ref 20.0–28.0)
BICARBONATE: 23.9 mmol/L (ref 20.0–28.0)
O2 SAT: 100 %
O2 SAT: 89 %
O2 Saturation: 100 %
PCO2 ART: 66.5 mmHg — AB (ref 32.0–48.0)
PH ART: 7.164 — AB (ref 7.350–7.450)
PO2 ART: 269 mmHg — AB (ref 83.0–108.0)
PO2 ART: 232 mmHg — AB (ref 83.0–108.0)
PO2 ART: 74 mmHg — AB (ref 83.0–108.0)
Patient temperature: 98.6
Patient temperature: 98.5
TCO2: 16 mmol/L (ref 0–100)
TCO2: 25 mmol/L (ref 0–100)
TCO2: 26 mmol/L (ref 0–100)
pCO2 arterial: 56.9 mmHg — ABNORMAL HIGH (ref 32.0–48.0)
pCO2 arterial: 70.5 mmHg (ref 32.0–48.0)
pH, Arterial: 6.995 — CL (ref 7.350–7.450)
pH, Arterial: 7.126 — CL (ref 7.350–7.450)

## 2016-03-23 LAB — I-STAT VENOUS BLOOD GAS, ED
Acid-base deficit: 14 mmol/L — ABNORMAL HIGH (ref 0.0–2.0)
BICARBONATE: 12.4 mmol/L — AB (ref 20.0–28.0)
O2 Saturation: 48 %
PH VEN: 7.207 — AB (ref 7.250–7.430)
TCO2: 13 mmol/L (ref 0–100)
pCO2, Ven: 31.3 mmHg — ABNORMAL LOW (ref 44.0–60.0)
pO2, Ven: 31 mmHg — CL (ref 32.0–45.0)

## 2016-03-23 LAB — LACTATE DEHYDROGENASE: LDH: 517 U/L — AB (ref 98–192)

## 2016-03-23 LAB — I-STAT ARTERIAL BLOOD GAS, ED
Acid-base deficit: 13 mmol/L — ABNORMAL HIGH (ref 0.0–2.0)
BICARBONATE: 12.3 mmol/L — AB (ref 20.0–28.0)
O2 Saturation: 93 %
PCO2 ART: 24.5 mmHg — AB (ref 32.0–48.0)
PO2 ART: 72 mmHg — AB (ref 83.0–108.0)
TCO2: 13 mmol/L (ref 0–100)
pH, Arterial: 7.307 — ABNORMAL LOW (ref 7.350–7.450)

## 2016-03-23 LAB — POCT I-STAT, CHEM 8
BUN: 124 mg/dL — AB (ref 6–20)
CALCIUM ION: 1.08 mmol/L — AB (ref 1.15–1.40)
CHLORIDE: 91 mmol/L — AB (ref 101–111)
Creatinine, Ser: 3.7 mg/dL — ABNORMAL HIGH (ref 0.61–1.24)
GLUCOSE: 228 mg/dL — AB (ref 65–99)
HCT: 21 % — ABNORMAL LOW (ref 39.0–52.0)
Hemoglobin: 7.1 g/dL — ABNORMAL LOW (ref 13.0–17.0)
Potassium: 5.2 mmol/L — ABNORMAL HIGH (ref 3.5–5.1)
Sodium: 127 mmol/L — ABNORMAL LOW (ref 135–145)
TCO2: 24 mmol/L (ref 0–100)

## 2016-03-23 LAB — LIPASE, BLOOD: Lipase: 24 U/L (ref 11–51)

## 2016-03-23 LAB — CBC WITH DIFFERENTIAL/PLATELET
Basophils Absolute: 0 10*3/uL (ref 0.0–0.1)
Basophils Relative: 0 %
EOS PCT: 0 %
Eosinophils Absolute: 0 10*3/uL (ref 0.0–0.7)
HEMATOCRIT: 21.8 % — AB (ref 39.0–52.0)
HEMOGLOBIN: 7.5 g/dL — AB (ref 13.0–17.0)
LYMPHS PCT: 13 %
Lymphs Abs: 2.5 10*3/uL (ref 0.7–4.0)
MCH: 33.8 pg (ref 26.0–34.0)
MCHC: 34.4 g/dL (ref 30.0–36.0)
MCV: 98.2 fL (ref 78.0–100.0)
Monocytes Absolute: 2.3 10*3/uL — ABNORMAL HIGH (ref 0.1–1.0)
Monocytes Relative: 12 %
Neutro Abs: 14.3 10*3/uL — ABNORMAL HIGH (ref 1.7–7.7)
Neutrophils Relative %: 75 %
Platelets: 251 10*3/uL (ref 150–400)
RBC: 2.22 MIL/uL — AB (ref 4.22–5.81)
RDW: 14.3 % (ref 11.5–15.5)
WBC: 19.1 10*3/uL — ABNORMAL HIGH (ref 4.0–10.5)

## 2016-03-23 LAB — CBC
HEMATOCRIT: 15.6 % — AB (ref 39.0–52.0)
HEMOGLOBIN: 5.4 g/dL — AB (ref 13.0–17.0)
MCH: 34 pg (ref 26.0–34.0)
MCHC: 34.6 g/dL (ref 30.0–36.0)
MCV: 98.1 fL (ref 78.0–100.0)
PLATELETS: 188 10*3/uL (ref 150–400)
RBC: 1.59 MIL/uL — ABNORMAL LOW (ref 4.22–5.81)
RDW: 14.4 % (ref 11.5–15.5)
WBC: 12.6 10*3/uL — ABNORMAL HIGH (ref 4.0–10.5)

## 2016-03-23 LAB — URINALYSIS, ROUTINE W REFLEX MICROSCOPIC
BILIRUBIN URINE: NEGATIVE
Glucose, UA: 100 mg/dL — AB
Ketones, ur: 15 mg/dL — AB
NITRITE: NEGATIVE
PROTEIN: 30 mg/dL — AB
SPECIFIC GRAVITY, URINE: 1.02 (ref 1.005–1.030)
pH: 5.5 (ref 5.0–8.0)

## 2016-03-23 LAB — ETHANOL

## 2016-03-23 LAB — CBG MONITORING, ED: GLUCOSE-CAPILLARY: 153 mg/dL — AB (ref 65–99)

## 2016-03-23 LAB — RAPID URINE DRUG SCREEN, HOSP PERFORMED
AMPHETAMINES: NOT DETECTED
Barbiturates: NOT DETECTED
Benzodiazepines: NOT DETECTED
Cocaine: NOT DETECTED
OPIATES: NOT DETECTED
Tetrahydrocannabinol: NOT DETECTED

## 2016-03-23 LAB — MRSA PCR SCREENING: MRSA BY PCR: NEGATIVE

## 2016-03-23 LAB — GLUCOSE, CAPILLARY: Glucose-Capillary: 263 mg/dL — ABNORMAL HIGH (ref 65–99)

## 2016-03-23 LAB — LACTIC ACID, PLASMA
Lactic Acid, Venous: 8.8 mmol/L (ref 0.5–1.9)
Lactic Acid, Venous: 2.2 mmol/L (ref 0.5–1.9)

## 2016-03-23 LAB — URINALYSIS, MICROSCOPIC (REFLEX)

## 2016-03-23 LAB — OSMOLALITY: OSMOLALITY: 314 mosm/kg — AB (ref 275–295)

## 2016-03-23 LAB — PROTIME-INR
INR: 1.35
Prothrombin Time: 16.8 seconds — ABNORMAL HIGH (ref 11.4–15.2)

## 2016-03-23 LAB — OSMOLALITY, URINE: Osmolality, Ur: 373 mOsm/kg (ref 300–900)

## 2016-03-23 LAB — PREPARE RBC (CROSSMATCH)

## 2016-03-23 LAB — PROCALCITONIN: Procalcitonin: 4.16 ng/mL

## 2016-03-23 LAB — CG4 I-STAT (LACTIC ACID): LACTIC ACID, VENOUS: 1.87 mmol/L (ref 0.5–1.9)

## 2016-03-23 LAB — I-STAT CG4 LACTIC ACID, ED: Lactic Acid, Venous: 11.03 mmol/L (ref 0.5–1.9)

## 2016-03-23 LAB — CK: Total CK: 766 U/L — ABNORMAL HIGH (ref 49–397)

## 2016-03-23 LAB — AMMONIA: Ammonia: 116 umol/L — ABNORMAL HIGH (ref 9–35)

## 2016-03-23 LAB — SODIUM, URINE, RANDOM: Sodium, Ur: 13 mmol/L

## 2016-03-23 LAB — AMYLASE: Amylase: 51 U/L (ref 28–100)

## 2016-03-23 SURGERY — EGD (ESOPHAGOGASTRODUODENOSCOPY)
Anesthesia: Moderate Sedation

## 2016-03-23 MED ORDER — SODIUM CHLORIDE 0.9 % IV SOLN
20.0000 ug | Freq: Once | INTRAVENOUS | Status: AC
  Administered 2016-03-23: 20 ug via INTRAVENOUS
  Filled 2016-03-23: qty 5

## 2016-03-23 MED ORDER — SODIUM CHLORIDE 0.9 % IV SOLN
50.0000 ug/h | INTRAVENOUS | Status: DC
  Administered 2016-03-23 – 2016-03-24 (×2): 50 ug/h via INTRAVENOUS
  Filled 2016-03-23 (×2): qty 1

## 2016-03-23 MED ORDER — CHLORHEXIDINE GLUCONATE 0.12% ORAL RINSE (MEDLINE KIT)
15.0000 mL | Freq: Two times a day (BID) | OROMUCOSAL | Status: DC
  Administered 2016-03-23 – 2016-04-09 (×34): 15 mL via OROMUCOSAL

## 2016-03-23 MED ORDER — ORAL CARE MOUTH RINSE
15.0000 mL | Freq: Four times a day (QID) | OROMUCOSAL | Status: DC
  Administered 2016-03-24 – 2016-04-09 (×65): 15 mL via OROMUCOSAL

## 2016-03-23 MED ORDER — SODIUM CHLORIDE 0.9 % IV BOLUS (SEPSIS)
1000.0000 mL | Freq: Once | INTRAVENOUS | Status: AC
  Administered 2016-03-23: 1000 mL via INTRAVENOUS

## 2016-03-23 MED ORDER — DEXTROSE 5 % IV SOLN: 1.0000 g | INTRAVENOUS | Status: DC

## 2016-03-23 MED ORDER — ONDANSETRON HCL 4 MG/2ML IJ SOLN
4.0000 mg | Freq: Once | INTRAMUSCULAR | Status: AC
Start: 2016-03-23 — End: 2016-03-23
  Administered 2016-03-23: 4 mg via INTRAVENOUS
  Filled 2016-03-23 (×2): qty 2

## 2016-03-23 MED ORDER — MIDAZOLAM HCL 2 MG/2ML IJ SOLN
2.0000 mg | Freq: Once | INTRAMUSCULAR | Status: AC
  Administered 2016-03-23: 2 mg via INTRAVENOUS
  Filled 2016-03-23: qty 2

## 2016-03-23 MED ORDER — SODIUM CHLORIDE 0.9 % FOR CRRT
INTRAVENOUS_CENTRAL | Status: DC | PRN
  Filled 2016-03-23: qty 1000

## 2016-03-23 MED ORDER — MIDAZOLAM HCL 2 MG/2ML IJ SOLN
2.0000 mg | INTRAMUSCULAR | Status: AC | PRN
  Administered 2016-03-23 – 2016-03-24 (×3): 2 mg via INTRAVENOUS
  Filled 2016-03-23: qty 2

## 2016-03-23 MED ORDER — HEPARIN SODIUM (PORCINE) 1000 UNIT/ML DIALYSIS
1000.0000 [IU] | INTRAMUSCULAR | Status: DC | PRN
  Filled 2016-03-23: qty 6

## 2016-03-23 MED ORDER — PIPERACILLIN-TAZOBACTAM 3.375 G IVPB
3.3750 g | Freq: Three times a day (TID) | INTRAVENOUS | Status: DC
  Administered 2016-03-23: 3.375 g via INTRAVENOUS
  Filled 2016-03-23 (×2): qty 50

## 2016-03-23 MED ORDER — SODIUM CHLORIDE 0.9 % IV SOLN
20.0000 ug | Freq: Once | INTRAVENOUS | Status: DC
  Filled 2016-03-23: qty 5

## 2016-03-23 MED ORDER — SODIUM CHLORIDE 0.9 % IV SOLN: 250.0000 mL | INTRAVENOUS | Status: DC | PRN

## 2016-03-23 MED ORDER — SODIUM CHLORIDE 0.9 % IV SOLN
8.0000 mg/h | INTRAVENOUS | Status: AC
  Administered 2016-03-23 – 2016-03-26 (×7): 8 mg/h via INTRAVENOUS
  Filled 2016-03-23 (×14): qty 80

## 2016-03-23 MED ORDER — SODIUM CHLORIDE 0.9 % IV SOLN
Freq: Once | INTRAVENOUS | Status: DC
  Administered 2016-03-23: 21:00:00 via INTRAVENOUS

## 2016-03-23 MED ORDER — FOLIC ACID 5 MG/ML IJ SOLN
1.0000 mg | Freq: Every day | INTRAMUSCULAR | Status: DC
  Administered 2016-03-24 – 2016-03-29 (×6): 1 mg via INTRAVENOUS
  Filled 2016-03-23 (×7): qty 0.2

## 2016-03-23 MED ORDER — ASPIRIN 300 MG RE SUPP: 300.0000 mg | RECTAL | Status: DC

## 2016-03-23 MED ORDER — VANCOMYCIN HCL 10 G IV SOLR
1500.0000 mg | INTRAVENOUS | Status: DC
  Administered 2016-03-24: 1500 mg via INTRAVENOUS
  Filled 2016-03-23 (×2): qty 1500

## 2016-03-23 MED ORDER — SODIUM BICARBONATE 8.4 % IV SOLN
INTRAVENOUS | Status: DC
  Filled 2016-03-23 (×2): qty 150

## 2016-03-23 MED ORDER — PIPERACILLIN-TAZOBACTAM 3.375 G IVPB
3.3750 g | Freq: Four times a day (QID) | INTRAVENOUS | Status: DC
  Administered 2016-03-23: 3.375 g via INTRAVENOUS
  Filled 2016-03-23 (×3): qty 50

## 2016-03-23 MED ORDER — ONDANSETRON HCL 4 MG/2ML IJ SOLN: 4.0000 mg | Freq: Four times a day (QID) | INTRAMUSCULAR | Status: DC | PRN

## 2016-03-23 MED ORDER — PANTOPRAZOLE SODIUM 40 MG IV SOLR
40.0000 mg | Freq: Once | INTRAVENOUS | Status: AC
  Administered 2016-03-23: 40 mg via INTRAVENOUS
  Filled 2016-03-23: qty 40

## 2016-03-23 MED ORDER — SODIUM CHLORIDE 0.9 % IV SOLN
0.0000 ug/kg/h | INTRAVENOUS | Status: DC
  Administered 2016-03-23: 1.2 ug/kg/h via INTRAVENOUS
  Filled 2016-03-23 (×2): qty 2

## 2016-03-23 MED ORDER — NOREPINEPHRINE BITARTRATE 1 MG/ML IV SOLN
0.0000 ug/min | INTRAVENOUS | Status: DC
  Administered 2016-03-23: 5 ug/min via INTRAVENOUS
  Filled 2016-03-23: qty 4

## 2016-03-23 MED ORDER — VANCOMYCIN HCL 10 G IV SOLR
2000.0000 mg | Freq: Once | INTRAVENOUS | Status: AC
  Administered 2016-03-23: 2000 mg via INTRAVENOUS
  Filled 2016-03-23: qty 2000

## 2016-03-23 MED ORDER — INSULIN ASPART 100 UNIT/ML ~~LOC~~ SOLN: 1.0000 [IU] | SUBCUTANEOUS | Status: DC

## 2016-03-23 MED ORDER — MIDAZOLAM HCL 2 MG/2ML IJ SOLN
INTRAMUSCULAR | Status: AC
  Administered 2016-03-23: 2 mg via INTRAVENOUS
  Filled 2016-03-23: qty 2

## 2016-03-23 MED ORDER — SODIUM CHLORIDE 0.9 % IV SOLN
INTRAVENOUS | Status: DC | PRN
  Administered 2016-04-01 – 2016-04-02 (×2): via INTRA_ARTERIAL

## 2016-03-23 MED ORDER — MIDAZOLAM HCL 2 MG/2ML IJ SOLN
2.0000 mg | INTRAMUSCULAR | Status: DC | PRN
  Administered 2016-03-24 – 2016-04-02 (×15): 2 mg via INTRAVENOUS
  Filled 2016-03-23 (×11): qty 2

## 2016-03-23 MED ORDER — SODIUM BICARBONATE 8.4 % IV SOLN
100.0000 meq | Freq: Once | INTRAVENOUS | Status: AC
  Administered 2016-03-23: 100 meq via INTRAVENOUS

## 2016-03-23 MED ORDER — FENTANYL BOLUS VIA INFUSION
25.0000 ug | INTRAVENOUS | Status: DC | PRN
  Administered 2016-03-23 – 2016-03-29 (×8): 25 ug via INTRAVENOUS
  Filled 2016-03-23: qty 25

## 2016-03-23 MED ORDER — DEXMEDETOMIDINE HCL 200 MCG/2ML IV SOLN
0.4000 ug/kg/h | INTRAVENOUS | Status: DC
  Administered 2016-03-23: 0.6 ug/kg/h via INTRAVENOUS
  Filled 2016-03-23: qty 2

## 2016-03-23 MED ORDER — FENTANYL 2500MCG IN NS 250ML (10MCG/ML) PREMIX INFUSION
25.0000 ug/h | INTRAVENOUS | Status: DC
  Administered 2016-03-23: 200 ug/h via INTRAVENOUS
  Administered 2016-03-24: 400 ug/h via INTRAVENOUS
  Administered 2016-03-24 (×2): 300 ug/h via INTRAVENOUS
  Administered 2016-03-24 – 2016-03-26 (×6): 400 ug/h via INTRAVENOUS
  Administered 2016-03-26 – 2016-03-27 (×3): 300 ug/h via INTRAVENOUS
  Administered 2016-03-27: 200 ug/h via INTRAVENOUS
  Administered 2016-03-28: 250 ug/h via INTRAVENOUS
  Administered 2016-03-28: 225 ug/h via INTRAVENOUS
  Administered 2016-03-28: 400 ug/h via INTRAVENOUS
  Administered 2016-03-29: 250 ug/h via INTRAVENOUS
  Administered 2016-03-29: 300 ug/h via INTRAVENOUS
  Administered 2016-03-29: 250 ug/h via INTRAVENOUS
  Administered 2016-03-30: 400 ug/h via INTRAVENOUS
  Administered 2016-03-30: 250 ug/h via INTRAVENOUS
  Administered 2016-03-31 (×2): 200 ug/h via INTRAVENOUS
  Administered 2016-03-31 – 2016-04-02 (×3): 300 ug/h via INTRAVENOUS
  Administered 2016-04-02: 250 ug/h via INTRAVENOUS
  Administered 2016-04-02: 200 ug/h via INTRAVENOUS
  Administered 2016-04-02: 300 ug/h via INTRAVENOUS
  Administered 2016-04-03: 100 ug/h via INTRAVENOUS
  Administered 2016-04-03: 250 ug/h via INTRAVENOUS
  Filled 2016-03-23 (×32): qty 250

## 2016-03-23 MED ORDER — SODIUM CHLORIDE 0.9 % IV SOLN
0.0000 ug/min | INTRAVENOUS | Status: DC
  Administered 2016-03-23: 400 ug/min via INTRAVENOUS
  Administered 2016-03-24: 275 ug/min via INTRAVENOUS
  Administered 2016-03-24: 265 ug/min via INTRAVENOUS
  Administered 2016-03-24: 235 ug/min via INTRAVENOUS
  Administered 2016-03-24: 400 ug/min via INTRAVENOUS
  Administered 2016-03-24 (×4): 225 ug/min via INTRAVENOUS
  Administered 2016-03-25: 100 ug/min via INTRAVENOUS
  Administered 2016-03-25: 180 ug/min via INTRAVENOUS
  Administered 2016-03-25 (×2): 225 ug/min via INTRAVENOUS
  Filled 2016-03-23 (×15): qty 4

## 2016-03-23 MED ORDER — DEXTROSE 5 % IV SOLN
0.0000 ug/min | INTRAVENOUS | Status: DC
  Administered 2016-03-23: 40 ug/min via INTRAVENOUS
  Administered 2016-03-24: 25 ug/min via INTRAVENOUS
  Administered 2016-03-24 – 2016-03-25 (×2): 32 ug/min via INTRAVENOUS
  Administered 2016-03-25: 28 ug/min via INTRAVENOUS
  Administered 2016-03-25: 32 ug/min via INTRAVENOUS
  Administered 2016-03-26: 17 ug/min via INTRAVENOUS
  Administered 2016-03-27: 3 ug/min via INTRAVENOUS
  Filled 2016-03-23 (×10): qty 16

## 2016-03-23 MED ORDER — DEXTROSE 5 % IV SOLN
2.0000 g | Freq: Once | INTRAVENOUS | Status: AC
  Administered 2016-03-23: 2 g via INTRAVENOUS
  Filled 2016-03-23: qty 2

## 2016-03-23 MED ORDER — SODIUM CHLORIDE 0.9 % IV SOLN
80.0000 mg | Freq: Once | INTRAVENOUS | Status: AC
  Administered 2016-03-23: 18:00:00 80 mg via INTRAVENOUS
  Filled 2016-03-23: qty 80

## 2016-03-23 MED ORDER — VASOPRESSIN 20 UNIT/ML IV SOLN
0.0300 [IU]/min | INTRAVENOUS | Status: DC
  Administered 2016-03-23 – 2016-03-26 (×4): 0.03 [IU]/min via INTRAVENOUS
  Filled 2016-03-23 (×6): qty 2

## 2016-03-23 MED ORDER — PRISMASOL BGK 0/2.5 32-2.5 MEQ/L IV SOLN
INTRAVENOUS | Status: DC
  Filled 2016-03-23 (×7): qty 5000

## 2016-03-23 MED ORDER — SODIUM CHLORIDE 0.9 % IV SOLN
0.0000 ug/min | INTRAVENOUS | Status: DC
  Filled 2016-03-23: qty 1

## 2016-03-23 MED ORDER — SODIUM CHLORIDE 0.9 % IV SOLN: Freq: Once | INTRAVENOUS | Status: DC

## 2016-03-23 MED ORDER — ASPIRIN 81 MG PO CHEW: 324.0000 mg | CHEWABLE_TABLET | ORAL | Status: DC

## 2016-03-23 MED ORDER — PANTOPRAZOLE SODIUM 40 MG IV SOLR
40.0000 mg | Freq: Two times a day (BID) | INTRAVENOUS | Status: DC
  Administered 2016-03-27 – 2016-04-01 (×11): 40 mg via INTRAVENOUS
  Filled 2016-03-23 (×13): qty 40

## 2016-03-23 MED ORDER — SODIUM BICARBONATE 8.4 % IV SOLN
INTRAVENOUS | Status: DC
  Administered 2016-03-23 – 2016-03-25 (×4): via INTRAVENOUS
  Filled 2016-03-23 (×4): qty 150

## 2016-03-23 MED ORDER — SODIUM CHLORIDE 0.9 % IV SOLN
Freq: Once | INTRAVENOUS | Status: DC
  Administered 2016-03-23: 19:00:00 via INTRAVENOUS

## 2016-03-23 MED ORDER — MIDAZOLAM HCL 2 MG/2ML IJ SOLN
INTRAMUSCULAR | Status: AC
  Administered 2016-03-23: 2 mg
  Filled 2016-03-23: qty 2

## 2016-03-23 MED ORDER — SODIUM BICARBONATE 8.4 % IV SOLN
INTRAVENOUS | Status: AC
  Administered 2016-03-23: 100 meq via INTRAVENOUS
  Filled 2016-03-23: qty 100

## 2016-03-23 MED ORDER — SODIUM POLYSTYRENE SULFONATE 15 GM/60ML PO SUSP: 30.0000 g | Freq: Once | ORAL | Status: DC

## 2016-03-23 MED ORDER — SODIUM BICARBONATE 8.4 % IV SOLN
50.0000 meq | Freq: Once | INTRAVENOUS | Status: AC
  Administered 2016-03-23: 50 meq via INTRAVENOUS
  Filled 2016-03-23: qty 50

## 2016-03-23 MED ORDER — THIAMINE HCL 100 MG/ML IJ SOLN
100.0000 mg | Freq: Every day | INTRAMUSCULAR | Status: DC
  Administered 2016-03-24 – 2016-03-29 (×6): 100 mg via INTRAVENOUS
  Filled 2016-03-23 (×7): qty 1

## 2016-03-23 MED ORDER — FENTANYL CITRATE (PF) 100 MCG/2ML IJ SOLN
50.0000 ug | Freq: Once | INTRAMUSCULAR | Status: AC
  Administered 2016-03-23: 50 ug via INTRAVENOUS
  Filled 2016-03-23: qty 2

## 2016-03-23 MED ORDER — SODIUM CHLORIDE 0.9 % IV SOLN
Freq: Once | INTRAVENOUS | Status: DC
  Administered 2016-03-23: 20:00:00 via INTRAVENOUS

## 2016-03-23 MED ORDER — SODIUM CHLORIDE 0.9 % IV SOLN
INTRAVENOUS | Status: DC
  Administered 2016-03-23: 16:00:00 via INTRAVENOUS
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
## 2016-03-23 NOTE — Progress Notes (Signed)
eLink Physician-Brief Progress Note Patient Name: JAKEIM SEDORE DOB: June 05, 1960 MRN: [id number]   Date of Service  03/11/2016  HPI/Events of Note  I camerad into the pt's room and they were doing CPR.   He was emergently intubated.  He subsequently had ROSC.   Ongoing 1-2 u pRBC.  Got 2 amps bicarbonate as well as Calcium  130/50, 120, 16, 100% o2 sats  eICU Interventions  Cont blood transfusion. Will order 2 more units pRBC emergently just incase he bleeds more Stat labs now Stat CXR To start neo incase BP drops Will need TLC Cont bicarbonate drip      Intervention Category Major Interventions: Other:  Rush Landmark 03/16/2016, 7:21 PM

## 2016-03-23 NOTE — Progress Notes (Signed)
Pharmacy Antibiotic Note Jesse Hartman is a 56 y.o. male admitted on 03/19/2016 with concern for sepsis in setting of AKI.  Pharmacy has been consulted for Cefepime and vancomycin dosing.  Plan: 1. Vancomycin 1500 IV every 24 hours.  Goal trough 15-20 mcg/mL.  2. Cefepime 1 gram IV every 24 hours 3. Follow up renal function and empirically adjust antibiotics   Height: 6' (182.9 cm) Weight: 290 lb (131.5 kg) IBW/kg (Calculated) : 77.6  No data recorded.   Recent Labs Lab 03/16/2016 1318 03/08/2016 1328  WBC 19.1*  --   CREATININE 4.22*  --   LATICACIDVEN  --  11.03*    Estimated Creatinine Clearance: 28.1 mL/min (A) (by C-G formula based on SCr of 4.22 mg/dL (H)).    Allergies  Allergen Reactions  . Food Hives    Yogurt     Antimicrobials this admission: 3/19 Cefepime >>  3/19 vancomycin >>   Microbiology results: 3/19 BCx: px 3/19 UCx: px   Thank you for allowing pharmacy to be a part of this patient's care.  Vincenza Hews, PharmD, BCPS 03/14/2016, 2:45 PM

## 2016-03-23 NOTE — Progress Notes (Signed)
Evaluated patient  Still in shock and GI bleeding  Labs  Still  Hyperkalemic and Gap Metabolic Acidosis   Place dialysis catheter  Start CRRT for metabolic abnormalities but continue aggressive blood volume resuscitation

## 2016-03-23 NOTE — Consult Note (Addendum)
Consultation  Referring Provider:     ICU Dr Titus Mould Primary Care Physician:  Leamon Arnt, MD Primary Gastroenterologist:        Dr Scarlette Shorts Reason for Consultation:     GI bleed            HPI:   Jesse Hartman is a 56 y.o. male was brought into the hospital by his daughter after he was found down. He had altered mental status with acute kidney injury and acidosis. He had PEA arrest soon after presentation, intubated, with ROSC after 6 minutes . He has had 2 episodes of coffee-ground emesis and is putting out dark black fluid through the NG tube ~3L and also having melenic stool. Hemoglobin on admission 7.5 '@1'$  PM, most recent hemoglobin 5.4 '@7'$ :30 PM after rapid transfusion of 2 units packed RBC. He continues to be hemodynamically unstable on vasopressors. ICU team is placing an A-line.   Per Daughter he was started on diuretics last week by PMD for b/l lower leg swelling that was getting progressively worse in past 2 months. Started on Lasix and Potasium tablets. He got more confused over past 2 days and was found down in ?vomitus/feces by family.   Patient has history of chronic alcohol abuse, erosive esophagitis and Barrett's esophagus. He has missed his follow-up appointments . Patient was last seen by Dr. Henrene Pastor in March 2017 when he came for screening colonoscopy,  had 4 diminutive polyps removed and also had pancolonic diverticulosis Last EGD for Barrett's esophagus surveillance June 2015 showed evidence of Barrett's esophagus   Past Medical History:  Diagnosis Date  . Anxiety   . Arthritis   . Asthma   . Barrett's syndrome   . Blood transfusion without reported diagnosis   . Colon polyps   . COPD (chronic obstructive pulmonary disease) ([place])   . Depression   . Diverticulosis   . DVT (deep venous thrombosis) ([place])   . Esophagitis   . GERD (gastroesophageal reflux disease)   . Hiatal Hernia 06/22/2013  . Hypercalcemia   . Hypertension   . Hypokalemia   .  Hyponatremia   . Seizures ([place])    STATES HE THINKS THIS IS WRONG.- last seizure 12-2013  . Sleep apnea    YRS AGO---CAME OUT POSITIVE, BUT DOESN'T WEAR THE MASK  . Substance abuse   . 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 APPENDECTOMY    . BACK SURGERY     L1 L2  . BUNIONECTOMY    . ESOPHAGOGASTRODUODENOSCOPY  08/03/2011   Procedure: ESOPHAGOGASTRODUODENOSCOPY (EGD);  Surgeon: Milus Banister, MD;  Location: [facility] END[facility];  Service: Endoscopy;  Laterality: N/A;  . ESOPHAGOGASTRODUODENOSCOPY N/A 01/24/2013   Procedure: ESOPHAGOGASTRODUODENOSCOPY (EGD);  Surgeon: Lafayette Dragon, MD;  Location: [facility] ENDOSCOPY;  Service: Endoscopy;  Laterality: N/A;  . ESOPHAGOGASTRODUODENOSCOPY N/A 06/22/2013   Procedure: ESOPHAGOGASTRODUODENOSCOPY (EGD);  Surgeon: Gatha Mayer, MD;  Location: [facility] ENDOSCOPY;  Service: Endoscopy;  Laterality: N/A;  . FINGER SURGERY     lt middle  . HIP SURGERY    . NASAL SINUS SURGERY    . PERFORATED EARDRUM     RIGHT EAR - 2013  . RHINOPLASTY      Family History  Problem Relation Age of Onset  . Heart disease Father   . Diabetes Maternal Grandfather   . Colon cancer Neg Hx   . Colon polyps Neg Hx      Social History  Substance Use Topics  . Smoking status: Light Tobacco Smoker    Packs/day: 0.25    Years: 30.00    Types: Cigarettes    Last attempt to quit: 07/24/2014  . Smokeless tobacco: Never Used     Comment: occ smokes socially  . Alcohol use 16.8 oz/week    28 Cans of beer per week     Comment: ALCOHOL FREE FOR 7 MTHS   .Marland KitchenMarland KitchenWENT TO [facility] 02/2013 , HAD RELAPSE AND TRIED AGAIN.    Prior to Admission medications   Medication Sig Start Date End Date Taking? Authorizing Provider  albuterol (PROVENTIL HFA;VENTOLIN HFA) 108 (90 BASE) MCG/ACT inhaler Inhale 2 puffs into the lungs every 6 (six) hours as needed for wheezing or shortness of breath (copd).     Historical Provider, MD  albuterol (PROVENTIL)  (2.5 MG/3ML) 0.083% nebulizer solution Take 2.5 mg by nebulization every 6 (six) hours as needed for wheezing or shortness of breath.    Historical Provider, MD  amLODipine (NORVASC) 10 MG tablet Take 10 mg by mouth daily.    Historical Provider, MD  atorvastatin (LIPITOR) 20 MG tablet Take 20 mg by mouth daily.    Historical Provider, MD  budesonide-formoterol (SYMBICORT) 160-4.5 MCG/ACT inhaler Inhale 2 puffs into the lungs daily.    Historical Provider, MD  citalopram (CELEXA) 20 MG tablet Take 20 mg by mouth daily.    Historical Provider, MD  clonazePAM (KLONOPIN) 1 MG tablet Take 1 mg by mouth 3 (three) times daily as needed for anxiety.    Historical Provider, MD  cyclobenzaprine (FLEXERIL) 10 MG tablet Take 1 tablet (10 mg total) by mouth 2 (two) times daily as needed for muscle spasms. 10/11/13   Al Corpus, PA-C  esomeprazole (NEXIUM) 40 MG capsule Take 40 mg by mouth daily at 12 noon.    Historical Provider, MD  folic acid (FOLVITE) 1 MG tablet Take 1 tablet (1 mg total) by mouth daily. Patient not taking: Reported on 01/14/2016 12/13/13   Nishant Dhungel, MD  LORazepam (ATIVAN) 1 MG tablet Take 1 tablet (1 mg total) by mouth every 8 (eight) hours as needed for anxiety. Patient not taking: Reported on 01/14/2016 12/13/13   Nishant Dhungel, MD  metoprolol succinate (TOPROL-XL) 100 MG 24 hr tablet Take 100 mg by mouth daily. 06/27/15   Historical Provider, MD  Naproxen Sodium 220 MG CAPS Take 220 capsules by mouth 2 (two) times daily as needed (pain). Reported on 04/02/2015    Historical Provider, MD  oxyCODONE-acetaminophen (PERCOCET/ROXICET) 5-325 MG tablet Take 2 tablets by mouth every 4 (four) hours as needed for severe pain. 01/14/16   Kinnie Feil, PA-C  pantoprazole (PROTONIX) 40 MG tablet Take 1 tablet (40 mg total) by mouth 2 (two) times daily. Patient taking differently: Take 40 mg by mouth daily.  08/14/15   Irene Shipper, MD  thiamine 100 MG tablet Take 1 tablet (100 mg total) by  mouth daily. Patient not taking: Reported on 01/14/2016 12/13/13   Nishant Dhungel, MD  valsartan (DIOVAN) 320 MG tablet Take 320 mg by mouth daily.    Historical Provider, MD  zolpidem (AMBIEN) 10 MG tablet Take 10 mg by mouth at bedtime as needed for sleep.    Historical Provider, MD    Current Facility-Administered Medications  Medication Dose Route Frequency Provider Last Rate Last Dose  . 0.9 %  sodium chloride infusion   Intravenous Continuous Raylene Miyamoto, MD   Stopped at 03/30/2016 1729  . 0.9 %  sodium chloride infusion  250 mL Intravenous PRN Nelda Bucks, MD      . 0.9 %  sodium chloride infusion   Intravenous Once Nelda Bucks, MD      . 0.9 %  sodium chloride infusion   Intravenous Once Duayne Cal, NP      . 0.9 %  sodium chloride infusion   Intravenous Once [employer], MD      . 0.9 %  sodium chloride infusion   Intravenous Once [employer], MD      . chlorhexidine gluconate (MEDLINE KIT) (PERIDEX) 0.12 % solution 15 mL  15 mL Mouth Rinse BID Jose Angelo A de Dios, MD      . dexmedetomidine (PRECEDEX) 200 mcg in sodium chloride 0.9 % 50 mL infusion  0-1.2 mcg/kg/hr Intravenous Continuous Duayne Cal, NP 39.5 mL/hr at 03/15/2016 1944 1.2 mcg/kg/hr at 03/17/2016 1944  . fentaNYL (SUBLIMAZE) bolus via infusion 25 mcg  25 mcg Intravenous Q1H PRN Duayne Cal, NP      . fentaNYL in NS (62mcg/ml) infusion-PREMIX  25-400 mcg/hr Intravenous Continuous Duayne Cal, NP      . Melene Muller ON 03/24/2016] folic acid injection 1 mg  1 mg Intravenous Daily Nelda Bucks, MD      . heparin injection 1,000-6,000 Units  1,000-6,000 Units CRRT PRN Elvis Coil, MD      . Melene Muller ON 03/24/2016] MEDLINE mouth rinse  15 mL Mouth Rinse QID Jose Angelo A Christene Slates, MD      . midazolam (VERSED) 2 MG/2ML injection           . midazolam (VERSED) 2 MG/2ML injection           . midazolam (VERSED) injection 2 mg  2 mg Intravenous Once Duayne Cal, NP        . norepinephrine (LEVOPHED) 4 mg in dextrose 5 % 250 mL (0.016 mg/mL) infusion  0-40 mcg/min Intravenous Titrated Nelda Bucks, MD 18.8 mL/hr at 03/12/2016 1930 5 mcg/min at 03/27/2016 1930  . octreotide (SANDOSTATIN) 500 mcg in sodium chloride 0.9 % 250 mL (2 mcg/mL) infusion  50 mcg/hr Intravenous Continuous Duayne Cal, NP      . ondansetron [facility]) injection 4 mg  4 mg Intravenous Q6H PRN Nelda Bucks, MD      . pantoprazole (PROTONIX) 80 mg in sodium chloride 0.9 % 250 mL (0.32 mg/mL) infusion  8 mg/hr Intravenous Continuous Nelda Bucks, MD 25 mL/hr at 03/05/2016 1804 8 mg/hr at 03/16/2016 1804  . [START ON 03/27/2016] pantoprazole (PROTONIX) injection 40 mg  40 mg Intravenous Q12H Nelda Bucks, MD      . phenylephrine (NEO-SYNEPHRINE) 40 mg in sodium chloride 0.9 % 250 mL (0.16 mg/mL) infusion  0-400 mcg/min Intravenous Titrated Nelda Bucks, MD      . Melene Muller ON 03/24/2016] piperacillin-tazobactam (ZOSYN) IVPB 3.375 g  3.375 g Intravenous Q6H Earnie Larsson, RPH      . prismasol BGK 0/2.5 5,000 mL dialysis solution   CRRT Continuous Elvis Coil, MD      . sodium bicarbonate 150 mEq in dextrose 5 % 1,000 mL infusion   Intravenous Continuous Nelda Bucks, MD 50 mL/hr at 03/22/2016 1804    . sodium bicarbonate 150 mEq in sterile water 1,000 mL infusion   CRRT Continuous Elvis Coil, MD      . sodium bicarbonate 150 mEq in sterile water 1,000  mL infusion   CRRT Continuous Edrick Oh, MD      . sodium bicarbonate injection 50 mEq  50 mEq Intravenous Once [employer], MD      . sodium chloride 0.9 % primer fluid for CRRT   CRRT PRN Edrick Oh, MD      . sodium polystyrene (KAYEXALATE) 15 GM/60ML suspension 30 g  30 g Rectal Once Raylene Miyamoto, MD      . Derrill Memo ON 03/24/2016] thiamine (B-1) injection 100 mg  100 mg Intravenous Daily Raylene Miyamoto, MD      . Derrill Memo ON 03/24/2016] vancomycin (VANCOCIN) 1,500 mg in sodium chloride 0.9 % 500 mL IVPB  1,500  mg Intravenous Q24H Carmin Muskrat, MD        Allergies as of 03/28/2016 - Review Complete 03/11/2016  Allergen Reaction Noted  . Food Hives 10/11/2013     Review of Systems:    Unable to perform ROS      Physical Exam:  Vital signs in last 24 hours: Pulse Rate:  [25-123] 123 (03/19 1920) Resp:  [11-30] 17 (03/19 2000) BP: (67-151)/(35-109) 75/35 (03/19 2000) SpO2:  [96 %-100 %] 100 % (03/19 1920) FiO2 (%):  [70 %-100 %] 70 % (03/19 1946) Weight:  [290 lb (131.5 kg)] 290 lb (131.5 kg) (03/19 1317)   General:  Intubated, sedated, unresponsive Eyes:  anicteric. Lungs: Clear to auscultation bilaterally. Heart:  S1S2, no rubs, murmurs, gallops. Abdomen:  soft, non-tender, distended, no hepatosplenomegaly Extremities:   no edema Skin: no rash    Data Reviewed:   LAB RESULTS:  Recent Labs  03/27/2016 1318 03/21/2016 1936  WBC 19.1* 12.6*  HGB 7.5* 5.4*  HCT 21.8* 15.6*  PLT 251 188   BMET  Recent Labs  03/17/2016 1318 03/07/2016 1805  NA 121* 124*  K 6.2* 6.2*  CL 83* 93*  CO2 10* 14*  GLUCOSE 146* 144*  BUN 103* 111*  CREATININE 4.22* 4.04*  CALCIUM 8.3* 7.0*   LFT  Recent Labs  03/17/2016 1318  PROT 5.0*  ALBUMIN 2.4*  AST 93*  ALT 51  ALKPHOS 48  BILITOT 0.9   PT/INR  Recent Labs  04/02/2016 1318  LABPROT 16.8*  INR 1.35    STUDIES: Ct Abdomen Pelvis Wo Contrast  Result Date: 03/13/2016 CLINICAL DATA:  Coffee-ground emesis EXAM: CT ABDOMEN AND PELVIS WITHOUT CONTRAST TECHNIQUE: Multidetector CT imaging of the abdomen and pelvis was performed following the standard protocol without IV contrast. COMPARISON:  01/19/2013 FINDINGS: Lower chest: Lung bases are clear. No effusions. Heart is normal size. Hepatobiliary: Diffuse fatty infiltration of the liver. Node focal abnormality. Pancreas: Stranding noted around the pancreas compatible with acute pancreatitis. Spleen: No focal abnormality.  Normal size. Adrenals/Urinary Tract: No adrenal  abnormality. No focal renal abnormality. No stones or hydronephrosis. Urinary bladder is unremarkable. Stomach/Bowel: Scattered sigmoid diverticula. No active diverticulitis. Small bowel is decompressed. Stomach is distended with fluid. Vascular/Lymphatic: No evidence of aneurysm or adenopathy. Aortic calcifications. Reproductive: No visible focal abnormality. Other: No free fluid or free air. Small inguinal hernias containing fat. Musculoskeletal: No acute bony abnormality. Postoperative changes from posterior fusion at L4-5. Degenerative disc disease throughout the lumbar spine. IMPRESSION: Stranding around the pancreas compatible with acute pancreatitis. Fatty infiltration of the liver. Distended, fluid-filled stomach. Electronically Signed   By: Rolm Baptise M.D.   On: 03/31/2016 15:24   Ct Head Wo Contrast  Result Date: 03/28/2016 CLINICAL DATA:  Altered mental status.  Vomiting. EXAM: CT  HEAD WITHOUT CONTRAST TECHNIQUE: Contiguous axial images were obtained from the base of the skull through the vertex without intravenous contrast. COMPARISON:  01/14/2016 head CT . FINDINGS: Brain: No evidence of parenchymal hemorrhage or extra-axial fluid collection. No mass lesion, mass effect, or midline shift. No CT evidence of acute infarction. Generalized cerebral volume loss. No ventriculomegaly. Vascular: No hyperdense vessel or unexpected calcification. Skull: No evidence of calvarial fracture. Sinuses/Orbits: Mucoperiosteal thickening in the bilateral ethmoidal air cells. Layering secretions in the dependent left maxillary sinus and left nasopharynx. Other:  The mastoid air cells are unopacified. IMPRESSION: 1.  No evidence of acute intracranial abnormality. 2. Generalized cerebral volume loss. 3. Mild paranasal sinusitis of uncertain acuity . Electronically Signed   By: Ilona Sorrel M.D.   On: 03/05/2016 15:20   Dg Chest Port 1 View  Result Date: 03/22/2016 CLINICAL DATA:  Altered mental status. Shortness of  breath. History of hypertension, COPD and asthma. EXAM: PORTABLE CHEST 1 VIEW COMPARISON:  01/14/2016 and 08/23/2014. FINDINGS: 1321 hour. Low lung volumes with mild elevation of left hemidiaphragm. The heart size and mediastinal contours are stable. The lungs are clear. There is no pleural effusion or pneumothorax. No acute osseous findings are seen. Old left clavicle fracture is partially imaged. IMPRESSION: Suboptimal inspiration. No acute cardiopulmonary process identified. Electronically Signed   By: Richardean Sale M.D.   On: 03/29/2016 13:40     PREVIOUS ENDOSCOPIES:            Colonoscopy March 2017: Pancolonic diverticulosis. Removed 4 diminutive tubular adenomas  EGD September 2015: Evidence of Barrett's esophagus C8M8 with islands of squamous mucosa  EGD June 2015: Severe erosive esophagitis with multiple large ulcers in the entire esophagus  EGD January 2015: Severe esophagitis with multiple ulcers in the distal esophagus  EGD July 2013: Severe ulcerative esophagitis throughout the esophagus. Duodenitis  EGD June 2011: Long segment Barrett's esophagus 10 cm   Impression / Plan:   56 year old male with history of alcohol abuse, chronic smoker, severe erosive esophagitis, Barrett's esophagus brought in by family after he was found down with altered mental status. On admission noted to have severe acidosis, lactic acid 11.0, pH 7.2, ammonia 116, albumin 2.4, bilirubin 0.9. INR 1.35, Platelets 188 Patient had~3 L dark coffee-ground output from her NG tube Hemoglobin 7.5 on admission, was 12.8 in January 2018. Received rapid transfusion 2 units packed RBC, most recent Hgb 5.4 Hemodynamically unstable on 3 pressors (Vasopressin, Neo and Levophed)  AKI the setting of severe dehydration and alcohol abuse. GI hemorrhage also likely playing a role Cannot exclude cirrhosis based on labs with hypoalbuminemia and elevated INR. Continue Protonix and octreotide gtt Monitor hemoglobin and  transfuse as needed to maintain Hgb~ 7 Currently on broad-spectrum antibiotics Will plan for urgent EGD bedside in the ICU The risks and benefits as well as alternatives of endoscopic procedure(s) have been discussed and reviewed with his Daughter Adria. All questions answered. She agrees to proceed.     Damaris Hippo , MD [phone] Mon-Fri 8a-5p [phone] after 5p, weekends, holidays  @  03/16/2016, 8:47 PM

## 2016-03-23 NOTE — ED Notes (Signed)
ED Provider at bedside. 

## 2016-03-23 NOTE — ED Notes (Signed)
Pt's CBG result was 153. Informed Chrislyn - RN.

## 2016-03-23 NOTE — Progress Notes (Signed)
Pharmacy Antibiotic Note Jesse Hartman is a 56 y.o. male admitted on 03/05/2016 with concern for sepsis in setting of AKI.  Pharmacy has been consulted for Cefepime and vancomycin dosing.  Plan: 1. Vancomycin 1500 IV every 24 hours.  Goal trough 15-20 mcg/mL.  2. Cefepime 1 gram IV every 24 hours 3. Follow up renal function and empirically adjust antibiotics   Height: 6' (182.9 cm) Weight: 290 lb (131.5 kg) IBW/kg (Calculated) : 77.6  No data recorded.   Recent Labs Lab 03/16/2016 1318 03/11/2016 1328  WBC 19.1*  --   CREATININE 4.22*  --   LATICACIDVEN  --  11.03*    Estimated Creatinine Clearance: 28.1 mL/min (A) (by C-G formula based on SCr of 4.22 mg/dL (H)).    Allergies  Allergen Reactions  . Food Hives    Yogurt     Antimicrobials this admission: 3/19 Cefepime >>  3/19 vancomycin >>   Microbiology results: 3/19 BCx: px 3/19 UCx: px   Thank you for allowing pharmacy to be a part of this patient's care.  Vincenza Hews, PharmD, BCPS 03/14/2016, 4:58 PM   ______________________________________________ Update: Antibiotics were changed to zosyn for r/o intra-abdominal infectious source.   1. Zosyn 3.375gm IV every 8 hours (extended infusion) 2. Stop cefepime and vancomycin 3. Monitor renal function, clinical progress

## 2016-03-23 NOTE — Progress Notes (Signed)
While two RN's at bedside, patient began projectile vomiting coffee ground emesis and also had two tarry black stools. Pt with slurred speech and continued confusion and restlessness.  MD and NP notified. NG tube placed and 2L of bloody fluid removed.  Pt then began to heave and went into a bradycardic rhythm and then into PEA.  ACLS performed, see code sheets.

## 2016-03-23 NOTE — Progress Notes (Addendum)
eLink Physician-Brief Progress Note Patient Name: Jesse Hartman DOB: May 15, 1960 MRN: [id number]   Date of Service  03/25/2016  HPI/Events of Note  Pt vomited blood, bright red.  RN was concerned about airway so [facility] was called.   When I  saw him, he was more confused than baseline. For a while, I thought that he would lose his airway.  He eventually improved and was protecting his airway.   BP 105/70, PR 70, RR 20, o2 sats 100% on [place]  eICU Interventions  Hold off on intubation for now Observe respiratory status Place NGT Follow up on blood. >> pt has vomited 500 mls of blood this pm, more when he came in.  He is haemodynamically unstable.  Will emergently transfuse blood now.       Intervention Category Evaluation Type: Other  [place] 03/15/2016, 6:46 PM

## 2016-03-23 NOTE — ED Notes (Signed)
Xray at the bedside.

## 2016-03-23 NOTE — Procedures (Signed)
Central Venous Catheter Insertion Procedure Note Jesse Hartman [id number] Nov 10, 1960  Procedure: Insertion of Central Venous Catheter Indications: Assessment of intravascular volume, Drug and/or fluid administration and Frequent blood sampling  Procedure Details Consent: Unable to obtain consent because of emergent medical necessity. Time Out: Verified patient identification, verified procedure, site/side was marked, verified correct patient position, special equipment/implants available, medications/allergies/relevent history reviewed, required imaging and test results available.  Performed  Maximum sterile technique was used including antiseptics, cap, gloves, gown, hand hygiene, mask and sheet. Skin prep: Chlorhexidine; local anesthetic administered A antimicrobial bonded/coated triple lumen catheter was placed in the left internal jugular vein using the Seldinger technique.  Evaluation Blood flow good Complications: No apparent complications Patient did tolerate procedure well. Chest X-ray ordered to verify placement.  CXR: pending.  Hayden Pedro, AG-ACNP [facility]  Pgr: [phone]  PCCM Pgr: [phone]   Required [place] Emergent  Lavon Paganini. Titus Mould, MD, [place] Pgr: [facility]

## 2016-03-23 NOTE — ED Notes (Signed)
Admitting PA at the bedside.
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
## 2016-03-23 NOTE — H&P (Signed)
PULMONARY / CRITICAL CARE MEDICINE   Name: Jesse Hartman MRN: [id number] DOB: [birth year]    ADMISSION DATE:  03/28/2016 CONSULTATION DATE:  03/17/2016  REFERRING MD:  Dr. Vanita Panda  CHIEF COMPLAINT:  AMS  HISTORY OF PRESENT ILLNESS:   56 year old male with PMH as below, which includes ETOH abuse (24 pack per day), COPD, Barrett's esophagus, HTN, Seizures, and OSA. He was started on lasix and potassium supplementation approximately 3/12. He was found down by his daughter 3/19 and brought to ED via EMS. She first noticed him to be acting strange 3/17 when he was slurring his words and speaking gibberish on the telephone. Then 3/19 she tried to call him on the phone again and he did not answer so she went to his house and found him lying in his own stool/vomit, which she described as bloody. Upon arrival to ED he was hypotensive, which was slow to respond to IVF bolus. He was able to follow commands, but was confused. Laboratory evaluation significant for serum creatinine 4.2, K 6.2, Sodium 121, BUN 103, Co2 12.4,  and Lactic acid 11. Imaging of head and abd/pelvis unrevealing. While in CT he had episode of projectile vomiting of coffee ground emesis. Despite IVF resuscitation he remained hypotensive and PCCM called to admit.   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  PAST SURGICAL HISTORY: He  has a past surgical history that includes Rhinoplasty; Bunionectomy; Appendectomy; Esophagogastroduodenoscopy (08/03/2011); Finger surgery;  Esophagogastroduodenoscopy (N/A, 01/24/2013); Nasal sinus surgery; Back surgery; Esophagogastroduodenoscopy (N/A, 06/22/2013); PERFORATED EARDRUM; and Hip surgery.  Allergies  Allergen Reactions  . Food Hives    Yogurt     No current facility-administered medications on file prior to encounter.    Current Outpatient Prescriptions on File Prior to Encounter  Medication Sig  . albuterol (PROVENTIL HFA;VENTOLIN HFA) 108 (90 BASE) MCG/ACT inhaler Inhale 2 puffs into the lungs every 6 (six) hours as needed for wheezing or shortness of breath (copd).   Marland Kitchen albuterol (PROVENTIL) (2.5 MG/3ML) 0.083% nebulizer solution Take 2.5 mg by nebulization every 6 (six) hours as needed for wheezing or shortness of breath.  Marland Kitchen amLODipine (NORVASC) 10 MG tablet Take 10 mg by mouth daily.  Marland Kitchen atorvastatin (LIPITOR) 20 MG tablet Take 20 mg by mouth daily.  . budesonide-formoterol (SYMBICORT) 160-4.5 MCG/ACT inhaler Inhale 2 puffs into the lungs daily.  . citalopram (CELEXA) 20 MG tablet Take 20 mg by mouth daily.  . clonazePAM (KLONOPIN) 1 MG tablet Take 1 mg by mouth 3 (three) times daily as needed for anxiety.  . cyclobenzaprine (FLEXERIL) 10 MG tablet Take 1 tablet (10 mg total) by mouth 2 (two) times daily as needed for muscle spasms.  Marland Kitchen esomeprazole (NEXIUM) 40 MG capsule Take 40 mg by mouth daily at 12 noon.  . folic acid (FOLVITE) 1 MG tablet Take 1 tablet (1 mg total) by mouth daily. (Patient not taking: Reported on 01/14/2016)  . LORazepam (ATIVAN) 1 MG tablet Take 1 tablet (1 mg total) by mouth every 8 (eight) hours as needed for anxiety. (Patient not taking: Reported on 01/14/2016)  . metoprolol succinate (TOPROL-XL) 100 MG 24 hr tablet Take 100 mg by mouth daily.  Marland Kitchen  Naproxen Sodium 220 MG CAPS Take 220 capsules by mouth 2 (two) times daily as needed (pain). Reported on 04/02/2015  . oxyCODONE-acetaminophen (PERCOCET/ROXICET) 5-325 MG tablet Take 2 tablets by mouth every 4 (four) hours as needed for severe pain.   . pantoprazole (PROTONIX) 40 MG tablet Take 1 tablet (40 mg total) by mouth 2 (two) times daily. (Patient taking differently: Take 40 mg by mouth daily. )  . thiamine 100 MG tablet Take 1 tablet (100 mg total) by mouth daily. (Patient not taking: Reported on 01/14/2016)  . valsartan (DIOVAN) 320 MG tablet Take 320 mg by mouth daily.  Marland Kitchen zolpidem (AMBIEN) 10 MG tablet Take 10 mg by mouth at bedtime as needed for sleep.    FAMILY HISTORY:  His indicated that his father is deceased. He indicated that the status of his maternal grandfather is unknown. He indicated that the status of his neg hx is unknown.    SOCIAL HISTORY: He  reports that he has been smoking Cigarettes.  He has a 7.50 pack-year smoking history. He has never used smokeless tobacco. He reports that he drinks about 16.8 oz of alcohol per week . He reports that he uses drugs.  REVIEW OF SYSTEMS:   Unable due to encephalopathy  SUBJECTIVE:  Awake talking full paragraphs   VITAL SIGNS: BP (!) 68/55   Pulse 90   Resp 18   Ht 6' (1.829 m)   Wt 131.5 kg (290 lb)   SpO2 100%   BMI 39.33 kg/m   HEMODYNAMICS:    VENTILATOR SETTINGS:    INTAKE / OUTPUT: No intake/output data recorded.  PHYSICAL EXAMINATION: General:  Obese male agitated Neuro:  Agitated, does somewhat follow commands and is oriented to person and place.  HEENT:  [place]/AT, PERRL, no JVD. Not tracking with eyes Cardiovascular:  RRR, no MRG Lungs:  Clear bilateral breath sounds Abdomen:  Soft, non-tender, non-distended Musculoskeletal:  No acute deformity or ROM limitation Skin:  Pallor, otherwise intact  LABS:  BMET  Recent Labs Lab 03/30/2016 1318  NA 121*  K 6.2*  CL 83*  CO2 10*  BUN 103*  CREATININE 4.22*  GLUCOSE 146*    Electrolytes  Recent Labs Lab 03/08/2016 1318  CALCIUM 8.3*    CBC  Recent Labs Lab 04/01/2016 1318  WBC 19.1*  HGB 7.5*  HCT 21.8*  PLT 251    Coag's  Recent Labs Lab 03/12/2016 1318  INR 1.35     Sepsis Markers  Recent Labs Lab 03/20/2016 1328  LATICACIDVEN 11.03*    ABG No results for input(s): PHART, PCO2ART, PO2ART in the last 168 hours.  Liver Enzymes  Recent Labs Lab 03/18/2016 1318  AST 93*  ALT 51  ALKPHOS 48  BILITOT 0.9  ALBUMIN 2.4*    Cardiac Enzymes No results for input(s): TROPONINI, PROBNP in the last 168 hours.  Glucose  Recent Labs Lab 03/10/2016 1319  GLUCAP 153*    Imaging Ct Abdomen Pelvis Wo Contrast  Result Date: 03/20/2016 CLINICAL DATA:  Coffee-ground emesis EXAM: CT ABDOMEN AND PELVIS WITHOUT CONTRAST TECHNIQUE: Multidetector CT imaging of the abdomen and pelvis was performed following the standard protocol without IV contrast. COMPARISON:  01/19/2013 FINDINGS: Lower chest: Lung bases are clear. No effusions. Heart is normal size. Hepatobiliary: Diffuse fatty infiltration of the liver. Node focal abnormality. Pancreas: Stranding noted around the pancreas compatible with acute pancreatitis. Spleen: No focal abnormality.  Normal size. Adrenals/Urinary Tract: No adrenal abnormality. No focal renal abnormality. No stones or hydronephrosis. Urinary  bladder is unremarkable. Stomach/Bowel: Scattered sigmoid diverticula. No active diverticulitis. Small bowel is decompressed. Stomach is distended with fluid. Vascular/Lymphatic: No evidence of aneurysm or adenopathy. Aortic calcifications. Reproductive: No visible focal abnormality. Other: No free fluid or free air. Small inguinal hernias containing fat. Musculoskeletal: No acute bony abnormality. Postoperative changes from posterior fusion at L4-5. Degenerative disc disease throughout the lumbar spine. IMPRESSION: Stranding around the pancreas compatible with acute pancreatitis. Fatty infiltration of the liver. Distended, fluid-filled stomach. Electronically Signed   By: Rolm Baptise M.D.   On: 03/31/2016 15:24   Ct Head Wo Contrast  Result Date: 03/11/2016 CLINICAL DATA:  Altered mental status.   Vomiting. EXAM: CT HEAD WITHOUT CONTRAST TECHNIQUE: Contiguous axial images were obtained from the base of the skull through the vertex without intravenous contrast. COMPARISON:  01/14/2016 head CT . FINDINGS: Brain: No evidence of parenchymal hemorrhage or extra-axial fluid collection. No mass lesion, mass effect, or midline shift. No CT evidence of acute infarction. Generalized cerebral volume loss. No ventriculomegaly. Vascular: No hyperdense vessel or unexpected calcification. Skull: No evidence of calvarial fracture. Sinuses/Orbits: Mucoperiosteal thickening in the bilateral ethmoidal air cells. Layering secretions in the dependent left maxillary sinus and left nasopharynx. Other:  The mastoid air cells are unopacified. IMPRESSION: 1.  No evidence of acute intracranial abnormality. 2. Generalized cerebral volume loss. 3. Mild paranasal sinusitis of uncertain acuity . Electronically Signed   By: Ilona Sorrel M.D.   On: 03/31/2016 15:20   Dg Chest Port 1 View  Result Date: 03/15/2016 CLINICAL DATA:  Altered mental status. Shortness of breath. History of hypertension, COPD and asthma. EXAM: PORTABLE CHEST 1 VIEW COMPARISON:  01/14/2016 and 08/23/2014. FINDINGS: 1321 hour. Low lung volumes with mild elevation of left hemidiaphragm. The heart size and mediastinal contours are stable. The lungs are clear. There is no pleural effusion or pneumothorax. No acute osseous findings are seen. Old left clavicle fracture is partially imaged. IMPRESSION: Suboptimal inspiration. No acute cardiopulmonary process identified. Electronically Signed   By: Richardean Sale M.D.   On: 04/02/2016 13:40     STUDIES:  CT head 3/19 > No evidence of acute intracranial abnormality. Generalized cerebral volume loss. Mild paranasal sinusitis of uncertain acuity . CT abd/pelv 3/19 > Stranding around the pancreas compatible with acute pancreatitis. Fatty infiltration of the liver. Distended, fluid-filled stomach.  CULTURES: Blood  3/19 > Urine 3/19 >  ANTIBIOTICS: Zosyn 3/19 >  SIGNIFICANT EVENTS: 3/19 admit, ARF  LINES/TUBES:  ASSESSMENT / PLAN:  PULMONARY A: Ask risk for poor airway protection COPD without acute exacerbation OSA  P:   Supplemental O2 as needed to keep Sats > 92% PRN bronchodilators   CARDIOVASCULAR A:  Hypotension/shock: likely hypovolemic, slowly responding to volume   P:  Telemetry monitoring Aggressive IVF May require CVL pressors, but is likely very dry.  Continue to trend lactic acid  RENAL A:   Acute renal failure Hyperkalemia in setting AKI and recent supplementation: ECG with peaked T AG acidosis secondary to lactic/renal failure Hyponatremia dehydration / beer potomania  P:   Aggressive IVF Bicarb infusion Nephrology following BMP q 4  GASTROINTESTINAL A:   Acute pancreatitis Likely GIB H/o Barrett's esophagus  P:   NPO Blood products per heme/onc below Protonix infusion Will need GI consult once he settles out.   HEMATOLOGIC A:   ABLA secondary to likely GIB  P:  Serial CBC Transfuse one unit PRBC  INFECTIOUS A:   Acute pancreatitis Need to rule out intraabdominal infection P:  Zosyn Follow cultures  ENDOCRINE A:   No acute issues  P:   Follow  NEUROLOGIC A:   Acute metabolic encephalopathy: multifactorial - withdrawal, uremia, hepatic P:   RASS goal: 0 May need Precedex for agitation until underlying issue resolved, also may need to be intubated if agitation interferes with care or poses risk to himself.    FAMILY  - Updates: Daugther updated 3/19 in ED  - Inter-disciplinary family meet or Palliative Care meeting due by: 3/25   Georgann Housekeeper, AGACNP-BC [place] [facility] Pager [phone] or [phone]  03/05/2016 4:52 PM   STAFF NOTE: Linwood Dibbles, MD FACP have personally reviewed patient's available data, including medical history, events of note, physical examination and test results  as part of my evaluation. I have discussed with resident/NP and other care providers such as pharmacist, RN and RRT. In addition, I personally evaluated patient and elicited key findings of: awake, follows commands, confused, lungs clear, agitation intermittent, abdo distended, tender mild, soft, no r/g, no edema, no jvd, appears dry skin, clinically presenting with pancreatitis, hypovolemia, etoh WD, ARF with mild hyperkalemia secondary to dehydration, ATN rhabdo? And ARB, admit to ICU, repeat ABG, pcxr in am, remains with normal BP, lactic elevated secondary to hypovolemia, pancreatitis / SIRS, needs significant volume resus with saline, (hyponatremia - hypovolemic), assess serum osm, urine, osm, add bicarb for K and follow closely for bmet, also r/o GI bleed, r/o stress gastritis, add ppi drip, may need NGT , give 1 unit prbc, follow cbc closely, renal following, hope k will resolve with medical care, kay x 1, NO roel lactulose for now but may need this in am after volume status improved, get ldh, lipase, will ensure [facility] sent and use mono zosyn, get amylase, may need [place] to correlate with CT pancrease, if declines will need repeat CT abdo with ORAL contrast only, will get repeat ABG, NO NIMV with asp risk, scd only, inr wnl, no ffp needed, use thiamine, folic acid, may need precedex if worsen, if agitation worsens and ph WNL, may need to add benzo to avoid benzo WD, I updated daughter at bedside in full, does not require ETT at this time, follow clinically and ABG   The patient is critically ill with multiple organ systems failure and requires high complexity decision making for assessment and support, frequent evaluation and titration of therapies, application of advanced monitoring technologies and extensive interpretation of multiple databases.   Critical Care Time devoted to patient care services described in this note is 40 Minutes. This time reflects time of care of this signee: Merrie Roof, MD  FACP. This critical care time does not reflect procedure time, or teaching time or supervisory time of PA/NP/Med student/Med Resident etc but could involve care discussion time. Rest per NP/medical resident whose note is outlined above and that I agree with   Lavon Paganini. Titus Mould, MD, [place] Pgr: [facility] 03/08/2016 4:55 PM

## 2016-03-23 NOTE — Progress Notes (Signed)
eLink Physician-Brief Progress Note Patient Name: Jesse Hartman DOB: April 03, 1960 MRN: [id number]   Date of Service  03/10/2016  HPI/Events of Note  Pt seen by PCCM service this pm. Alcoholic with pancreatitis, Barrett's esophagus, acute kidney injury, hyperkalemia, hepatic encephalopathy, lactic acidosis, hepatic encephalopathy, coffee-ground emesis/upper GI bleed.   Patient seen, confused, protecting his airway, moaning. Agitated.  BP fluctuating 50-120/40-110 depending on cuff, HR 90s, RR 16, 100% on RA  On his third liter saline bolus. He has a fourth liter bolus ordered. They will start bicarbonate drip. One unit packed red blood cell has been ordered. Foley catheter has been inserted. No further bleeding noted.   eICU Interventions  Observe resp status for now.  May need to be intubated. precedex drip if bp allows.  Check lytes at 7 pm. May be difficult to give kayexalate 2/2 UGIB Cont other meds     Intervention Category Evaluation Type: New Patient Evaluation  Rush Landmark 03/10/2016, 6:07 PM

## 2016-03-23 NOTE — ED Notes (Signed)
Pt has intermittent episodes of agitation, continues to speak, most of which I cant piece together to understand.  Will continue to monitor
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
## 2016-03-23 NOTE — Op Note (Addendum)
[facility] Patient Name: Jesse Hartman Procedure Date : 03/26/2016 MRN: [id number] Attending MD: Mauri Pole , MD Date of Birth: Jan 13, 1960 CSN: [id number] Age: 56 Admit Type: Inpatient Procedure:                Upper GI endoscopy Indications:              Suspected upper gastrointestinal bleeding Providers:                Mauri Pole, MD, Hilma Favors, RN, Corliss Parish, Technician Referring MD:              Medicines:                Patient sedated by ICU team on fentanyl gtt, Ativan                            bolus prn and Precedex. Complications:            No immediate complications. Estimated Blood Loss:     Estimated blood loss was minimal. Procedure:                Pre-Anesthesia Assessment:                           - Prior to the procedure, a History and Physical                            was performed, and patient medications and                            allergies were reviewed. The patient's tolerance of                            previous anesthesia was also reviewed. The risks                            and benefits of the procedure and the sedation                            options and risks were discussed with the patient.                            All questions were answered, and informed consent                            was obtained. Prior Anticoagulants: The patient has                            taken no previous anticoagulant or antiplatelet                            agents. ASA Grade Assessment: II - A patient with  mild systemic disease. After reviewing the risks                            and benefits, the patient was deemed in                            satisfactory condition to undergo the procedure.                           After obtaining informed consent, the endoscope was                            passed under direct vision. Throughout the       procedure, the patient's blood pressure, pulse, and                            oxygen saturations were monitored continuously. The                            EG-[id number] ([id number]) scope was introduced through the                            mouth, and advanced to the second part of duodenum.                            The upper GI endoscopy was accomplished without                            difficulty. The patient tolerated the procedure                            well. Scope In: Scope Out: Findings:      Severe esophagitis with diffuse ulceration of the mucosa was found 25 to       44 cm from the incisors. Noted small clots in the distal esophagus       removed with lavage and suction, no active bleeding or oozing noted       after removal of clots. No evidence of esophageal varices . Due to       severe esophagitis and ulcerated mucosa cannot dileneate the extent of       Barrett's esophagus. Patulous EG junction with free reflux.      A medium amount of food (residue) and old blood was found in the gastric       fundus. No gastric ulcers or varices      The examined duodenum was normal. Impression:               - Severe esophagitis with multiple ulcers in the                            distal and mid esophagus.                           - A medium amount of food (residue) and old blood  in the stomach with no varices or gastric ulcers.                           - Normal examined duodenum.                           - No specimens collected. Moderate Sedation:      N/A Recommendation:           - Patient has a contact number available for                            emergencies. The signs and symptoms of potential                            delayed complications were discussed with the                            patient. Return to normal activities tomorrow.                            Written discharge instructions were provided to the                             patient.                           - NPO for today                           - Clears tomorrow and advance as tolerated                           - Continue present medications.                           - Continue PPI                           - Monitor Hgb and transfuse as needed to maintain                            Hgb 7                           - Inpatient GI team will follow up in the AM Procedure Code(s):        --- Professional ---                           [phone], Esophagogastroduodenoscopy, flexible,                            transoral; diagnostic, including collection of                            specimen(s) by brushing or washing, when performed                            (  separate procedure) Diagnosis Code(s):        --- Professional ---                           K20.9, Esophagitis, unspecified CPT copyright 2016 American Medical Association. All rights reserved. The codes documented in this report are preliminary and upon coder review may  be revised to meet current compliance requirements. Mauri Pole, MD 04/02/2016 11:47:52 PM This report has been signed electronically. Number of Addenda: 0

## 2016-03-23 NOTE — ED Triage Notes (Signed)
Per [facility], Pt is coming from home where he was found sitting on the toilet by his daughter. LSN two days ago. EMS reported bowel that noted to be in his vomit. Pt is confused upon arrival to the ED. Complains of pain in his shoulder and rib cage. Wheezing noted in his lungs bilaterally. Vitals per EMS: CBG 141, 116/78, 95 HR, 100% on NRB. 12 Lead EKG NSR   Given 5 mg of Albuterol, 1 mg of Atrovent.

## 2016-03-23 NOTE — Procedures (Signed)
Arterial Catheter Insertion Procedure Note AVIN GIBBONS [id number] December 30, 1960  Procedure: Insertion of Arterial Catheter  Indications: Blood pressure monitoring  Procedure Details Consent: Unable to obtain consent because of emergent medical necessity. Time Out: Verified patient identification, verified procedure, site/side was marked, verified correct patient position, special equipment/implants available, medications/allergies/relevent history reviewed, required imaging and test results available.  Performed  Maximum sterile technique was used including antiseptics, cap, gloves, gown, hand hygiene, mask and sheet. Skin prep: Chlorhexidine; local anesthetic administered 20 gauge catheter was inserted into right radial artery using the Seldinger technique.  Evaluation Blood flow good; BP tracing good. Complications: No apparent complications.   Marlowe Aschoff 03/15/2016

## 2016-03-23 NOTE — Progress Notes (Signed)
PCCM INTERVAL PROGRESS NOTE  56 year old male admitted earlier this afternoon with AMS, AKI, hyperkalemia, and coffee ground emesis. Experienced one episode of coffee ground emesis to which I responded to bedside. He maintained good respiratory status, good O2 sats, and remained hemodynamically stable. Mental status was similar to what it was when he was examined in ED. NGT was placed by me and emergent release blood was ordered. Discussed case with Dr. Titus Mould and agreed that we would transfuse, closely monitor hemoglobin and respiratory status with STAT ABG. Shortly after, while his daughters were in room visiting he had sudden LOC and became bradycardic ultimately suffering PEA arrest. Arrest lasted approximately 6 minutes. Airway was secured. After ROSC emergent release blood continued, levophed started.   Acute hypoxemic respiratory failure secondary to likely aspiration of emesis - Full vent support (8cc/Kg, rate 30, PEEP 8, and 100% FiO2) - CXR stat - ABG stat - VAP bundle - Continue Zosyn per pharmacy consultation  Hemorrhagic shock in setting of likely GI bleed - Emergent release blood 3 units - Levophed for MAP goal 50-60 mmHg - Trend lactic acid (clearing) - Will place CVL  GI bleed: ETOH abuse history, no known history of cirrhosis or varices - Trend CBC - Transfuse as above - Protonix infusion - Will seek GI consultation > recommending octreotide infusion  Hyperkalemia - Calcium - Insulin - Bicarb pushes and gtt continue  AKI - renal following, may need RRT  Georgann Housekeeper, AGACNP-BC [facility] Pulmonology/Critical Care  Pager [phone] or [phone]  03/14/2016 7:18 PM  Agree with above Upon transfer from ER. We had provided blood, bicarb, ABX, PPI drip aggressive care. PH improved, Mental status improved, resp status improved.  Despite he then abruptly declined to brady arrhythmia and arrest, successfully resuscitated. Gi involved, stat prbc, bicarb  etc IMproved  Lavon Paganini. Titus Mould, MD, [place] Pgr: [facility]

## 2016-03-23 NOTE — Progress Notes (Signed)
  Patient Name: Jesse Hartman   MRN: [id number]   Date of Birth/ Sex: [birth year] , male      Admission Date: 03/11/2016  Attending Provider: Raylene Miyamoto, MD  Primary Diagnosis: <principal problem not specified>   Indication: Pt was in his usual state of health until this PM, when he was noted to be in PEA arrest.. Code blue was subsequently called. At the time of arrival on scene, ACLS protocol was underway.  The patient was brought to ED via EMS today after Daughter found him unresponsive in a pool of bloody emesis and bowel movement. He was transferred to ICU where he had an other large bloody bowel movement and on placing NG tube, drained 2L of blood. Later he suddenly lost his consciousness and became bradycardic initially and then had PEA arrest.ROSC was achieved in approximately 6-7 minutes. He was given emergent blood transfusions and started on Levophed. He was intubated because of acute hypoxemic respiratory failure secondary to likely aspiration of emesis.  Technical Description:  - CPR performance duration:  6-7  minutes  - Was defibrillation or cardioversion used? No   - Was external pacer placed? No  - Was patient intubated pre/post CPR? Yes   Medications Administered: Y = Yes; Blank = No Amiodarone    Atropine    Calcium  y  Epinephrine  y  Lidocaine    Magnesium    Norepinephrine    Phenylephrine    Sodium bicarbonate  y  Vasopressin     Post CPR evaluation:  - Final Status - Was patient successfully resuscitated ? Yes - What is current rhythm? Sinus tachycardia - What is current hemodynamic status? On pressors.    Miscellaneous Information:  - Labs sent, including: ABGs,CBC, BMP  - Primary team notified?  Yes  - Family Notified? Yes  - Additional notes/ transfer status: Patient remained in ICU.     Lorella Nimrod, MD  04/01/2016, 8:01 PM

## 2016-03-23 NOTE — Progress Notes (Signed)
Changes per NP and ABG results.  Rt will monitor.

## 2016-03-23 NOTE — ED Notes (Signed)
Pt had a episode of projectile vomiting a large amount of coffee ground emesis in CT.  Per previous shift the vomit came out with such force that it hit the window separating the CT room from the CT tech workroom.  Pt washed up and removed all dried emesis from arms, chest, face and from in the beard.  Pt had additional episodes of nausea with "dry heaves" resulting in some thick jelly like coffee ground colored substance coming from his nose.  Pt sat up to avoid aspiration.  Pt is rambling unintelligibly.  At one point when asked he does tell me that he is at [facility] [place] and another time that he is on 3A.   Pt placed in a new gown and on a new stretcher with clean sheets.  Daughter is at the bedside.

## 2016-03-23 NOTE — ED Notes (Signed)
MD Lockwood at the bedside  

## 2016-03-24 ENCOUNTER — Inpatient Hospital Stay (HOSPITAL_COMMUNITY): Payer: Medicare Other

## 2016-03-24 ENCOUNTER — Encounter (HOSPITAL_COMMUNITY): Payer: Self-pay | Admitting: Physician Assistant

## 2016-03-24 DIAGNOSIS — J9601 Acute respiratory failure with hypoxia: Secondary | ICD-10-CM

## 2016-03-24 DIAGNOSIS — K210 Gastro-esophageal reflux disease with esophagitis: Secondary | ICD-10-CM

## 2016-03-24 DIAGNOSIS — F1010 Alcohol abuse, uncomplicated: Secondary | ICD-10-CM

## 2016-03-24 DIAGNOSIS — K920 Hematemesis: Secondary | ICD-10-CM

## 2016-03-24 DIAGNOSIS — K2901 Acute gastritis with bleeding: Secondary | ICD-10-CM

## 2016-03-24 DIAGNOSIS — D62 Acute posthemorrhagic anemia: Secondary | ICD-10-CM

## 2016-03-24 LAB — POCT I-STAT 3, ART BLOOD GAS (G3+)
ACID-BASE DEFICIT: 7 mmol/L — AB (ref 0.0–2.0)
ACID-BASE DEFICIT: 2 mmol/L (ref 0.0–2.0)
Bicarbonate: 21 mmol/L (ref 20.0–28.0)
Bicarbonate: 24.8 mmol/L (ref 20.0–28.0)
O2 SAT: 96 %
O2 Saturation: 94 %
PH ART: 7.291 — AB (ref 7.350–7.450)
TCO2: 23 mmol/L (ref 0–100)
TCO2: 26 mmol/L (ref 0–100)
pCO2 arterial: 50.9 mmHg — ABNORMAL HIGH (ref 32.0–48.0)
pCO2 arterial: 51.6 mmHg — ABNORMAL HIGH (ref 32.0–48.0)
pH, Arterial: 7.224 — ABNORMAL LOW (ref 7.350–7.450)
pO2, Arterial: 95 mmHg (ref 83.0–108.0)
pO2, Arterial: 81 mmHg — ABNORMAL LOW (ref 83.0–108.0)

## 2016-03-24 LAB — BASIC METABOLIC PANEL
Anion gap: 11 (ref 5–15)
Anion gap: 12 (ref 5–15)
Anion gap: 13 (ref 5–15)
BUN: 101 mg/dL — ABNORMAL HIGH (ref 6–20)
BUN: 109 mg/dL — ABNORMAL HIGH (ref 6–20)
BUN: 97 mg/dL — AB (ref 6–20)
CALCIUM: 7.2 mg/dL — AB (ref 8.9–10.3)
CALCIUM: 6.9 mg/dL — AB (ref 8.9–10.3)
CHLORIDE: 96 mmol/L — AB (ref 101–111)
CHLORIDE: 96 mmol/L — AB (ref 101–111)
CO2: 28 mmol/L (ref 22–32)
CO2: 20 mmol/L — AB (ref 22–32)
CO2: 27 mmol/L (ref 22–32)
CREATININE: 2.98 mg/dL — AB (ref 0.61–1.24)
CREATININE: 3.56 mg/dL — AB (ref 0.61–1.24)
CREATININE: 3.21 mg/dL — AB (ref 0.61–1.24)
Calcium: 7 mg/dL — ABNORMAL LOW (ref 8.9–10.3)
Chloride: 94 mmol/L — ABNORMAL LOW (ref 101–111)
GFR calc Af Amer: 21 mL/min — ABNORMAL LOW (ref >60)
GFR calc Af Amer: 26 mL/min — ABNORMAL LOW (ref >60)
GFR calc non Af Amer: 18 mL/min — ABNORMAL LOW (ref >60)
GFR calc non Af Amer: 20 mL/min — ABNORMAL LOW (ref >60)
GFR calc non Af Amer: 22 mL/min — ABNORMAL LOW (ref >60)
GFR, EST AFRICAN AMERICAN: 24 mL/min — AB (ref >60)
GLUCOSE: 179 mg/dL — AB (ref 65–99)
Glucose, Bld: 261 mg/dL — ABNORMAL HIGH (ref 65–99)
Glucose, Bld: 263 mg/dL — ABNORMAL HIGH (ref 65–99)
POTASSIUM: 3.5 mmol/L (ref 3.5–5.1)
Potassium: 3.7 mmol/L (ref 3.5–5.1)
Potassium: 4.3 mmol/L (ref 3.5–5.1)
SODIUM: 137 mmol/L (ref 135–145)
SODIUM: 127 mmol/L — AB (ref 135–145)
Sodium: 133 mmol/L — ABNORMAL LOW (ref 135–145)

## 2016-03-24 LAB — GLUCOSE, CAPILLARY
GLUCOSE-CAPILLARY: 139 mg/dL — AB (ref 65–99)
GLUCOSE-CAPILLARY: 160 mg/dL — AB (ref 65–99)
GLUCOSE-CAPILLARY: 161 mg/dL — AB (ref 65–99)
GLUCOSE-CAPILLARY: 187 mg/dL — AB (ref 65–99)
GLUCOSE-CAPILLARY: 240 mg/dL — AB (ref 65–99)
GLUCOSE-CAPILLARY: 259 mg/dL — AB (ref 65–99)
GLUCOSE-CAPILLARY: 285 mg/dL — AB (ref 65–99)
Glucose-Capillary: 249 mg/dL — ABNORMAL HIGH (ref 65–99)
Glucose-Capillary: 217 mg/dL — ABNORMAL HIGH (ref 65–99)
Glucose-Capillary: 201 mg/dL — ABNORMAL HIGH (ref 65–99)
Glucose-Capillary: 186 mg/dL — ABNORMAL HIGH (ref 65–99)
Glucose-Capillary: 146 mg/dL — ABNORMAL HIGH (ref 65–99)
Glucose-Capillary: 150 mg/dL — ABNORMAL HIGH (ref 65–99)
Glucose-Capillary: 161 mg/dL — ABNORMAL HIGH (ref 65–99)
Glucose-Capillary: 155 mg/dL — ABNORMAL HIGH (ref 65–99)
Glucose-Capillary: 156 mg/dL — ABNORMAL HIGH (ref 65–99)
Glucose-Capillary: 158 mg/dL — ABNORMAL HIGH (ref 65–99)

## 2016-03-24 LAB — CBC
HCT: 25.9 % — ABNORMAL LOW (ref 39.0–52.0)
HCT: 29.9 % — ABNORMAL LOW (ref 39.0–52.0)
HEMATOCRIT: 26.6 % — AB (ref 39.0–52.0)
HEMATOCRIT: 24.5 % — AB (ref 39.0–52.0)
HEMOGLOBIN: 8.9 g/dL — AB (ref 13.0–17.0)
HEMOGLOBIN: 10.4 g/dL — AB (ref 13.0–17.0)
HEMOGLOBIN: 9.2 g/dL — AB (ref 13.0–17.0)
HEMOGLOBIN: 9.6 g/dL — AB (ref 13.0–17.0)
MCH: 32.2 pg (ref 26.0–34.0)
MCH: 32.5 pg (ref 26.0–34.0)
MCH: 31.3 pg (ref 26.0–34.0)
MCH: 31.9 pg (ref 26.0–34.0)
MCHC: 34.8 g/dL (ref 30.0–36.0)
MCHC: 35.5 g/dL (ref 30.0–36.0)
MCHC: 36.1 g/dL — AB (ref 30.0–36.0)
MCHC: 36.3 g/dL — AB (ref 30.0–36.0)
MCV: 88.1 fL (ref 78.0–100.0)
MCV: 89.4 fL (ref 78.0–100.0)
MCV: 89.3 fL (ref 78.0–100.0)
MCV: 91.7 fL (ref 78.0–100.0)
Platelets: 155 10*3/uL (ref 150–400)
Platelets: 174 10*3/uL (ref 150–400)
Platelets: 174 10*3/uL (ref 150–400)
Platelets: 214 10*3/uL (ref 150–400)
RBC: 2.98 MIL/uL — ABNORMAL LOW (ref 4.22–5.81)
RBC: 2.94 MIL/uL — ABNORMAL LOW (ref 4.22–5.81)
RBC: 2.74 MIL/uL — ABNORMAL LOW (ref 4.22–5.81)
RBC: 3.26 MIL/uL — AB (ref 4.22–5.81)
RDW: 14.6 % (ref 11.5–15.5)
RDW: 15.2 % (ref 11.5–15.5)
RDW: 15.3 % (ref 11.5–15.5)
RDW: 16.1 % — AB (ref 11.5–15.5)
WBC: 10.9 10*3/uL — ABNORMAL HIGH (ref 4.0–10.5)
WBC: 11.3 10*3/uL — ABNORMAL HIGH (ref 4.0–10.5)
WBC: 12.7 10*3/uL — ABNORMAL HIGH (ref 4.0–10.5)
WBC: 12.8 10*3/uL — ABNORMAL HIGH (ref 4.0–10.5)

## 2016-03-24 LAB — URINE CULTURE
Culture: <10000 — AB
SPECIAL REQUESTS: NORMAL

## 2016-03-24 LAB — BLOOD GAS, ARTERIAL
ACID-BASE EXCESS: 3.1 mmol/L — AB (ref 0.0–2.0)
Bicarbonate: 27.7 mmol/L (ref 20.0–28.0)
DRAWN BY: 406621
FIO2: 90
MECHVT: 620 mL
O2 Saturation: 96.5 %
PATIENT TEMPERATURE: 98.6
PEEP: 5 cmH2O
PH ART: 7.387 (ref 7.350–7.450)
PO2 ART: 88.2 mmHg (ref 83.0–108.0)
RATE: 35 resp/min
pCO2 arterial: 47.1 mmHg (ref 32.0–48.0)

## 2016-03-24 LAB — ETHYLENE GLYCOL: Ethylene Glycol Lvl: NOT DETECTED mg/dL

## 2016-03-24 LAB — RENAL FUNCTION PANEL
Albumin: 1.9 g/dL — ABNORMAL LOW (ref 3.5–5.0)
Anion gap: 14 (ref 5–15)
BUN: 107 mg/dL — AB (ref 6–20)
CHLORIDE: 92 mmol/L — AB (ref 101–111)
CO2: 24 mmol/L (ref 22–32)
Calcium: 7 mg/dL — ABNORMAL LOW (ref 8.9–10.3)
Creatinine, Ser: 3.41 mg/dL — ABNORMAL HIGH (ref 0.61–1.24)
GFR calc Af Amer: 22 mL/min — ABNORMAL LOW (ref >60)
GFR calc non Af Amer: 19 mL/min — ABNORMAL LOW (ref >60)
Glucose, Bld: 275 mg/dL — ABNORMAL HIGH (ref 65–99)
POTASSIUM: 3.6 mmol/L (ref 3.5–5.1)
Phosphorus: 3.6 mg/dL (ref 2.5–4.6)
Sodium: 130 mmol/L — ABNORMAL LOW (ref 135–145)

## 2016-03-24 LAB — VOLATILES,BLD-ACETONE,ETHANOL,ISOPROP,METHANOL
ACETONE, BLOOD: NEGATIVE % (ref 0.000–0.010)
ETHANOL, BLOOD: NEGATIVE % (ref 0.000–0.010)
ISOPROPANOL, BLOOD: NEGATIVE % (ref 0.000–0.010)
Methanol, blood: NEGATIVE % (ref 0.000–0.010)

## 2016-03-24 LAB — MAGNESIUM: MAGNESIUM: 2.3 mg/dL (ref 1.7–2.4)

## 2016-03-24 LAB — PROCALCITONIN: PROCALCITONIN: 11.49 ng/mL

## 2016-03-24 LAB — TROPONIN I
TROPONIN I: 0.05 ng/mL — AB (ref <0.03)
Troponin I: 0.06 ng/mL (ref <0.03)
Troponin I: 0.07 ng/mL (ref <0.03)
Troponin I: 0.07 ng/mL (ref <0.03)

## 2016-03-24 LAB — HIV ANTIBODY (ROUTINE TESTING W REFLEX): HIV Screen 4th Generation wRfx: NONREACTIVE

## 2016-03-24 LAB — FIBRINOGEN: FIBRINOGEN: 372 mg/dL (ref 210–475)

## 2016-03-24 LAB — PHOSPHORUS: Phosphorus: 5.7 mg/dL — ABNORMAL HIGH (ref 2.5–4.6)

## 2016-03-24 LAB — BLOOD PRODUCT ORDER (VERBAL) VERIFICATION

## 2016-03-24 LAB — APTT: APTT: 28 s (ref 24–36)

## 2016-03-24 MED ORDER — SODIUM BICARBONATE 8.4 % IV SOLN
50.0000 meq | Freq: Once | INTRAVENOUS | Status: AC
  Administered 2016-03-24: 50 meq via INTRAVENOUS
  Filled 2016-03-24: qty 50

## 2016-03-24 MED ORDER — DEXMEDETOMIDINE HCL IN NACL 200 MCG/50ML IV SOLN: 0.4000 ug/kg/h | INTRAVENOUS | Status: DC

## 2016-03-24 MED ORDER — INSULIN ASPART 100 UNIT/ML ~~LOC~~ SOLN
5.0000 [IU] | Freq: Once | SUBCUTANEOUS | Status: AC
  Administered 2016-03-24: 5 [IU] via SUBCUTANEOUS

## 2016-03-24 MED ORDER — INSULIN ASPART 100 UNIT/ML ~~LOC~~ SOLN
0.0000 [IU] | SUBCUTANEOUS | Status: DC
  Administered 2016-03-24 (×2): 8 [IU] via SUBCUTANEOUS

## 2016-03-24 MED ORDER — SODIUM BICARBONATE 8.4 % IV SOLN
100.0000 meq | Freq: Once | INTRAVENOUS | Status: AC
  Administered 2016-03-24: 100 meq via INTRAVENOUS
  Filled 2016-03-24: qty 100

## 2016-03-24 MED ORDER — SODIUM CHLORIDE 0.9 % IV SOLN: 0.4000 ug/kg/h | INTRAVENOUS | Status: DC

## 2016-03-24 MED ORDER — PIPERACILLIN-TAZOBACTAM 3.375 G IVPB
3.3750 g | Freq: Three times a day (TID) | INTRAVENOUS | Status: AC
  Administered 2016-03-24 – 2016-04-02 (×30): 3.375 g via INTRAVENOUS
  Filled 2016-03-24 (×30): qty 50

## 2016-03-24 MED ORDER — INSULIN REGULAR HUMAN 100 UNIT/ML IJ SOLN
INTRAMUSCULAR | Status: DC
  Administered 2016-03-24: 1.9 [IU]/h via INTRAVENOUS
  Filled 2016-03-24: qty 2.5

## 2016-03-24 MED ORDER — SODIUM CHLORIDE 0.9 % IV SOLN
0.5000 mg/h | INTRAVENOUS | Status: DC
  Administered 2016-03-24: 4 mg/h via INTRAVENOUS
  Administered 2016-03-24: 1 mg/h via INTRAVENOUS
  Administered 2016-03-24 – 2016-03-28 (×6): 4 mg/h via INTRAVENOUS
  Filled 2016-03-24 (×9): qty 10

## 2016-03-24 NOTE — Progress Notes (Signed)
eLink Physician-Brief Progress Note Patient Name: Jesse Hartman DOB: October 18, 1960 MRN: [id number]   Date of Service  03/24/2016  HPI/Events of Note  BP remains soft (BP = 103/62) in spite of Vasopressin IV infusion at shock dose, Norepinephrine IV infusion at 45 mcg/min and Phenylephrine IV nfusion at 400 mcg/min. CVP = 13. Last pH = 7.164.  eICU Interventions  Will order: 1. D/C Precedex IV infusion. 2. Versed IV infusion. Titrate to RASS =  0 to -1.  3. ABG now.      Intervention Category Major Interventions: Acid-Base disturbance - evaluation and management;Hypotension - evaluation and management  Lysle Dingwall 03/24/2016, 12:34 AM/

## 2016-03-24 NOTE — Progress Notes (Signed)
eLink Physician-Brief Progress Note Patient Name: Jesse Hartman DOB: 1960-11-10 MRN: [id number]   Date of Service  03/24/2016  HPI/Events of Note  Hyperglycemia - Blood glucose = 263.  eICU Interventions  Will order: 1. Novolog 5 units [place] X 1 now.      Intervention Category Major Interventions: Hyperglycemia - active titration of insulin therapy  Prajna Vanderpool Eugene 03/24/2016, 1:06 AM

## 2016-03-24 NOTE — Progress Notes (Signed)
eLink Physician-Brief Progress Note Patient Name: Jesse Hartman DOB: 08-28-1960 MRN: [id number]   Date of Service  03/24/2016  HPI/Events of Note  ultiple issues: 1. ABG on 80%?PRVC 35/TV 620/P 5 = 7.29/51/81/24.8 and 2. Blood glucose = 285.  eICU Interventions  Will order: 1. NaHCO3 50 meq IV now. 2. Repeat ABG at 7:30 AM. 3. Change Q 4 hour SSI to moderate Novolog SSI.        Vesta Wheeland Eugene 03/24/2016, 4:32 AM

## 2016-03-24 NOTE — Progress Notes (Signed)
Daily Rounding Note  03/24/2016, 8:23 AM  LOS: 1 day   SUBJECTIVE:   Chief complaint:  Hematemesis.   NGT pulled for EGD and not replaced.  Remains on pressors, sedatives, PPI and octreotide drips.  No BMs.    OBJECTIVE:         Vital signs in last 24 hours:    Temp:  [97.8 F (36.6 C)-99.1 F (37.3 C)] 99.1 F (37.3 C) (03/20 0745) Pulse Rate:  [25-123] 103 (03/20 0800) Resp:  [11-35] 35 (03/20 0800) BP: (67-151)/(35-109) 114/66 (03/20 0800) SpO2:  [88 %-100 %] 97 % (03/20 0800) Arterial Line BP: (61-130)/(40-69) 102/55 (03/20 0800) FiO2 (%):  [70 %-100 %] 80 % (03/20 0800) Weight:  [130.4 kg (287 lb 7.7 oz)-131.5 kg (290 lb)] 130.4 kg (287 lb 7.7 oz) (03/20 0100) Last BM Date: 03/15/2016 Filed Weights   03/13/2016 1317 03/24/16 0100  Weight: 131.5 kg (290 lb) 130.4 kg (287 lb 7.7 oz)   General: sedated on vent.  Obese.  Looks ill.    Heart: RRR.  External pacer in place.   Chest: clear bil.  ETT/Vent in place.   Abdomen: obese, protuberant.  Quiet.  NT (caveat: sedated)  Extremities: slight non-pitting edema in feet.  Arms swollen.  Abrassions on knees Neuro/Psych:  Sedated, non-agitated.  No twitching.   Intake/Output from previous day: 03/19 0701 - 03/20 0700 In: 8322.3 [I.V.:4832.3; Blood:1290; IV Piggyback:2200] Out: 0865 [Urine:2900; Emesis/NG output:3140]  Intake/Output this shift: Total I/O In: 351.5 [I.V.:351.5] Out: -   Lab Results:  Recent Labs  03/06/2016 1936 03/07/2016 2128 03/24/16 0001 03/24/16 0435  WBC 12.6*  --  11.3* 10.9*  HGB 5.4* 7.1* 10.4* 9.6*  HCT 15.6* 21.0* 29.9* 26.6*  PLT 188  --  214 174   BMET  Recent Labs  03/22/2016 2047 03/16/2016 2128 03/24/16 0047 03/24/16 0435  NA 128* 127* 127* 130*  K 5.2* 5.2* 4.3 3.6  CL 94* 91* 96* 92*  CO2 21*  --  20* 24  GLUCOSE 233* 228* 263* 275*  BUN 111* 124* 109* 107*  CREATININE 3.73* 3.70* 3.56* 3.41*  CALCIUM 7.1*  --  7.2*  7.0*   LFT  Recent Labs  03/22/2016 1318 03/24/16 0435  PROT 5.0*  --   ALBUMIN 2.4* 1.9*  AST 93*  --   ALT 51  --   ALKPHOS 48  --   BILITOT 0.9  --    PT/INR  Recent Labs  03/19/2016 1318  LABPROT 16.8*  INR 1.35   Hepatitis Panel No results for input(s): HEPBSAG, HCVAB, HEPAIGM, HEPBIGM in the last 72 hours.  Studies/Results: Ct Abdomen Pelvis Wo Contrast  Result Date: 03/12/2016 CLINICAL DATA:  Coffee-ground emesis EXAM: CT ABDOMEN AND PELVIS WITHOUT CONTRAST TECHNIQUE: Multidetector CT imaging of the abdomen and pelvis was performed following the standard protocol without IV contrast. COMPARISON:  01/19/2013 FINDINGS: Lower chest: Lung bases are clear. No effusions. Heart is normal size. Hepatobiliary: Diffuse fatty infiltration of the liver. Node focal abnormality. Pancreas: Stranding noted around the pancreas compatible with acute pancreatitis. Spleen: No focal abnormality.  Normal size. Adrenals/Urinary Tract: No adrenal abnormality. No focal renal abnormality. No stones or hydronephrosis. Urinary bladder is unremarkable. Stomach/Bowel: Scattered sigmoid diverticula. No active diverticulitis. Small bowel is decompressed. Stomach is distended with fluid. Vascular/Lymphatic: No evidence of aneurysm or adenopathy. Aortic calcifications. Reproductive: No visible focal abnormality. Other: No free fluid or free air. Small inguinal hernias containing fat. Musculoskeletal:  No acute bony abnormality. Postoperative changes from posterior fusion at L4-5. Degenerative disc disease throughout the lumbar spine. IMPRESSION: Stranding around the pancreas compatible with acute pancreatitis. Fatty infiltration of the liver. Distended, fluid-filled stomach. Electronically Signed   By: Rolm Baptise M.D.   On: 03/30/2016 15:24   Ct Head Wo Contrast  Result Date: 03/18/2016 CLINICAL DATA:  Altered mental status.  Vomiting. EXAM: CT HEAD WITHOUT CONTRAST TECHNIQUE: Contiguous axial images were  obtained from the base of the skull through the vertex without intravenous contrast. COMPARISON:  01/14/2016 head CT . FINDINGS: Brain: No evidence of parenchymal hemorrhage or extra-axial fluid collection. No mass lesion, mass effect, or midline shift. No CT evidence of acute infarction. Generalized cerebral volume loss. No ventriculomegaly. Vascular: No hyperdense vessel or unexpected calcification. Skull: No evidence of calvarial fracture. Sinuses/Orbits: Mucoperiosteal thickening in the bilateral ethmoidal air cells. Layering secretions in the dependent left maxillary sinus and left nasopharynx. Other:  The mastoid air cells are unopacified. IMPRESSION: 1.  No evidence of acute intracranial abnormality. 2. Generalized cerebral volume loss. 3. Mild paranasal sinusitis of uncertain acuity . Electronically Signed   By: Ilona Sorrel M.D.   On: 03/13/2016 15:20   Portable Chest Xray  Result Date: 03/24/2016 CLINICAL DATA:  Shortness of breath. EXAM: PORTABLE CHEST 1 VIEW COMPARISON:  Radiograph March 23, 2016. FINDINGS: Stable cardiomediastinal silhouette. Endotracheal tube is unchanged in position. Nasogastric tube has been removed. Left internal jugular catheter is unchanged. No pneumothorax is noted. Bilateral perihilar and basilar opacities are noted concerning for edema or pneumonia. Old left clavicular fracture is noted. IMPRESSION: Bilateral perihilar and basilar edema or pneumonia is noted. Electronically Signed   By: Marijo Conception, M.D.   On: 03/24/2016 07:16   Dg Chest Port 1 View  Result Date: 03/13/2016 CLINICAL DATA:  Respiratory failure EXAM: PORTABLE CHEST 1 VIEW COMPARISON:  03/05/2016 FINDINGS: Cardiac shadow is stable. Endotracheal tube, nasogastric catheter and left jugular central line are now seen. No pneumothorax is noted. Increased patchy infiltrate is noted in the mid and upper right lung. This may represent a component of asymmetric edema as there is some mild vascular congestion  noted. No other focal abnormality is seen. IMPRESSION: Increased vascular congestion and right-sided patchy infiltrative change which may represent asymmetric edema. Tubes and lines as described without complicating factors. Electronically Signed   By: Inez Catalina M.D.   On: 03/21/2016 21:25   Dg Chest Port 1 View  Result Date: 03/14/2016 CLINICAL DATA:  Altered mental status. Shortness of breath. History of hypertension, COPD and asthma. EXAM: PORTABLE CHEST 1 VIEW COMPARISON:  01/14/2016 and 08/23/2014. FINDINGS: 1321 hour. Low lung volumes with mild elevation of left hemidiaphragm. The heart size and mediastinal contours are stable. The lungs are clear. There is no pleural effusion or pneumothorax. No acute osseous findings are seen. Old left clavicle fracture is partially imaged. IMPRESSION: Suboptimal inspiration. No acute cardiopulmonary process identified. Electronically Signed   By: Richardean Sale M.D.   On: 04/04/2016 13:40    ASSESMENT:   *  UGIB with melena, Hgb 5.4.  EGD 3/19: severe erosive, ulcerative esophagitis.  Old food, old blood in stomach.  No gastric ulcers or gastritis, no varices.  Duodenum normal.   *  Blood loss anemia.  Hgb 5.4 >> 9.6 post PRBC x 4.  MCV normal.   *  PEA arrest. Elevated Troponins to 0.06, likely demand ischemia.    *  AMS.  No acute changes on head CT.    *  AKI.  Rhabdo with CK total 766. Not anuric, 2.9 liters urine recorded through 0700 this AM.   .   *  Pancreatitis per CT.  Lipase 24.   *  Alcoholism.  Fatty liver on CT 3/19.  Coags and platelets normal. Ammonia level 116.   *  Hyponatremia.     *  Hyperglycemia.  Seemingly new dx DM 2.    *  Hypoalbuminemia, PCM.    *  Hyperkalemia.  Corrected.  Started on Lasix, potassium last week for chronic/progressive LE swelling.  *  Hx Barretts esophagus, esophagitis.  Incomplete/confined MW tear per CT and esophagram in 05/2009.   03/2015  colonoscopy: pan diverticulosis and diminutive polyps  x 4, all TAs.     PLAN   *  Stop octreotide as no evidence portal htn, varices.  Leave 72 hour Protonix drip in place.    *  May need NG or OG tube placement for meds administration but with all IV meds in place for now, ok to be without.  *  Given hx of LE swelling, probably needs Echo to measure EF at some point.      Azucena Freed  03/24/2016, 8:23 AM Pager: [phone]  GI ATTENDING  Interval history data reviewed. Endoscopy report reviewed. Patient seen and examined. Patient known to me from previous hospitalization and outpatient care. Failed to follow-up this past year as requested. The patient presents with shock, bleeding secondary to erosive esophagitis, multiple laboratory and electrolyte probations, respiratory failure, and acute renal insufficiency. This on the background of chronic alcoholism. For his erosive esophagitis he needs IV PPI therapy until he is able to take by mouth therapy at which point it should be pantoprazole 40 mg twice daily. Question he was noncompliant with his outpatient therapy given the degree of esophagitis. At this point, cannot attribute issues with hypotension to GI bleeding. Continue with overall critical care management per CCM. GI available if needed for questions or problems. Thank you  Docia Chuck. Geri Seminole., M.D. [facility] Division of Gastroenterology

## 2016-03-24 NOTE — Progress Notes (Signed)
eLink Physician-Brief Progress Note Patient Name: Jesse Hartman DOB: October 27, 1960 MRN: [id number]   Date of Service  03/24/2016  HPI/Events of Note  Troponin = 0.05 - Likely d/t demand ischemia. Patient is on 3 vasopressors, therefore, will avoid B-Blocker and GI bleeding, therefore, will avoid ASA and Heparin IV infusion.   eICU Interventions  Continue to trend Troponin.     Intervention Category Major Interventions: Hyperglycemia - active titration of insulin therapy Intermediate Interventions: Diagnostic test evaluation  Umair Rosiles Eugene 03/24/2016, 1:07 AM

## 2016-03-24 NOTE — Progress Notes (Signed)
eLink Physician-Brief Progress Note Patient Name: HUEL CENTOLA DOB: 03-29-1960 MRN: [id number]   Date of Service  03/24/2016  HPI/Events of Note  Agitation - Request for bilateral soft wrist restraints.   eICU Interventions  Will order bilateral soft wrist restraints.      Intervention Category Minor Interventions: Agitation / anxiety - evaluation and management  Machael Raine Eugene 03/24/2016, 1:47 AM

## 2016-03-24 NOTE — Op Note (Signed)
[facility] Patient Name: Jesse Hartman Procedure Date : 03/22/2016 MRN: [id number] Attending MD: Mauri Pole , MD Date of Birth: 23-Jun-1960 CSN: [id number] Age: 56 Admit Type: Inpatient Procedure:                Upper GI endoscopy Indications:              Suspected upper gastrointestinal bleeding Providers:                Mauri Pole, MD, Hilma Favors, RN, Corliss Parish, Technician Referring MD:              Medicines:                Patient sedated by ICU team on fentanyl gtt, Ativan                            bolus prn and Precedex. Complications:            No immediate complications. Estimated Blood Loss:     Estimated blood loss was minimal. Procedure:                Pre-Anesthesia Assessment:                           - Prior to the procedure, a History and Physical                            was performed, and patient medications and                            allergies were reviewed. The patient's tolerance of                            previous anesthesia was also reviewed. The risks                            and benefits of the procedure and the sedation                            options and risks were discussed with the patient.                            All questions were answered, and informed consent                            was obtained. Prior Anticoagulants: The patient has                            taken no previous anticoagulant or antiplatelet                            agents. ASA Grade Assessment: II - A patient with  mild systemic disease. After reviewing the risks                            and benefits, the patient was deemed in                            satisfactory condition to undergo the procedure.                           After obtaining informed consent, the endoscope was                            passed under direct vision. Throughout the            procedure, the patient's blood pressure, pulse, and                            oxygen saturations were monitored continuously. The                            EG-[id number] ([id number]) scope was introduced through the                            mouth, and advanced to the second part of duodenum.                            The upper GI endoscopy was accomplished without                            difficulty. The patient tolerated the procedure                            well. Scope In: Scope Out: Findings:      Severe esophagitis with diffuse ulceration of the mucosa was found 25 to       44 cm from the incisors. Noted small clots in the distal esophagus       removed with lavage and suction, no active bleeding or oozing noted       after removal of clots. No evidence of esophageal varices . Due to       severe esophagitis and ulcerated mucosa cannot dileneate the extent of       Barrett's esophagus. Patulous EG junction with free reflux.      A medium amount of food (residue) and old blood was found in the gastric       fundus. No gastric ulcers or varices      The examined duodenum was normal. Impression:               - Severe esophagitis with multiple ulcers in the                            distal and mid esophagus.                           - A medium amount of food (residue) and old blood  in the stomach with no varices or gastric ulcers.                           - Normal examined duodenum.                           - No specimens collected. Moderate Sedation:      N/A Recommendation:           - Patient has a contact number available for                            emergencies. The signs and symptoms of potential                            delayed complications were discussed with the                            patient. Return to normal activities tomorrow.                            Written discharge instructions were provided to the                             patient.                           - NPO for today                           - Clears tomorrow and advance as tolerated                           - Continue present medications.                           - Continue PPI                           - Monitor Hgb and transfuse as needed to maintain                            Hgb 7                           - Inpatient GI team will follow up in the AM Procedure Code(s):        --- Professional ---                           [phone], Esophagogastroduodenoscopy, flexible,                            transoral; diagnostic, including collection of                            specimen(s) by brushing or washing, when performed                            (  separate procedure) Diagnosis Code(s):        --- Professional ---                           K20.9, Esophagitis, unspecified CPT copyright 2016 [employer]. All rights reserved. The codes documented in this report are preliminary and upon coder review may  be revised to meet current compliance requirements. Mauri Pole, MD 04/02/2016 11:47:52 PM This report has been signed electronically. Number of Addenda: 0

## 2016-03-24 NOTE — Progress Notes (Signed)
eLink Physician-Brief Progress Note Patient Name: Jesse Hartman DOB: 1960/03/31 MRN: [id number]   Date of Service  03/24/2016  HPI/Events of Note  ABG on 90%/PRVC 35/TV 620/P 5 = 7.22/50.9/95/21.1.  eICU Interventions  Will order: 1. NaHCO3 100 meq IV now. 2. Increase NaHCO3 IV infusion rate to 150 mL/hour. 3. ABG at 5 AM.      Intervention Category Major Interventions: Acid-Base disturbance - evaluation and management;Respiratory failure - evaluation and management  Emali Heyward Eugene 03/24/2016, 1:26 AM

## 2016-03-24 NOTE — Progress Notes (Signed)
PULMONARY / CRITICAL CARE MEDICINE   Name: Jesse Hartman MRN: [id number] DOB: 06/18/1960    ADMISSION DATE:  03/08/2016 CONSULTATION DATE:  03/10/2016  REFERRING MD:  Dr. Vanita Panda  CHIEF COMPLAINT:  AMS  HISTORY OF PRESENT ILLNESS:   56 year old male with PMH as below, which includes ETOH abuse (24 pack per day), COPD, Barrett's esophagus, HTN, Seizures, and OSA. He was started on lasix and potassium supplementation approximately 3/12. He was found down by his daughter 3/19 and brought to ED via EMS. She first noticed him to be acting strange 3/17 when he was slurring his words and speaking gibberish on the telephone. Then 3/19 she tried to call him on the phone again and he did not answer so she went to his house and found him lying in his own stool/vomit, which she described as bloody. Upon arrival to ED he was hypotensive, which was slow to respond to IVF bolus. He was able to follow commands, but was confused. Laboratory evaluation significant for serum creatinine 4.2, K 6.2, Sodium 121, BUN 103, Co2 12.4,  and Lactic acid 11. Imaging of head and abd/pelvis unrevealing. While in CT he had episode of projectile vomiting of coffee ground emesis. Despite IVF resuscitation he remained hypotensive and PCCM called to admit.   SUBJECTIVE/Interval:   Overnight: Suffered cardiac arrest of unclear origin as all labs were improving. Possibly hemorrhagic d/t GI bleed but Scope negative for acute bleeding. Now on vent and pressors.  Today he remains sedated, but was able to follow commands on WUA per RN, however, he became agitated and needed to go back on sedation.   VITAL SIGNS: BP (!) 135/59   Pulse (!) 111   Temp 99.1 F (37.3 C) (Oral)   Resp (!) 24   Ht 6' (1.829 m)   Wt 130.4 kg (287 lb 7.7 oz)   SpO2 99%   BMI 38.99 kg/m   HEMODYNAMICS: CVP:  [10 mmHg-15 mmHg] 10 mmHg  VENTILATOR SETTINGS: Vent Mode: PRVC FiO2 (%):  [70 %-100 %] 80 % Set Rate:  [30 bmp-35 bmp] 35 bmp Vt Set:  [550  mL-620 mL] 620 mL PEEP:  [5 cmH20] 5 cmH20 Plateau Pressure:  [15 cmH20-26 cmH20] 24 cmH20  INTAKE / OUTPUT: I/O last 3 completed shifts: In: 8322.3 [I.V.:4832.3; Blood:1290; IV RSWNIOEVO:3500] Out: 9381 [Urine:2900; Emesis/NG output:3140]  PHYSICAL EXAMINATION:  General:  Obese male in NAD on vent Neuro:  Sedated, did follow commands on WUA.  HEENT:  [place]/AT, PERRL, no JVD Cardiovascular:  RRR, no MRG Lungs:  coarse Abdomen:  Soft, non-distended, protuberant Musculoskeletal:  No acute deformity Skin:  Pallor, otherwise intact  LABS:  BMET  Recent Labs Lab 03/07/2016 2047 03/12/2016 2128 03/24/16 0047 03/24/16 0435  NA 128* 127* 127* 130*  K 5.2* 5.2* 4.3 3.6  CL 94* 91* 96* 92*  CO2 21*  --  20* 24  BUN 111* 124* 109* 107*  CREATININE 3.73* 3.70* 3.56* 3.41*  GLUCOSE 233* 228* 263* 275*    Electrolytes  Recent Labs Lab 03/19/2016 2047 03/24/16 0047 03/24/16 0435  CALCIUM 7.1* 7.2* 7.0*  MG  --  2.3  --   PHOS  --  5.7* 3.6    CBC  Recent Labs Lab 03/31/2016 1936 03/26/2016 2128 03/24/16 0001 03/24/16 0435  WBC 12.6*  --  11.3* 10.9*  HGB 5.4* 7.1* 10.4* 9.6*  HCT 15.6* 21.0* 29.9* 26.6*  PLT 188  --  214 174    Coag's  Recent Labs  Lab 03/24/2016 1318 03/24/16 0047  APTT  --  28  INR 1.35  --     Sepsis Markers  Recent Labs Lab 03/28/2016 1806 03/16/2016 1823 03/22/2016 2124 03/06/2016 2153  LATICACIDVEN 8.8*  --  2.2* 1.87  PROCALCITON  --  4.16  --   --     ABG  Recent Labs Lab 03/24/16 0115 03/24/16 0339 03/24/16 0728  PHART 7.224* 7.291* 7.387  PCO2ART 50.9* 51.6* 47.1  PO2ART 95.0 81.0* 88.2    Liver Enzymes  Recent Labs Lab 03/22/2016 1318 03/24/16 0435  AST 93*  --   ALT 51  --   ALKPHOS 48  --   BILITOT 0.9  --   ALBUMIN 2.4* 1.9*    Cardiac Enzymes  Recent Labs Lab 03/24/16 0047 03/24/16 0435  TROPONINI 0.05* 0.06*    Glucose  Recent Labs Lab 04/01/2016 1319 03/11/2016 2356 03/24/16 0335 03/24/16 0730  GLUCAP  153* 263* 285* 259*    Imaging Ct Abdomen Pelvis Wo Contrast  Result Date: 03/10/2016 CLINICAL DATA:  Coffee-ground emesis EXAM: CT ABDOMEN AND PELVIS WITHOUT CONTRAST TECHNIQUE: Multidetector CT imaging of the abdomen and pelvis was performed following the standard protocol without IV contrast. COMPARISON:  01/19/2013 FINDINGS: Lower chest: Lung bases are clear. No effusions. Heart is normal size. Hepatobiliary: Diffuse fatty infiltration of the liver. Node focal abnormality. Pancreas: Stranding noted around the pancreas compatible with acute pancreatitis. Spleen: No focal abnormality.  Normal size. Adrenals/Urinary Tract: No adrenal abnormality. No focal renal abnormality. No stones or hydronephrosis. Urinary bladder is unremarkable. Stomach/Bowel: Scattered sigmoid diverticula. No active diverticulitis. Small bowel is decompressed. Stomach is distended with fluid. Vascular/Lymphatic: No evidence of aneurysm or adenopathy. Aortic calcifications. Reproductive: No visible focal abnormality. Other: No free fluid or free air. Small inguinal hernias containing fat. Musculoskeletal: No acute bony abnormality. Postoperative changes from posterior fusion at L4-5. Degenerative disc disease throughout the lumbar spine. IMPRESSION: Stranding around the pancreas compatible with acute pancreatitis. Fatty infiltration of the liver. Distended, fluid-filled stomach. Electronically Signed   By: Rolm Baptise M.D.   On: 03/31/2016 15:24   Ct Head Wo Contrast  Result Date: 03/19/2016 CLINICAL DATA:  Altered mental status.  Vomiting. EXAM: CT HEAD WITHOUT CONTRAST TECHNIQUE: Contiguous axial images were obtained from the base of the skull through the vertex without intravenous contrast. COMPARISON:  01/14/2016 head CT . FINDINGS: Brain: No evidence of parenchymal hemorrhage or extra-axial fluid collection. No mass lesion, mass effect, or midline shift. No CT evidence of acute infarction. Generalized cerebral volume loss. No  ventriculomegaly. Vascular: No hyperdense vessel or unexpected calcification. Skull: No evidence of calvarial fracture. Sinuses/Orbits: Mucoperiosteal thickening in the bilateral ethmoidal air cells. Layering secretions in the dependent left maxillary sinus and left nasopharynx. Other:  The mastoid air cells are unopacified. IMPRESSION: 1.  No evidence of acute intracranial abnormality. 2. Generalized cerebral volume loss. 3. Mild paranasal sinusitis of uncertain acuity . Electronically Signed   By: Ilona Sorrel M.D.   On: 03/14/2016 15:20   Portable Chest Xray  Result Date: 03/24/2016 CLINICAL DATA:  Shortness of breath. EXAM: PORTABLE CHEST 1 VIEW COMPARISON:  Radiograph March 23, 2016. FINDINGS: Stable cardiomediastinal silhouette. Endotracheal tube is unchanged in position. Nasogastric tube has been removed. Left internal jugular catheter is unchanged. No pneumothorax is noted. Bilateral perihilar and basilar opacities are noted concerning for edema or pneumonia. Old left clavicular fracture is noted. IMPRESSION: Bilateral perihilar and basilar edema or pneumonia is noted. Electronically Signed   By: Jeneen Rinks  Murlean Caller, M.D.   On: 03/24/2016 07:16   Dg Chest Port 1 View  Result Date: 04/04/2016 CLINICAL DATA:  Respiratory failure EXAM: PORTABLE CHEST 1 VIEW COMPARISON:  03/07/2016 FINDINGS: Cardiac shadow is stable. Endotracheal tube, nasogastric catheter and left jugular central line are now seen. No pneumothorax is noted. Increased patchy infiltrate is noted in the mid and upper right lung. This may represent a component of asymmetric edema as there is some mild vascular congestion noted. No other focal abnormality is seen. IMPRESSION: Increased vascular congestion and right-sided patchy infiltrative change which may represent asymmetric edema. Tubes and lines as described without complicating factors. Electronically Signed   By: Inez Catalina M.D.   On: 03/08/2016 21:25   Dg Chest Port 1 View  Result  Date: 03/31/2016 CLINICAL DATA:  Altered mental status. Shortness of breath. History of hypertension, COPD and asthma. EXAM: PORTABLE CHEST 1 VIEW COMPARISON:  01/14/2016 and 08/23/2014. FINDINGS: 1321 hour. Low lung volumes with mild elevation of left hemidiaphragm. The heart size and mediastinal contours are stable. The lungs are clear. There is no pleural effusion or pneumothorax. No acute osseous findings are seen. Old left clavicle fracture is partially imaged. IMPRESSION: Suboptimal inspiration. No acute cardiopulmonary process identified. Electronically Signed   By: Richardean Sale M.D.   On: 03/11/2016 13:40     STUDIES:  CT head 3/19 > No evidence of acute intracranial abnormality. Generalized cerebral volume loss. Mild paranasal sinusitis of uncertain acuity . CT abd/pelv 3/19 > Stranding around the pancreas compatible with acute pancreatitis. Fatty infiltration of the liver. Distended, fluid-filled stomach. EGD 3/19 > Severe esophagitis with no acute hemorrhage  CULTURES: Blood 3/19 > Urine 3/19 >  ANTIBIOTICS: Zosyn 3/19 >  SIGNIFICANT EVENTS: 3/19 admit, ARF 3/19 PM cardiac arrest 6 mins, intubated. On pressors.   LINES/TUBES: CVL 3/19 > ETT 3/19 >  DISCUSSION: 56 year old male admitted with AMS and AKI. Was improving but suffered massive coffee ground emesis and subsequent brief cardiac arrest. Intubated and on pressors.   ASSESSMENT / PLAN:  PULMONARY A: Acute hypoxemic respiratory failure: suspect he aspirated on emesis, CXR also potentially concerning for edema, favor PNA. COPD without acute exacerbation OSA  P:   Full vent support > increased PEEP this AM Daily CXR ABG follow Titrate FiO2 for sats > 92% PRN bronchodilators   CARDIOVASCULAR A:  Shock: hemorrhagic/hypovolemic.  H/o HTN, CHF P:  Telemetry monitoring IVF resuscitation - CVP 10, lactic cleared Echo Continue levophed (50mcg), phenylephrine (244mcg), and vaso for MAP > 60  Continue to  trend troponin  RENAL A:   Acute renal failure > r/t dehydration/ARB/Lasix/shock. (slowly improving) Hyperkalemia in setting AKI and recent supplementation: ECG with peaked T AG acidosis secondary to lactic/renal failure (resolved) Hyponatremia dehydration / beer potomania  P:   Continue Bicarb infusion > reduce rate to 50/Hr Nephrology following > hope can recover without HD BMP q 8 hrs  GASTROINTESTINAL A:   Acute pancreatitis Likely GIB > severe esophagitis on EGD. No acute bleed. No intervention H/o Barrett's esophagus  P:   NPO Blood products per heme/onc section below Protonix infusion Octreotide  HEMATOLOGIC A:   ABLA secondary to likely GIB  P:  Serial CBC q 8 Transfuse to keep HGB > 7  INFECTIOUS A:   Acute pancreatitis Aspiration PNA Need to rule out intraabdominal infection  P:   Zosyn Follow cultures  ENDOCRINE A:   Hyperglycemia  P:   Insulin infusion CBG hourly  NEUROLOGIC A:  Acute metabolic vs anoxic encephalopathy: multifactorial - withdrawal, uremia, hepatic At risk ETOH/benzo withdrawal P:   Fentanyl/Versed infusions RASS goal -1 to -2 Monitor Daily WUA  FAMILY  - Updates: No family at bedside  - Inter-disciplinary family meet or Palliative Care meeting due by: 3/25   Georgann Housekeeper, AGACNP-BC [facility] Pager [phone] or [phone]  03/24/2016 8:39 AM  Attending Note:  I have examined patient, reviewed labs, studies and notes. I have discussed the case with Jaclynn Guarneri, and I agree with the data and plans as amended above. 56 year old alcoholic with a history of GI bleed in the past, found unresponsive and admitted with acute renal failure. He experienced massive coffee ground emesis and a subsequent brief cardiac arrest on 03/30/2016. He was urgently intubated and supported aggressively, transferred to the ICU. Gastroenterology evaluation showed gastritis and esophagitis without any evidence of  variceal bleeding. On evaluation today he does wake up with stimulation, was very agitated on his wakeup assessment earlier today. He is obese, has distant breath sounds. Tachycardic regular heart sounds. He has 1+ lower extremity edema. We will continue his current level of support with mechanical ventilation, wean PEEP and FiO2 as able. We are in the process of weaning his pressors-noticed that these were weaned significantly when he was also day she but were added back when an adequate sedation was reinitiated. Nephrology is following him. No current indication to initiate continuous dialysis but this may become necessary. Continue to follow CBC, give blood products as indicated. Continue Protonix drip. Independent critical care time is 35 minutes.   Baltazar Apo, MD, PhD 03/24/2016, 2:36 PM [place] [phone] or if no answer [phone]

## 2016-03-24 NOTE — Progress Notes (Signed)
Subjective:  Admitted to ICU- intubated for airway protection- on pressors but thankfully kidney wise he started to make urine and numbers look better today- HD cath was not placed and did not need to start CRRT- had EGD- severe esophagitis- no varices- no intervention needed Objective Vital signs in last 24 hours: Vitals:   03/24/16 0725 03/24/16 0730 03/24/16 0745 03/24/16 0800  BP: 110/72   114/66  Pulse: (!) 104 (!) 104 98 (!) 103  Resp: (!) 35 (!) 23 (!) 35 (!) 35  Temp:   99.1 F (37.3 C)   TempSrc:   Oral   SpO2: 98% 98% 97% 97%  Weight:      Height:       Weight change:   Intake/Output Summary (Last 24 hours) at 03/24/16 0818 Last data filed at 03/24/16 0800  Gross per 24 hour  Intake          8673.74 ml  Output             6040 ml  Net          2633.74 ml   Assessment/Plan: 56 year old WM with AKI in setting of hypotension- possibly on ARB, potassium and NSAIDS presenting with intoxication and what appears to be volume depletion 1.Renal- AKI- renal function normal about 6 weeks ago.  Imaging does not show obstruction.  Urine bland.  Hopefully ATN due to above and will be reversible.   Holding ARB and potassium and give aggressive volume repletion and BP support- now making urine and numbers improving slowly.  No absolute indications for HD- potassium and bicarb better- will continue to follow closely 2. Hypertension/volume  - hypotension with evidence of decreased perfusion- fluid responsive initially - now on pressors- being treated for shock per CCM 3. Anemia  - appears to have GIB- PPI and supportive care- EGD with severe esophagitis no varices- transfuse PRN- octreotide 4. Acidosis- agree with sodium bicarb drip- due to lactate and renal failure - may help K as well- all numbers better 5. Hyperkalemia- stopped supp and ARB- given kaexylate- on bicarb- better with medical management and has not required renal replacement therapy     Jesse Hartman  A    Labs: Basic Metabolic Panel:  Recent Labs Lab 03/13/2016 2047 04/03/2016 2128 03/24/16 0047 03/24/16 0435  NA 128* 127* 127* 130*  K 5.2* 5.2* 4.3 3.6  CL 94* 91* 96* 92*  CO2 21*  --  20* 24  GLUCOSE 233* 228* 263* 275*  BUN 111* 124* 109* 107*  CREATININE 3.73* 3.70* 3.56* 3.41*  CALCIUM 7.1*  --  7.2* 7.0*  PHOS  --   --  5.7* 3.6   Liver Function Tests:  Recent Labs Lab 03/11/2016 1318 03/24/16 0435  AST 93*  --   ALT 51  --   ALKPHOS 48  --   BILITOT 0.9  --   PROT 5.0*  --   ALBUMIN 2.4* 1.9*    Recent Labs Lab 04/01/2016 1805  LIPASE 24  AMYLASE 51    Recent Labs Lab 04/02/2016 1355  AMMONIA 116*   CBC:  Recent Labs Lab 03/24/2016 1318 03/25/2016 1936 03/20/2016 2128 03/24/16 0001 03/24/16 0435  WBC 19.1* 12.6*  --  11.3* 10.9*  NEUTROABS 14.3*  --   --   --   --   HGB 7.5* 5.4* 7.1* 10.4* 9.6*  HCT 21.8* 15.6* 21.0* 29.9* 26.6*  MCV 98.2 98.1  --  91.7 89.3  PLT 251 188  --  214 174   Cardiac Enzymes:  Recent Labs Lab 03/29/2016 1823 03/24/16 0047 03/24/16 0435  CKTOTAL 766*  --   --   TROPONINI  --  0.05* 0.06*   CBG:  Recent Labs Lab 03/11/2016 1319 03/30/2016 2356 03/24/16 0335 03/24/16 0730  GLUCAP 153* 263* 285* 259*    Iron Studies: No results for input(s): IRON, TIBC, TRANSFERRIN, FERRITIN in the last 72 hours. Studies/Results: Ct Abdomen Pelvis Wo Contrast  Result Date: 03/27/2016 CLINICAL DATA:  Coffee-ground emesis EXAM: CT ABDOMEN AND PELVIS WITHOUT CONTRAST TECHNIQUE: Multidetector CT imaging of the abdomen and pelvis was performed following the standard protocol without IV contrast. COMPARISON:  01/19/2013 FINDINGS: Lower chest: Lung bases are clear. No effusions. Heart is normal size. Hepatobiliary: Diffuse fatty infiltration of the liver. Node focal abnormality. Pancreas: Stranding noted around the pancreas compatible with acute pancreatitis. Spleen: No focal abnormality.  Normal size. Adrenals/Urinary Tract: No adrenal  abnormality. No focal renal abnormality. No stones or hydronephrosis. Urinary bladder is unremarkable. Stomach/Bowel: Scattered sigmoid diverticula. No active diverticulitis. Small bowel is decompressed. Stomach is distended with fluid. Vascular/Lymphatic: No evidence of aneurysm or adenopathy. Aortic calcifications. Reproductive: No visible focal abnormality. Other: No free fluid or free air. Small inguinal hernias containing fat. Musculoskeletal: No acute bony abnormality. Postoperative changes from posterior fusion at L4-5. Degenerative disc disease throughout the lumbar spine. IMPRESSION: Stranding around the pancreas compatible with acute pancreatitis. Fatty infiltration of the liver. Distended, fluid-filled stomach. Electronically Signed   By: Rolm Baptise M.D.   On: 03/22/2016 15:24   Ct Head Wo Contrast  Result Date: 03/31/2016 CLINICAL DATA:  Altered mental status.  Vomiting. EXAM: CT HEAD WITHOUT CONTRAST TECHNIQUE: Contiguous axial images were obtained from the base of the skull through the vertex without intravenous contrast. COMPARISON:  01/14/2016 head CT . FINDINGS: Brain: No evidence of parenchymal hemorrhage or extra-axial fluid collection. No mass lesion, mass effect, or midline shift. No CT evidence of acute infarction. Generalized cerebral volume loss. No ventriculomegaly. Vascular: No hyperdense vessel or unexpected calcification. Skull: No evidence of calvarial fracture. Sinuses/Orbits: Mucoperiosteal thickening in the bilateral ethmoidal air cells. Layering secretions in the dependent left maxillary sinus and left nasopharynx. Other:  The mastoid air cells are unopacified. IMPRESSION: 1.  No evidence of acute intracranial abnormality. 2. Generalized cerebral volume loss. 3. Mild paranasal sinusitis of uncertain acuity . Electronically Signed   By: Ilona Sorrel M.D.   On: 03/26/2016 15:20   Portable Chest Xray  Result Date: 03/24/2016 CLINICAL DATA:  Shortness of breath. EXAM: PORTABLE  CHEST 1 VIEW COMPARISON:  Radiograph March 23, 2016. FINDINGS: Stable cardiomediastinal silhouette. Endotracheal tube is unchanged in position. Nasogastric tube has been removed. Left internal jugular catheter is unchanged. No pneumothorax is noted. Bilateral perihilar and basilar opacities are noted concerning for edema or pneumonia. Old left clavicular fracture is noted. IMPRESSION: Bilateral perihilar and basilar edema or pneumonia is noted. Electronically Signed   By: Marijo Conception, M.D.   On: 03/24/2016 07:16   Dg Chest Port 1 View  Result Date: 03/22/2016 CLINICAL DATA:  Respiratory failure EXAM: PORTABLE CHEST 1 VIEW COMPARISON:  03/25/2016 FINDINGS: Cardiac shadow is stable. Endotracheal tube, nasogastric catheter and left jugular central line are now seen. No pneumothorax is noted. Increased patchy infiltrate is noted in the mid and upper right lung. This may represent a component of asymmetric edema as there is some mild vascular congestion noted. No other focal abnormality is seen. IMPRESSION: Increased vascular congestion and right-sided patchy  infiltrative change which may represent asymmetric edema. Tubes and lines as described without complicating factors. Electronically Signed   By: Inez Catalina M.D.   On: 03/07/2016 21:25   Dg Chest Port 1 View  Result Date: 03/06/2016 CLINICAL DATA:  Altered mental status. Shortness of breath. History of hypertension, COPD and asthma. EXAM: PORTABLE CHEST 1 VIEW COMPARISON:  01/14/2016 and 08/23/2014. FINDINGS: 1321 hour. Low lung volumes with mild elevation of left hemidiaphragm. The heart size and mediastinal contours are stable. The lungs are clear. There is no pleural effusion or pneumothorax. No acute osseous findings are seen. Old left clavicle fracture is partially imaged. IMPRESSION: Suboptimal inspiration. No acute cardiopulmonary process identified. Electronically Signed   By: Richardean Sale M.D.   On: 03/05/2016 13:40    Medications: Infusions: . sodium chloride Stopped (03/26/2016 1729)  . fentaNYL infusion INTRAVENOUS 300 mcg/hr (03/24/16 0800)  . midazolam (VERSED) infusion 4 mg/hr (03/24/16 0800)  . norepinephrine (LEVOPHED) Adult infusion 24.96 mcg/min (03/24/16 0800)  . octreotide  (SANDOSTATIN)    IV infusion 50 mcg/hr (03/24/16 0800)  . pantoprozole (PROTONIX) infusion 8 mg/hr (03/24/16 0800)  . phenylephrine (NEO-SYNEPHRINE) Adult infusion 225.067 mcg/min (03/24/16 0800)  .  sodium bicarbonate  infusion 1000 mL 150 mL/hr at 03/24/16 0800  . vasopressin (PITRESSIN) infusion - *FOR SHOCK* 0.03 Units/min (03/24/16 0800)    Scheduled Medications: . chlorhexidine gluconate (MEDLINE KIT)  15 mL Mouth Rinse BID  . folic acid  1 mg Intravenous Daily  . insulin aspart  0-15 Units Subcutaneous Q4H  . mouth rinse  15 mL Mouth Rinse QID  . [START ON 03/27/2016] pantoprazole  40 mg Intravenous Q12H  . piperacillin-tazobactam (ZOSYN)  IV  3.375 g Intravenous Q8H  . sodium polystyrene  30 g Rectal Once  . thiamine injection  100 mg Intravenous Daily  . vancomycin  1,500 mg Intravenous Q24H    have reviewed scheduled and prn medications.  Physical Exam: General: sedated on vent Heart: tachy Lungs: CBS bilat Abdomen: obese, distended Extremities: minimal edema    03/24/2016,8:18 AM  LOS: 1 day

## 2016-03-25 ENCOUNTER — Inpatient Hospital Stay (HOSPITAL_COMMUNITY): Payer: Medicare Other

## 2016-03-25 ENCOUNTER — Encounter (HOSPITAL_COMMUNITY): Payer: Self-pay | Admitting: Physician Assistant

## 2016-03-25 LAB — GLUCOSE, CAPILLARY
GLUCOSE-CAPILLARY: 119 mg/dL — AB (ref 65–99)
GLUCOSE-CAPILLARY: 147 mg/dL — AB (ref 65–99)
GLUCOSE-CAPILLARY: 147 mg/dL — AB (ref 65–99)
GLUCOSE-CAPILLARY: 154 mg/dL — AB (ref 65–99)
GLUCOSE-CAPILLARY: 169 mg/dL — AB (ref 65–99)
GLUCOSE-CAPILLARY: 172 mg/dL — AB (ref 65–99)
GLUCOSE-CAPILLARY: 173 mg/dL — AB (ref 65–99)
GLUCOSE-CAPILLARY: 177 mg/dL — AB (ref 65–99)
Glucose-Capillary: 145 mg/dL — ABNORMAL HIGH (ref 65–99)
Glucose-Capillary: 131 mg/dL — ABNORMAL HIGH (ref 65–99)
Glucose-Capillary: 173 mg/dL — ABNORMAL HIGH (ref 65–99)

## 2016-03-25 LAB — BLOOD GAS, ARTERIAL
Acid-Base Excess: 9.3 mmol/L — ABNORMAL HIGH (ref 0.0–2.0)
Bicarbonate: 35.3 mmol/L — ABNORMAL HIGH (ref 20.0–28.0)
DRAWN BY: 252031
FIO2: 100
MECHVT: 620 mL
O2 Saturation: 97.8 %
PATIENT TEMPERATURE: 100
PCO2 ART: 70.6 mmHg — AB (ref 32.0–48.0)
PEEP/CPAP: 8 cmH2O
PO2 ART: 118 mmHg — AB (ref 83.0–108.0)
RATE: 24 resp/min
pH, Arterial: 7.324 — ABNORMAL LOW (ref 7.350–7.450)

## 2016-03-25 LAB — CBC
HCT: 23.8 % — ABNORMAL LOW (ref 39.0–52.0)
HEMATOCRIT: 22.6 % — AB (ref 39.0–52.0)
HEMATOCRIT: 23.6 % — AB (ref 39.0–52.0)
HEMOGLOBIN: 7.9 g/dL — AB (ref 13.0–17.0)
Hemoglobin: 7.8 g/dL — ABNORMAL LOW (ref 13.0–17.0)
Hemoglobin: 8.4 g/dL — ABNORMAL LOW (ref 13.0–17.0)
MCH: 31.5 pg (ref 26.0–34.0)
MCH: 31.8 pg (ref 26.0–34.0)
MCH: 32.2 pg (ref 26.0–34.0)
MCHC: 33.5 g/dL (ref 30.0–36.0)
MCHC: 34.5 g/dL (ref 30.0–36.0)
MCHC: 35.3 g/dL (ref 30.0–36.0)
MCV: 91.2 fL (ref 78.0–100.0)
MCV: 94 fL (ref 78.0–100.0)
MCV: 92.2 fL (ref 78.0–100.0)
PLATELETS: 152 10*3/uL (ref 150–400)
Platelets: 142 10*3/uL — ABNORMAL LOW (ref 150–400)
Platelets: 156 10*3/uL (ref 150–400)
RBC: 2.45 MIL/uL — ABNORMAL LOW (ref 4.22–5.81)
RBC: 2.51 MIL/uL — AB (ref 4.22–5.81)
RBC: 2.61 MIL/uL — AB (ref 4.22–5.81)
RDW: 16.5 % — ABNORMAL HIGH (ref 11.5–15.5)
RDW: 16.6 % — ABNORMAL HIGH (ref 11.5–15.5)
RDW: 16.9 % — AB (ref 11.5–15.5)
WBC: 12.4 10*3/uL — ABNORMAL HIGH (ref 4.0–10.5)
WBC: 11.2 10*3/uL — AB (ref 4.0–10.5)
WBC: 15.3 10*3/uL — ABNORMAL HIGH (ref 4.0–10.5)

## 2016-03-25 LAB — BASIC METABOLIC PANEL
ANION GAP: 10 (ref 5–15)
Anion gap: 12 (ref 5–15)
Anion gap: 9 (ref 5–15)
BUN: 65 mg/dL — ABNORMAL HIGH (ref 6–20)
BUN: 73 mg/dL — AB (ref 6–20)
BUN: 87 mg/dL — ABNORMAL HIGH (ref 6–20)
CHLORIDE: 96 mmol/L — AB (ref 101–111)
CHLORIDE: 99 mmol/L — AB (ref 101–111)
CO2: 30 mmol/L (ref 22–32)
CO2: 33 mmol/L — ABNORMAL HIGH (ref 22–32)
CO2: 34 mmol/L — AB (ref 22–32)
CREATININE: 2.82 mg/dL — AB (ref 0.61–1.24)
Calcium: 7.2 mg/dL — ABNORMAL LOW (ref 8.9–10.3)
Calcium: 7.3 mg/dL — ABNORMAL LOW (ref 8.9–10.3)
Calcium: 7.5 mg/dL — ABNORMAL LOW (ref 8.9–10.3)
Chloride: 98 mmol/L — ABNORMAL LOW (ref 101–111)
Creatinine, Ser: 2.67 mg/dL — ABNORMAL HIGH (ref 0.61–1.24)
Creatinine, Ser: 2.67 mg/dL — ABNORMAL HIGH (ref 0.61–1.24)
GFR calc Af Amer: 30 mL/min — ABNORMAL LOW (ref >60)
GFR calc non Af Amer: 25 mL/min — ABNORMAL LOW (ref >60)
GFR, EST AFRICAN AMERICAN: 28 mL/min — AB (ref >60)
GFR, EST AFRICAN AMERICAN: 30 mL/min — AB (ref >60)
GFR, EST NON AFRICAN AMERICAN: 24 mL/min — AB (ref >60)
GFR, EST NON AFRICAN AMERICAN: 25 mL/min — AB (ref >60)
GLUCOSE: 160 mg/dL — AB (ref 65–99)
Glucose, Bld: 158 mg/dL — ABNORMAL HIGH (ref 65–99)
Glucose, Bld: 183 mg/dL — ABNORMAL HIGH (ref 65–99)
POTASSIUM: 3.7 mmol/L (ref 3.5–5.1)
POTASSIUM: 3.6 mmol/L (ref 3.5–5.1)
Potassium: 3.7 mmol/L (ref 3.5–5.1)
SODIUM: 138 mmol/L (ref 135–145)
Sodium: 141 mmol/L (ref 135–145)
Sodium: 142 mmol/L (ref 135–145)

## 2016-03-25 LAB — POCT I-STAT 3, ART BLOOD GAS (G3+)
Acid-Base Excess: 11 mmol/L — ABNORMAL HIGH (ref 0.0–2.0)
Bicarbonate: 35.8 mmol/L — ABNORMAL HIGH (ref 20.0–28.0)
O2 SAT: 99 %
PCO2 ART: 48.5 mmHg — AB (ref 32.0–48.0)
PH ART: 7.48 — AB (ref 7.350–7.450)
PO2 ART: 114 mmHg — AB (ref 83.0–108.0)
Patient temperature: 100
TCO2: 37 mmol/L (ref 0–100)

## 2016-03-25 LAB — PROCALCITONIN: Procalcitonin: 6.16 ng/mL

## 2016-03-25 LAB — MAGNESIUM: Magnesium: 1.9 mg/dL (ref 1.7–2.4)

## 2016-03-25 MED ORDER — DEXTROSE 50 % IV SOLN
25.0000 mL | INTRAVENOUS | Status: DC | PRN
Start: 2016-03-25 — End: 2016-03-25

## 2016-03-25 MED ORDER — ACETAMINOPHEN 325 MG PO TABS: 650.0000 mg | ORAL_TABLET | Freq: Four times a day (QID) | ORAL | Status: DC | PRN

## 2016-03-25 MED ORDER — SODIUM CHLORIDE 0.9 % IV SOLN
INTRAVENOUS | Status: DC
  Filled 2016-03-25: qty 2.5

## 2016-03-25 MED ORDER — FUROSEMIDE 10 MG/ML IJ SOLN
40.0000 mg | Freq: Once | INTRAMUSCULAR | Status: AC
  Administered 2016-03-25: 40 mg via INTRAVENOUS
  Filled 2016-03-25: qty 4

## 2016-03-25 MED ORDER — FUROSEMIDE 10 MG/ML IJ SOLN
INTRAMUSCULAR | Status: AC
  Filled 2016-03-25: qty 4

## 2016-03-25 MED ORDER — INSULIN ASPART 100 UNIT/ML ~~LOC~~ SOLN
0.0000 [IU] | SUBCUTANEOUS | Status: DC
  Administered 2016-03-25 (×4): 3 [IU] via SUBCUTANEOUS

## 2016-03-25 MED ORDER — SODIUM CHLORIDE 0.9 % IV SOLN: INTRAVENOUS | Status: DC

## 2016-03-25 MED ORDER — INSULIN REGULAR BOLUS VIA INFUSION
0.0000 [IU] | Freq: Three times a day (TID) | INTRAVENOUS | Status: DC
  Filled 2016-03-25: qty 10

## 2016-03-25 MED ORDER — INSULIN ASPART 100 UNIT/ML ~~LOC~~ SOLN
0.0000 [IU] | SUBCUTANEOUS | Status: DC
  Administered 2016-03-25 – 2016-03-26 (×2): 3 [IU] via SUBCUTANEOUS
  Administered 2016-03-26: 2 [IU] via SUBCUTANEOUS
  Administered 2016-03-26 (×2): 3 [IU] via SUBCUTANEOUS
  Administered 2016-03-26: 2 [IU] via SUBCUTANEOUS
  Administered 2016-03-26 – 2016-03-27 (×2): 3 [IU] via SUBCUTANEOUS
  Administered 2016-03-27: 2 [IU] via SUBCUTANEOUS
  Administered 2016-03-27 (×2): 3 [IU] via SUBCUTANEOUS
  Administered 2016-03-27: 2 [IU] via SUBCUTANEOUS
  Administered 2016-03-27 – 2016-03-28 (×2): 3 [IU] via SUBCUTANEOUS
  Administered 2016-03-28: 2 [IU] via SUBCUTANEOUS
  Administered 2016-03-28 – 2016-03-30 (×13): 3 [IU] via SUBCUTANEOUS
  Administered 2016-03-31 (×5): 2 [IU] via SUBCUTANEOUS
  Administered 2016-03-31: 3 [IU] via SUBCUTANEOUS
  Administered 2016-03-31 – 2016-04-02 (×7): 2 [IU] via SUBCUTANEOUS
  Administered 2016-04-02: 3 [IU] via SUBCUTANEOUS
  Administered 2016-04-02 – 2016-04-03 (×3): 2 [IU] via SUBCUTANEOUS
  Administered 2016-04-03: 1 [IU] via SUBCUTANEOUS
  Administered 2016-04-03 (×2): 2 [IU] via SUBCUTANEOUS
  Administered 2016-04-03 – 2016-04-04 (×2): 3 [IU] via SUBCUTANEOUS
  Administered 2016-04-04 – 2016-04-05 (×5): 2 [IU] via SUBCUTANEOUS
  Administered 2016-04-05: 3 [IU] via SUBCUTANEOUS
  Administered 2016-04-06 – 2016-04-07 (×11): 2 [IU] via SUBCUTANEOUS
  Administered 2016-04-08: 3 [IU] via SUBCUTANEOUS
  Administered 2016-04-08: 5 [IU] via SUBCUTANEOUS
  Administered 2016-04-08: 3 [IU] via SUBCUTANEOUS
  Administered 2016-04-08: 5 [IU] via SUBCUTANEOUS
  Administered 2016-04-08 – 2016-04-09 (×2): 3 [IU] via SUBCUTANEOUS
  Administered 2016-04-09 (×2): 2 [IU] via SUBCUTANEOUS

## 2016-03-25 MED ORDER — ACETAMINOPHEN 650 MG RE SUPP
650.0000 mg | Freq: Four times a day (QID) | RECTAL | Status: DC | PRN
  Administered 2016-03-25: 650 mg via RECTAL
  Filled 2016-03-25: qty 1

## 2016-03-25 MED ORDER — IPRATROPIUM-ALBUTEROL 0.5-2.5 (3) MG/3ML IN SOLN
3.0000 mL | Freq: Four times a day (QID) | RESPIRATORY_TRACT | Status: DC
  Administered 2016-03-25 – 2016-04-09 (×59): 3 mL via RESPIRATORY_TRACT
  Filled 2016-03-25 (×58): qty 3

## 2016-03-25 MED ORDER — FUROSEMIDE 10 MG/ML IJ SOLN
40.0000 mg | Freq: Once | INTRAMUSCULAR | Status: AC
  Administered 2016-03-25: 40 mg via INTRAVENOUS

## 2016-03-25 MED FILL — Medication: Qty: 1 | Status: AC

## 2016-03-25 NOTE — Progress Notes (Signed)
Subjective:  BP seems better but still requiring pressors- made over 5 liters of urine and kidney numbers much better.   Objective Vital signs in last 24 hours: Vitals:   03/25/16 0730 03/25/16 0741 03/25/16 0745 03/25/16 0800  BP: 127/65   98/87  Pulse: (!) 111  (!) 113 (!) 113  Resp: (!) 24  (!) 27 (!) 27  Temp:  (!) 102.4 F (39.1 C)    TempSrc:  Axillary    SpO2: 93%  93% 93%  Weight:      Height:       Weight change: 0.557 kg (1 lb 3.6 oz)  Intake/Output Summary (Last 24 hours) at 03/25/16 2025 Last data filed at 03/25/16 0800  Gross per 24 hour  Intake          6797.94 ml  Output             5450 ml  Net          1347.94 ml   Assessment/Plan: 56 year old WM with AKI in setting of hypotension- possibly on ARB, potassium and NSAIDS presenting with intoxication and what appears to be volume depletion 1.Renal- AKI- renal function normal about 6 weeks ago.  Imaging does not show obstruction.  Urine bland.  Hopefully ATN due to above and will be reversible.   Holding ARB and potassium and give aggressive volume repletion and BP support- now making urine and numbers improving.  No indications for HD- potassium and bicarb better- bicarb drip to stop.  Renal will sign off and leave things in the capable hands of CCM 2. Hypertension/volume  - hypotension with evidence of decreased perfusion- fluid responsive initially - now on pressors- being treated for shock per CCM 3. Anemia  - appears to have GIB- PPI and supportive care- EGD with severe esophagitis no varices- transfuse PRN- octreotide 4. Acidosis- resolved 5. Hyperkalemia- resolved      Jesse Hartman A    Labs: Basic Metabolic Panel:  Recent Labs Lab 03/24/16 0047 03/24/16 0435 03/24/16 0825 03/24/16 1445 03/25/16 0740  NA 127* 130* 133* 137 141  K 4.3 3.6 3.7 3.5 3.7  CL 96* 92* 94* 96* 99*  CO2 20* _0 33*  GLUCOSE 263* 275* 261* 179* 160*  BUN 109* 107* 101* 97* 73*  CREATININE 3.56* 3.41* 3.21*  2.98* 2.67*  CALCIUM 7.2* 7.0* 6.9* 7.0* 7.3*  PHOS 5.7* 3.6  --   --   --    Liver Function Tests:  Recent Labs Lab 03/13/2016 1318 03/24/16 0435  AST 93*  --   ALT 51  --   ALKPHOS 48  --   BILITOT 0.9  --   PROT 5.0*  --   ALBUMIN 2.4* 1.9*    Recent Labs Lab 04/02/2016 1805  LIPASE 24  AMYLASE 51    Recent Labs Lab 03/06/2016 1355  AMMONIA 116*   CBC:  Recent Labs Lab 03/11/2016 1318  03/24/16 0001 03/24/16 0435 03/24/16 0825 03/24/16 1445 03/25/16 0740  WBC 19.1*  < > 11.2*  11.3* 10.9* 12.7* 12.8* 12.4*  NEUTROABS 14.3*  --   --   --   --   --   --   HGB 7.5*  < > 8.4*  10.4* 9.6* 9.2* 8.9* 7.8*  HCT 21.8*  < > 23.8*  29.9* 26.6* 25.9* 24.5* 22.6*  MCV 98.2  < > 91.2  91.7 89.3 88.1 89.4 92.2  PLT 251  < > 152  214 174 174 155 142*  < > =  values in this interval not displayed. Cardiac Enzymes:  Recent Labs Lab 03/16/2016 1823 03/24/16 0047 03/24/16 0435 03/24/16 0825 03/24/16 1445  CKTOTAL 766*  --   --   --   --   TROPONINI  --  0.05* 0.06* 0.07* 0.07*   CBG:  Recent Labs Lab 03/24/16 2357 03/25/16 0105 03/25/16 0223 03/25/16 0402 03/25/16 0652  GLUCAP 161* 154* 147* 145* 119*    Iron Studies: No results for input(s): IRON, TIBC, TRANSFERRIN, FERRITIN in the last 72 hours. Studies/Results: Ct Abdomen Pelvis Wo Contrast  Result Date: 03/07/2016 CLINICAL DATA:  Coffee-ground emesis EXAM: CT ABDOMEN AND PELVIS WITHOUT CONTRAST TECHNIQUE: Multidetector CT imaging of the abdomen and pelvis was performed following the standard protocol without IV contrast. COMPARISON:  01/19/2013 FINDINGS: Lower chest: Lung bases are clear. No effusions. Heart is normal size. Hepatobiliary: Diffuse fatty infiltration of the liver. Node focal abnormality. Pancreas: Stranding noted around the pancreas compatible with acute pancreatitis. Spleen: No focal abnormality.  Normal size. Adrenals/Urinary Tract: No adrenal abnormality. No focal renal abnormality. No stones  or hydronephrosis. Urinary bladder is unremarkable. Stomach/Bowel: Scattered sigmoid diverticula. No active diverticulitis. Small bowel is decompressed. Stomach is distended with fluid. Vascular/Lymphatic: No evidence of aneurysm or adenopathy. Aortic calcifications. Reproductive: No visible focal abnormality. Other: No free fluid or free air. Small inguinal hernias containing fat. Musculoskeletal: No acute bony abnormality. Postoperative changes from posterior fusion at L4-5. Degenerative disc disease throughout the lumbar spine. IMPRESSION: Stranding around the pancreas compatible with acute pancreatitis. Fatty infiltration of the liver. Distended, fluid-filled stomach. Electronically Signed   By: Kevin  Dover M.D.   On: 03/22/2016 15:24   Ct Head Wo Contrast  Result Date: 03/16/2016 CLINICAL DATA:  Altered mental status.  Vomiting. EXAM: CT HEAD WITHOUT CONTRAST TECHNIQUE: Contiguous axial images were obtained from the base of the skull through the vertex without intravenous contrast. COMPARISON:  01/14/2016 head CT . FINDINGS: Brain: No evidence of parenchymal hemorrhage or extra-axial fluid collection. No mass lesion, mass effect, or midline shift. No CT evidence of acute infarction. Generalized cerebral volume loss. No ventriculomegaly. Vascular: No hyperdense vessel or unexpected calcification. Skull: No evidence of calvarial fracture. Sinuses/Orbits: Mucoperiosteal thickening in the bilateral ethmoidal air cells. Layering secretions in the dependent left maxillary sinus and left nasopharynx. Other:  The mastoid air cells are unopacified. IMPRESSION: 1.  No evidence of acute intracranial abnormality. 2. Generalized cerebral volume loss. 3. Mild paranasal sinusitis of uncertain acuity . Electronically Signed   By: Jason A Poff M.D.   On: 03/14/2016 15:20   Dg Chest Port 1 View  Result Date: 03/25/2016 CLINICAL DATA:  Hypoxia EXAM: PORTABLE CHEST 1 VIEW COMPARISON:  March 24, 2016 FINDINGS: Endotracheal  tube tip is 6.7 cm above the carina. The left central catheter tip is now in the left innominate vein, having been pulled back several cm compared to 1 day prior. No pneumothorax. There has been partial but incomplete clearing of patchy interstitial and alveolar edema. There is focal atelectatic change in the left base which is new. There is cardiomegaly. The pulmonary vascularity suggests mild pulmonary venous hypertension. No adenopathy. No bone lesions. IMPRESSION: Tube and catheter positions as described without pneumothorax. Note that the left central catheter has been pulled back several cm compared to 1 day prior. There is less pulmonary edema compared to 1 day prior. Patchy pulmonary edema does remain. There is cardiomegaly and pulmonary venous hypertension, indicative of a degree of congestive heart failure. New atelectasis in the left base   is noted. Earliest changes of pneumonia in the lung base must be of concern. Electronically Signed   By: William  Woodruff III M.D.   On: 03/25/2016 07:38   Portable Chest Xray  Result Date: 03/24/2016 CLINICAL DATA:  Shortness of breath. EXAM: PORTABLE CHEST 1 VIEW COMPARISON:  Radiograph March 23, 2016. FINDINGS: Stable cardiomediastinal silhouette. Endotracheal tube is unchanged in position. Nasogastric tube has been removed. Left internal jugular catheter is unchanged. No pneumothorax is noted. Bilateral perihilar and basilar opacities are noted concerning for edema or pneumonia. Old left clavicular fracture is noted. IMPRESSION: Bilateral perihilar and basilar edema or pneumonia is noted. Electronically Signed   By: James  Green Jr, M.D.   On: 03/24/2016 07:16   Dg Chest Port 1 View  Result Date: 03/26/2016 CLINICAL DATA:  Respiratory failure EXAM: PORTABLE CHEST 1 VIEW COMPARISON:  03/17/2016 FINDINGS: Cardiac shadow is stable. Endotracheal tube, nasogastric catheter and left jugular central line are now seen. No pneumothorax is noted. Increased patchy  infiltrate is noted in the mid and upper right lung. This may represent a component of asymmetric edema as there is some mild vascular congestion noted. No other focal abnormality is seen. IMPRESSION: Increased vascular congestion and right-sided patchy infiltrative change which may represent asymmetric edema. Tubes and lines as described without complicating factors. Electronically Signed   By: Mark  Lukens M.D.   On: 03/17/2016 21:25   Dg Chest Port 1 View  Result Date: 04/01/2016 CLINICAL DATA:  Altered mental status. Shortness of breath. History of hypertension, COPD and asthma. EXAM: PORTABLE CHEST 1 VIEW COMPARISON:  01/14/2016 and 08/23/2014. FINDINGS: 1321 hour. Low lung volumes with mild elevation of left hemidiaphragm. The heart size and mediastinal contours are stable. The lungs are clear. There is no pleural effusion or pneumothorax. No acute osseous findings are seen. Old left clavicle fracture is partially imaged. IMPRESSION: Suboptimal inspiration. No acute cardiopulmonary process identified. Electronically Signed   By: William  Veazey M.D.   On: 03/20/2016 13:40   Medications: Infusions: . fentaNYL infusion INTRAVENOUS 400 mcg/hr (03/25/16 0800)  . midazolam (VERSED) infusion 4 mg/hr (03/25/16 0800)  . norepinephrine (LEVOPHED) Adult infusion 32 mcg/min (03/25/16 0800)  . pantoprozole (PROTONIX) infusion 8 mg/hr (03/25/16 0800)  . phenylephrine (NEO-SYNEPHRINE) Adult infusion 120 mcg/min (03/25/16 0800)  . vasopressin (PITRESSIN) infusion - *FOR SHOCK* 0.03 Units/min (03/25/16 0800)    Scheduled Medications: . chlorhexidine gluconate (MEDLINE KIT)  15 mL Mouth Rinse BID  . folic acid  1 mg Intravenous Daily  . furosemide  40 mg Intravenous Once  . insulin aspart  0-15 Units Subcutaneous Q4H  . mouth rinse  15 mL Mouth Rinse QID  . [START ON 03/27/2016] pantoprazole  40 mg Intravenous Q12H  . piperacillin-tazobactam (ZOSYN)  IV  3.375 g Intravenous Q8H  . thiamine injection  100  mg Intravenous Daily  . vancomycin  1,500 mg Intravenous Q24H    have reviewed scheduled and prn medications.  Physical Exam: General: sedated on vent Heart: tachy Lungs: CBS bilat Abdomen: obese, distended Extremities: minimal edema    03/25/2016,8:22 AM  LOS: 2 days         

## 2016-03-25 NOTE — Progress Notes (Signed)
PULMONARY / CRITICAL CARE MEDICINE   Name: LISTER BRIZZI MRN: [id number] DOB: 06-15-1960    ADMISSION DATE:  03/13/2016 CONSULTATION DATE:  03/06/2016  REFERRING MD:  Dr. Vanita Panda  CHIEF COMPLAINT:  AMS  HISTORY OF PRESENT ILLNESS:   56 year old male with PMH as below, which includes ETOH abuse (24 pack per day), COPD, Barrett's esophagus, HTN, Seizures, and OSA. He was started on lasix and potassium supplementation approximately 3/12. He was found down by his daughter 3/19 and brought to ED via EMS. She first noticed him to be acting strange 3/17 when he was slurring his words and speaking gibberish on the telephone. Then 3/19 she tried to call him on the phone again and he did not answer so she went to his house and found him lying in his own stool/vomit, which she described as bloody. Upon arrival to ED he was hypotensive, which was slow to respond to IVF bolus. He was able to follow commands, but was confused. Laboratory evaluation significant for serum creatinine 4.2, K 6.2, Sodium 121, BUN 103, Co2 12.4,  and Lactic acid 11. Imaging of head and abd/pelvis unrevealing. While in CT he had episode of projectile vomiting of coffee ground emesis. Despite IVF resuscitation he remained hypotensive and PCCM called to admit.   SUBJECTIVE/Interval:  Remains on multiple pressors.  Sedated but does awaken to voice, not following commands.   VITAL SIGNS: BP (!) 126/112 (BP Location: Right Leg)   Pulse (!) 108   Temp 100 F (37.8 C) (Oral)   Resp 20   Ht 6' (1.829 m)   Wt 132.1 kg (291 lb 3.6 oz)   SpO2 98%   BMI 39.50 kg/m   HEMODYNAMICS: CVP:  [10 mmHg-12 mmHg] 12 mmHg  VENTILATOR SETTINGS: Vent Mode: PRVC FiO2 (%):  [80 %] 80 % Set Rate:  [30 bmp] 30 bmp Vt Set:  [076 mL] 620 mL PEEP:  [8 cmH20] 8 cmH20 Plateau Pressure:  [23 cmH20-28 cmH20] 23 cmH20  INTAKE / OUTPUT: I/O last 3 completed shifts: In: 12716.6 [I.V.:10586.6; Blood:1290; Other:140; IV Piggyback:700] Out: 2263  [FHLKT:6256; Emesis/NG output:1000]  PHYSICAL EXAMINATION:  General:  Obese male in NAD on vent, Neuro:  Sedated, opens eyes to voice but does not follow commands. HEENT:  /AT, PERRL, no JVD, Cardiovascular:  RRR, no MRG, Lungs:  Coarse crackles bilaterally. Abdomen:  Soft, non-distended, protuberant. Musculoskeletal:  No acute deformity. Skin:  Pallor, otherwise intact.  LABS:  BMET  Recent Labs Lab 03/24/16 0435 03/24/16 0825 03/24/16 1445  NA 130* 133* 137  K 3.6 3.7 3.5  CL 92* 94* 96*  CO2 '24 27 28  '$ BUN 107* 101* 97*  CREATININE 3.41* 3.21* 2.98*  GLUCOSE 275* 261* 179*    Electrolytes  Recent Labs Lab 03/24/16 0047 03/24/16 0435 03/24/16 0825 03/24/16 1445  CALCIUM 7.2* 7.0* 6.9* 7.0*  MG 2.3  --   --   --   PHOS 5.7* 3.6  --   --     CBC  Recent Labs Lab 03/24/16 0435 03/24/16 0825 03/24/16 1445  WBC 10.9* 12.7* 12.8*  HGB 9.6* 9.2* 8.9*  HCT 26.6* 25.9* 24.5*  PLT 174 174 155    Coag's  Recent Labs Lab 03/25/2016 1318 03/24/16 0047  APTT  --  28  INR 1.35  --     Sepsis Markers  Recent Labs Lab 03/12/2016 1806 03/26/2016 1823 03/09/2016 2124 03/19/2016 2153 03/24/16 0825  LATICACIDVEN 8.8*  --  2.2* 1.87  --  PROCALCITON  --  4.16  --   --  11.49    ABG  Recent Labs Lab 03/24/16 0339 03/24/16 0728 03/25/16 0232  PHART 7.291* 7.387 7.480*  PCO2ART 51.6* 47.1 48.5*  PO2ART 81.0* 88.2 114.0*    Liver Enzymes  Recent Labs Lab 03/07/2016 1318 03/24/16 0435  AST 93*  --   ALT 51  --   ALKPHOS 48  --   BILITOT 0.9  --   ALBUMIN 2.4* 1.9*    Cardiac Enzymes  Recent Labs Lab 03/24/16 0435 03/24/16 0825 03/24/16 1445  TROPONINI 0.06* 0.07* 0.07*    Glucose  Recent Labs Lab 03/24/16 2151 03/24/16 2259 03/24/16 2357 03/25/16 0105 03/25/16 0223 03/25/16 0402  GLUCAP 156* 158* 161* 154* 147* 145*    Imaging No results found.   STUDIES:  CT head 3/19 > No evidence of acute intracranial abnormality.  Generalized cerebral volume loss. Mild paranasal sinusitis of uncertain acuity . CT abd/pelv 3/19 > Stranding around the pancreas compatible with acute pancreatitis. Fatty infiltration of the liver. Distended, fluid-filled stomach. EGD 3/19 > Severe esophagitis with no acute hemorrhage  CULTURES: Blood 3/19 > Urine 3/19 >  ANTIBIOTICS: Zosyn 3/19 >  SIGNIFICANT EVENTS: 3/19 admit, ARF 3/19 PM cardiac arrest 6 mins, intubated. On pressors.   LINES/TUBES: CVL 3/19 > ETT 3/19 > L fem art line 3/21 >  DISCUSSION: 56 year old male admitted with AMS and AKI. Was improving but suffered massive coffee ground emesis and subsequent brief cardiac arrest. Intubated and on pressors.   ASSESSMENT / PLAN:  PULMONARY A: Acute hypoxemic respiratory failure: suspect he aspirated on emesis, CXR and exam also concerning for mild edema. COPD without acute exacerbation. OSA. P:   Full vent support > increased PEEP this AM again. Daily CXR. ABG follow. Titrate FiO2 for sats > 92%. PRN bronchodilators. '40mg'$  lasix x 1.  CARDIOVASCULAR A:  Shock: hemorrhagic.  CVP 14. H/o HTN, CHF. P:  Consider echo. Continue levophed, phenylephrine, and vaso for MAP > 60 - wean as able. Follow CVP, goal ~10.  RENAL A:   Acute renal failure - presumed ATN in setting dehydration/ARB/NSAIDs/shock. Gradually improving. Hyperkalemia - resolved. AG acidosis secondary to lactic/renal failure - resolved. New met alkalosis 3/21. Hyponatremia dehydration / beer potomania - resolved. P:   D/c bicarb infusion. Trial '40mg'$  lasix x 1 today for exam c/w volume overload.  Nephrology following > hope can recover without HD. BMP q 8 hrs.  GASTROINTESTINAL A:   GIB - severe esophagitis on EGD. No acute bleed so no interventions performed. Acute pancreatitis - per CT but lipase / amylase normal. H/o Barrett's esophagus. P:   NPO. Continue PPI, octreotide.  HEMATOLOGIC A:   ABLA secondary to GIB. P:  Serial  CBC q 8. Transfuse to keep HGB > 7.  INFECTIOUS A:   Aspiration PNA. P:   Continue zosyn. Follow cultures.  ENDOCRINE A:   Hyperglycemia. P:   Transition off insulin gtt (only on 2u currently) and start SSI.  NEUROLOGIC A:   Acute metabolic encephalopathy: multifactorial - withdrawal, uremia, hepatic, sedation. At risk ETOH/benzo withdrawal. P:   Sedation: fentanyl/versed infusions. RASS goal -1 to -2. Daily WUA.  FAMILY  - Updates: No family at bedside  - Inter-disciplinary family meet or Palliative Care meeting due by: 3/25  CC time: 40 min.   Montey Hora, [facility] Pager: [phone]  or [phone] 03/25/2016, 8:04 AM  Attending Note:  I have examined patient, reviewed labs, studies and notes. I have discussed the case with Junius Roads, and I agree with the data and plans as amended above. 56 year old man with a history of alcohol abuse admitted with altered mental status that progressed to obtundation, acute renal failure. He was undergoing evaluation and then experienced massive coffee ground emesis with profound hypotension and a subsequent brief cardiac arrest. He was urgently intubated and supported with blood products. His evaluation thus far has revealed gastritis and esophagitis without any evidence of varices. Postarrest he has remained in shock. There has been some improvement in his pressor needs over the last 24 hours. He has acute renal failure with a serum creatinine that peaked at 3.56, improving. On my evaluation he is sedated. Per report on wake up Assessment this morning he did follow commands but had some associated agitation. Lungs are coarse. He is tachycardic without a murmur. We will continue to attempt to wean his FiO2 and PEEP. Attempt to wean pressors. I suspect that he will continue to have hypertension until we make progress decreasing his sedation. We will hold any enteral feeds, continue Protonix  until advised otherwise by gastroenterology. Independent critical care time is 33 minutes.   Baltazar Apo, MD, PhD 03/25/2016, 1:48 PM [facility] [phone] or if no answer [phone]

## 2016-03-25 NOTE — Progress Notes (Addendum)
Called to patient bedside for progessive hypoxia  Patient currently on 80%, 20 PEEP, and Rate 30 with oxygen saturation 85-88%   Physical Exam  HENT:  ETT in place  Eyes: Pupils are equal, round, and reactive to light.  Cardiovascular: Regular rhythm.  Exam reveals no gallop and no friction rub.   No murmur heard. Tachy  Pulmonary/Chest: He has no wheezes.  Diminished breath sounds throughout, crackles at bases   Neurological:  Sedated, does not follow commands    Acute hypoxemic respiratory distress with pulmonary edema and severe airway obstruction +/- aspiration event Plan -Maintain Oxygenation >88 -CXR now -ABG now -Called Dr. Alva Garnet for further instructions (after CXR and ABG results, Multiple maneuvers with Vent changed to Fi02 to 100%, Peep 8, Rate 24)  -Scheduled Duonebs  -Will obtained repeat ABG at 2115 and follow up with results   CC Time 45 minutes  Jesse Hartman, [facility]  Pgr: [phone]  PCCM Pgr: [phone]

## 2016-03-25 NOTE — Progress Notes (Signed)
RT was unsuccessful with replacing the art-line. RN aware, will notify MD.

## 2016-03-25 NOTE — Progress Notes (Deleted)
eLink Physician-Brief Progress Note Patient Name: Jesse Hartman DOB: April 21, 1960 MRN: [id number]   Date of Service  03/25/2016  HPI/Events of Note  Severe hyperglycemia all day long  eICU Interventions  Insulin infusion ordered     Intervention Category Intermediate Interventions: Hyperglycemia - evaluation and treatment  JONN CHAIKIN 03/25/2016, 8:19 PM

## 2016-03-25 NOTE — Progress Notes (Signed)
Patient CVC noted to be pulled out of sutures. Line pulled out from 18cm previously to now 15cm. Line re-sutured and cleaned. Will follow up on pending CXR.

## 2016-03-25 NOTE — Progress Notes (Signed)
Initial Nutrition Assessment  DOCUMENTATION CODES:   Obesity unspecified  INTERVENTION:   When able to place NG feeding tube / Cortrak, recommend start TF:    Vital High Proten at 75 ml/h (1800 ml per day)  Provides 1800 kcal, 158 gm protein, 1505 ml free water daily  NUTRITION DIAGNOSIS:   Inadequate oral intake related to inability to eat as evidenced by NPO status.  GOAL:   Provide needs based on ASPEN/SCCM guidelines  MONITOR:   Vent status, Labs, I & O's  REASON FOR ASSESSMENT:   Ventilator    ASSESSMENT:   56 year old male admitted with AMS and AKI. Was improving but suffered massive coffee ground emesis and subsequent brief cardiac arrest. Required intubation on 3/19.   Discussed patient in ICU rounds and with RN today. NG/OG tube was removed for EGD and has not been replaced, allowing time for healing of esophagitis before replacing tube. Unable to start any enteral nutrition at this time. Patient is currently intubated on ventilator support MV: 18.2 L/min Temp (24hrs), Avg:101 F (38.3 C), Min:99.5 F (37.5 C), Max:103.1 F (39.5 C)  Labs reviewed. CBG's: [phone] Medications reviewed and include folic acid and thiamine.  Diet Order:  Diet NPO time specified  Skin:  Reviewed, no issues  Last BM:  3/20  Height:   Ht Readings from Last 1 Encounters:  03/24/16 6' (1.829 m)    Weight:   Wt Readings from Last 1 Encounters:  03/25/16 291 lb 3.6 oz (132.1 kg)    Ideal Body Weight:  80.9 kg  BMI:  Body mass index is 39.5 kg/m.  Estimated Nutritional Needs:   Kcal:  1450-1850  Protein:  162 gm  Fluid:  2 L  EDUCATION NEEDS:   No education needs identified at this time  Molli Barrows, [place], [place], [place] Pager [phone] After Hours Pager [phone]

## 2016-03-25 NOTE — Progress Notes (Signed)
RT note- Recruitment maneuver performed due to sp02 decreased to 88-89%. Tolerated well, remains on current settings.

## 2016-03-25 NOTE — Care Management Note (Signed)
Case Management Note  Patient Details  Name: Jesse Hartman MRN: [id number] Date of Birth: 11/26/1960  Subjective/Objective:    Admitted post being found down by daughter            Action/Plan:   PTA from home, excessive alcohol abuse - CSW consulted.  Pt is ventilated.  CM will continue to follow for discharge needs   Expected Discharge Date:                  Expected Discharge Plan:     In-House Referral:  Clinical Social Work  Discharge planning Services  CM Consult  Post Acute Care Choice:    Choice offered to:     DME Arranged:    DME Agency:     HH Arranged:    [place] Agency:     Status of Service:  In process, will continue to follow  If discussed at Long Length of Stay Meetings, dates discussed:    Additional Comments:  [name], RN 03/25/2016, 3:31 PM

## 2016-03-25 NOTE — Procedures (Signed)
Arterial Catheter Insertion Procedure Note Jesse Hartman [id number] May 25, 1960  Procedure: Insertion of Arterial Catheter  Indications: Blood pressure monitoring  Procedure Details Consent: Unable to obtain consent because of emergent medical necessity. Time Out: Verified patient identification, verified procedure, site/side was marked, verified correct patient position, special equipment/implants available, medications/allergies/relevent history reviewed, required imaging and test results available.  Performed  Maximum sterile technique was used including antiseptics, cap, gloves, gown, hand hygiene, mask and sheet. Skin prep: Chlorhexidine; local anesthetic administered 22 gauge catheter was inserted into left femoral artery using the Seldinger technique.  Evaluation Blood flow good; BP tracing good. Complications: No apparent complications.  Hayden Pedro, AG-ACNP [facility]  Pgr: [phone]  PCCM Pgr: [phone]

## 2016-03-25 NOTE — Progress Notes (Addendum)
eLink Physician-Brief Progress Note Patient Name: Jesse Hartman DOB: 03/18/1960 MRN: [id number]   Date of Service  03/25/2016  HPI/Events of Note  Progressive hypoxemia over course of the day despite increasing PEEP from 8-20 cm H2O and FiO2 from 80 to 100%. Peak pressures in mid 40s. Flow curves have appearance of severe airflow obstruction CXR: improved bibasilar aeration with persistent L sided opacification ABG: 7.35/66/59 0n FiO2 100%, PEEP 20 cm H2O  eICU Interventions  Bronchodilators ordered PEEP decreased to 8 cm H2O Rate decreased from 30 to 24/min Try to maintain in R semi- decubitus position (good lung down rule)  After these maneuvers, SpO2 improved from low 80s to low 90s     Intervention Category Major Interventions: Hypoxemia - evaluation and management;Respiratory failure - evaluation and management  PEDRO WHITERS 03/25/2016, 8:00 PM

## 2016-03-25 NOTE — Progress Notes (Signed)
Inpatient Diabetes Program Recommendations  AACE/ADA: New Consensus Statement on Inpatient Glycemic Control (2015)  Target Ranges:  Prepandial:   less than 140 mg/dL      Peak postprandial:   less than 180 mg/dL (1-2 hours)      Critically ill patients:  140 - 180 mg/dL   Lab Results  Component Value Date   GLUCAP 169 (H) 03/25/2016    Review of Glycemic Control:  Results for [facility] (MRN [id number]) as of 03/25/2016 10:07  Ref. Range 03/25/2016 04:02 03/25/2016 05:38 03/25/2016 06:52 03/25/2016 07:40 03/25/2016 08:50  Glucose-Capillary Latest Ref Range: 65 - 99 mg/dL 145 (H) 147 (H) 119 (H) 131 (H) 169 (H)   Inpatient Diabetes Program Recommendations:   Please add Lantus 20 units daily as patient is transitioning off insulin drip.    Thanks, Adah Perl, RN, BC-ADM Inpatient Diabetes Coordinator Pager [phone] (8a-5p)

## 2016-03-25 NOTE — Progress Notes (Signed)
RT was called to pt. Room to assess pt. Aline.  RT discovered the line was was not functioning properly and needed to be pulled. MD notified and MD stated for RT to replace aline.

## 2016-03-26 ENCOUNTER — Inpatient Hospital Stay (HOSPITAL_COMMUNITY): Payer: Medicare Other

## 2016-03-26 DIAGNOSIS — R579 Shock, unspecified: Secondary | ICD-10-CM

## 2016-03-26 LAB — BLOOD GAS, ARTERIAL
Acid-Base Excess: 9.5 mmol/L — ABNORMAL HIGH (ref 0.0–2.0)
Acid-Base Excess: 10.1 mmol/L — ABNORMAL HIGH (ref 0.0–2.0)
Bicarbonate: 35 mmol/L — ABNORMAL HIGH (ref 20.0–28.0)
Bicarbonate: 35.8 mmol/L — ABNORMAL HIGH (ref 20.0–28.0)
Drawn by: 252031
Drawn by: 252031
FIO2: 0.8
FIO2: 100
MECHVT: 620 mL
MECHVT: 620 mL
O2 Saturation: 86.7 %
O2 Saturation: 99.3 %
PATIENT TEMPERATURE: 100
PEEP: 16 cmH2O
PEEP: 8 cmH2O
PO2 ART: 59 mmHg — AB (ref 83.0–108.0)
Patient temperature: 99.5
RATE: 30 resp/min
RATE: 24 resp/min
pCO2 arterial: 65.6 mmHg (ref 32.0–48.0)
pCO2 arterial: 68.4 mmHg (ref 32.0–48.0)
pH, Arterial: 7.352 (ref 7.350–7.450)
pH, Arterial: 7.342 — ABNORMAL LOW (ref 7.350–7.450)
pO2, Arterial: 233 mmHg — ABNORMAL HIGH (ref 83.0–108.0)

## 2016-03-26 LAB — BASIC METABOLIC PANEL
Anion gap: 12 (ref 5–15)
BUN: 61 mg/dL — AB (ref 6–20)
CHLORIDE: 96 mmol/L — AB (ref 101–111)
CO2: 34 mmol/L — ABNORMAL HIGH (ref 22–32)
Calcium: 7.8 mg/dL — ABNORMAL LOW (ref 8.9–10.3)
Creatinine, Ser: 2.57 mg/dL — ABNORMAL HIGH (ref 0.61–1.24)
GFR calc Af Amer: 31 mL/min — ABNORMAL LOW (ref >60)
GFR calc non Af Amer: 27 mL/min — ABNORMAL LOW (ref >60)
Glucose, Bld: 173 mg/dL — ABNORMAL HIGH (ref 65–99)
POTASSIUM: 3.5 mmol/L (ref 3.5–5.1)
SODIUM: 142 mmol/L (ref 135–145)

## 2016-03-26 LAB — GLUCOSE, CAPILLARY
GLUCOSE-CAPILLARY: 158 mg/dL — AB (ref 65–99)
GLUCOSE-CAPILLARY: 148 mg/dL — AB (ref 65–99)
GLUCOSE-CAPILLARY: 153 mg/dL — AB (ref 65–99)
Glucose-Capillary: 146 mg/dL — ABNORMAL HIGH (ref 65–99)
Glucose-Capillary: 169 mg/dL — ABNORMAL HIGH (ref 65–99)
Glucose-Capillary: 180 mg/dL — ABNORMAL HIGH (ref 65–99)

## 2016-03-26 LAB — CBC
HCT: 24.6 % — ABNORMAL LOW (ref 39.0–52.0)
Hemoglobin: 7.9 g/dL — ABNORMAL LOW (ref 13.0–17.0)
MCH: 31.1 pg (ref 26.0–34.0)
MCHC: 32.1 g/dL (ref 30.0–36.0)
MCV: 96.9 fL (ref 78.0–100.0)
Platelets: 171 10*3/uL (ref 150–400)
RBC: 2.54 MIL/uL — ABNORMAL LOW (ref 4.22–5.81)
RDW: 16.7 % — ABNORMAL HIGH (ref 11.5–15.5)
WBC: 20.2 10*3/uL — ABNORMAL HIGH (ref 4.0–10.5)

## 2016-03-26 LAB — URINE CULTURE: CULTURE: NO GROWTH

## 2016-03-26 LAB — PHOSPHORUS: Phosphorus: 4.1 mg/dL (ref 2.5–4.6)

## 2016-03-26 LAB — MAGNESIUM: Magnesium: 1.9 mg/dL (ref 1.7–2.4)

## 2016-03-26 MED ORDER — CHLORHEXIDINE GLUCONATE CLOTH 2 % EX PADS
6.0000 | MEDICATED_PAD | Freq: Every day | CUTANEOUS | Status: DC
  Administered 2016-03-26 – 2016-04-09 (×15): 6 via TOPICAL

## 2016-03-26 MED ORDER — VITAL HIGH PROTEIN PO LIQD
1000.0000 mL | ORAL | Status: DC
  Administered 2016-03-26: 1000 mL
  Administered 2016-03-27 (×2)
  Administered 2016-03-27: 1000 mL
  Administered 2016-03-27 (×2)

## 2016-03-26 MED ORDER — SODIUM CHLORIDE 0.9 % IV SOLN
1500.0000 mg | INTRAVENOUS | Status: DC
  Administered 2016-03-26: 1500 mg via INTRAVENOUS
  Filled 2016-03-26 (×2): qty 1500

## 2016-03-26 MED ORDER — SODIUM CHLORIDE 0.9% FLUSH
10.0000 mL | Freq: Two times a day (BID) | INTRAVENOUS | Status: DC
Start: 2016-03-26 — End: 2016-04-09
  Administered 2016-03-26 – 2016-03-27 (×3): 10 mL
  Administered 2016-03-27: 20 mL
  Administered 2016-03-28: 10 mL
  Administered 2016-03-28 – 2016-03-29 (×2): 40 mL
  Administered 2016-03-29 – 2016-04-08 (×18): 10 mL
  Administered 2016-04-09: 40 mL

## 2016-03-26 MED ORDER — SODIUM CHLORIDE 0.9% FLUSH: 10.0000 mL | INTRAVENOUS | Status: DC | PRN

## 2016-03-26 NOTE — Progress Notes (Signed)
Pharmacy Antibiotic Note  Jesse Hartman is a 56 y.o. male admitted on 03/20/2016 with concern for sepsis in setting of AKI.  On day #4 of zosyn, vancomycin was stopped after 2 days, however one blood culture turned positive with GPC, therefore vancomycin will be resumed until additional blood cultures rule out true infection. Pt is in AKI, with SCr 2.5, eCrCl 40-45 ml/min, making good urine.   Plan: 1. Vancomycin 1500 IV every 24 hours.  Goal trough 15-20 mcg/mL.  2. Cefepime 1 gram IV every 24 hours  3. Monitor renal fx, cultures, VT as needed   Height: 6' (182.9 cm) Weight: 286 lb 13.1 oz (130.1 kg) IBW/kg (Calculated) : 77.6  Temp (24hrs), Avg:101.4 F (38.6 C), Min:98.1 F (36.7 C), Max:104 F (40 C)   Recent Labs Lab 04/03/2016 1328  03/31/2016 1806  04/04/2016 2124  03/11/2016 2153  03/24/16 0825 03/24/16 1445 03/25/16 0740 03/25/16 1525 03/26/16 0350  WBC  --   --   --   < >  --   --   --   < > 12.7* 12.8* 12.4* 15.3* 20.2*  CREATININE  --   < >  --   < >  --   < >  --   < > 3.21* 2.98* 2.67* 2.67* 2.57*  LATICACIDVEN 11.03*  --  8.8*  --  2.2*  --  1.87  --   --   --   --   --   --   < > = values in this interval not displayed.  Estimated Creatinine Clearance: 45.8 mL/min (A) (by C-G formula based on SCr of 2.57 mg/dL (H)).    Allergies  Allergen Reactions  . Food Hives    Yogurt      3/19 zosyn >> 3/19 Cefepime x1 3/19 vancomycin x1, 3/22 >  3/19 BCx: 1/2 GPC  3/19 mrsa pcr: neg 3/19 Urine cx: insignificant growth 3/21 Blood cx: ngtd 3/21 resp cx: reincubated   Hughes Better, PharmD, BCPS Clinical Pharmacist 03/26/2016 10:33 AM

## 2016-03-26 NOTE — Progress Notes (Signed)
RT note-Recruitment done, tolerated well, continue to monitor, sp02 93%.

## 2016-03-26 NOTE — Progress Notes (Signed)
PULMONARY / CRITICAL CARE MEDICINE   Name: Jesse Hartman MRN: [id number] DOB: Jan 11, 1960    ADMISSION DATE:  03/29/2016 CONSULTATION DATE:  04/01/2016  REFERRING MD:  Dr. Vanita Panda  CHIEF COMPLAINT:  AMS  HISTORY OF PRESENT ILLNESS:   56 year old male with PMH as below, which includes ETOH abuse (24 pack per day), COPD, Barrett's esophagus, HTN, Seizures, and OSA. He was started on lasix and potassium supplementation approximately 3/12. He was found down by his daughter 3/19 and brought to ED via EMS. She first noticed him to be acting strange 3/17 when he was slurring his words and speaking gibberish on the telephone. Then 3/19 she tried to call him on the phone again and he did not answer so she went to his house and found him lying in his own stool/vomit, which she described as bloody. Upon arrival to ED he was hypotensive, which was slow to respond to IVF bolus. He was able to follow commands, but was confused. Laboratory evaluation significant for serum creatinine 4.2, K 6.2, Sodium 121, BUN 103, Co2 12.4,  and Lactic acid 11. Imaging of head and abd/pelvis unrevealing. While in CT he had episode of projectile vomiting of coffee ground emesis. Despite IVF resuscitation he remained hypotensive and PCCM called to admit.   SUBJECTIVE/Interval:  Had increased PEEP and FiO2 needs last 24h, note increased basilar infiltrates on CXR.    VITAL SIGNS: BP 125/60   Pulse 90   Temp 98.1 F (36.7 C) (Rectal)   Resp 19   Ht 6' (1.829 m)   Wt 130.1 kg (286 lb 13.1 oz)   SpO2 94%   BMI 38.90 kg/m   HEMODYNAMICS: CVP:  [9 mmHg-15 mmHg] 10 mmHg  VENTILATOR SETTINGS: Vent Mode: PRVC FiO2 (%):  [80 %-100 %] 80 % Set Rate:  [24 bmp-30 bmp] 24 bmp Vt Set:  [258 mL] 620 mL PEEP:  [8 cmH20-20 cmH20] 8 cmH20 Plateau Pressure:  [24 cmH20-30 cmH20] 24 cmH20  INTAKE / OUTPUT: I/O last 3 completed shifts: In: 6342.3 [I.V.:5982.3; Other:160; IV Piggyback:200] Out: 9275 [Urine:9275]  PHYSICAL  EXAMINATION:  General:  Obese, ill appearing, NAD on MV Neuro:  Sedated, movement w stim but not following commands HEENT:  ETT in place, no oral lesion Cardiovascular:  Regular, distant Lungs: bilateral coarse BS, no wheeze Abdomen:  Soft, benign Musculoskeletal: no deformities Skin: no rash  LABS:  BMET  Recent Labs Lab 03/25/16 0740 03/25/16 1525 03/26/16 0350  NA 141 142 142  K 3.7 3.6 3.5  CL 99* 98* 96*  CO2 33* 34* 34*  BUN 73* 65* 61*  CREATININE 2.67* 2.67* 2.57*  GLUCOSE 160* 183* 173*    Electrolytes  Recent Labs Lab 03/24/16 0047 03/24/16 0435  03/25/16 0740 03/25/16 1525 03/26/16 0350  CALCIUM 7.2* 7.0*  < > 7.3* 7.5* 7.8*  MG 2.3  --   --  1.9  --  1.9  PHOS 5.7* 3.6  --   --   --  4.1  < > = values in this interval not displayed.  CBC  Recent Labs Lab 03/25/16 0740 03/25/16 1525 03/26/16 0350  WBC 12.4* 15.3* 20.2*  HGB 7.8* 7.9* 7.9*  HCT 22.6* 23.6* 24.6*  PLT 142* 156 171    Coag's  Recent Labs Lab 03/13/2016 1318 03/24/16 0047  APTT  --  28  INR 1.35  --     Sepsis Markers  Recent Labs Lab 03/28/2016 1806 03/25/2016 1823 03/06/2016 2124 03/14/2016 2153 03/24/16 0825  03/25/16 0740  LATICACIDVEN 8.8*  --  2.2* 1.87  --   --   PROCALCITON  --  4.16  --   --  11.49 6.16    ABG  Recent Labs Lab 03/25/16 1918 03/25/16 2103 03/26/16 0334  PHART 7.352 7.324* 7.342*  PCO2ART 65.6* 70.6* 68.4*  PO2ART 59.0* 118* 233*    Liver Enzymes  Recent Labs Lab 03/24/2016 1318 03/24/16 0435  AST 93*  --   ALT 51  --   ALKPHOS 48  --   BILITOT 0.9  --   ALBUMIN 2.4* 1.9*    Cardiac Enzymes  Recent Labs Lab 03/24/16 0435 03/24/16 0825 03/24/16 1445  TROPONINI 0.06* 0.07* 0.07*    Glucose  Recent Labs Lab 03/25/16 0850 03/25/16 1142 03/25/16 1523 03/25/16 1935 03/25/16 2330 03/26/16 0333  GLUCAP 169* 173* 172* 177* 173* 180*    Imaging Dg Chest Port 1 View  Result Date: 03/26/2016 CLINICAL DATA:   Respiratory failure EXAM: PORTABLE CHEST 1 VIEW COMPARISON:  03/25/2016 FINDINGS: Cardiac shadow is at the upper limits of normal in size. Bibasilar infiltrates are noted which have increased in the interval from the prior study. No sizable effusion is noted. Endotracheal tube and left jugular central line are again noted and stable. No bony abnormality is noted. IMPRESSION: New significant bibasilar infiltrates. Tubes and lines as described. Electronically Signed   By: Inez Catalina M.D.   On: 03/26/2016 07:07   Dg Chest Port 1 View  Result Date: 03/25/2016 CLINICAL DATA:  Respiratory distress EXAM: PORTABLE CHEST 1 VIEW COMPARISON:  03/25/2016 FINDINGS: Cardiomediastinal silhouette is stable. Again noted endotracheal tube with tip 6.6 cm above the carina. Stable left IJ central line with tip in left innominate vein. Central mild vascular congestion and mild perihilar interstitial prominence without convincing pulmonary edema. Streaky left basilar atelectasis or infiltrate. IMPRESSION: Again noted endotracheal tube with tip 6.6 cm above the carina. Stable left IJ central line with tip in left innominate vein. Central mild vascular congestion and mild perihilar interstitial prominence without convincing pulmonary edema. Streaky left basilar atelectasis or infiltrate. Electronically Signed   By: Lahoma Crocker M.D.   On: 03/25/2016 19:46     STUDIES:  CT head 3/19 > No evidence of acute intracranial abnormality. Generalized cerebral volume loss. Mild paranasal sinusitis of uncertain acuity . CT abd/pelv 3/19 > Stranding around the pancreas compatible with acute pancreatitis. Fatty infiltration of the liver. Distended, fluid-filled stomach. EGD 3/19 > Severe esophagitis with no acute hemorrhage  CULTURES: Blood 3/19 >> 1 of 2 GPC >>  Urine 3/19 > insignificant growth Resp 3/21 >> abundant yeast, PMNs >>  Blood 3/21 >>  Urine 3/21 >>   ANTIBIOTICS: vanco 3/19 >> 3/20 vanco 3/22 >>  Zosyn 3/19  >  SIGNIFICANT EVENTS: 3/19 admit, ARF 3/19 PM cardiac arrest 6 mins, intubated. On pressors.   LINES/TUBES: CVL 3/19 > ETT 3/19 > L fem art line 3/21 >  DISCUSSION: 56 year old male admitted with AMS and AKI. Was improving but suffered massive coffee ground emesis and subsequent brief cardiac arrest. Intubated and on pressors.   ASSESSMENT / PLAN:  PULMONARY A: Acute hypoxemic respiratory failure: suspect he aspirated on emesis, CXR and exam also concerning for mild edema. COPD without acute exacerbation. OSA. Suspected LLL PNA (? Aspiration, CAP) P:   Consider transition to ARDS protocol if bilateral infiltrates evolve; CXR 3/21 showed R side clear, but now R basilar infiltrate present Exhibits a prolonged exp phase that may  make decreasing Vt challenging, may actually benefit from decreasing RR Follow CXR Defer diuretics today 3/22 Follow ABG Agree with scheduling BDs  CARDIOVASCULAR A:  Shock: hemorrhagic, +/- septic  H/o HTN, CHF. P:  Consider TTE depending on progress Wean norepi as able, continue vasopressin for now. Phenylephrine off.  Follow CVP, goal 10-12  RENAL A:   Acute renal failure - presumed ATN in setting dehydration/ARB/NSAIDs/shock. Improved Hyperkalemia - resolved. AG acidosis secondary to lactic/renal failure - resolved. New met alkalosis 3/21. Hyponatremia dehydration / beer potomania - resolved. P:   Follow UOP and BMP Defer lasix 3/22 Appreciate Nephrology assistance, now signed off.   GASTROINTESTINAL A:   GIB - severe esophagitis on EGD. No acute bleed so no interventions performed. Acute pancreatitis - per CT but lipase / amylase normal, suspect more chronic . H/o Barrett's esophagus. P:   Consider start TF soon Following CBC as above  HEMATOLOGIC A:   ABLA secondary to GIB, stabilized  P:  Follow CBC Transfusion goal Hgb > 7  INFECTIOUS A:   Aspiration PNA, LLL Yeast resp cx, unclear significance  GPC 1 of 2 blood cx,  repeat negative so far P:   Continue zosyn. Add back vanco 3/22 given overall status, CXR and GPC blood cx (probably a contaminant)  Continue to follow cx data  ENDOCRINE A:   Hyperglycemia. P:   continue SSI per protocol  NEUROLOGIC A:   Acute metabolic encephalopathy: multifactorial - withdrawal, uremia, hepatic, sedation. At risk ETOH/benzo withdrawal. P:   Sedation: fentanyl/versed infusions. RASS goal -1 to -2. Start decreasing fentanyl 3/22, currently on 400/h  FAMILY  - Updates: Dr Lamonte Sakai updated daughter at bedside 3/22  - Inter-disciplinary family meet or Palliative Care meeting due by: 3/25  Independent CC time 6 minutes  Baltazar Apo, MD, PhD 03/26/2016, 9:53 AM [facility] [phone] or if no answer [phone]

## 2016-03-26 NOTE — Progress Notes (Signed)
RT note- recruitment maneuver performed.

## 2016-03-26 NOTE — Progress Notes (Signed)
RT note-Recruitment performed, chest vest, patient turned, nebulizer given at this time.

## 2016-03-26 NOTE — Progress Notes (Signed)
Nutrition Follow-up  DOCUMENTATION CODES:   Obesity unspecified  INTERVENTION:   Start trickle TF via Cortrak tube:    Vital High Proten at 20 ml/h (480 ml per day)  Provides 480 kcal, 42 gm protein, 401 ml free water daily   When TF tolerance established, goal rate for Vital High Protein is 75 ml/h (1800 ml per day)  Provides 1800 kcal, 158 gm protein, 1505 ml free water daily  NUTRITION DIAGNOSIS:   Inadequate oral intake related to inability to eat as evidenced by NPO status.  Ongoing  GOAL:   Provide needs based on ASPEN/SCCM guidelines  Progressing  MONITOR:   Vent status, Labs, I & O's  REASON FOR ASSESSMENT:   Consult Enteral/tube feeding initiation and management  ASSESSMENT:   56 year old male admitted with AMS and AKI. Was improving but suffered massive coffee ground emesis and subsequent brief cardiac arrest. Required intubation on 3/19.   Cortrak tube has been placed. Received MD Consult for TF initiation and management, trickle TF to begin today. Patient is currently intubated on ventilator support MV: 18.2 L/min Temp (24hrs), Avg:101.2 F (38.4 C), Min:98.1 F (36.7 C), Max:104 F (40 C)  Labs reviewed. CBG's: [phone] Medications reviewed and include folic acid and thiamine.  Diet Order:  Diet NPO time specified  Skin:  Reviewed, no issues  Last BM:  3/20  Height:   Ht Readings from Last 1 Encounters:  03/24/16 6' (1.829 m)    Weight:   Wt Readings from Last 1 Encounters:  03/26/16 286 lb 13.1 oz (130.1 kg)    Ideal Body Weight:  80.9 kg  BMI:  Body mass index is 38.9 kg/m.  Estimated Nutritional Needs:   Kcal:  1450-1850  Protein:  162 gm  Fluid:  2 L  EDUCATION NEEDS:   No education needs identified at this time  Molli Barrows, [place], [place], [place] Pager [phone] After Hours Pager [phone]

## 2016-03-27 ENCOUNTER — Inpatient Hospital Stay (HOSPITAL_COMMUNITY): Payer: Medicare Other

## 2016-03-27 DIAGNOSIS — A419 Sepsis, unspecified organism: Secondary | ICD-10-CM

## 2016-03-27 DIAGNOSIS — R6521 Severe sepsis with septic shock: Secondary | ICD-10-CM

## 2016-03-27 DIAGNOSIS — K2921 Alcoholic gastritis with bleeding: Secondary | ICD-10-CM

## 2016-03-27 LAB — TYPE AND SCREEN
ABO/RH(D): A POS
Antibody Screen: NEGATIVE
UNIT DIVISION: 0
UNIT DIVISION: 0
Unit division: 0
Unit division: 0
Unit division: 0
Unit division: 0

## 2016-03-27 LAB — GLUCOSE, CAPILLARY
GLUCOSE-CAPILLARY: 150 mg/dL — AB (ref 65–99)
GLUCOSE-CAPILLARY: 168 mg/dL — AB (ref 65–99)
GLUCOSE-CAPILLARY: 154 mg/dL — AB (ref 65–99)
GLUCOSE-CAPILLARY: 156 mg/dL — AB (ref 65–99)
Glucose-Capillary: 136 mg/dL — ABNORMAL HIGH (ref 65–99)
Glucose-Capillary: 160 mg/dL — ABNORMAL HIGH (ref 65–99)

## 2016-03-27 LAB — BLOOD GAS, ARTERIAL
Acid-Base Excess: 14.2 mmol/L — ABNORMAL HIGH (ref 0.0–2.0)
BICARBONATE: 38.8 mmol/L — AB (ref 20.0–28.0)
Drawn by: 252031
FIO2: 50
MECHVT: 620 mL
O2 SAT: 92.4 %
PATIENT TEMPERATURE: 98.6
PCO2 ART: 53.3 mmHg — AB (ref 32.0–48.0)
PEEP/CPAP: 8 cmH2O
PO2 ART: 62.4 mmHg — AB (ref 83.0–108.0)
RATE: 24 resp/min
pH, Arterial: 7.476 — ABNORMAL HIGH (ref 7.350–7.450)

## 2016-03-27 LAB — COMPREHENSIVE METABOLIC PANEL
ALBUMIN: 1.5 g/dL — AB (ref 3.5–5.0)
ALT: 287 U/L — ABNORMAL HIGH (ref 17–63)
ANION GAP: 11 (ref 5–15)
AST: 183 U/L — ABNORMAL HIGH (ref 15–41)
Alkaline Phosphatase: 57 U/L (ref 38–126)
BUN: 49 mg/dL — ABNORMAL HIGH (ref 6–20)
CO2: 35 mmol/L — ABNORMAL HIGH (ref 22–32)
Calcium: 8 mg/dL — ABNORMAL LOW (ref 8.9–10.3)
Chloride: 101 mmol/L (ref 101–111)
Creatinine, Ser: 2 mg/dL — ABNORMAL HIGH (ref 0.61–1.24)
GFR calc Af Amer: 42 mL/min — ABNORMAL LOW (ref >60)
GFR calc non Af Amer: 36 mL/min — ABNORMAL LOW (ref >60)
GLUCOSE: 156 mg/dL — AB (ref 65–99)
POTASSIUM: 3.4 mmol/L — AB (ref 3.5–5.1)
Sodium: 147 mmol/L — ABNORMAL HIGH (ref 135–145)
Total Bilirubin: 1.3 mg/dL — ABNORMAL HIGH (ref 0.3–1.2)
Total Protein: 4.8 g/dL — ABNORMAL LOW (ref 6.5–8.1)

## 2016-03-27 LAB — BPAM RBC
BLOOD PRODUCT EXPIRATION DATE: 201804122359
BLOOD PRODUCT EXPIRATION DATE: 201804122359
Blood Product Expiration Date: 201803232359
Blood Product Expiration Date: 201803272359
Blood Product Expiration Date: 201804112359
Blood Product Expiration Date: 201804112359
ISSUE DATE / TIME: 201803191849
ISSUE DATE / TIME: 201803191911
ISSUE DATE / TIME: 201803192045
ISSUE DATE / TIME: 201803192154
UNIT TYPE AND RH: 6200
Unit Type and Rh: 6200
Unit Type and Rh: 6200
Unit Type and Rh: 6200
Unit Type and Rh: 9500
Unit Type and Rh: 9500

## 2016-03-27 LAB — CBC
HCT: 24.9 % — ABNORMAL LOW (ref 39.0–52.0)
HEMATOCRIT: 20.8 % — AB (ref 39.0–52.0)
HEMOGLOBIN: 6.7 g/dL — AB (ref 13.0–17.0)
HEMOGLOBIN: 8.1 g/dL — AB (ref 13.0–17.0)
MCH: 31.5 pg (ref 26.0–34.0)
MCH: 31.9 pg (ref 26.0–34.0)
MCHC: 32.2 g/dL (ref 30.0–36.0)
MCHC: 32.5 g/dL (ref 30.0–36.0)
MCV: 97.7 fL (ref 78.0–100.0)
MCV: 98 fL (ref 78.0–100.0)
PLATELETS: 151 10*3/uL (ref 150–400)
Platelets: 143 10*3/uL — ABNORMAL LOW (ref 150–400)
RBC: 2.13 MIL/uL — ABNORMAL LOW (ref 4.22–5.81)
RBC: 2.54 MIL/uL — AB (ref 4.22–5.81)
RDW: 16.5 % — ABNORMAL HIGH (ref 11.5–15.5)
RDW: 17.5 % — ABNORMAL HIGH (ref 11.5–15.5)
WBC: 15.1 10*3/uL — AB (ref 4.0–10.5)
WBC: 15.4 10*3/uL — AB (ref 4.0–10.5)

## 2016-03-27 LAB — CULTURE, RESPIRATORY

## 2016-03-27 LAB — CULTURE, BLOOD (ROUTINE X 2)

## 2016-03-27 LAB — HEMOGLOBIN AND HEMATOCRIT, BLOOD
HCT: 20.5 % — ABNORMAL LOW (ref 39.0–52.0)
HEMATOCRIT: 23.9 % — AB (ref 39.0–52.0)
HEMOGLOBIN: 6.7 g/dL — AB (ref 13.0–17.0)
HEMOGLOBIN: 7.6 g/dL — AB (ref 13.0–17.0)

## 2016-03-27 LAB — PREPARE RBC (CROSSMATCH)

## 2016-03-27 LAB — CULTURE, RESPIRATORY W GRAM STAIN

## 2016-03-27 MED ORDER — SODIUM CHLORIDE 0.9 % IV SOLN
Freq: Once | INTRAVENOUS | Status: AC
  Administered 2016-03-27: 09:00:00 via INTRAVENOUS

## 2016-03-27 MED ORDER — VITAL HIGH PROTEIN PO LIQD
1000.0000 mL | ORAL | Status: DC
  Administered 2016-03-27 (×3)
  Administered 2016-03-27: 1000 mL
  Administered 2016-03-28 (×3)
  Administered 2016-03-28: 1000 mL
  Administered 2016-03-28 (×8)
  Administered 2016-03-28: 1000 mL
  Administered 2016-03-28 – 2016-03-29 (×4)
  Administered 2016-03-29: 1000 mL
  Administered 2016-03-29 (×4)
  Administered 2016-03-29: 1000 mL
  Administered 2016-03-29: 13:00:00
  Administered 2016-03-30: 1000 mL

## 2016-03-27 MED ORDER — FREE WATER
200.0000 mL | Freq: Three times a day (TID) | Status: DC
  Administered 2016-03-27 – 2016-03-28 (×4): 200 mL

## 2016-03-27 NOTE — Progress Notes (Signed)
PULMONARY / CRITICAL CARE MEDICINE   Name: Jesse Hartman MRN: [id number] DOB: Jun 06, 1960    ADMISSION DATE:  03/14/2016 CONSULTATION DATE:  03/07/2016  REFERRING MD:  Dr. Vanita Panda  CHIEF COMPLAINT:  AMS  HISTORY OF PRESENT ILLNESS:   56 year old male with PMH as below, which includes ETOH abuse (24 pack per day), COPD, Barrett's esophagus, HTN, Seizures, and OSA. He was started on lasix and potassium supplementation approximately 3/12. He was found down by his daughter 3/19 and brought to ED via EMS. She first noticed him to be acting strange 3/17 when he was slurring his words and speaking gibberish on the telephone. Then 3/19 she tried to call him on the phone again and he did not answer so she went to his house and found him lying in his own stool/vomit, which she described as bloody. Upon arrival to ED he was hypotensive, which was slow to respond to IVF bolus. He was able to follow commands, but was confused. Laboratory evaluation significant for serum creatinine 4.2, K 6.2, Sodium 121, BUN 103, Co2 12.4,  and Lactic acid 11. Imaging of head and abd/pelvis unrevealing. While in CT he had episode of projectile vomiting of coffee ground emesis. Despite IVF resuscitation he remained hypotensive and PCCM called to admit.   SUBJECTIVE/Interval:  No hemoglobin dropped to 6.7 this morning, confirmed on recheck Some purposeful movement with decrease in sedation reported by nursing today Remains on low-dose norepi plus vasopressin  VITAL SIGNS: BP 140/65   Pulse 81   Temp 97.5 F (36.4 C) (Oral)   Resp (!) 24   Ht 6' (1.829 m)   Wt 129.1 kg (284 lb 9.8 oz)   SpO2 94%   BMI 38.60 kg/m   HEMODYNAMICS: CVP:  [12 mmHg] 12 mmHg  VENTILATOR SETTINGS: Vent Mode: PRVC FiO2 (%):  [50 %-80 %] 50 % Set Rate:  [20 bmp-24 bmp] 20 bmp Vt Set:  [932 mL] 620 mL PEEP:  [8 cmH20] 8 cmH20 Plateau Pressure:  [20 cmH20-22 cmH20] 21 cmH20  INTAKE / OUTPUT: I/O last 3 completed shifts: In: 73  [I.V.:2959; Other:100; NG/GT:262; IV Piggyback:750] Out: 5610 [Urine:5610]  PHYSICAL EXAMINATION:  General:  Ill-appearing obese man, in no distress Neuro:  Sedated but will interact with voice and stim, purposeful, not following commands HEENT:  ETT in place, no oral lesion Cardiovascular:  Regular, distant, no murmur Lungs: No wheeze, mild basilar crackles Abdomen:  Soft, nondistended, hypoactive bowel sounds Musculoskeletal: No deformities Skin: No rash  LABS:  BMET  Recent Labs Lab 03/25/16 1525 03/26/16 0350 03/27/16 0315  NA 142 142 147*  K 3.6 3.5 3.4*  CL 98* 96* 101  CO2 34* 34* 35*  BUN 65* 61* 49*  CREATININE 2.67* 2.57* 2.00*  GLUCOSE 183* 173* 156*    Electrolytes  Recent Labs Lab 03/24/16 0047 03/24/16 0435  03/25/16 0740 03/25/16 1525 03/26/16 0350 03/27/16 0315  CALCIUM 7.2* 7.0*  < > 7.3* 7.5* 7.8* 8.0*  MG 2.3  --   --  1.9  --  1.9  --   PHOS 5.7* 3.6  --   --   --  4.1  --   < > = values in this interval not displayed.  CBC  Recent Labs Lab 03/25/16 1525 03/26/16 0350 03/27/16 0315 03/27/16 0600  WBC 15.3* 20.2* 15.1*  --   HGB 7.9* 7.9* 6.7* 6.7*  HCT 23.6* 24.6* 20.8* 20.5*  PLT 156 171 143*  --     Coag's  Recent Labs Lab 03/12/2016 1318 03/24/16 0047  APTT  --  28  INR 1.35  --     Sepsis Markers  Recent Labs Lab 04/03/2016 1806 04/01/2016 1823 03/08/2016 2124 03/06/2016 2153 03/24/16 0825 03/25/16 0740  LATICACIDVEN 8.8*  --  2.2* 1.87  --   --   PROCALCITON  --  4.16  --   --  11.49 6.16    ABG  Recent Labs Lab 03/25/16 2103 03/26/16 0334 03/27/16 0321  PHART 7.324* 7.342* 7.476*  PCO2ART 70.6* 68.4* 53.3*  PO2ART 118* 233* 62.4*    Liver Enzymes  Recent Labs Lab 03/21/2016 1318 03/24/16 0435 03/27/16 0315  AST 93*  --  183*  ALT 51  --  287*  ALKPHOS 48  --  57  BILITOT 0.9  --  1.3*  ALBUMIN 2.4* 1.9* 1.5*    Cardiac Enzymes  Recent Labs Lab 03/24/16 0435 03/24/16 0825 03/24/16 1445   TROPONINI 0.06* 0.07* 0.07*    Glucose  Recent Labs Lab 03/26/16 0746 03/26/16 1153 03/26/16 1545 03/26/16 1922 03/26/16 2345 03/27/16 0318  GLUCAP 158* 148* 153* 146* 169* 150*    Imaging Dg Chest Port 1 View  Result Date: 03/27/2016 CLINICAL DATA:  Hypoxia EXAM: PORTABLE CHEST 1 VIEW COMPARISON:  March 26, 2016 FINDINGS: Endotracheal tube tip is 5.5 cm above the carina. Feeding tube tip is below the diaphragm. Central catheter tip is in the left innominate vein. No pneumothorax. There is airspace consolidation in the right base with small right pleural effusion. There is atelectatic change in the left base. Heart is enlarged but stable. The pulmonary vascularity is normal. No adenopathy. No bone lesions. IMPRESSION: Tube and catheter positions as described without pneumothorax. Airspace consolidation consistent with pneumonia right base. Small right pleural effusion. Mild atelectasis left base. Left lung otherwise clear. Stable cardiac enlargement. Electronically Signed   By: Lowella Grip III M.D.   On: 03/27/2016 07:08   Dg Abd Portable 1v  Result Date: 03/26/2016 CLINICAL DATA:  Nasogastric tube placement EXAM: PORTABLE ABDOMEN - 1 VIEW COMPARISON:  CT abdomen and pelvis March 24, 2014 FINDINGS: Nasogastric tube tip is at the level of the fourth portion of the duodenum. There is a single loop of mildly dilated small bowel in the left abdomen. No air-fluid level. No free air. There is postoperative change in the lower lumbar spine. Visualized lung bases are clear. IMPRESSION: Enteric tube tip at fourth portion of duodenum. Mildly dilated loop of small bowel in the mid left abdomen. No free air. Postoperative change lower lumbar spine. Electronically Signed   By: Lowella Grip III M.D.   On: 03/26/2016 16:02     STUDIES:  CT head 3/19 > No evidence of acute intracranial abnormality. Generalized cerebral volume loss. Mild paranasal sinusitis of uncertain acuity . CT abd/pelv  3/19 > Stranding around the pancreas compatible with acute pancreatitis. Fatty infiltration of the liver. Distended, fluid-filled stomach. EGD 3/19 > Severe esophagitis with no acute hemorrhage  CULTURES: Blood 3/19 >> 1 of 2 GPC >>  S viridans Urine 3/19 > insignificant growth Resp 3/21 >> abundant yeast, PMNs >> reincubated >>  Blood 3/21 >>  Urine 3/21 >> negative  ANTIBIOTICS: vanco 3/19 >> 3/20 vanco 3/22 >>  Zosyn 3/19 >  SIGNIFICANT EVENTS: 3/19 admit, ARF 3/19 PM cardiac arrest 6 mins, intubated. On pressors.   LINES/TUBES: CVL 3/19 > ETT 3/19 > L fem art line 3/21 >  DISCUSSION: 56 year old male admitted with AMS and  AKI. Was improving but suffered massive coffee ground emesis and subsequent brief cardiac arrest. Intubated and on pressors. Slow improvement MS, vent needs, renal fxn  ASSESSMENT / PLAN:  PULMONARY A: Acute hypoxemic respiratory failure: suspect he aspirated on emesis, CXR and exam also concerning ALI vs edema. COPD without acute exacerbation. OSA. Probable LLL PNA (? Aspiration, CAP) P:   Bilateral alveolar infiltrates with some small pleural effusions. Continue current strategy, 8 mL/kg, wean PEEP and FiO2. Exhibits a prolonged exp phase that may make decreasing Vt challenging, may actually benefit from decreasing RR Continue to follow chest x-ray He is auto diuresing. Hold off on Lasix for 1 more day, consider starting on 3/24 Follow ABG, chest x-ray intermittently Scheduled bronchodilators  CARDIOVASCULAR A:  Shock: hemorrhagic, +/- septic  H/o HTN, CHF. P:  Echocardiogram today given strep viridans noted on one blood culture, continued hypotension Stop vasopressin. We norepinephrine as able Goal CVP 10-12  RENAL A:   Acute renal failure - presumed ATN in setting dehydration/ARB/NSAIDs/shock. Improved Hyperkalemia - resolved. AG acidosis secondary to lactic/renal failure - resolved. New met alkalosis 3/21. Hyponatremia dehydration /  beer potomania - resolved. Hypernatremia P:   Follow BMP, UOP As above, hold Lasix 3/23. Consider on 3/24 Start free water 3/23 Appreciate Nephrology assistance, now signed off.   GASTROINTESTINAL A:   GIB - severe esophagitis on EGD. No acute bleed so no interventions performed. Acute pancreatitis - per CT but lipase / amylase normal, suspect more chronic . H/o Barrett's esophagus. P:   Tolerated trickle tube feeds, will advance 3/23 1 unit PRBC on 3/23 May need to reconsult gastroenterology depending on hemoglobin trend Protonix twice a day  HEMATOLOGIC A:   ABLA secondary to GIB, some continued blood loss, no hematemesis, no melena yet P:  Follow serial CBC Transfusion goal Hgb > 7, note 1 unit PRBC today  INFECTIOUS A:   Aspiration PNA, LLL Yeast resp cx, unclear significance  S. Viridans 1 of 2 blood cx, repeat negative so far P:   Continue Zosyn as ordered Likely DC vancomycin neck 24 hours as repeat blood cultures are negative so far. Zosyn should cover strep viridans Echocardiogram to evaluate for possible endocarditis  ENDOCRINE A:   Hyperglycemia. P:   Continue sliding scale insulin per protocol  NEUROLOGIC A:   Acute metabolic encephalopathy: multifactorial - withdrawal, uremia, hepatic, sedation. At risk ETOH/benzo withdrawal. P:   Sedation: fentanyl/versed infusions. RASS goal -1 Actively wean versed and fentanyl 3/23, assess MS  FAMILY  - Updates: Dr Lamonte Sakai updated daughter at bedside 3/22  - Inter-disciplinary family meet or Palliative Care meeting due by: 3/25  Independent CC time 55 minutes  Baltazar Apo, MD, PhD 03/27/2016, 9:23 AM [facility] [phone] or if no answer [phone]

## 2016-03-27 NOTE — Progress Notes (Signed)
CRITICAL VALUE ALERT  Critical value received:  hgb 6.7  Date of notification:  03/27/16  Time of notification:  0320  Critical value read back:Yes.    Nurse who received alert:  Donald Siva RN  MD notified (1st page):  Dr. Ashok Cordia  Time of first page:  0340  MD notified (2nd page):  Time of second page:  Responding MD:  Dr. Ashok Cordia  Time MD responded:  [phone]

## 2016-03-27 NOTE — Progress Notes (Signed)
eLink Physician-Brief Progress Note Patient Name: Jesse Hartman DOB: 1960-08-25 MRN: [id number]   Date of Service  03/27/2016  HPI/Events of Note  Hemoglobin still 6.7.   eICU Interventions  Transfuse 1 unit packed red blood cells. Defer further workup to rounded physician.       Intervention Category Intermediate Interventions: Other:  Tera Partridge 03/27/2016, 6:53 AM

## 2016-03-27 NOTE — Progress Notes (Signed)
eLink Physician-Brief Progress Note Patient Name: Jesse Hartman DOB: [birth year] MRN: [id number]   Date of Service  03/27/2016  HPI/Events of Note  Hemoglobin 6.7 this morning. Nurse reports steadily weaning vasopressor support. No signs of active bleeding. Patient currently not tachycardic.   eICU Interventions  1. Checking stat H&H 2. Stat type and screen      Intervention Category Major Interventions: Other:  Tera Partridge 03/27/2016, 5:04 AM

## 2016-03-28 ENCOUNTER — Inpatient Hospital Stay (HOSPITAL_COMMUNITY): Payer: Medicare Other

## 2016-03-28 LAB — GLUCOSE, CAPILLARY
GLUCOSE-CAPILLARY: 149 mg/dL — AB (ref 65–99)
Glucose-Capillary: 140 mg/dL — ABNORMAL HIGH (ref 65–99)
Glucose-Capillary: 163 mg/dL — ABNORMAL HIGH (ref 65–99)
Glucose-Capillary: 168 mg/dL — ABNORMAL HIGH (ref 65–99)
Glucose-Capillary: 170 mg/dL — ABNORMAL HIGH (ref 65–99)

## 2016-03-28 LAB — PROTIME-INR
INR: 1.19
PROTHROMBIN TIME: 15.1 s (ref 11.4–15.2)

## 2016-03-28 LAB — CBC
HCT: 25.7 % — ABNORMAL LOW (ref 39.0–52.0)
Hemoglobin: 8 g/dL — ABNORMAL LOW (ref 13.0–17.0)
MCH: 30.8 pg (ref 26.0–34.0)
MCHC: 31.1 g/dL (ref 30.0–36.0)
MCV: 98.8 fL (ref 78.0–100.0)
PLATELETS: 168 10*3/uL (ref 150–400)
RBC: 2.6 MIL/uL — AB (ref 4.22–5.81)
RDW: 17.5 % — ABNORMAL HIGH (ref 11.5–15.5)
WBC: 15.5 10*3/uL — AB (ref 4.0–10.5)

## 2016-03-28 LAB — TYPE AND SCREEN
ABO/RH(D): A POS
Antibody Screen: NEGATIVE
UNIT DIVISION: 0

## 2016-03-28 LAB — BASIC METABOLIC PANEL
ANION GAP: 10 (ref 5–15)
BUN: 45 mg/dL — ABNORMAL HIGH (ref 6–20)
CHLORIDE: 107 mmol/L (ref 101–111)
CO2: 36 mmol/L — ABNORMAL HIGH (ref 22–32)
Calcium: 8.2 mg/dL — ABNORMAL LOW (ref 8.9–10.3)
Creatinine, Ser: 1.8 mg/dL — ABNORMAL HIGH (ref 0.61–1.24)
GFR calc Af Amer: 48 mL/min — ABNORMAL LOW (ref >60)
GFR calc non Af Amer: 41 mL/min — ABNORMAL LOW (ref >60)
GLUCOSE: 160 mg/dL — AB (ref 65–99)
POTASSIUM: 3.7 mmol/L (ref 3.5–5.1)
Sodium: 153 mmol/L — ABNORMAL HIGH (ref 135–145)

## 2016-03-28 LAB — TRIGLYCERIDES: TRIGLYCERIDES: 144 mg/dL (ref <150)

## 2016-03-28 LAB — BPAM RBC
BLOOD PRODUCT EXPIRATION DATE: 201804122359
ISSUE DATE / TIME: 201803230852
Unit Type and Rh: 6200

## 2016-03-28 LAB — PHOSPHORUS: Phosphorus: 3.5 mg/dL (ref 2.5–4.6)

## 2016-03-28 LAB — CULTURE, BLOOD (ROUTINE X 2): Culture: NO GROWTH

## 2016-03-28 LAB — MAGNESIUM: MAGNESIUM: 2.1 mg/dL (ref 1.7–2.4)

## 2016-03-28 MED ORDER — NOREPINEPHRINE BITARTRATE 1 MG/ML IV SOLN
0.0000 ug/min | INTRAVENOUS | Status: DC
  Administered 2016-03-28 – 2016-03-30 (×2): 4 ug/min via INTRAVENOUS
  Administered 2016-04-01: 20 ug/min via INTRAVENOUS
  Administered 2016-04-02: 30 ug/min via INTRAVENOUS
  Administered 2016-04-02: 26 ug/min via INTRAVENOUS
  Filled 2016-03-28 (×5): qty 16

## 2016-03-28 MED ORDER — NOREPINEPHRINE BITARTRATE 1 MG/ML IV SOLN: 0.0000 ug/min | INTRAVENOUS | Status: DC

## 2016-03-28 MED ORDER — FREE WATER
400.0000 mL | Freq: Four times a day (QID) | Status: DC
  Administered 2016-03-28 – 2016-04-03 (×23): 400 mL

## 2016-03-28 MED ORDER — PROPOFOL 1000 MG/100ML IV EMUL
0.0000 ug/kg/min | INTRAVENOUS | Status: DC
  Administered 2016-03-28: 45 ug/kg/min via INTRAVENOUS
  Administered 2016-03-28: 20 ug/kg/min via INTRAVENOUS
  Administered 2016-03-29: 50 ug/kg/min via INTRAVENOUS
  Administered 2016-03-29: 45 ug/kg/min via INTRAVENOUS
  Administered 2016-03-29 (×3): 50 ug/kg/min via INTRAVENOUS
  Administered 2016-03-29: 20 ug/kg/min via INTRAVENOUS
  Administered 2016-03-29 – 2016-03-30 (×5): 50 ug/kg/min via INTRAVENOUS
  Administered 2016-03-30: 25 ug/kg/min via INTRAVENOUS
  Administered 2016-03-30: 50 ug/kg/min via INTRAVENOUS
  Administered 2016-03-30: 25 ug/kg/min via INTRAVENOUS
  Administered 2016-03-30: 50 ug/kg/min via INTRAVENOUS
  Administered 2016-03-31 (×4): 40 ug/kg/min via INTRAVENOUS
  Filled 2016-03-28 (×23): qty 100

## 2016-03-28 NOTE — Progress Notes (Signed)
PULMONARY / CRITICAL CARE MEDICINE   Name: Jesse Hartman MRN: [id number] DOB: Feb 17, 1960    ADMISSION DATE:  03/29/2016 CONSULTATION DATE:  03/08/2016  REFERRING MD:  Dr. Vanita Panda  CHIEF COMPLAINT:  AMS  HISTORY OF PRESENT ILLNESS:   56 year old male with PMH as below, which includes ETOH abuse (24 pack per day), COPD, Barrett's esophagus, HTN, Seizures, and OSA. He was started on lasix and potassium supplementation approximately 3/12. He was found down by his daughter 3/19 and brought to ED via EMS. She first noticed him to be acting strange 3/17 when he was slurring his words and speaking gibberish on the telephone. Then 3/19 she tried to call him on the phone again and he did not answer so she went to his house and found him lying in his own stool/vomit, which she described as bloody. Upon arrival to ED he was hypotensive, which was slow to respond to IVF bolus. He was able to follow commands, but was confused. Laboratory evaluation significant for serum creatinine 4.2, K 6.2, Sodium 121, BUN 103, Co2 12.4,  and Lactic acid 11. Imaging of head and abd/pelvis unrevealing. While in CT he had episode of projectile vomiting of coffee ground emesis. Despite IVF resuscitation he remained hypotensive and PCCM called to admit.   SUBJECTIVE/Interval:  Patient is continued to have significant coughing, causing agitation and vent alarming Fentanyl and Versed increased over last 24 hours  VITAL SIGNS: BP 114/66   Pulse (!) 106   Temp 98.7 F (37.1 C) (Rectal)   Resp 18   Ht 6' (1.829 m)   Wt 128.6 kg (283 lb 8.2 oz)   SpO2 99%   BMI 38.45 kg/m   HEMODYNAMICS:    VENTILATOR SETTINGS: Vent Mode: PRVC FiO2 (%):  [50 %-60 %] 50 % Set Rate:  [14 bmp] 14 bmp Vt Set:  [585 mL] 620 mL PEEP:  [8 cmH20] 8 cmH20 Plateau Pressure:  [20 cmH20-30 cmH20] 22 cmH20  INTAKE / OUTPUT: I/O last 3 completed shifts: In: 4248.7 [I.V.:1514.7; Blood:335; Other:10; NG/GT:2139; IV Piggyback:250] Out: 3400  [Urine:3400]  PHYSICAL EXAMINATION:  General:  Ill-appearing man in no distress on mechanical ventilation Neuro:  Sedated, he coughs and moves non-purposefully with stimulation HEENT:  ETT in place, no oral lesions Cardiovascular:  Distant, regular, no murmurs heard Lungs: Diffuse bilateral rhonchi, no wheezing Abdomen:  Soft, benign, hypoactive bowel sounds Musculoskeletal: No deformity Skin: No rash  LABS:  BMET  Recent Labs Lab 03/26/16 0350 03/27/16 0315 03/28/16 0400  NA 142 147* 153*  K 3.5 3.4* 3.7  CL 96* 101 107  CO2 34* 35* 36*  BUN 61* 49* 45*  CREATININE 2.57* 2.00* 1.80*  GLUCOSE 173* 156* 160*    Electrolytes  Recent Labs Lab 03/24/16 0435  03/25/16 0740  03/26/16 0350 03/27/16 0315 03/28/16 0400  CALCIUM 7.0*  < > 7.3*  < > 7.8* 8.0* 8.2*  MG  --   --  1.9  --  1.9  --  2.1  PHOS 3.6  --   --   --  4.1  --  3.5  < > = values in this interval not displayed.  CBC  Recent Labs Lab 03/27/16 0315  03/27/16 1402 03/27/16 1800 03/28/16 0400  WBC 15.1*  --   --  15.4* 15.5*  HGB 6.7*  < > 7.6* 8.1* 8.0*  HCT 20.8*  < > 23.9* 24.9* 25.7*  PLT 143*  --   --  151 168  < > =  values in this interval not displayed.  Coag's  Recent Labs Lab 03/07/2016 1318 03/24/16 0047 03/28/16 0400  APTT  --  28  --   INR 1.35  --  1.19    Sepsis Markers  Recent Labs Lab 03/05/2016 1806 03/29/2016 1823 03/09/2016 2124 04/01/2016 2153 03/24/16 0825 03/25/16 0740  LATICACIDVEN 8.8*  --  2.2* 1.87  --   --   PROCALCITON  --  4.16  --   --  11.49 6.16    ABG  Recent Labs Lab 03/25/16 2103 03/26/16 0334 03/27/16 0321  PHART 7.324* 7.342* 7.476*  PCO2ART 70.6* 68.4* 53.3*  PO2ART 118* 233* 62.4*    Liver Enzymes  Recent Labs Lab 03/25/2016 1318 03/24/16 0435 03/27/16 0315  AST 93*  --  183*  ALT 51  --  287*  ALKPHOS 48  --  57  BILITOT 0.9  --  1.3*  ALBUMIN 2.4* 1.9* 1.5*    Cardiac Enzymes  Recent Labs Lab 03/24/16 0435 03/24/16 0825  03/24/16 1445  TROPONINI 0.06* 0.07* 0.07*    Glucose  Recent Labs Lab 03/27/16 1539 03/27/16 1916 03/27/16 2318 03/28/16 0346 03/28/16 0839 03/28/16 1155  GLUCAP 136* 160* 156* 149* 140* 163*    Imaging Dg Chest Port 1 View  Result Date: 03/28/2016 CLINICAL DATA:  Hypoxia EXAM: PORTABLE CHEST 1 VIEW COMPARISON:  March 27, 2016 FINDINGS: Endotracheal tube tip is 6.2 cm above the carina. Feeding tube extends below the diaphragm. Central catheter tip is in the left innominate vein. No pneumothorax. There is persistent airspace consolidation in both lung bases, more on the right than on the left. There is a small right pleural effusion. There is cardiomegaly with pulmonary vascular within normal limits. No adenopathy. IMPRESSION: Tube and catheter positions as described without pneumothorax. Persistent bibasilar consolidation, more on the right than on the left. Stable cardiomegaly with small right pleural effusion. There may be a degree of underlying congestive heart failure. Consolidation in the bases is suspicious for pneumonia, although alveolar edema may present in this manner. Both pneumonia and edema may present concurrently. Electronically Signed   By: Lowella Grip III M.D.   On: 03/28/2016 09:07     STUDIES:  CT head 3/19 > No evidence of acute intracranial abnormality. Generalized cerebral volume loss. Mild paranasal sinusitis of uncertain acuity . CT abd/pelv 3/19 > Stranding around the pancreas compatible with acute pancreatitis. Fatty infiltration of the liver. Distended, fluid-filled stomach. EGD 3/19 > Severe esophagitis with no acute hemorrhage  CULTURES: Blood 3/19 >> 1 of 2 GPC >>  S viridans Urine 3/19 > insignificant growth Resp 3/21 >> abundant yeast, PMNs >> reincubated >>  Blood 3/21 >>  Urine 3/21 >> negative  ANTIBIOTICS: vanco 3/19 >> 3/20 vanco 3/22 >>  Zosyn 3/19 >  SIGNIFICANT EVENTS: 3/19 admit, ARF 3/19 PM cardiac arrest 6 mins, intubated.  On pressors.   LINES/TUBES: CVL 3/19 > ETT 3/19 > L fem art line 3/21 >  DISCUSSION: 56 year old male admitted with AMS and AKI. Was improving but suffered massive coffee ground emesis and subsequent brief cardiac arrest. Intubated and on pressors. Slow improvement MS, vent needs, renal fxn  ASSESSMENT / PLAN:  PULMONARY A: Acute hypoxemic respiratory failure: suspect he aspirated on emesis, CXR and exam also concerning ALI vs edema. COPD without acute exacerbation. OSA. Probable LLL PNA (? Aspiration, CAP) P:   Continue current ventilator mode Follow chest x-ray He is auto diuresing, we will add Lasix on 3/25 Follow  chest x-ray Scheduled bronchodilators  CARDIOVASCULAR A:  Shock: hemorrhagic, +/- septic  H/o HTN, CHF. P:  Echocardiogram pending for strep viridans noted on one blood culture, continued hypotension We norepinephrine as able Goal CVP 10-12  RENAL A:   Acute renal failure - presumed ATN in setting dehydration/ARB/NSAIDs/shock. Improved Hyperkalemia - resolved. AG acidosis secondary to lactic/renal failure - resolved. New met alkalosis 3/21. Hyponatremia dehydration / beer potomania - resolved. Hypernatremia P:   Follow BMP, urine output Consider Lasix on 3/25 Increase free water on 3/24 Appreciate Nephrology assistance, now signed off.   GASTROINTESTINAL A:   GIB - severe esophagitis on EGD. No acute bleed so no interventions performed. Acute pancreatitis - per CT but lipase / amylase normal, suspect more chronic . H/o Barrett's esophagus. P:   Continue tube feeding 1 unit PRBC on 3/23 Hold off on GI consultation for now given stable hemoglobin Protonic twice a day  HEMATOLOGIC A:   ABLA secondary to GIB, some continued blood loss, no hematemesis, no melena yet P:  Follow CBC Transfusion goal Hgb > 7  INFECTIOUS A:   Aspiration PNA, LLL Yeast resp cx, unclear significance  S. Viridans 1 of 2 blood cx, repeat negative so far P:    Continue Zosyn as ordered Vancomycin stopped on 3/23 Echocardiogram pending  ENDOCRINE A:   Hyperglycemia. P:   Sinus scale insulin  NEUROLOGIC A:   Acute metabolic encephalopathy: multifactorial - withdrawal, uremia, hepatic, sedation. At risk ETOH/benzo withdrawal. P:   RASS goal -1 Still with some agitation on Versed and fentanyl We will change versed to propofol and follow  FAMILY  - Updates: Dr Lamonte Sakai updated daughter at bedside 3/22 and 3/24  - Inter-disciplinary family meet or Palliative Care meeting due by: 3/25  Independent CC time 25 minutes  Baltazar Apo, MD, PhD 03/28/2016, 3:13 PM [facility] [phone] or if no answer [phone]

## 2016-03-28 NOTE — Progress Notes (Signed)
Midnight CPT treatment held, patient did not tolerate and became agitated.

## 2016-03-29 ENCOUNTER — Inpatient Hospital Stay (HOSPITAL_COMMUNITY): Payer: Medicare Other

## 2016-03-29 DIAGNOSIS — J690 Pneumonitis due to inhalation of food and vomit: Secondary | ICD-10-CM | POA: Diagnosis present

## 2016-03-29 DIAGNOSIS — J449 Chronic obstructive pulmonary disease, unspecified: Secondary | ICD-10-CM

## 2016-03-29 DIAGNOSIS — R6521 Severe sepsis with septic shock: Secondary | ICD-10-CM

## 2016-03-29 DIAGNOSIS — A419 Sepsis, unspecified organism: Secondary | ICD-10-CM | POA: Diagnosis present

## 2016-03-29 LAB — BLOOD GAS, ARTERIAL
ACID-BASE EXCESS: 11 mmol/L — AB (ref 0.0–2.0)
BICARBONATE: 37 mmol/L — AB (ref 20.0–28.0)
FIO2: 50
LHR: 14 {breaths}/min
MECHVT: 620 mL
O2 SAT: 90.1 %
PATIENT TEMPERATURE: 98.6
PCO2 ART: 70.5 mmHg — AB (ref 32.0–48.0)
PEEP/CPAP: 8 cmH2O
PH ART: 7.34 — AB (ref 7.350–7.450)
PO2 ART: 62.7 mmHg — AB (ref 83.0–108.0)

## 2016-03-29 LAB — CBC
HCT: 25.4 % — ABNORMAL LOW (ref 39.0–52.0)
HEMOGLOBIN: 7.8 g/dL — AB (ref 13.0–17.0)
MCH: 31.1 pg (ref 26.0–34.0)
MCHC: 30.7 g/dL (ref 30.0–36.0)
MCV: 101.2 fL — ABNORMAL HIGH (ref 78.0–100.0)
Platelets: 172 10*3/uL (ref 150–400)
RBC: 2.51 MIL/uL — AB (ref 4.22–5.81)
RDW: 17.2 % — ABNORMAL HIGH (ref 11.5–15.5)
WBC: 14.1 10*3/uL — AB (ref 4.0–10.5)

## 2016-03-29 LAB — BASIC METABOLIC PANEL
ANION GAP: 8 (ref 5–15)
BUN: 45 mg/dL — ABNORMAL HIGH (ref 6–20)
CO2: 35 mmol/L — ABNORMAL HIGH (ref 22–32)
Calcium: 8.1 mg/dL — ABNORMAL LOW (ref 8.9–10.3)
Chloride: 108 mmol/L (ref 101–111)
Creatinine, Ser: 1.81 mg/dL — ABNORMAL HIGH (ref 0.61–1.24)
GFR, EST AFRICAN AMERICAN: 47 mL/min — AB (ref >60)
GFR, EST NON AFRICAN AMERICAN: 41 mL/min — AB (ref >60)
Glucose, Bld: 174 mg/dL — ABNORMAL HIGH (ref 65–99)
Potassium: 3.4 mmol/L — ABNORMAL LOW (ref 3.5–5.1)
SODIUM: 151 mmol/L — AB (ref 135–145)

## 2016-03-29 LAB — GLUCOSE, CAPILLARY
GLUCOSE-CAPILLARY: 160 mg/dL — AB (ref 65–99)
GLUCOSE-CAPILLARY: 160 mg/dL — AB (ref 65–99)
GLUCOSE-CAPILLARY: 167 mg/dL — AB (ref 65–99)
GLUCOSE-CAPILLARY: 174 mg/dL — AB (ref 65–99)
Glucose-Capillary: 181 mg/dL — ABNORMAL HIGH (ref 65–99)
Glucose-Capillary: 159 mg/dL — ABNORMAL HIGH (ref 65–99)

## 2016-03-29 MED ORDER — BISACODYL 10 MG RE SUPP: 10.0000 mg | Freq: Every day | RECTAL | Status: DC | PRN

## 2016-03-29 MED ORDER — POTASSIUM CHLORIDE 20 MEQ/15ML (10%) PO SOLN
40.0000 meq | Freq: Once | ORAL | Status: AC
  Administered 2016-03-29: 40 meq
  Filled 2016-03-29: qty 30

## 2016-03-29 MED ORDER — FUROSEMIDE 10 MG/ML IJ SOLN
40.0000 mg | Freq: Once | INTRAMUSCULAR | Status: AC
  Administered 2016-03-29: 40 mg via INTRAVENOUS
  Filled 2016-03-29: qty 4

## 2016-03-29 MED ORDER — BISACODYL 10 MG RE SUPP
10.0000 mg | Freq: Every day | RECTAL | Status: DC | PRN
  Administered 2016-03-29 – 2016-04-06 (×2): 10 mg via RECTAL
  Filled 2016-03-29 (×2): qty 1

## 2016-03-29 NOTE — Progress Notes (Signed)
MAde CCM MD aware of flip back to full support on ventilator as patient could not tolerate pressure support any longer. When respiratory flipped the patient back to full support the patient could not sustain an O2 saturation above 86%. Respiratory is recruiting with the ventilator and  I will continue to monitor. Bartholomew Crews, RN 03/29/2016 5:49 PM

## 2016-03-29 NOTE — Progress Notes (Signed)
CRITICAL VALUE ALERT  Critical value received:  PCO2 70.5  Date of notification:  03/29/16  Time of notification:  9629  Critical value read back:Yes.    Nurse who received alert:  Vic Blackbird RN  MD notified (1st page):  eLink MD  Time of first page:  [phone]  Responding MD:  Joya Gaskins, MD  Time MD responded:  [phone]   No new orders at this time.

## 2016-03-29 NOTE — Progress Notes (Signed)
PULMONARY / CRITICAL CARE MEDICINE   Name: Jesse Hartman MRN: [id number] DOB: 1960/01/17    ADMISSION DATE:  03/30/2016 CONSULTATION DATE:  03/10/2016  REFERRING MD:  Dr. Vanita Panda  CHIEF COMPLAINT:  AMS  HISTORY OF PRESENT ILLNESS:   56 year old male with PMH as below, which includes ETOH abuse (24 pack per day), COPD, Barrett's esophagus, HTN, Seizures, and OSA. He was started on lasix and potassium supplementation approximately 3/12. He was found down by his daughter 3/19 and brought to ED via EMS. She first noticed him to be acting strange 3/17 when he was slurring his words and speaking gibberish on the telephone. Then 3/19 she tried to call him on the phone again and he did not answer so she went to his house and found him lying in his own stool/vomit, which she described as bloody. Upon arrival to ED he was hypotensive, which was slow to respond to IVF bolus. He was able to follow commands, but was confused. Laboratory evaluation significant for serum creatinine 4.2, K 6.2, Sodium 121, BUN 103, Co2 12.4,  and Lactic acid 11. Imaging of head and abd/pelvis unrevealing. While in CT he had episode of projectile vomiting of coffee ground emesis. Despite IVF resuscitation he remained hypotensive and PCCM called to admit.   SUBJECTIVE/Interval:  Increased infiltrates over the last 24 hours on chest x-ray Patient has significant cough that causes auto PEEP and subsequently hypotension Changed to propofol on 3/24  VITAL SIGNS: BP (!) 160/75   Pulse (!) 107   Temp 98.6 F (37 C) (Core (Comment))   Resp 18   Ht 6' (1.829 m)   Wt 128.8 kg (283 lb 15.2 oz)   SpO2 96%   BMI 38.51 kg/m   HEMODYNAMICS: CVP:  [12 mmHg-14 mmHg] 12 mmHg  VENTILATOR SETTINGS: Vent Mode: PRVC FiO2 (%):  [40 %-50 %] 50 % Set Rate:  [14 bmp] 14 bmp Vt Set:  [620 mL] 620 mL PEEP:  [8 cmH20] 8 cmH20 Plateau Pressure:  [15 cmH20-25 cmH20] 20 cmH20  INTAKE / OUTPUT: I/O last 3 completed shifts: In: 5874.8  [I.V.:1959.8; NG/GT:3715; IV Piggyback:200] Out: 8921 [Urine:3045]  PHYSICAL EXAMINATION:  General:  Ill-appearing man on mechanical ventilation, no distress currently Neuro:  Sedated, he will wake to voice, he often coughs when stimulated, very strong cough HEENT:  ETT in place, no oral lesions Cardiovascular:  Distant, regular, no murmurs Lungs: Diffuse bilateral crackles and rhonchi, more on the right than on the left, no wheeze Abdomen:  Soft, benign, hypoactive bowel sounds Musculoskeletal: no deformity Skin: no rash  LABS:  BMET  Recent Labs Lab 03/27/16 0315 03/28/16 0400 03/29/16 0330  NA 147* 153* 151*  K 3.4* 3.7 3.4*  CL 101 107 108  CO2 35* 36* 35*  BUN 49* 45* 45*  CREATININE 2.00* 1.80* 1.81*  GLUCOSE 156* 160* 174*    Electrolytes  Recent Labs Lab 03/24/16 0435  03/25/16 0740  03/26/16 0350 03/27/16 0315 03/28/16 0400 03/29/16 0330  CALCIUM 7.0*  < > 7.3*  < > 7.8* 8.0* 8.2* 8.1*  MG  --   --  1.9  --  1.9  --  2.1  --   PHOS 3.6  --   --   --  4.1  --  3.5  --   < > = values in this interval not displayed.  CBC  Recent Labs Lab 03/27/16 1800 03/28/16 0400 03/29/16 0330  WBC 15.4* 15.5* 14.1*  HGB 8.1* 8.0* 7.8*  HCT 24.9* 25.7* 25.4*  PLT 151 168 172    Coag's  Recent Labs Lab 03/26/2016 1318 03/24/16 0047 03/28/16 0400  APTT  --  28  --   INR 1.35  --  1.19    Sepsis Markers  Recent Labs Lab 03/21/2016 1806 03/30/2016 1823 04/03/2016 2124 03/25/2016 2153 03/24/16 0825 03/25/16 0740  LATICACIDVEN 8.8*  --  2.2* 1.87  --   --   PROCALCITON  --  4.16  --   --  11.49 6.16    ABG  Recent Labs Lab 03/26/16 0334 03/27/16 0321 03/29/16 0330  PHART 7.342* 7.476* 7.340*  PCO2ART 68.4* 53.3* 70.5*  PO2ART 233* 62.4* 62.7*    Liver Enzymes  Recent Labs Lab 03/24/2016 1318 03/24/16 0435 03/27/16 0315  AST 93*  --  183*  ALT 51  --  287*  ALKPHOS 48  --  57  BILITOT 0.9  --  1.3*  ALBUMIN 2.4* 1.9* 1.5*    Cardiac  Enzymes  Recent Labs Lab 03/24/16 0435 03/24/16 0825 03/24/16 1445  TROPONINI 0.06* 0.07* 0.07*    Glucose  Recent Labs Lab 03/28/16 1633 03/28/16 1934 03/29/16 0016 03/29/16 0337 03/29/16 0842 03/29/16 1255  GLUCAP 168* 170* 160* 174* 181* 159*    Imaging Dg Chest Port 1 View  Result Date: 03/29/2016 CLINICAL DATA:  Acute respiratory failure. EXAM: PORTABLE CHEST 1 VIEW COMPARISON:  03/28/2016. FINDINGS: Enlarged cardiomediastinal silhouette. Worsening aeration with BILATERAL pulmonary opacities representing either pneumonia or edema. BILATERAL effusions. No pneumothorax. Support tubes and lines appear stable. IMPRESSION: Worsening aeration; the findings could represent congestive heart failure and/or BILATERAL pneumonia. Electronically Signed   By: Staci Righter M.D.   On: 03/29/2016 09:11     STUDIES:  CT head 3/19 > No evidence of acute intracranial abnormality. Generalized cerebral volume loss. Mild paranasal sinusitis of uncertain acuity . CT abd/pelv 3/19 > Stranding around the pancreas compatible with acute pancreatitis. Fatty infiltration of the liver. Distended, fluid-filled stomach. EGD 3/19 > Severe esophagitis with no acute hemorrhage  CULTURES: Blood 3/19 >> 1 of 2 GPC >>  S viridans Urine 3/19 > insignificant growth Resp 3/21 >> abundant yeast, PMNs >> reincubated >>  Few yeast Blood 3/21 >>  Urine 3/21 >> negative  ANTIBIOTICS: vanco 3/19 >> 3/20 vanco 3/22 >>  Zosyn 3/19 >  SIGNIFICANT EVENTS: 3/19 admit, ARF 3/19 PM cardiac arrest 6 mins, intubated. On pressors.   LINES/TUBES: CVL 3/19 > ETT 3/19 > L fem art line 3/21 >  DISCUSSION: 56 year old male admitted with AMS and AKI. Was improving but suffered massive coffee ground emesis and subsequent brief cardiac arrest. Intubated and on pressors. Slow improvement MS, vent needs, renal fxn  ASSESSMENT / PLAN:  PULMONARY A: Acute hypoxemic respiratory failure: suspect he aspirated on emesis,  CXR and exam also concerning ALI vs edema. COPD without acute exacerbation. OSA. Probable LLL PNA (? Aspiration, CAP) P:   I will push to get him on pressure support soon as I believe this will be more comfortable vent mode for him, may decrease coughing and also auto PEEP Follow chest x-ray Would like to start some gentle diuresis, note that he has a hypernatremia Scheduled bronchodilators Consider bronchoscopy for respiratory culture data  CARDIOVASCULAR A:  Shock: hemorrhagic, +/- septic  H/o HTN, CHF. P:  Echo pending for strep viridans noted on one blood culture Wean norepinephrine as able Goal CVP 10-12  RENAL A:   Acute renal failure - presumed ATN in  setting dehydration/ARB/NSAIDs/shock. Improving Hyperkalemia - resolved. AG acidosis secondary to lactic/renal failure - resolved. New met alkalosis 3/21. Hyponatremia dehydration / beer potomania - resolved. Hypernatremia P:   Follow BMP, urine output Add low-dose Lasix on 3/25 Continue current free water Appreciate Nephrology assistance, now signed off.   GASTROINTESTINAL A:   GIB - severe esophagitis on EGD. No acute bleed so no interventions performed. Acute pancreatitis - per CT but lipase / amylase normal, suspect more chronic . H/o Barrett's esophagus. Constipation P:   Continue tube feeding Hold off on GI consultation as his hemoglobin has stabilized Protonix twice a day. Add Dulcolax suppository  HEMATOLOGIC A:   ABLA secondary to GIB, some continued blood loss, no hematemesis, no melena yet P:  Follow CBC Transfusion goal Hgb > 7  INFECTIOUS A:   Aspiration PNA, LLL Yeast resp cx, unclear significance  S. Viridans 1 of 2 blood cx, repeat negative so far P:   Continue Zosyn as ordered Vancomycin stopped on 3/23 Echocardiogram pending Consider bronchoscopy for respiratory culture data  ENDOCRINE A:   Hyperglycemia. P:   SSI  NEUROLOGIC A:   Acute metabolic encephalopathy:  multifactorial - withdrawal, uremia, hepatic, sedation. At risk ETOH/benzo withdrawal. P:   RASS goal -1 Continue propofol and fentanyl drips  FAMILY  - Updates: Dr Lamonte Sakai updated daughter at bedside 3/22 and 3/24  - Inter-disciplinary family meet or Palliative Care meeting due by: 3/25  Independent CC time 34 minutes  Baltazar Apo, MD, PhD 03/29/2016, 1:14 PM [facility] [phone] or if no answer [phone]

## 2016-03-29 NOTE — Progress Notes (Signed)
[facility] Progress Note Patient Name: Jesse Hartman DOB: 05-18-1960 MRN: [id number]   Date of Service  03/29/2016  HPI/Events of Note  K low  eICU Interventions  K supp per tube     Intervention Category Major Interventions: Electrolyte abnormality - evaluation and management  Asencion Noble 03/29/2016, 5:14 AM

## 2016-03-30 ENCOUNTER — Inpatient Hospital Stay (HOSPITAL_COMMUNITY): Payer: Medicare Other

## 2016-03-30 DIAGNOSIS — E870 Hyperosmolality and hypernatremia: Secondary | ICD-10-CM

## 2016-03-30 LAB — BASIC METABOLIC PANEL
Anion gap: 10 (ref 5–15)
BUN: 47 mg/dL — AB (ref 6–20)
CALCIUM: 8.5 mg/dL — AB (ref 8.9–10.3)
CO2: 34 mmol/L — ABNORMAL HIGH (ref 22–32)
Chloride: 108 mmol/L (ref 101–111)
Creatinine, Ser: 1.76 mg/dL — ABNORMAL HIGH (ref 0.61–1.24)
GFR calc Af Amer: 49 mL/min — ABNORMAL LOW (ref >60)
GFR, EST NON AFRICAN AMERICAN: 42 mL/min — AB (ref >60)
Glucose, Bld: 198 mg/dL — ABNORMAL HIGH (ref 65–99)
Potassium: 3.4 mmol/L — ABNORMAL LOW (ref 3.5–5.1)
SODIUM: 152 mmol/L — AB (ref 135–145)

## 2016-03-30 LAB — CULTURE, BLOOD (ROUTINE X 2)
CULTURE: NO GROWTH
Culture: NO GROWTH

## 2016-03-30 LAB — POCT I-STAT 3, ART BLOOD GAS (G3+)
Acid-Base Excess: 14 mmol/L — ABNORMAL HIGH (ref 0.0–2.0)
BICARBONATE: 39.4 mmol/L — AB (ref 20.0–28.0)
O2 Saturation: 88 %
PCO2 ART: 62 mmHg — AB (ref 32.0–48.0)
PO2 ART: 56 mmHg — AB (ref 83.0–108.0)
TCO2: 41 mmol/L (ref 0–100)
pH, Arterial: 7.411 (ref 7.350–7.450)

## 2016-03-30 LAB — CBC
HCT: 26 % — ABNORMAL LOW (ref 39.0–52.0)
Hemoglobin: 7.9 g/dL — ABNORMAL LOW (ref 13.0–17.0)
MCH: 30.9 pg (ref 26.0–34.0)
MCHC: 30.4 g/dL (ref 30.0–36.0)
MCV: 101.6 fL — ABNORMAL HIGH (ref 78.0–100.0)
Platelets: 219 10*3/uL (ref 150–400)
RBC: 2.56 MIL/uL — ABNORMAL LOW (ref 4.22–5.81)
RDW: 17.2 % — AB (ref 11.5–15.5)
WBC: 12 10*3/uL — ABNORMAL HIGH (ref 4.0–10.5)

## 2016-03-30 LAB — GLUCOSE, CAPILLARY
GLUCOSE-CAPILLARY: 114 mg/dL — AB (ref 65–99)
GLUCOSE-CAPILLARY: 165 mg/dL — AB (ref 65–99)
GLUCOSE-CAPILLARY: 179 mg/dL — AB (ref 65–99)
GLUCOSE-CAPILLARY: 196 mg/dL — AB (ref 65–99)
Glucose-Capillary: 116 mg/dL — ABNORMAL HIGH (ref 65–99)
Glucose-Capillary: 152 mg/dL — ABNORMAL HIGH (ref 65–99)
Glucose-Capillary: 160 mg/dL — ABNORMAL HIGH (ref 65–99)

## 2016-03-30 LAB — MAGNESIUM: MAGNESIUM: 2 mg/dL (ref 1.7–2.4)

## 2016-03-30 MED ORDER — POTASSIUM CHLORIDE 20 MEQ/15ML (10%) PO SOLN
20.0000 meq | ORAL | Status: AC
  Administered 2016-03-30 (×2): 20 meq
  Filled 2016-03-30 (×2): qty 15

## 2016-03-30 MED ORDER — FENTANYL CITRATE (PF) 100 MCG/2ML IJ SOLN
100.0000 ug | Freq: Once | INTRAMUSCULAR | Status: AC
  Administered 2016-03-30: 100 ug via INTRAVENOUS

## 2016-03-30 MED ORDER — FOLIC ACID 1 MG PO TABS
1.0000 mg | ORAL_TABLET | Freq: Every day | ORAL | Status: DC
  Administered 2016-03-30 – 2016-04-08 (×10): 1 mg
  Filled 2016-03-30 (×11): qty 1

## 2016-03-30 MED ORDER — VITAL HIGH PROTEIN PO LIQD
1000.0000 mL | ORAL | Status: DC
  Administered 2016-03-30 – 2016-03-31 (×2): 1000 mL

## 2016-03-30 MED ORDER — MIDAZOLAM HCL 2 MG/2ML IJ SOLN
2.0000 mg | Freq: Once | INTRAMUSCULAR | Status: AC
  Administered 2016-03-30: 2 mg via INTRAVENOUS

## 2016-03-30 MED ORDER — VITAMIN B-1 100 MG PO TABS
100.0000 mg | ORAL_TABLET | Freq: Every day | ORAL | Status: DC
Start: 2016-03-30 — End: 2016-04-09
  Administered 2016-03-30 – 2016-04-08 (×10): 100 mg
  Filled 2016-03-30 (×11): qty 1

## 2016-03-30 MED ORDER — CLONAZEPAM 1 MG PO TABS
1.0000 mg | ORAL_TABLET | Freq: Two times a day (BID) | ORAL | Status: DC
  Administered 2016-03-30 – 2016-04-01 (×6): 1 mg
  Filled 2016-03-30 (×6): qty 1

## 2016-03-30 NOTE — Progress Notes (Signed)
CSW consult acknowledged re: substance abuse. Patient presently intubated and sedated. CSW to assess when medically appropriate. CSW now following.    Lorrine Kin, MSW, LCSW [facility] ED/23M Clinical Social Worker [phone]

## 2016-03-30 NOTE — Progress Notes (Signed)
eLink Physician-Brief Progress Note Patient Name: NICKOLES GREGORI DOB: [birth year] MRN: [id number]   Date of Service  03/30/2016  HPI/Events of Note  Agitation on vent after weaning all day Now on full support, sedation drips at max dosing  eICU Interventions  Bolus versed and fentanyl     Intervention Category Major Interventions: Change in mental status - evaluation and management  Simonne Maffucci 03/30/2016, 6:08 PM

## 2016-03-30 NOTE — Progress Notes (Signed)
[facility] ADULT ICU REPLACEMENT PROTOCOL FOR AM LAB REPLACEMENT ONLY  The patient does apply for the [facility] Adult ICU Electrolyte Replacment Protocol based on the criteria listed below:   1. Is GFR >/= 40 ml/min? Yes.    Patient's GFR today is 42 2. Is urine output >/= 0.5 ml/kg/hr for the last 6 hours? Yes.   Patient's UOP is 0.9 ml/kg/hr 3. Is BUN < 60 mg/dL? Yes.    Patient's BUN today is 47 4. Abnormal electrolyte(s):K 3.4 5. Ordered repletion with: protocol 6. If a panic level lab has been reported, has the CCM MD in charge been notified? No..   Physician:    Ronda Fairly A 03/30/2016 5:07 AM

## 2016-03-30 NOTE — Progress Notes (Signed)
Slowly weaned pt off of sedation medication all day.  Pt was able to open eyes, follow commands, nod appropriately and move all extremities. After this pt's heart rate went into the 140s, SBP into the 170s, restless and tachypnic on the ventilator. Sedation meds given, see flowsheets.

## 2016-03-30 NOTE — Progress Notes (Signed)
PULMONARY / CRITICAL CARE MEDICINE   Name: Jesse Hartman MRN: [id number] DOB: [birth year]    ADMISSION DATE:  03/19/2016 CONSULTATION DATE:  03/22/2016  REFERRING MD:  Dr. Vanita Panda  CHIEF COMPLAINT:  AMS  HISTORY OF PRESENT ILLNESS:   56 year old male with PMH of ETOH abuse (24 pack per day), COPD, Barrett's esophagus, HTN, Seizures, and OSA. He was started on lasix and potassium supplementation approximately 3/12. He was found down by his daughter 3/19 and brought to ED via EMS.  Upon arrival to ED he was hypotensive, which was slow to respond to IVF bolus. He was able to follow commands, but was confused. Adm labs showed AKI with hyperkalemia , cr 4.2  While in CT he had episode of projectile vomiting of coffee ground emesis. Despite IVF resuscitation he remained hypotensive .   STUDIES:  CT head 3/19 > No evidence of acute intracranial abnormality. Generalized cerebral volume loss. Mild paranasal sinusitis of uncertain acuity . CT abd/pelv 3/19 > Stranding around the pancreas compatible with acute pancreatitis. Fatty infiltration of the liver. Distended, fluid-filled stomach. EGD 3/19 > Severe esophagitis with no acute hemorrhage  CULTURES: Blood 3/19 >> 1 /2  S viridans Urine 3/19 > insignificant growth Resp 3/21 >> abundant yeast, PMNs >> reincubated >>  Few yeast Blood 3/21 >> ng Urine 3/21 >> negative  ANTIBIOTICS: vanco 3/19 >> 3/20 vanco 3/22 >> 3/23 Zosyn 3/19 >  SIGNIFICANT EVENTS: 3/19 admit, ARF 3/19 PM cardiac arrest 6 mins, intubated. On pressors.  3/23 Increased infiltrates over the last 24 hours on chest x-ray ? aspiration Changed to propofol on 3/24 3/25  significant cough that causes auto PEEP and subsequently hypotension  LINES/TUBES: CVL 3/19 > ETT 3/19 > L fem art line 3/21 >  SUBJECTIVE/Interval:   Called to bedside emrgently for asynchrony Critically ill, intubated & sedated More hypotensive needing higher levo gtt Good UO    VITAL SIGNS: BP  (!) 116/54   Pulse (!) 106   Temp 98.1 F (36.7 C) (Oral)   Resp 15   Ht 6' (1.829 m)   Wt 289 lb 11 oz (131.4 kg)   SpO2 96%   BMI 39.29 kg/m   HEMODYNAMICS: CVP:  [14 mmHg-15 mmHg] 15 mmHg  VENTILATOR SETTINGS: Vent Mode: PCV FiO2 (%):  [40 %-60 %] 50 % Set Rate:  [12 bmp-14 bmp] 12 bmp Vt Set:  [620 mL] 620 mL PEEP:  [5 cmH20-8 cmH20] 8 cmH20 Pressure Support:  [12 cmH20-22 cmH20] 22 cmH20 Plateau Pressure:  [18 cmH20-28 cmH20] 18 cmH20  INTAKE / OUTPUT: I/O last 3 completed shifts: In: 7512 [I.V.:2462; NG/GT:4800; IV Piggyback:250] Out: 4665 [Urine:4665]  PHYSICAL EXAMINATION:  General:  Ill-appearing man on mechanical ventilation,sedated Neuro:  Sedated, RASS-4 HEENT:  ETT in place, no oral lesions Cardiovascular:  Distant, regular, no murmurs Lungs: Diffuse bilateral crackles and rhonchi, more on the right than on the left, no wheeze, asynchrony with 'double clutching' Abdomen:  Soft, benign, hypoactive bowel sounds Musculoskeletal: no deformity Skin: no rash  LABS:  BMET  Recent Labs Lab 03/28/16 0400 03/29/16 0330 03/30/16 0350  NA 153* 151* 152*  K 3.7 3.4* 3.4*  CL 107 108 108  CO2 36* 35* 34*  BUN 45* 45* 47*  CREATININE 1.80* 1.81* 1.76*  GLUCOSE 160* 174* 198*    Electrolytes  Recent Labs Lab 03/24/16 0435  03/26/16 0350  03/28/16 0400 03/29/16 0330 03/30/16 0350  CALCIUM 7.0*  < > 7.8*  < > 8.2* 8.1*  8.5*  MG  --   < > 1.9  --  2.1  --  2.0  PHOS 3.6  --  4.1  --  3.5  --   --   < > = values in this interval not displayed.  CBC  Recent Labs Lab 03/28/16 0400 03/29/16 0330 03/30/16 0350  WBC 15.5* 14.1* 12.0*  HGB 8.0* 7.8* 7.9*  HCT 25.7* 25.4* 26.0*  PLT 168 172 219    Coag's  Recent Labs Lab 03/31/2016 1318 03/24/16 0047 03/28/16 0400  APTT  --  28  --   INR 1.35  --  1.19    Sepsis Markers  Recent Labs Lab 03/17/2016 1806 03/05/2016 1823 04/02/2016 2124 03/24/2016 2153 03/24/16 0825 03/25/16 0740   LATICACIDVEN 8.8*  --  2.2* 1.87  --   --   PROCALCITON  --  4.16  --   --  11.49 6.16    ABG  Recent Labs Lab 03/26/16 0334 03/27/16 0321 03/29/16 0330  PHART 7.342* 7.476* 7.340*  PCO2ART 68.4* 53.3* 70.5*  PO2ART 233* 62.4* 62.7*    Liver Enzymes  Recent Labs Lab 03/22/2016 1318 03/24/16 0435 03/27/16 0315  AST 93*  --  183*  ALT 51  --  287*  ALKPHOS 48  --  57  BILITOT 0.9  --  1.3*  ALBUMIN 2.4* 1.9* 1.5*    Cardiac Enzymes  Recent Labs Lab 03/24/16 0435 03/24/16 0825 03/24/16 1445  TROPONINI 0.06* 0.07* 0.07*    Glucose  Recent Labs Lab 03/29/16 1255 03/29/16 1652 03/29/16 1920 03/30/16 0017 03/30/16 0349 03/30/16 0714  GLUCAP 159* 167* 160* 165* 196* 179*    Imaging Dg Chest Port 1 View  Result Date: 03/30/2016 CLINICAL DATA:  Respiratory failure. EXAM: PORTABLE CHEST 1 VIEW COMPARISON:  03/29/2016. FINDINGS: Endotracheal tube, feeding tube, left IJ line stable position. Heart size stable. Stable bilateral pulmonary infiltrates. Small left pleural effusion. No pneumothorax. IMPRESSION: 1. Lines and tubes in stable position. 2. Persistent bilateral pulmonary infiltrates and/or edema. No interim change from prior exam. Tiny left pleural effusion . Electronically Signed   By: Marcello Moores  Register   On: 03/30/2016 06:57      DISCUSSION: 57 year old male admitted with AMS and AKI. Was improving but suffered massive coffee ground emesis and subsequent brief cardiac arrest. Intubated and on pressors. Slow improvement MS, vent needs, renal fxn  ASSESSMENT / PLAN:  PULMONARY A: Acute hypoxemic respiratory failure: suspect he aspirated on emesis, CXR and exam also concerning ALI vs edema. COPD without acute exacerbation. OSA. BL PNA -favor Aspiration P:   Vent settings reviewed & adjusted - increase trigger to -5, changed to PCV 15/5 Scheduled bronchodilators Consider bronchoscopy for respiratory culture if rpt fever  CARDIOVASCULAR A:  Shock:  hemorrhagic, +/- septic  H/o HTN, CHF. P:  Echo pending for strep viridans noted on one blood culture Wean norepinephrine as able Goal CVP 10-12 range  RENAL A:   Acute renal failure - presumed ATN in setting dehydration/ARB/NSAIDs/shock. Improving Hyperkalemia - resolved. AG acidosis secondary to lactic/renal failure - resolved. Hyponatremia dehydration / beer potomania - resolved. Hypernatremia P:   Follow BMP, urine output Good UO with  Lasix on 3/25 Continue current free water repleted K Appreciate Nephrology assistance, now signed off.   GASTROINTESTINAL A:   GIB - severe esophagitis on EGD. No acute bleed so no interventions performed. Acute pancreatitis - per CT but lipase / amylase normal, suspect more chronic . H/o Barrett's esophagus. Constipation P:  Resume tube feeding @ 20/h Hold off on GI consultation as his hemoglobin has stabilized Protonix twice a day. Add Dulcolax suppository  HEMATOLOGIC A:   ABLA secondary to GIB, some continued blood loss, no hematemesis, no melena yet P:  Follow CBC Transfusion goal Hgb > 7  INFECTIOUS A:   Aspiration PNA, LLL Yeast resp cx, unclear significance  S. Viridans 1 of 2 blood cx, repeat negative so far P:   Continue Zosyn until 3/29, then ancef to complete 14 ds if resp improvement Vancomycin stopped on 3/23 Echocardiogram pending   ENDOCRINE A:   Hyperglycemia. P:   SSI  NEUROLOGIC A:   Acute metabolic encephalopathy: multifactorial - withdrawal, uremia, hepatic, sedation. At risk ETOH/benzo withdrawal. P:   RASS goal -1 Continue propofol and fentanyl drips Resume clonazepam 1 bid  FAMILY  - Updates:  updated daughter  3/24  - Inter-disciplinary family meet or Palliative Care meeting due by: 3/25  Independent CC time 53 minutes  Kara Mead MD. [facility]. [place] [facility] Pager [phone] If no response call [phone]    03/30/2016, 8:57 AM

## 2016-03-31 ENCOUNTER — Inpatient Hospital Stay (HOSPITAL_COMMUNITY): Payer: Medicare Other

## 2016-03-31 LAB — TRIGLYCERIDES: TRIGLYCERIDES: 476 mg/dL — AB (ref <150)

## 2016-03-31 LAB — GLUCOSE, CAPILLARY
GLUCOSE-CAPILLARY: 130 mg/dL — AB (ref 65–99)
GLUCOSE-CAPILLARY: 128 mg/dL — AB (ref 65–99)
GLUCOSE-CAPILLARY: 135 mg/dL — AB (ref 65–99)
Glucose-Capillary: 130 mg/dL — ABNORMAL HIGH (ref 65–99)
Glucose-Capillary: 130 mg/dL — ABNORMAL HIGH (ref 65–99)
Glucose-Capillary: 133 mg/dL — ABNORMAL HIGH (ref 65–99)

## 2016-03-31 LAB — COMPREHENSIVE METABOLIC PANEL
ALT: 93 U/L — AB (ref 17–63)
AST: 100 U/L — AB (ref 15–41)
Albumin: 1.5 g/dL — ABNORMAL LOW (ref 3.5–5.0)
Alkaline Phosphatase: 295 U/L — ABNORMAL HIGH (ref 38–126)
Anion gap: 12 (ref 5–15)
BUN: 41 mg/dL — AB (ref 6–20)
CO2: 32 mmol/L (ref 22–32)
CREATININE: 1.62 mg/dL — AB (ref 0.61–1.24)
Calcium: 8.6 mg/dL — ABNORMAL LOW (ref 8.9–10.3)
Chloride: 107 mmol/L (ref 101–111)
GFR calc Af Amer: 54 mL/min — ABNORMAL LOW (ref >60)
GFR calc non Af Amer: 47 mL/min — ABNORMAL LOW (ref >60)
Glucose, Bld: 151 mg/dL — ABNORMAL HIGH (ref 65–99)
Potassium: 3.5 mmol/L (ref 3.5–5.1)
SODIUM: 151 mmol/L — AB (ref 135–145)
Total Bilirubin: 2.8 mg/dL — ABNORMAL HIGH (ref 0.3–1.2)
Total Protein: 6 g/dL — ABNORMAL LOW (ref 6.5–8.1)

## 2016-03-31 LAB — POCT I-STAT 3, ART BLOOD GAS (G3+)
Acid-Base Excess: 7 mmol/L — ABNORMAL HIGH (ref 0.0–2.0)
Bicarbonate: 33 mmol/L — ABNORMAL HIGH (ref 20.0–28.0)
O2 Saturation: 86 %
PCO2 ART: 52.8 mmHg — AB (ref 32.0–48.0)
PH ART: 7.404 (ref 7.350–7.450)
TCO2: 35 mmol/L (ref 0–100)
pO2, Arterial: 52 mmHg — ABNORMAL LOW (ref 83.0–108.0)

## 2016-03-31 LAB — CBC
HCT: 27 % — ABNORMAL LOW (ref 39.0–52.0)
Hemoglobin: 8.4 g/dL — ABNORMAL LOW (ref 13.0–17.0)
MCH: 31.3 pg (ref 26.0–34.0)
MCHC: 31.1 g/dL (ref 30.0–36.0)
MCV: 100.7 fL — AB (ref 78.0–100.0)
PLATELETS: 268 10*3/uL (ref 150–400)
RBC: 2.68 MIL/uL — ABNORMAL LOW (ref 4.22–5.81)
RDW: 17.2 % — AB (ref 11.5–15.5)
WBC: 12.3 10*3/uL — ABNORMAL HIGH (ref 4.0–10.5)

## 2016-03-31 MED ORDER — BISACODYL 10 MG RE SUPP
10.0000 mg | Freq: Once | RECTAL | Status: AC
  Administered 2016-03-31: 10 mg via RECTAL
  Filled 2016-03-31: qty 1

## 2016-03-31 MED ORDER — SODIUM CHLORIDE 0.9 % IV SOLN
0.0000 ug/kg/h | INTRAVENOUS | Status: DC
  Administered 2016-03-31: 1.2 ug/kg/h via INTRAVENOUS
  Filled 2016-03-31: qty 2

## 2016-03-31 MED ORDER — DEXMEDETOMIDINE HCL IN NACL 400 MCG/100ML IV SOLN
0.0000 ug/kg/h | INTRAVENOUS | Status: DC
  Administered 2016-03-31 (×2): 1.2 ug/kg/h via INTRAVENOUS
  Filled 2016-03-31 (×2): qty 100

## 2016-03-31 MED ORDER — PROPOFOL 1000 MG/100ML IV EMUL
INTRAVENOUS | Status: AC
  Administered 2016-03-31: 40 ug/kg/min via INTRAVENOUS
  Filled 2016-03-31: qty 100

## 2016-03-31 MED ORDER — PROPOFOL 1000 MG/100ML IV EMUL
5.0000 ug/kg/min | INTRAVENOUS | Status: DC
  Administered 2016-03-31: 50 ug/kg/min via INTRAVENOUS
  Administered 2016-03-31: 40 ug/kg/min via INTRAVENOUS
  Administered 2016-03-31 – 2016-04-01 (×2): 50 ug/kg/min via INTRAVENOUS
  Administered 2016-04-01: 40 ug/kg/min via INTRAVENOUS
  Administered 2016-04-01: 60 ug/kg/min via INTRAVENOUS
  Administered 2016-04-01: 40 ug/kg/min via INTRAVENOUS
  Administered 2016-04-01 (×2): 60 ug/kg/min via INTRAVENOUS
  Administered 2016-04-01: 40 ug/kg/min via INTRAVENOUS
  Administered 2016-04-01: 20 ug/kg/min via INTRAVENOUS
  Administered 2016-04-01: 40 ug/kg/min via INTRAVENOUS
  Administered 2016-04-02: 70 ug/kg/min via INTRAVENOUS
  Administered 2016-04-02: 40 ug/kg/min via INTRAVENOUS
  Administered 2016-04-02: 50 ug/kg/min via INTRAVENOUS
  Filled 2016-03-31 (×14): qty 100

## 2016-03-31 NOTE — Progress Notes (Signed)
eLink Physician-Brief Progress Note Patient Name: Jesse Hartman DOB: 04-26-1960 MRN: [id number]   Date of Service  03/31/2016  HPI/Events of Note  increased WOB and decreased o2 sats Very agitated  eICU Interventions  Will restart propofol Stop precedex Primary team to assess aggressive PRN meds tomorrow AM     Intervention Category Evaluation Type: Other  Flora Lipps 03/31/2016, 6:11 PM

## 2016-03-31 NOTE — Progress Notes (Signed)
Nutrition Follow-up  DOCUMENTATION CODES:   Obesity unspecified  INTERVENTION:    Recommend increasing Vital High Protein by 10 ml every 4 hours to goal rate of 75 ml/h (1800 ml per day)  Provides 1800 kcal, 158 gm protein, 1505 ml free water daily  NUTRITION DIAGNOSIS:   Inadequate oral intake related to inability to eat as evidenced by NPO status.  Ongoing  GOAL:   Provide needs based on ASPEN/SCCM guidelines  Progressing  MONITOR:   Vent status, Labs, I & O's  ASSESSMENT:   56 year old male admitted with AMS and AKI. Was improving but suffered massive coffee ground emesis and subsequent brief cardiac arrest. Required intubation on 3/19.   Cortrak tube in place with tip in the duodenum.  Patient has been receiving trickle TF, tolerating okay per RN. Receiving 400 ml free water flushes every 6 hours. Patient remains intubated on ventilator support MV: 16.4 L/min Temp (24hrs), Avg:98.9 F (37.2 C), Min:98.1 F (36.7 C), Max:100.9 F (38.3 C)  Labs reviewed. Sodium 151 (H) CBG's: 128-133 Medications reviewed and include folic acid and thiamine.  Diet Order:  Diet NPO time specified  Skin:  Reviewed, no issues  Last BM:  3/20  Height:   Ht Readings from Last 1 Encounters:  03/24/16 6' (1.829 m)    Weight:   Wt Readings from Last 1 Encounters:  03/31/16 289 lb 14.5 oz (131.5 kg)    Ideal Body Weight:  80.9 kg  BMI:  Body mass index is 39.32 kg/m.  Estimated Nutritional Needs:   Kcal:  1450-1850  Protein:  162 gm  Fluid:  2 L  EDUCATION NEEDS:   No education needs identified at this time  Molli Barrows, [place], [place], [place] Pager [phone] After Hours Pager [phone]

## 2016-03-31 NOTE — Progress Notes (Signed)
RN notified me that vent changes made by MD- peep 10, 60% fio2.

## 2016-03-31 NOTE — Progress Notes (Signed)
Notified MD Dr. Mortimer Fries that the patient had received 2mg  of versed and was maxed on fentanyl and precedex and still extremely agitated and desating. MD will put in orders to DC precedex and restart propofol.

## 2016-03-31 NOTE — Progress Notes (Signed)
PULMONARY / CRITICAL CARE MEDICINE   Name: Jesse Hartman MRN: [id number] DOB: 02-Apr-1960    ADMISSION DATE:  03/20/2016 CONSULTATION DATE:  03/25/2016  REFERRING MD:  Dr. Vanita Panda  CHIEF COMPLAINT:  AMS  HISTORY OF PRESENT ILLNESS:   56 year old male with PMH of ETOH abuse (24 pack per day), COPD, Barrett's esophagus, HTN, Seizures, and OSA. He was started on lasix and potassium supplementation approximately 3/12. He was found down by his daughter 3/19 and brought to ED via EMS,hypotensive, with AKI & hyperkalemia , cr 4.2  While in CT he had episode of projectile vomiting of coffee ground emesis. Despite IVF resuscitation he remained hypotensive . 3/19 PM cardiac arrest 6 mins, intubated. On pressors.    STUDIES:  CT head 3/19 > No evidence of acute intracranial abnormality. Generalized cerebral volume loss. Mild paranasal sinusitis of uncertain acuity . CT abd/pelv 3/19 > Stranding around the pancreas compatible with acute pancreatitis. Fatty infiltration of the liver. Distended, fluid-filled stomach. EGD 3/19 > Severe esophagitis with no acute hemorrhage  CULTURES: Blood 3/19 >> 1 /2  S viridans Urine 3/19 > insignificant growth Resp 3/21 >> abundant yeast, PMNs >> reincubated >>  Few yeast Blood 3/21 >> ng Urine 3/21 >> negative  ANTIBIOTICS: vanco 3/19 >> 3/20 vanco 3/22 >> 3/23 Zosyn 3/19 >  SIGNIFICANT EVENTS: 3/19 admit, ARF 3/19 PM cardiac arrest 6 mins, intubated. On pressors.  3/23 Increased infiltrates over the last 24 hours on chest x-ray ? aspiration Changed to propofol on 3/24 3/25  significant cough that causes auto PEEP and subsequently hypotension  LINES/TUBES: CVL 3/19 > ETT 3/19 > L fem art line 3/21 >  SUBJECTIVE/Interval:  Remains Critically ill, intubated & sedated On lower levo gtt Good UO    VITAL SIGNS: BP (!) 71/49   Pulse (!) 110   Temp 98.8 F (37.1 C) (Core (Comment))   Resp 11   Ht 6' (1.829 m)   Wt 289 lb 14.5 oz (131.5 kg)    SpO2 96%   BMI 39.32 kg/m   HEMODYNAMICS: CVP:  [14 mmHg-17 mmHg] 17 mmHg  VENTILATOR SETTINGS: Vent Mode: PCV FiO2 (%):  [50 %-60 %] 60 % Set Rate:  [12 bmp] 12 bmp PEEP:  [8 cmH20] 8 cmH20 Plateau Pressure:  [11 cmH20-23 cmH20] 19 cmH20  INTAKE / OUTPUT: I/O last 3 completed shifts: In: 5762 [I.V.:2624.5; NG/GT:2825; IV Piggyback:312.5] Out: 8250 [Urine:4145]  PHYSICAL EXAMINATION:  General:  Ill-appearing man on mechanical ventilation,sedated Neuro:  Sedated, RASS-3 HEENT:  ETT in place, no oral lesions Cardiovascular:  Distant, regular, no murmurs Lungs: Diffuse bilateral crackles and rhonchi, more on the right than on the left, no wheeze, asynchrony with 'double clutching' intermittent Abdomen:  Soft, benign, hypoactive bowel sounds Musculoskeletal: no deformity Skin: no rash  LABS:  BMET  Recent Labs Lab 03/29/16 0330 03/30/16 0350 03/31/16 0449  NA 151* 152* 151*  K 3.4* 3.4* 3.5  CL 108 108 107  CO2 35* 34* 32  BUN 45* 47* 41*  CREATININE 1.81* 1.76* 1.62*  GLUCOSE 174* 198* 151*    Electrolytes  Recent Labs Lab 03/26/16 0350  03/28/16 0400 03/29/16 0330 03/30/16 0350 03/31/16 0449  CALCIUM 7.8*  < > 8.2* 8.1* 8.5* 8.6*  MG 1.9  --  2.1  --  2.0  --   PHOS 4.1  --  3.5  --   --   --   < > = values in this interval not displayed.  CBC  Recent Labs Lab 03/29/16 0330 03/30/16 0350 03/31/16 0449  WBC 14.1* 12.0* 12.3*  HGB 7.8* 7.9* 8.4*  HCT 25.4* 26.0* 27.0*  PLT 172 219 268    Coag's  Recent Labs Lab 03/28/16 0400  INR 1.19    Sepsis Markers  Recent Labs Lab 03/25/16 0740  PROCALCITON 6.16    ABG  Recent Labs Lab 03/29/16 0330 03/30/16 1215 03/31/16 0405  PHART 7.340* 7.411 7.404  PCO2ART 70.5* 62.0* 52.8*  PO2ART 62.7* 56.0* 52.0*    Liver Enzymes  Recent Labs Lab 03/27/16 0315 03/31/16 0449  AST 183* 100*  ALT 287* 93*  ALKPHOS 57 295*  BILITOT 1.3* 2.8*  ALBUMIN 1.5* 1.5*    Cardiac  Enzymes  Recent Labs Lab 03/24/16 1445  TROPONINI 0.07*    Glucose  Recent Labs Lab 03/30/16 1203 03/30/16 1514 03/30/16 1924 03/30/16 2350 03/31/16 0332 03/31/16 0746  GLUCAP 160* 116* 114* 152* 130* 128*    Imaging Dg Chest Port 1 View  Result Date: 03/31/2016 CLINICAL DATA:  Respiratory failure. EXAM: PORTABLE CHEST 1 VIEW COMPARISON:  03/30/2016. FINDINGS: Endotracheal tube, feeding tube in stable position. Heart size normal. Diffuse bilateral pulmonary infiltrates are again noted. No pleural effusion or pneumothorax. IMPRESSION: 1.  Endotracheal tube and feeding tube in stable position. 2. Diffuse bilateral pulmonary infiltrates again noted. No change from prior exam. Electronically Signed   By: Marcello Moores  Register   On: 03/31/2016 06:58      DISCUSSION: 56 year old male admitted with AMS and AKI. Was improving but suffered massive coffee ground emesis and subsequent brief cardiac arrest. Intubated and on pressors. Slow improvement MS, vent needs, renal fxn  ASSESSMENT / PLAN:  PULMONARY A: Acute hypoxemic respiratory failure: suspect he aspirated on emesis, CXR and exam also concerning ALI vs edema. COPD without acute exacerbation. OSA. BL PNA -favor Aspiration P:   Vent settings reviewed & adjusted - keep trigger to -5, changed to PCV 15 , increase PEEP 10 Scheduled bronchodilators Consider bronchoscopy for respiratory culture if rpt fever  CARDIOVASCULAR A:  Shock: hemorrhagic, +/- septic  H/o HTN, CHF. P:  Wean norepinephrine as able Goal CVP 10-12 range  RENAL A:   Acute renal failure - presumed ATN in setting dehydration/ARB/NSAIDs/shock. Improving Hyperkalemia - resolved. AG acidosis secondary to lactic/renal failure - resolved. Hyponatremia dehydration / beer potomania - resolved. Hypernatremia P:   Follow BMP, urine output Continue current free water replete K Appreciate Nephrology assistance, now signed off.   GASTROINTESTINAL A:   GIB -  severe esophagitis on EGD. No acute bleed so no interventions performed. Acute pancreatitis - per CT but lipase / amylase normal, suspect more chronic . H/o Barrett's esophagus. Constipation P:   Resume tube feeding @ 20/h, rpt XR abd to check post pyloric position Hold off on GI consultation as his hemoglobin has stabilized Protonix twice a day. Add Dulcolax suppository/ if no BM x 24h then lactulose  HEMATOLOGIC A:   ABLA secondary to GIB, some continued blood loss, no hematemesis, no melena yet P:  Follow CBC Transfusion goal Hgb > 7  INFECTIOUS A:   Aspiration PNA, LLL Yeast resp cx, unclear significance  S. Viridans 1 of 2 blood cx, repeat negative so far P:   Continue Zosyn until 3/29, then ancef to complete 14 ds if resp improvement Vancomycin stopped on 3/23    ENDOCRINE A:   Hyperglycemia. P:   SSI  NEUROLOGIC A:   Acute metabolic encephalopathy: multifactorial - withdrawal, uremia, hepatic, sedation.  At risk ETOH/benzo withdrawal. P:   RASS goal -1 d propofol (TGs rising), add precedex and minimise fentanyl drips Resume clonazepam 1 bid  FAMILY  - Updates:  updated daughter  3/27  - Inter-disciplinary family meet or Palliative Care meeting due by: 3/25  Independent CC time 61 minutes  Kara Mead MD. [facility]. [place] [facility] Critical [facility] Pager [phone] If no response call [phone]    03/31/2016, 10:40 AM

## 2016-03-31 NOTE — Progress Notes (Signed)
CSW contacted Patient's daughter , Juvencio Verdi, via T/C at RN's request. CSW has scheduled to meet with Adria at 11AM tomorrow regarding disposition questions and POA questions.    Lorrine Kin, MSW, LCSW [facility] ED/42M Clinical Social Worker [phone]

## 2016-04-01 ENCOUNTER — Inpatient Hospital Stay (HOSPITAL_COMMUNITY): Payer: Medicare Other

## 2016-04-01 LAB — GLUCOSE, CAPILLARY
GLUCOSE-CAPILLARY: 133 mg/dL — AB (ref 65–99)
Glucose-Capillary: 111 mg/dL — ABNORMAL HIGH (ref 65–99)
Glucose-Capillary: 134 mg/dL — ABNORMAL HIGH (ref 65–99)
Glucose-Capillary: 143 mg/dL — ABNORMAL HIGH (ref 65–99)

## 2016-04-01 LAB — POCT I-STAT 3, ART BLOOD GAS (G3+)
Acid-Base Excess: 5 mmol/L — ABNORMAL HIGH (ref 0.0–2.0)
Bicarbonate: 32.8 mmol/L — ABNORMAL HIGH (ref 20.0–28.0)
O2 Saturation: 89 %
PH ART: 7.309 — AB (ref 7.350–7.450)
Patient temperature: 97.8
TCO2: 35 mmol/L (ref 0–100)
pCO2 arterial: 64.9 mmHg — ABNORMAL HIGH (ref 32.0–48.0)
pO2, Arterial: 62 mmHg — ABNORMAL LOW (ref 83.0–108.0)

## 2016-04-01 LAB — BASIC METABOLIC PANEL
Anion gap: 10 (ref 5–15)
BUN: 45 mg/dL — AB (ref 6–20)
CO2: 31 mmol/L (ref 22–32)
Calcium: 8.5 mg/dL — ABNORMAL LOW (ref 8.9–10.3)
Chloride: 108 mmol/L (ref 101–111)
Creatinine, Ser: 1.78 mg/dL — ABNORMAL HIGH (ref 0.61–1.24)
GFR, EST AFRICAN AMERICAN: 48 mL/min — AB (ref >60)
GFR, EST NON AFRICAN AMERICAN: 42 mL/min — AB (ref >60)
Glucose, Bld: 111 mg/dL — ABNORMAL HIGH (ref 65–99)
POTASSIUM: 3.9 mmol/L (ref 3.5–5.1)
SODIUM: 149 mmol/L — AB (ref 135–145)

## 2016-04-01 LAB — CBC
HCT: 25.9 % — ABNORMAL LOW (ref 39.0–52.0)
HEMOGLOBIN: 8.2 g/dL — AB (ref 13.0–17.0)
MCH: 31.2 pg (ref 26.0–34.0)
MCHC: 31.7 g/dL (ref 30.0–36.0)
MCV: 98.5 fL (ref 78.0–100.0)
PLATELETS: 348 10*3/uL (ref 150–400)
RBC: 2.63 MIL/uL — AB (ref 4.22–5.81)
RDW: 16.9 % — ABNORMAL HIGH (ref 11.5–15.5)
WBC: 12.2 10*3/uL — ABNORMAL HIGH (ref 4.0–10.5)

## 2016-04-01 MED ORDER — VITAL HIGH PROTEIN PO LIQD
1000.0000 mL | ORAL | Status: DC
  Administered 2016-04-01: 1000 mL
  Administered 2016-04-02

## 2016-04-01 MED ORDER — PANTOPRAZOLE SODIUM 40 MG PO PACK
40.0000 mg | PACK | Freq: Two times a day (BID) | ORAL | Status: DC
  Administered 2016-04-01 – 2016-04-03 (×5): 40 mg
  Filled 2016-04-01 (×6): qty 20

## 2016-04-01 MED ORDER — LACTULOSE 10 GM/15ML PO SOLN
20.0000 g | Freq: Two times a day (BID) | ORAL | Status: AC
  Administered 2016-04-01 (×2): 20 g via ORAL
  Filled 2016-04-01 (×2): qty 30

## 2016-04-01 MED ORDER — QUETIAPINE FUMARATE 100 MG PO TABS
100.0000 mg | ORAL_TABLET | Freq: Two times a day (BID) | ORAL | Status: DC
  Administered 2016-04-01 (×2): 100 mg
  Filled 2016-04-01 (×3): qty 1

## 2016-04-01 NOTE — Clinical Social Work Note (Addendum)
Clinical Social Work Assessment  Patient Details  Name: Jesse Hartman MRN: [id number] Date of Birth: [birth year]  Date of referral:  04/01/16               Reason for consult:  Discharge Planning, Substance Use/ETOH Abuse                Permission sought to share information with:  Family Supports, Customer service manager Permission granted to share information::  No (Patient intubated)  Name::     Ramzi Brathwaite (Daughter) [phone]   Agency::     Relationship::     Contact Information:     Housing/Transportation Living arrangements for the past 2 months:  Apartment Source of Information:  Adult Children Patient Interpreter Needed:  None Criminal Activity/Legal Involvement Pertinent to Current Situation/Hospitalization:  No - Comment as needed Significant Relationships:  Adult Children, Parents, Siblings Lives with:  Self Do you feel safe going back to the place where you live?  No Need for family participation in patient care:  Yes (Comment)  Care giving concerns:  Patient with alcohol abuse. Patient also lives at home alone. Patient's daughter reports that she does not want Patient to return to that apartment post discharge due to the apartment's living conditions.    Social Worker assessment / plan: 56 year old male with PMH of ETOH abuse (24 pack per day), COPD, Barrett's esophagus, HTN, Seizures, and OSA. He was started on lasix and potassium supplementation approximately 3/12. He was found down by his daughter 3/19 and brought to ED via EMS,hypotensive, with AKI & hyperkalemia , cr 4.2  While in CT he had episode of projectile vomiting of coffee ground emesis. CSW engaged with Patient's daughter, Gatsby Chismar, in [place] conference room. CSW introduced self, role of CSW, and addressed Patient's daughter's questions regarding discharge planning. CSW discussed with Patient's daughter the discharge plan of vent/trach SNF should Patient not be able to be weaned from the ventilator. CSW  also discussed local trach SNF facilities should Patient receive tracheostomy and be able to be weaned off of the vent. Patient's daughter reports understanding that disposition is still pending at this time as medical team continues to work to progress Patient. CSW provided Patient's daughter with information regarding obtaining emergency guardianship of Patient as well as discussed that [place] documentation is patient initiated and Patient would need to be competent and agreeable in order for documentation to be completed. Daughter reports that her grandmother and aunt, (Patient's mother and sister) are very supportive of Patient. She notes that her sister is a recovering substance abuser and is agreement for her to make Patient's medical decisions. Patient's daughter does acknowledge that Patient has a "drinking problem" and reports that his drink of choice is beer. CSW provided Patient's daughter with emotional support and brief supportive counseling. CSW continues to follow for disposition needs.   Employment status:  Disabled (Comment on whether or not currently receiving Disability) Insurance information:  Medicaid In [place] PT Recommendations:  Not assessed at this time Information / Referral to community resources:  [place]  Patient/Family's Response to care:  Patient's daughter reports appreciation for resources and emotional support provided. Patient's daughter reports that she does not have a support system and wants to feel like she is "doing something".   Patient/Family's Understanding of and Emotional Response to Diagnosis, Current Treatment, and Prognosis:  Patient's daughter very understanding of Patient's diagnosis, current treatment, and prognosis. Patient's daughter expresses understanding that Patient  will need rehabilitation at discharge and was receptive to discharge options provided to her.   Emotional Assessment Appearance:  Appears stated  age Attitude/Demeanor/Rapport:  Unable to Assess Affect (typically observed):  Unable to Assess Orientation:   (Unable to Assess- Patient intubated) Alcohol / Substance use:  Alcohol Use Psych involvement (Current and /or in the community):  Outpatient Provider  Discharge Needs  Concerns to be addressed:  Discharge Planning Concerns, Substance Abuse Concerns, Care Coordination Readmission within the last 30 days:  No Current discharge risk:  Lives alone, Substance Abuse Barriers to Discharge:  Continued Medical Work up   Lind Covert, LCSW 04/01/2016, 11:58 AM

## 2016-04-01 NOTE — Progress Notes (Signed)
PULMONARY / CRITICAL CARE MEDICINE   Name: Jesse Hartman MRN: [id number] DOB: 28-Oct-1960    ADMISSION DATE:  03/31/2016 CONSULTATION DATE:  03/16/2016  REFERRING MD:  Dr. Vanita Panda  CHIEF COMPLAINT:  AMS  HISTORY OF PRESENT ILLNESS:   56 year old male with PMH of ETOH abuse (24 pack per day), COPD, Barrett's esophagus, HTN, Seizures, and OSA. He was started on lasix and potassium supplementation approximately 3/12. He was found down by his daughter 3/19 and brought to ED via EMS,hypotensive, with AKI & hyperkalemia , cr 4.2  While in CT he had episode of projectile vomiting of coffee ground emesis. Despite IVF resuscitation he remained hypotensive . 3/19 PM cardiac arrest 6 mins, intubated. On pressors.    STUDIES:  CT head 3/19 > No evidence of acute intracranial abnormality. Generalized cerebral volume loss. Mild paranasal sinusitis of uncertain acuity . CT abd/pelv 3/19 > Stranding around the pancreas compatible with acute pancreatitis. Fatty infiltration of the liver. Distended, fluid-filled stomach. EGD 3/19 > Severe esophagitis with no acute hemorrhage  CULTURES: Blood 3/19 >> 1 /2  S viridans Urine 3/19 > insignificant growth Resp 3/21 >> abundant yeast, PMNs >> reincubated >>  Few yeast Blood 3/21 >> ng Urine 3/21 >> negative  ANTIBIOTICS: vanco 3/19 >> 3/20 vanco 3/22 >> 3/23 Zosyn 3/19 >  SIGNIFICANT EVENTS: 3/19 admit, ARF 3/19 PM cardiac arrest 6 mins, intubated. On pressors.  3/23 Increased infiltrates over the last 24 hours on chest x-ray ? aspiration Changed to propofol on 3/24 3/25  significant cough that causes auto PEEP and subsequently hypotension  LINES/TUBES: CVL 3/19 > ETT 3/19 > L fem art line 3/21 >3/28  SUBJECTIVE/Interval:  Remains Critically ill, intubated & sedated Continues to have asynchrony with double clutching on vent On low dose levo gtt Good UO    VITAL SIGNS: BP 122/65   Pulse (!) 114   Temp 99.4 F (37.4 C) (Rectal)   Resp 15    Ht 6' (1.829 m)   Wt 291 lb 7.2 oz (132.2 kg)   SpO2 (!) 87%   BMI 39.53 kg/m   HEMODYNAMICS: CVP:  [14 mmHg] 14 mmHg  VENTILATOR SETTINGS: Vent Mode: PCV FiO2 (%):  [50 %-60 %] 60 % Set Rate:  [12 bmp] 12 bmp PEEP:  [10 cmH20] 10 cmH20 Plateau Pressure:  [20 cmH20-24 cmH20] 23 cmH20  INTAKE / OUTPUT: I/O last 3 completed shifts: In: 3888.3 [I.V.:2598.3; NG/GT:1140; IV Piggyback:150] Out: 3770 [Urine:3770]  PHYSICAL EXAMINATION:  General:  Ill-appearing man on mechanical ventilation,sedated Neuro:  Sedated, RASS-2 HEENT:  ETT in place, no oral lesions Cardiovascular:  Distant, regular, no murmurs Lungs: Diffuse bilateral crackles and rhonchi, more on the right than on the left, no wheeze, asynchrony with 'double clutching' intermittent Abdomen:  Soft, benign, hypoactive bowel sounds Musculoskeletal: no deformity Skin: no rash  LABS:  BMET  Recent Labs Lab 03/30/16 0350 03/31/16 0449 04/01/16 0355  NA 152* 151* 149*  K 3.4* 3.5 3.9  CL 108 107 108  CO2 34* 32 31  BUN 47* 41* 45*  CREATININE 1.76* 1.62* 1.78*  GLUCOSE 198* 151* 111*    Electrolytes  Recent Labs Lab 03/26/16 0350  03/28/16 0400  03/30/16 0350 03/31/16 0449 04/01/16 0355  CALCIUM 7.8*  < > 8.2*  < > 8.5* 8.6* 8.5*  MG 1.9  --  2.1  --  2.0  --   --   PHOS 4.1  --  3.5  --   --   --   --   < > =  values in this interval not displayed.  CBC  Recent Labs Lab 03/30/16 0350 03/31/16 0449 04/01/16 0355  WBC 12.0* 12.3* 12.2*  HGB 7.9* 8.4* 8.2*  HCT 26.0* 27.0* 25.9*  PLT 219 268 348    Coag's  Recent Labs Lab 03/28/16 0400  INR 1.19    Sepsis Markers No results for input(s): LATICACIDVEN, PROCALCITON, O2SATVEN in the last 168 hours.  ABG  Recent Labs Lab 03/29/16 0330 03/30/16 1215 03/31/16 0405  PHART 7.340* 7.411 7.404  PCO2ART 70.5* 62.0* 52.8*  PO2ART 62.7* 56.0* 52.0*    Liver Enzymes  Recent Labs Lab 03/27/16 0315 03/31/16 0449  AST 183* 100*   ALT 287* 93*  ALKPHOS 57 295*  BILITOT 1.3* 2.8*  ALBUMIN 1.5* 1.5*    Cardiac Enzymes No results for input(s): TROPONINI, PROBNP in the last 168 hours.  Glucose  Recent Labs Lab 03/31/16 1222 03/31/16 1651 03/31/16 1942 03/31/16 2336 04/01/16 0343 04/01/16 0730  GLUCAP 133* 130* 135* 130* 111* 133*    Imaging Dg Chest Port 1 View  Result Date: 04/01/2016 CLINICAL DATA:  Acute respiratory shortness breath EXAM: PORTABLE CHEST 1 VIEW COMPARISON:  Portable chest x-ray of March 25, 2026 FINDINGS: It remained fused and fluid interstitial and alveolar opacities bilaterally. These are slightly more conspicuous today especially on the left. The cardiac silhouette is not enlarged. The pulmonary vascularity remains indistinct. The endotracheal tube tip lies approximately 4.6 cm above the carina. The feeding tube tip projects below the inferior margin of the image. The right internal jugular venous catheter tip projects at the junction of the internal jugular vein with the left subclavian vein. IMPRESSION: Slight interval worsening in confluent airspace opacities especially on the left. The support tubes are in stable position. Electronically Signed   By: Bralin  Martinique M.D.   On: 04/01/2016 07:08   Dg Abd Portable 1v  Result Date: 03/31/2016 CLINICAL DATA:  Abdominal distension EXAM: PORTABLE ABDOMEN - 1 VIEW COMPARISON:  03/26/2016 FINDINGS: Feeding catheter is noted within the third portion of the duodenum. Scattered large and small bowel gas is noted. No obstructive changes are seen. Postsurgical changes in the lower lumbar spine are again noted. IMPRESSION: No obstructive changes. Electronically Signed   By: Inez Catalina M.D.   On: 03/31/2016 12:30      DISCUSSION: 56 year old male admitted with AMS and AKI. Was improving but suffered massive coffee ground emesis and subsequent brief cardiac arrest. IDeveloped ARDS & shock. Slow improvement MS, vent needs, renal fxn  ASSESSMENT /  PLAN:  PULMONARY A: Acute hypoxemic respiratory failure: suspect he aspirated on emesis, CXR and exam also concerning ALI vs edema. COPD without acute exacerbation. OSA. BL PNA -favor Aspiration P:   Vent settings reviewed & adjusted - keep trigger to -5, ct PCV 15 ,  PEEP 10 Scheduled bronchodilators   CARDIOVASCULAR A:  Shock: hemorrhagic, +/- septic  H/o HTN, CHF. P:  Wean norepinephrine as able Goal CVP 10-12 range  RENAL A:   Acute renal failure - presumed ATN in setting dehydration/ARB/NSAIDs/shock. Improving Hyperkalemia - resolved. AG acidosis secondary to lactic/renal failure - resolved. Hyponatremia dehydration / beer potomania - resolved. Hypernatremia P:   Follow BMP, urine output Continue current free water replete K Nephrology signed off.   GASTROINTESTINAL A:   GIB - severe esophagitis on EGD. No acute bleed so no interventions performed. Acute pancreatitis - per CT but lipase / amylase normal, suspect more chronic . H/o Barrett's esophagus. Constipation P:   Increase  tube feeding @ 30/h Hold off on GI consultation as his hemoglobin has stabilized Protonix twice a day. No BM with Dulcolax suppository, hence lactulose  HEMATOLOGIC A:   ABLA secondary to GIB, some continued blood loss, no hematemesis, no melena yet P:  Follow CBC Transfusion goal Hgb > 7  INFECTIOUS A:   Aspiration PNA, LLL Yeast resp cx, unclear significance  S. Viridans 1 of 2 blood cx, repeat negative so far P:   Continue Zosyn until 3/29, then ancef to complete 14 ds if resp improvement Vancomycin stopped on 3/23    ENDOCRINE A:   Hyperglycemia. P:   SSI  NEUROLOGIC A:   Acute metabolic encephalopathy: multifactorial - withdrawal, uremia, hepatic, sedation. At risk ETOH/benzo withdrawal. P:   RASS goal -1 Failed precedex  ct propofol (TGs rising , recheck 3/29 ), and minimise fentanyl drips Ct  clonazepam 1 bid Add seroqeul 100 bid  FAMILY  - Updates:   updated daughter  3/27 - agreeable to trach, outlined guarded prognosis for ARDS  - Inter-disciplinary family meet or Palliative Care meeting due by: 3/25  Independent CC time 17 minutes  Kara Mead MD. [facility] [place] [facility] Pager [phone] If no response call [phone]    04/01/2016, 12:09 PM

## 2016-04-01 NOTE — Progress Notes (Signed)
ABG results given to Dr Elsworth Soho.  Per MD, no changes currently.

## 2016-04-01 NOTE — Progress Notes (Signed)
CSW engaged with Patient's daughter in 62M conference room. CSW introduced self, role of CSW, and addressed Patient's daughter's questions regarding discharge planning. Full CSW Assessment to follow.    Lorrine Kin, MSW, LCSW [facility] ED/61M Clinical Social Worker [phone]

## 2016-04-01 NOTE — Progress Notes (Signed)
RN called d/t pt desat to 80%, pt vent asynchrony also.  Fio2 increased to 100%, RN gave sedation.

## 2016-04-02 ENCOUNTER — Inpatient Hospital Stay (HOSPITAL_COMMUNITY): Payer: Medicare Other

## 2016-04-02 LAB — GLUCOSE, CAPILLARY
GLUCOSE-CAPILLARY: 134 mg/dL — AB (ref 65–99)
GLUCOSE-CAPILLARY: 160 mg/dL — AB (ref 65–99)
GLUCOSE-CAPILLARY: 111 mg/dL — AB (ref 65–99)
GLUCOSE-CAPILLARY: 134 mg/dL — AB (ref 65–99)
Glucose-Capillary: 123 mg/dL — ABNORMAL HIGH (ref 65–99)
Glucose-Capillary: 132 mg/dL — ABNORMAL HIGH (ref 65–99)
Glucose-Capillary: 144 mg/dL — ABNORMAL HIGH (ref 65–99)
Glucose-Capillary: 147 mg/dL — ABNORMAL HIGH (ref 65–99)

## 2016-04-02 LAB — BLOOD GAS, ARTERIAL
Acid-Base Excess: 5.9 mmol/L — ABNORMAL HIGH (ref 0.0–2.0)
Bicarbonate: 33.2 mmol/L — ABNORMAL HIGH (ref 20.0–28.0)
Drawn by: 345601
FIO2: 100
LHR: 28 {breaths}/min
O2 Saturation: 99.5 %
PEEP: 14 cmH2O
Patient temperature: 99.3
Pressure control: 15 cmH2O
pCO2 arterial: 84.6 mmHg (ref 32.0–48.0)
pH, Arterial: 7.221 — ABNORMAL LOW (ref 7.350–7.450)
pO2, Arterial: 271 mmHg — ABNORMAL HIGH (ref 83.0–108.0)

## 2016-04-02 LAB — BASIC METABOLIC PANEL
ANION GAP: 8 (ref 5–15)
BUN: 47 mg/dL — ABNORMAL HIGH (ref 6–20)
CALCIUM: 8.5 mg/dL — AB (ref 8.9–10.3)
CO2: 33 mmol/L — ABNORMAL HIGH (ref 22–32)
CREATININE: 2.05 mg/dL — AB (ref 0.61–1.24)
Chloride: 106 mmol/L (ref 101–111)
GFR calc Af Amer: 41 mL/min — ABNORMAL LOW (ref >60)
GFR, EST NON AFRICAN AMERICAN: 35 mL/min — AB (ref >60)
GLUCOSE: 151 mg/dL — AB (ref 65–99)
Potassium: 3.7 mmol/L (ref 3.5–5.1)
Sodium: 147 mmol/L — ABNORMAL HIGH (ref 135–145)

## 2016-04-02 LAB — CBC
HEMATOCRIT: 24.8 % — AB (ref 39.0–52.0)
Hemoglobin: 7.6 g/dL — ABNORMAL LOW (ref 13.0–17.0)
MCH: 30.4 pg (ref 26.0–34.0)
MCHC: 30.6 g/dL (ref 30.0–36.0)
MCV: 99.2 fL (ref 78.0–100.0)
PLATELETS: 288 10*3/uL (ref 150–400)
RBC: 2.5 MIL/uL — ABNORMAL LOW (ref 4.22–5.81)
RDW: 16.7 % — ABNORMAL HIGH (ref 11.5–15.5)
WBC: 12.7 10*3/uL — AB (ref 4.0–10.5)

## 2016-04-02 LAB — TRIGLYCERIDES: Triglycerides: 628 mg/dL — ABNORMAL HIGH (ref <150)

## 2016-04-02 LAB — PHOSPHORUS: Phosphorus: 6.4 mg/dL — ABNORMAL HIGH (ref 2.5–4.6)

## 2016-04-02 LAB — PROTIME-INR
INR: 1.08
Prothrombin Time: 14.1 seconds (ref 11.4–15.2)

## 2016-04-02 LAB — MAGNESIUM: Magnesium: 2.1 mg/dL (ref 1.7–2.4)

## 2016-04-02 LAB — APTT: aPTT: 27 seconds (ref 24–36)

## 2016-04-02 MED ORDER — MIDAZOLAM HCL 2 MG/2ML IJ SOLN: 2.0000 mg | INTRAMUSCULAR | Status: DC | PRN

## 2016-04-02 MED ORDER — PROPOFOL 1000 MG/100ML IV EMUL
INTRAVENOUS | Status: AC
  Administered 2016-04-02: 15:00:00
  Filled 2016-04-02: qty 100

## 2016-04-02 MED ORDER — SODIUM CHLORIDE 0.9 % IV SOLN
0.0000 mg/h | INTRAVENOUS | Status: DC
  Administered 2016-04-02: 2 mg/h via INTRAVENOUS
  Administered 2016-04-02: 5 mg/h via INTRAVENOUS
  Administered 2016-04-03: 7 mg/h via INTRAVENOUS
  Administered 2016-04-03: 3 mg/h via INTRAVENOUS
  Filled 2016-04-02 (×4): qty 10

## 2016-04-02 MED ORDER — CEFAZOLIN SODIUM-DEXTROSE 2-4 GM/100ML-% IV SOLN
2.0000 g | Freq: Three times a day (TID) | INTRAVENOUS | Status: DC
  Administered 2016-04-03 – 2016-04-04 (×4): 2 g via INTRAVENOUS
  Filled 2016-04-02 (×5): qty 100

## 2016-04-02 MED ORDER — FENTANYL CITRATE (PF) 100 MCG/2ML IJ SOLN
INTRAMUSCULAR | Status: AC
  Administered 2016-04-02: 100 ug
  Filled 2016-04-02: qty 2

## 2016-04-02 MED ORDER — STERILE WATER FOR INJECTION IJ SOLN
INTRAMUSCULAR | Status: AC
  Filled 2016-04-02: qty 10

## 2016-04-02 MED ORDER — VECURONIUM BROMIDE 10 MG IV SOLR
INTRAVENOUS | Status: AC
  Administered 2016-04-02: 10 mg
  Filled 2016-04-02: qty 10

## 2016-04-02 MED ORDER — VASOPRESSIN 20 UNIT/ML IV SOLN
0.0300 [IU]/min | INTRAVENOUS | Status: DC
  Administered 2016-04-02 – 2016-04-03 (×2): 0.03 [IU]/min via INTRAVENOUS
  Filled 2016-04-02 (×3): qty 2

## 2016-04-02 MED ORDER — SODIUM BICARBONATE 8.4 % IV SOLN
200.0000 meq | Freq: Once | INTRAVENOUS | Status: AC
  Administered 2016-04-02: 200 meq via INTRAVENOUS
  Filled 2016-04-02: qty 200

## 2016-04-02 MED ORDER — MIDAZOLAM BOLUS VIA INFUSION
1.0000 mg | INTRAVENOUS | Status: DC | PRN
  Filled 2016-04-02: qty 2

## 2016-04-02 MED ORDER — MIDAZOLAM HCL 2 MG/2ML IJ SOLN
INTRAMUSCULAR | Status: AC
  Administered 2016-04-02: 2 mg
  Filled 2016-04-02: qty 2

## 2016-04-02 MED ORDER — VITAL HIGH PROTEIN PO LIQD
1000.0000 mL | ORAL | Status: DC
  Administered 2016-04-02 – 2016-04-06 (×6): 1000 mL

## 2016-04-02 MED ORDER — LACTULOSE 10 GM/15ML PO SOLN
30.0000 g | Freq: Two times a day (BID) | ORAL | Status: AC
  Administered 2016-04-02 (×2): 30 g via ORAL
  Filled 2016-04-02 (×2): qty 45

## 2016-04-02 MED ORDER — STERILE WATER FOR INJECTION IV SOLN
INTRAVENOUS | Status: DC
  Administered 2016-04-02 – 2016-04-03 (×2): via INTRAVENOUS
  Filled 2016-04-02 (×8): qty 850

## 2016-04-02 MED ORDER — QUETIAPINE FUMARATE 50 MG PO TABS
50.0000 mg | ORAL_TABLET | Freq: Two times a day (BID) | ORAL | Status: DC
  Administered 2016-04-02 – 2016-04-03 (×3): 50 mg
  Filled 2016-04-02 (×4): qty 1

## 2016-04-02 MED ORDER — BISACODYL 10 MG RE SUPP
10.0000 mg | Freq: Once | RECTAL | Status: AC
  Administered 2016-04-02: 10 mg via RECTAL
  Filled 2016-04-02: qty 1

## 2016-04-02 MED ORDER — ETOMIDATE 2 MG/ML IV SOLN
INTRAVENOUS | Status: AC
  Administered 2016-04-02: 20 mg
  Filled 2016-04-02: qty 20

## 2016-04-02 MED ORDER — SODIUM BICARBONATE 8.4 % IV SOLN
INTRAVENOUS | Status: AC
  Administered 2016-04-02: 50 meq via INTRAVENOUS
  Filled 2016-04-02: qty 50

## 2016-04-02 MED ORDER — SODIUM CHLORIDE 0.9 % IV BOLUS (SEPSIS)
1000.0000 mL | Freq: Once | INTRAVENOUS | Status: AC
  Administered 2016-04-02: 1000 mL via INTRAVENOUS

## 2016-04-02 MED ORDER — SODIUM BICARBONATE 8.4 % IV SOLN
INTRAVENOUS | Status: AC
  Filled 2016-04-02: qty 200

## 2016-04-02 MED ORDER — CLONAZEPAM 1 MG PO TABS
2.0000 mg | ORAL_TABLET | Freq: Two times a day (BID) | ORAL | Status: DC
  Administered 2016-04-02 – 2016-04-03 (×3): 2 mg
  Filled 2016-04-02 (×3): qty 2

## 2016-04-02 MED ORDER — SODIUM BICARBONATE 8.4 % IV SOLN
50.0000 meq | Freq: Once | INTRAVENOUS | Status: AC
  Administered 2016-04-02: 50 meq via INTRAVENOUS
  Filled 2016-04-02: qty 50

## 2016-04-02 NOTE — Progress Notes (Signed)
eLink Physician-Brief Progress Note Patient Name: Jesse Hartman DOB: 12/23/1960 MRN: [id number]   Date of Service  04/02/2016  HPI/Events of Note  ABG on 100%/PC 15/Rate 15/P 14 = 6.949/too high to measure/87.7.  eICU Interventions  Will order: 1. Increase ventilator rate to 21.  2. NaHCO3 200 meq IV now. 3. NaHCO3 IV infusion.  4. Repeat ABG at 9 PM.     Intervention Category Major Interventions: Acid-Base disturbance - evaluation and management;Respiratory failure - evaluation and management  Sommer,Steven Cornelia Copa 04/02/2016, 7:25 PM

## 2016-04-02 NOTE — Progress Notes (Signed)
PULMONARY / CRITICAL CARE MEDICINE   Name: Jesse Hartman MRN: [id number] DOB: Jun 21, 1960    ADMISSION DATE:  03/10/2016 CONSULTATION DATE:  03/31/2016  REFERRING MD:  Dr. Vanita Panda  CHIEF COMPLAINT:  AMS  HISTORY OF PRESENT ILLNESS:   56 year old male with PMH of ETOH abuse (24 pack per day), COPD, Barrett's esophagus, HTN, Seizures, and OSA. He was started on lasix and potassium supplementation approximately 3/12. He was found down by his daughter 3/19 and brought to ED via EMS,hypotensive, with AKI & hyperkalemia , cr 4.2  While in CT he had episode of projectile vomiting of coffee ground emesis. Despite IVF resuscitation he remained hypotensive . 3/19 PM cardiac arrest 6 mins, intubated. On pressors.    STUDIES:  CT head 3/19 > No evidence of acute intracranial abnormality. Generalized cerebral volume loss. Mild paranasal sinusitis of uncertain acuity . CT abd/pelv 3/19 > Stranding around the pancreas compatible with acute pancreatitis. Fatty infiltration of the liver. Distended, fluid-filled stomach. EGD 3/19 > Severe esophagitis with no acute hemorrhage  CULTURES: Blood 3/19 >> 1 /2  S viridans Urine 3/19 > insignificant growth Resp 3/21 >> abundant yeast, PMNs >> reincubated >>  Few yeast Blood 3/21 >> ng Urine 3/21 >> negative  ANTIBIOTICS: vanco 3/19 >> 3/20 vanco 3/22 >> 3/23 Zosyn 3/19 >  SIGNIFICANT EVENTS: 3/19 admit, ARF 3/19 PM cardiac arrest 6 mins, intubated. On pressors.  3/23 Increased infiltrates over the last 24 hours on chest x-ray ? aspiration Changed to propofol on 3/24 3/25  significant cough that causes auto PEEP and subsequently hypotension  LINES/TUBES: CVL 3/19 > ETT 3/19 > L fem art line 3/21 >3/28  SUBJECTIVE/Interval:  Remains Critically ill, intubated & sedated Continues to have asynchrony  on vent On high dose levo gtt Good UO    VITAL SIGNS: BP (!) 75/46   Pulse (!) 109   Temp 99 F (37.2 C) (Core (Comment))   Resp 18   Ht 6'  (1.829 m)   Wt 292 lb 5.3 oz (132.6 kg)   SpO2 92%   BMI 39.65 kg/m   HEMODYNAMICS: CVP:  [12 mmHg-16 mmHg] 12 mmHg  VENTILATOR SETTINGS: Vent Mode: PCV FiO2 (%):  [60 %-100 %] 70 % Set Rate:  [12 bmp] 12 bmp PEEP:  [10 cmH20] 10 cmH20 Plateau Pressure:  [21 cmH20-24 cmH20] 21 cmH20  INTAKE / OUTPUT: I/O last 3 completed shifts: In: 4180.7 [I.V.:2997.2; NG/GT:1046; IV Piggyback:137.5] Out: 3500 [Urine:3500]  PHYSICAL EXAMINATION:  General:  Ill-appearing man on mechanical ventilation,sedated Neuro:  Sedated on propofol gtt, RASS-2 HEENT:  ETT in place, no oral lesions Cardiovascular:  Distant, regular, no murmurs Lungs: Diffuse bilateral crackles and rhonchi, more on the right than on the left, no wheeze, asynchrony with 'double clutching' intermittent Abdomen:  Soft, benign, hypoactive bowel sounds Musculoskeletal: no deformity Skin: no rash  LABS:  BMET  Recent Labs Lab 03/31/16 0449 04/01/16 0355 04/02/16 0445  NA 151* 149* 147*  K 3.5 3.9 3.7  CL 107 108 106  CO2 32 31 33*  BUN 41* 45* 47*  CREATININE 1.62* 1.78* 2.05*  GLUCOSE 151* 111* 151*    Electrolytes  Recent Labs Lab 03/28/16 0400  03/30/16 0350 03/31/16 0449 04/01/16 0355 04/02/16 0445  CALCIUM 8.2*  < > 8.5* 8.6* 8.5* 8.5*  MG 2.1  --  2.0  --   --  2.1  PHOS 3.5  --   --   --   --  6.4*  < > =  values in this interval not displayed.  CBC  Recent Labs Lab 03/31/16 0449 04/01/16 0355 04/02/16 0445  WBC 12.3* 12.2* 12.7*  HGB 8.4* 8.2* 7.6*  HCT 27.0* 25.9* 24.8*  PLT 268 348 288    Coag's  Recent Labs Lab 03/28/16 0400  INR 1.19    Sepsis Markers No results for input(s): LATICACIDVEN, PROCALCITON, O2SATVEN in the last 168 hours.  ABG  Recent Labs Lab 03/30/16 1215 03/31/16 0405 04/01/16 1212  PHART 7.411 7.404 7.309*  PCO2ART 62.0* 52.8* 64.9*  PO2ART 56.0* 52.0* 62.0*    Liver Enzymes  Recent Labs Lab 03/27/16 0315 03/31/16 0449  AST 183* 100*  ALT  287* 93*  ALKPHOS 57 295*  BILITOT 1.3* 2.8*  ALBUMIN 1.5* 1.5*    Cardiac Enzymes No results for input(s): TROPONINI, PROBNP in the last 168 hours.  Glucose  Recent Labs Lab 04/01/16 1220 04/01/16 1548 04/01/16 2007 04/01/16 2330 04/02/16 0316 04/02/16 0831  GLUCAP 134* 143* 132* 144* 134* 160*    Imaging Dg Chest Port 1 View  Result Date: 04/02/2016 CLINICAL DATA:  Respiratory failure. EXAM: PORTABLE CHEST 1 VIEW COMPARISON:  04/01/2016. FINDINGS: Endotracheal tube and feeding tube in stable position. Diffuse bilateral severe airspace disease is again noted. Heart size stable. No pleural effusion or pneumothorax . IMPRESSION: 1. Lines and tubes in stable position. 2. Diffuse severe bilateral airspace disease again noted. No interim change. Electronically Signed   By: Marcello Moores  Register   On: 04/02/2016 07:31      DISCUSSION: 56 year old male admitted with AMS and AKI. Was improving but suffered massive coffee ground emesis and subsequent brief cardiac arrest. Developed ARDS & shock. Mental status intact but hypoxia very slow to improve  ASSESSMENT / PLAN:  PULMONARY A: Acute hypoxemic respiratory failure: suspect he aspirated on emesis, CXR and exam also concerning ALI vs edema. COPD without acute exacerbation. OSA. BL PNA -favor Aspiration P:   Vent settings reviewed & adjusted - keep trigger to -5, ct PCV 15 ,  PEEP 10, increase RR 15 Scheduled bronchodilators Plan for Tstomy   CARDIOVASCULAR A:  Shock: hemorrhagic, +/- septic  H/o HTN, CHF. P:  Wean norepinephrine as able - likely related to sedation requirements (propofol) & vent asynchrony Add vaso Goal CVP 10-12 range  RENAL A:   Acute renal failure - presumed ATN in setting dehydration/ARB/NSAIDs/shock. Improving Hyperkalemia - resolved. AG acidosis secondary to lactic/renal failure - resolved. Hyponatremia dehydration / beer potomania - resolved. Hypernatremia P:   Follow BMP, urine  output Continue free water Nephrology signed off.   GASTROINTESTINAL A:   GIB - severe esophagitis on EGD. No acute bleed so no interventions performed. Acute pancreatitis - per CT but lipase / amylase normal, suspect more chronic . H/o Barrett's esophagus. Constipation P:   Increase tube feeding @ 40/h Hold off on GI consultation as his hemoglobin has stabilized Protonix twice a day. No BM since admit - repeatDulcolax suppository &  lactulose  HEMATOLOGIC A:   ABLA secondary to GIB, some continued blood loss, no hematemesis, no melena yet P:  Follow CBC Transfusion goal Hgb > 7  INFECTIOUS A:   Aspiration PNA, LLL Yeast resp cx, unclear significance  S. Viridans 1 of 2 blood cx, repeat negative so far P:   Continue Zosyn until 3/29, then ancef to complete 14 ds if resp improvement Vancomycin stopped on 3/23  ENDOCRINE A:   Hyperglycemia. P:   SSI  NEUROLOGIC A:   Acute metabolic encephalopathy: multifactorial -  withdrawal, uremia, hepatic, sedation. At risk ETOH/benzo withdrawal. P:   RASS goal -1 Failed precedex  dc propofol (TGs rising ), Addversed gtt and minimise fentanyl drips Increase clonazepam 2 bid Decrease seroqeul 50 bid - qtc 500 range  FAMILY  - Updates:  updated daughter  3/29 - agreeable to trach, outlined guarded prognosis for full recovery from ARDS  - Inter-disciplinary family meet or Palliative Care meeting due by: 3/25  Independent CC time 38 minutes  Kara Mead MD. [facility]. [place] [facility] Pager [phone] If no response call [phone]    04/02/2016, 12:15 PM

## 2016-04-02 NOTE — Progress Notes (Signed)
eLink Physician-Brief Progress Note Patient Name: Jesse Hartman DOB: 03-10-1960 MRN: [id number]   Date of Service  04/02/2016  HPI/Events of Note  Hypotension - BP = 87/42, CVP = 10 and Hgb = 7.6. Currently on a Norepinephrine IV infusion at 34 mcg/min.  eICU Interventions  Will order: 1. Bolus with 0.9 NaCl 1 liter IV over 1 hour now.  2. ABG STAT.     Intervention Category Major Interventions: Hypotension - evaluation and management  Lysle Dingwall 04/02/2016, 6:43 PM

## 2016-04-02 NOTE — Progress Notes (Signed)
Couldn't do the tracheostomy today because of O2 sat in 80's, gave all meds then decided to abort the attempt. Placed pt on 100% FIO2.   Gershon Crane

## 2016-04-02 NOTE — Progress Notes (Signed)
This note also relates to the following rows which could not be included: Pulse Rate - Cannot attach notes to unvalidated device data SpO2 - Cannot attach notes to unvalidated device data  RT attempted to wean FIO2 to 60%, sat dropped to 85%.  Pt placed back on 70%, sat improved to 94%.

## 2016-04-02 NOTE — Progress Notes (Addendum)
eLink Physician-Brief Progress Note Patient Name: Jesse Hartman DOB: [birth year] MRN: [id number]   Date of Service  04/02/2016  HPI/Events of Note  ABG on 100%/PC 15/Rate = 28/P 14 = 7.22/84.6/271/33.  eICU Interventions  Will order: 1. NaHCO3 50 meq IV now.  2. Continue NaHCO3 IV infusion at 125 mL/hour. 3. ABG at 1 AM.     Intervention Category Major Interventions: Acid-Base disturbance - evaluation and management;Respiratory failure - evaluation and management  Lysle Dingwall 04/02/2016, 10:39 PM

## 2016-04-03 ENCOUNTER — Inpatient Hospital Stay (HOSPITAL_COMMUNITY): Payer: Medicare Other

## 2016-04-03 LAB — BLOOD GAS, ARTERIAL
ACID-BASE EXCESS: 1.3 mmol/L (ref 0.0–2.0)
ACID-BASE EXCESS: 10.5 mmol/L — AB (ref 0.0–2.0)
Acid-Base Excess: 7.5 mmol/L — ABNORMAL HIGH (ref 0.0–2.0)
Acid-Base Excess: 9.5 mmol/L — ABNORMAL HIGH (ref 0.0–2.0)
Acid-Base Excess: 8.5 mmol/L — ABNORMAL HIGH (ref 0.0–2.0)
BICARBONATE: 37.7 mmol/L — AB (ref 20.0–28.0)
Bicarbonate: 33.3 mmol/L — ABNORMAL HIGH (ref 20.0–28.0)
Bicarbonate: 34.6 mmol/L — ABNORMAL HIGH (ref 20.0–28.0)
Bicarbonate: 37.9 mmol/L — ABNORMAL HIGH (ref 20.0–28.0)
Bicarbonate: 39.8 mmol/L — ABNORMAL HIGH (ref 20.0–28.0)
DRAWN BY: 441371
DRAWN BY: 441371
DRAWN BY: 345601
Drawn by: 345601
Drawn by: 441371
FIO2: 100
FIO2: 80
FIO2: 70
FIO2: 70
FIO2: 100
LHR: 28 {breaths}/min
LHR: 35 {breaths}/min
MECHVT: 500 mL
O2 SAT: 91.2 %
O2 SAT: 92.6 %
O2 SAT: 95.4 %
O2 Saturation: 99 %
O2 Saturation: 94.8 %
PATIENT TEMPERATURE: 98.3
PATIENT TEMPERATURE: 98.6
PATIENT TEMPERATURE: 98.8
PCO2 ART: 83.4 mmHg — AB (ref 32.0–48.0)
PEEP/CPAP: 10 cmH2O
PEEP/CPAP: 10 cmH2O
PEEP: 14 cmH2O
PEEP: 14 cmH2O
PEEP: 14 cmH2O
PH ART: 6.949 — AB (ref 7.350–7.450)
PH ART: 7.117 — AB (ref 7.350–7.450)
PH ART: 7.118 — AB (ref 7.350–7.450)
PO2 ART: 87.7 mmHg (ref 83.0–108.0)
PO2 ART: 88 mmHg (ref 83.0–108.0)
PRESSURE CONTROL: 15 cmH2O
Patient temperature: 98.6
Patient temperature: 98.6
Pressure control: 15 cmH2O
Pressure control: 15 cmH2O
Pressure control: 15 cmH2O
RATE: 15 resp/min
RATE: 28 resp/min
RATE: 35 resp/min
pCO2 arterial: 108 mmHg (ref 32.0–48.0)
pH, Arterial: 7.242 — ABNORMAL LOW (ref 7.350–7.450)
pH, Arterial: 7.17 — CL (ref 7.350–7.450)
pO2, Arterial: 160 mmHg — ABNORMAL HIGH (ref 83.0–108.0)
pO2, Arterial: 88.8 mmHg (ref 83.0–108.0)
pO2, Arterial: 81.2 mmHg — ABNORMAL LOW (ref 83.0–108.0)

## 2016-04-03 LAB — CBC
HEMATOCRIT: 22.6 % — AB (ref 39.0–52.0)
HEMATOCRIT: 23.6 % — AB (ref 39.0–52.0)
HEMOGLOBIN: 7.2 g/dL — AB (ref 13.0–17.0)
Hemoglobin: 6.7 g/dL — CL (ref 13.0–17.0)
MCH: 29.9 pg (ref 26.0–34.0)
MCH: 29.6 pg (ref 26.0–34.0)
MCHC: 29.6 g/dL — AB (ref 30.0–36.0)
MCHC: 30.5 g/dL (ref 30.0–36.0)
MCV: 100.9 fL — AB (ref 78.0–100.0)
MCV: 97.1 fL (ref 78.0–100.0)
PLATELETS: 237 10*3/uL (ref 150–400)
Platelets: 224 10*3/uL (ref 150–400)
RBC: 2.24 MIL/uL — ABNORMAL LOW (ref 4.22–5.81)
RBC: 2.43 MIL/uL — ABNORMAL LOW (ref 4.22–5.81)
RDW: 16.6 % — AB (ref 11.5–15.5)
RDW: 19.1 % — ABNORMAL HIGH (ref 11.5–15.5)
WBC: 8.9 10*3/uL (ref 4.0–10.5)
WBC: 8.8 10*3/uL (ref 4.0–10.5)

## 2016-04-03 LAB — BASIC METABOLIC PANEL
ANION GAP: 10 (ref 5–15)
BUN: 57 mg/dL — AB (ref 6–20)
CALCIUM: 7.7 mg/dL — AB (ref 8.9–10.3)
CO2: 38 mmol/L — AB (ref 22–32)
Chloride: 100 mmol/L — ABNORMAL LOW (ref 101–111)
Creatinine, Ser: 3.36 mg/dL — ABNORMAL HIGH (ref 0.61–1.24)
GFR calc Af Amer: 22 mL/min — ABNORMAL LOW (ref >60)
GFR, EST NON AFRICAN AMERICAN: 19 mL/min — AB (ref >60)
GLUCOSE: 124 mg/dL — AB (ref 65–99)
Potassium: 3.8 mmol/L (ref 3.5–5.1)
Sodium: 148 mmol/L — ABNORMAL HIGH (ref 135–145)

## 2016-04-03 LAB — GLUCOSE, CAPILLARY
GLUCOSE-CAPILLARY: 147 mg/dL — AB (ref 65–99)
Glucose-Capillary: 104 mg/dL — ABNORMAL HIGH (ref 65–99)
Glucose-Capillary: 129 mg/dL — ABNORMAL HIGH (ref 65–99)
Glucose-Capillary: 136 mg/dL — ABNORMAL HIGH (ref 65–99)
Glucose-Capillary: 136 mg/dL — ABNORMAL HIGH (ref 65–99)
Glucose-Capillary: 155 mg/dL — ABNORMAL HIGH (ref 65–99)

## 2016-04-03 LAB — PREPARE RBC (CROSSMATCH)

## 2016-04-03 LAB — MAGNESIUM: Magnesium: 2.2 mg/dL (ref 1.7–2.4)

## 2016-04-03 LAB — POCT ACTIVATED CLOTTING TIME
ACTIVATED CLOTTING TIME: 147 s
Activated Clotting Time: 153 seconds

## 2016-04-03 LAB — PHOSPHORUS: PHOSPHORUS: 8.5 mg/dL — AB (ref 2.5–4.6)

## 2016-04-03 MED ORDER — HEPARIN SODIUM (PORCINE) 1000 UNIT/ML DIALYSIS
1000.0000 [IU] | INTRAMUSCULAR | Status: DC | PRN
  Filled 2016-04-03: qty 6

## 2016-04-03 MED ORDER — NOREPINEPHRINE 16 MG/250ML-% IV SOLN
0.0000 ug/min | INTRAVENOUS | Status: DC
  Administered 2016-04-03: 25 ug/min via INTRAVENOUS
  Filled 2016-04-03: qty 250

## 2016-04-03 MED ORDER — SODIUM BICARBONATE 8.4 % IV SOLN
100.0000 meq | Freq: Once | INTRAVENOUS | Status: AC
  Administered 2016-04-03: 100 meq via INTRAVENOUS

## 2016-04-03 MED ORDER — HEPARIN BOLUS VIA INFUSION (CRRT)
1000.0000 [IU] | INTRAVENOUS | Status: DC | PRN
  Administered 2016-04-03 – 2016-04-06 (×4): 1000 [IU] via INTRAVENOUS_CENTRAL
  Filled 2016-04-03 (×2): qty 1000

## 2016-04-03 MED ORDER — HEPARIN SOD (PORK) LOCK FLUSH 100 UNIT/ML IV SOLN
500.0000 [IU] | Freq: Once | INTRAVENOUS | Status: AC
Start: 2016-04-03 — End: 2016-04-03
  Administered 2016-04-03: 500 [IU] via INTRAVENOUS
  Filled 2016-04-03: qty 5

## 2016-04-03 MED ORDER — VECURONIUM BROMIDE 10 MG IV SOLR
10.0000 mg | Freq: Once | INTRAVENOUS | Status: AC
  Administered 2016-04-03: 10 mg via INTRAVENOUS
  Filled 2016-04-03: qty 10

## 2016-04-03 MED ORDER — SODIUM CHLORIDE 0.9 % IV SOLN
Freq: Once | INTRAVENOUS | Status: AC
  Administered 2016-04-03: 07:00:00 via INTRAVENOUS

## 2016-04-03 MED ORDER — PRISMASOL BGK 4/2.5 32-4-2.5 MEQ/L IV SOLN
INTRAVENOUS | Status: DC
  Administered 2016-04-03 – 2016-04-09 (×47): via INTRAVENOUS_CENTRAL
  Filled 2016-04-03 (×60): qty 5000

## 2016-04-03 MED ORDER — SODIUM CHLORIDE 0.9 % IJ SOLN
250.0000 [IU]/h | INTRAMUSCULAR | Status: DC
  Administered 2016-04-03: 250 [IU]/h via INTRAVENOUS_CENTRAL
  Administered 2016-04-04: 2450 [IU]/h via INTRAVENOUS_CENTRAL
  Administered 2016-04-04: 1250 [IU]/h via INTRAVENOUS_CENTRAL
  Administered 2016-04-04: 1750 [IU]/h via INTRAVENOUS_CENTRAL
  Administered 2016-04-04 – 2016-04-05 (×8): 2500 [IU]/h via INTRAVENOUS_CENTRAL
  Administered 2016-04-06: 3000 [IU]/h via INTRAVENOUS_CENTRAL
  Administered 2016-04-06 (×2): 2500 [IU]/h via INTRAVENOUS_CENTRAL
  Administered 2016-04-06 (×2): 3000 [IU]/h via INTRAVENOUS_CENTRAL
  Administered 2016-04-07 (×3): 2500 [IU]/h via INTRAVENOUS_CENTRAL
  Filled 2016-04-03 (×23): qty 2

## 2016-04-03 MED ORDER — PRISMASOL BGK 4/2.5 32-4-2.5 MEQ/L IV SOLN
INTRAVENOUS | Status: DC
  Administered 2016-04-03 – 2016-04-07 (×5): via INTRAVENOUS_CENTRAL
  Filled 2016-04-03 (×9): qty 5000

## 2016-04-03 MED ORDER — SODIUM CHLORIDE 0.9 % FOR CRRT
INTRAVENOUS_CENTRAL | Status: DC | PRN
  Administered 2016-04-06: 11:00:00 via INTRAVENOUS_CENTRAL
  Filled 2016-04-03 (×2): qty 1000

## 2016-04-03 MED ORDER — SODIUM BICARBONATE 8.4 % IV SOLN
INTRAVENOUS | Status: AC
  Filled 2016-04-03: qty 100

## 2016-04-03 MED ORDER — STERILE WATER FOR INJECTION IV SOLN
INTRAVENOUS | Status: DC
  Administered 2016-04-03 – 2016-04-05 (×5): via INTRAVENOUS_CENTRAL
  Filled 2016-04-03 (×8): qty 150

## 2016-04-03 MED ORDER — PHENYLEPHRINE HCL 10 MG/ML IJ SOLN
0.0000 ug/min | INTRAMUSCULAR | Status: DC
  Administered 2016-04-03: 100 ug/min via INTRAVENOUS
  Administered 2016-04-04: 106.667 ug/min via INTRAVENOUS
  Filled 2016-04-03 (×3): qty 4

## 2016-04-03 NOTE — Progress Notes (Signed)
   Update  - after chart review -> patient likely never on CRRT  Currently severe metab and resp acidosis - on bic gtt 125cc/hj and RR on vent 28/min  Recent Labs Lab 03/30/16 1215 03/31/16 0405 04/01/16 1212 04/02/16 1855 04/02/16 2201 04/03/16 0110 04/03/16 1256  PHART 7.411 7.404 7.309* 6.949* 7.221* 7.242* 7.170*  PCO2ART 62.0* 52.8* 64.9* ABOVE REPORTABLE RANGE 84.6* 83.4* 108*  PO2ART 56.0* 52.0* 62.0* 87.7 271* 160* 88.8  HCO3 39.4* 33.0* 32.8* 33.3* 33.2* 34.6* 37.9*  TCO2 41 35 35  --   --   --   --   O2SAT 88.0 86.0 89.0 91.2 99.5 99.0 94.8    Recent Labs Lab 03/30/16 0350 03/31/16 0449 04/01/16 0355 04/02/16 0445 04/03/16 0434  CREATININE 1.76* 1.62* 1.78* 2.05* 3.36*   Almost anuric  Plan  - increase RR to 35/min - place aline - renal consult   Dr. Brand Males, M.D., F.C.C.P Pulmonary and Critical Care Medicine Staff Physician [place] [facility] Pager: [phone], If no answer or between  15:00h - 7:00h: call [phone]  04/03/2016 1:28 PM

## 2016-04-03 NOTE — Procedures (Signed)
Central Venous Catheter Insertion Procedure Note Jesse Hartman [id number] 1960-09-12  Procedure: Insertion of Central Venous Catheter Indications: cvvhd cath, aciodosis, septic shock  Procedure Details Consent: Risks of procedure as well as the alternatives and risks of each were explained to the (patient/caregiver).  Consent for procedure obtained. Time Out: Verified patient identification, verified procedure, site/side was marked, verified correct patient position, special equipment/implants available, medications/allergies/relevent history reviewed, required imaging and test results available.  Performed  Maximum sterile technique was used including antiseptics, cap, gloves, gown, hand hygiene, mask and sheet. Skin prep: Chlorhexidine; local anesthetic administered A antimicrobial bonded/coated triple lumen catheter was placed in the right femoral vein due to patient being a dialysis patient using the Seldinger technique.  HD CATH PLACED  Evaluation Blood flow good Complications: No apparent complications Patient did tolerate procedure well. Chest X-ray ordered to verify placement.  CXR: not indicated.  Jesse Hartman 04/03/2016, 5:22 PM

## 2016-04-03 NOTE — Progress Notes (Signed)
  Interdisciplinary Goals of Care Family Meeting   Date carried out:: 04/03/2016  Location of the meeting: Phone conference  Member's involved: Physician, Bedside Registered Nurse and Other: daugher [place] or acting medical decision maker: daughter    Discussion: We discussed goals of care for Jesse Hartman .  - daughter in tears. She describes patient as disabled from etoh issues and currently living in an apartment and daily etoh. Appraised of current status and she was in tear and crying inconsoloably. Recommended full medical care but DNR - but she wants full code  Code status: Full Code  Disposition: Continue current acute care  Time spent for the meeting: 15 mini (call took place few hours ago)  Jesse Hartman 04/03/2016, 5:25 PM

## 2016-04-03 NOTE — Progress Notes (Signed)
   04/03/16 2245  Clinical Encounter Type  Visited With Patient  Visit Type Code  Spiritual Encounters  Spiritual Needs Emotional  Stress Factors  Patient Stress Factors Health changes  Responded to Code Blue. Pt stabilized. If family comes desk will page.

## 2016-04-03 NOTE — Progress Notes (Signed)
Dr. Lynford Citizen Advised of panic lab values. Ph-[phone] PCO2- 108  Orders placed by MD

## 2016-04-03 NOTE — Progress Notes (Signed)
I was asked by Dr. Chase Caller to reevaluate Jesse Hartman a 56 y.o. male with ETOHism, COPD, OSA and Barret's esophagus, HTN- On 01/14/16  creatinine was 0.88.  Pt brought into ED on 3/19 by family for confusion/confabulation and being found at home covered in either stool or vomit.  Upon arrival to the ER- he was hypotensive- lactate of 11- K of 6.2 and sodium of 121.  Creat was 4.2.I improving with supportive care.  On 3/27 creat was 1.62.  Over past couple of days pt has had worsening agitation, hypoxemia, hypotension and acidosis.  We are asked to assist with dialytic intervention.  Objective: Vital signs in last 24 hours: Temp:  [98.4 F (36.9 C)-99.1 F (37.3 C)] 98.6 F (37 C) (03/30 0845) Pulse Rate:  [92-110] 108 (03/30 1745) Resp:  [10-39] 17 (03/30 1745) BP: (82-120)/(42-70) 113/64 (03/30 1745) SpO2:  [91 %-100 %] 95 % (03/30 1745) FiO2 (%):  [70 %-100 %] 70 % (03/30 1625) Weight:  [136.9 kg (301 lb 13 oz)] 136.9 kg (301 lb 13 oz) (03/30 0401) Weight change: 4.3 kg (9 lb 7.7 oz)  Intake/Output from previous day: 03/29 0701 - 03/30 0700 In: 5430 [I.V.:3160; Blood:30; NG/GT:2040; IV Piggyback:200] Out: 625 [Urine:625] Intake/Output this shift: Total I/O In: 3795.9 [I.V.:2120.9; Blood:375; NG/GT:1200; IV Piggyback:100] Out: 55 [Urine:55]  General appearance: sedaated Resp: clear to auscultation bilaterally Cardio: regular rate and rhythm, S1, S2 normal, no murmur, click, rub or gallop Extremities: edema 1-2+ Neurologic: sedated and unresponsive  Lab Results:  Recent Labs  04/03/16 0434 04/03/16 1113  WBC 8.9 8.8  HGB 6.7* 7.2*  HCT 22.6* 23.6*  PLT 237 224   BMET:  Recent Labs  04/02/16 0445 04/03/16 0434  NA 147* 148*  K 3.7 3.8  CL 106 100*  CO2 33* 38*  GLUCOSE 151* 124*  BUN 47* 57*  CREATININE 2.05* 3.36*  CALCIUM 8.5* 7.7*   No results for input(s): PTH in the last 72 hours. Iron Studies: No results for input(s): IRON, TIBC, TRANSFERRIN, FERRITIN in  the last 72 hours. Studies/Results: Dg Chest Port 1 View  Result Date: 04/03/2016 CLINICAL DATA:  Followup pulmonary status. EXAM: PORTABLE CHEST 1 VIEW COMPARISON:  Earlier film, same date. FINDINGS: The endotracheal tube is 6.9 cm above the carina. There is a feeding tube coursing down the esophagus and into the stomach. A left IJ catheter tip is in the region of the left brachiocephalic vein. Persistent diffuse interstitial and airspace process. No pneumothorax or pleural effusion. IMPRESSION: Persistent diffuse interstitial and airspace process but no large pleural effusion or pneumothorax. Stable support apparatus. Electronically Signed   By: Marijo Sanes M.D.   On: 04/03/2016 15:34   Dg Chest Port 1 View  Result Date: 04/03/2016 CLINICAL DATA:  Acute respiratory failure EXAM: PORTABLE CHEST 1 VIEW COMPARISON:  04/02/2016 FINDINGS: Endotracheal tube in good position. Left neck central venous catheter tip overlies the left lung apex unchanged and may be in a left external jugular vein branch. Feeding tube enters the stomach with the tip not visualized Severe diffuse bilateral airspace disease unchanged. No effusion or pneumothorax. IMPRESSION: Severe bilateral airspace disease unchanged. Endotracheal tube remains in good position. Electronically Signed   By: Franchot Gallo M.D.   On: 04/03/2016 07:27   Dg Chest Port 1 View  Result Date: 04/02/2016 CLINICAL DATA:  Respiratory failure. EXAM: PORTABLE CHEST 1 VIEW COMPARISON:  04/01/2016. FINDINGS: Endotracheal tube and feeding tube in stable position. Diffuse bilateral severe airspace disease is again  noted. Heart size stable. No pleural effusion or pneumothorax . IMPRESSION: 1. Lines and tubes in stable position. 2. Diffuse severe bilateral airspace disease again noted. No interim change. Electronically Signed   By: Marcello Moores  Register   On: 04/02/2016 07:31    Scheduled: .  ceFAZolin (ANCEF) IV  2 g Intravenous Q8H  . chlorhexidine gluconate  (MEDLINE KIT)  15 mL Mouth Rinse BID  . Chlorhexidine Gluconate Cloth  6 each Topical Daily  . clonazePAM  2 mg Per Tube BID  . feeding supplement (VITAL HIGH PROTEIN)  1,000 mL Per Tube Q24H  . folic acid  1 mg Per Tube Daily  . heparin lock flush  500 Units Intravenous Once  . insulin aspart  0-15 Units Subcutaneous Q4H  . ipratropium-albuterol  3 mL Nebulization Q6H  . mouth rinse  15 mL Mouth Rinse QID  . pantoprazole sodium  40 mg Per Tube BID  . QUEtiapine  50 mg Per Tube BID  . sodium chloride flush  10-40 mL Intracatheter Q12H  . thiamine  100 mg Per Tube Daily    LOS: 11 days   Jesse Hartman C 04/03/2016,6:08 PM

## 2016-04-03 NOTE — Progress Notes (Signed)
eLink Physician-Brief Progress Note Patient Name: Jesse Hartman DOB: Oct 17, 1960 MRN: [id number]   Date of Service  04/03/2016  HPI/Events of Note  Persistent hypotension despite max NE and Vaso gtts.  Current BP of 65/37 (48).  Patient also with ongoing acidosis with pH of less than 7.2.  Now on CRRT  eICU Interventions  Plan: Start NEO gtt for BP support 2 amps of bicarb IVP Continue to monitor     Intervention Category Major Interventions: Shock - evaluation and management;Acid-Base disturbance - evaluation and management  Shawonda Kerce 04/03/2016, 10:22 PM

## 2016-04-03 NOTE — Progress Notes (Signed)
Wakeup assessment deferred due to patient instability and increased oxygen demands.

## 2016-04-03 NOTE — Progress Notes (Signed)
Panic ABG result called to Dr. Lynford Citizen PH 7.11 Pco2 above reportable range pO2 81.2

## 2016-04-03 NOTE — Progress Notes (Signed)
eLink Physician-Brief Progress Note Patient Name: KEYAAN LEDERMAN DOB: September 04, 1960 MRN: [id number]   Date of Service  04/03/2016  HPI/Events of Note  Hgb drop from 7.6 to 6.7  eICU Interventions  Plan: Transfuse 1 unit pRBC Post-transfusion CBC     Intervention Category Intermediate Interventions: Other:  DETERDING,ELIZABETH 04/03/2016, 5:08 AM

## 2016-04-03 NOTE — Progress Notes (Signed)
Vent changes per MD order 

## 2016-04-03 NOTE — Progress Notes (Signed)
Pt went bradycardic and PEA arrested at 2241 after mucous plug in ETT. MD bronched pt to clear ETT.  See code notes. Family was notified by MD.

## 2016-04-03 NOTE — Progress Notes (Signed)
PULMONARY / CRITICAL CARE MEDICINE   Name: Jesse Hartman MRN: [id number] DOB: 05/24/1960    ADMISSION DATE:  03/29/2016 CONSULTATION DATE:  03/10/2016  REFERRING MD:  Dr. Vanita Panda  CHIEF COMPLAINT:  AMS  HISTORY OF PRESENT ILLNESS:   56 year old male with PMH of ETOH abuse (24 pack per day), COPD, Barrett's esophagus, HTN, Seizures, and OSA. He was started on lasix and potassium supplementation approximately 3/12. He was found down by his daughter 3/19 and brought to ED via EMS,hypotensive, with AKI & hyperkalemia , cr 4.2  While in CT he had episode of projectile vomiting of coffee ground emesis. Despite IVF resuscitation he remained hypotensive . 3/19 PM cardiac arrest 6 mins, intubated. On pressors.    STUDIES:  CT head 3/19 > No evidence of acute intracranial abnormality. Generalized cerebral volume loss. Mild paranasal sinusitis of uncertain acuity . CT abd/pelv 3/19 > Stranding around the pancreas compatible with acute pancreatitis. Fatty infiltration of the liver. Distended, fluid-filled stomach. EGD 3/19 > Severe esophagitis with no acute hemorrhage  CULTURES: Blood 3/19 >> 1 /2  S viridans Urine 3/19 > insignificant growth Resp 3/21 >> abundant yeast, PMNs >> reincubated >>  Few yeast Blood 3/21 >> ng Urine 3/21 >> negative  LINES/TUBES: CVL 3/19 > ETT 3/19 > L fem art line 3/21 >3/28   ANTIBIOTICS: vanco 3/19 >> 3/20 vanco 3/22 >> 3/23 Zosyn 3/19 >  SIGNIFICANT EVENTS: 3/19 admit, ARF 3/19 PM cardiac arrest 6 mins, intubated. On pressors.  3/23 Increased infiltrates over the last 24 hours on chest x-ray ? aspiration Changed to propofol on 3/24 3/25  significant cough that causes auto PEEP and subsequently hypotension 3/29 - Remains Critically ill, intubated & sedated. Continues to have asynchrony  on vent On high dose levo gtt.Good UO    SUBJECTIVE/OVERNIGHT/INTERVAL HX 3/30 - los 11\ days. On levophed 61mcg. fio2 70%, peep 5 and sedated. On bic gtt. Makes  urine but rising creat. Per RN - was on CRRT for a week and has been off few to several days  VITAL SIGNS: BP (!) 89/51   Pulse (!) 107   Temp 98.6 F (37 C) (Core (Comment))   Resp 13   Ht 6' (1.829 m)   Wt (!) 136.9 kg (301 lb 13 oz)   SpO2 99%   BMI 40.93 kg/m   HEMODYNAMICS: CVP:  [14 mmHg-17 mmHg] 17 mmHg  VENTILATOR SETTINGS: Vent Mode: PCV FiO2 (%):  [70 %-100 %] 70 % Set Rate:  [15 bmp-28 bmp] 28 bmp PEEP:  [10 cmH20-14 cmH20] 14 cmH20 Plateau Pressure:  [24 cmH20-28 cmH20] 27 cmH20  INTAKE / OUTPUT: I/O last 3 completed shifts: In: 6952.3 [I.V.:4452.3; Blood:30; NG/GT:2220; IV Piggyback:250] Out: 0272 [Urine:1450]  PHYSICAL EXAMINATION:   General Appearance:    Looks criticall ill OBESE with beard +  Head:    Normocephalic, without obvious abnormality, atraumatic  Eyes:    PERRL - yes, conjunctiva/corneas - clear      Ears:    Normal external ear canals, both ears  Nose:   NG tube - no  Throat:  ETT TUBE - yes , OG tube - yes  Neck:   Supple,  No enlargement/tenderness/nodules     Lungs:     Clear to auscultation bilaterally, Ventilator   Synchrony - tachypneic with mild dysnchrony  Chest wall:    No deformity  Heart:    S1 and S2 normal, no murmur, CVP - no.  Pressors - yes  Abdomen:  Soft, no masses, no organomegaly  Genitalia:    Not done  Rectal:   not done  Extremities:   Extremities- intact     Skin:   Intact in exposed areas .      Neurologic:   Sedation - gtt -> RASS - -4    LABS: PULMONARY  Recent Labs Lab 03/30/16 1215 03/31/16 0405 04/01/16 1212 04/02/16 1855 04/02/16 2201 04/03/16 0110  PHART 7.411 7.404 7.309* 6.949* 7.221* 7.242*  PCO2ART 62.0* 52.8* 64.9* ABOVE REPORTABLE RANGE 84.6* 83.4*  PO2ART 56.0* 52.0* 62.0* 87.7 271* 160*  HCO3 39.4* 33.0* 32.8* 33.3* 33.2* 34.6*  TCO2 41 35 35  --   --   --   O2SAT 88.0 86.0 89.0 91.2 99.5 99.0    CBC  Recent Labs Lab 04/02/16 0445 04/03/16 0434 04/03/16 1113  HGB 7.6*  6.7* 7.2*  HCT 24.8* 22.6* 23.6*  WBC 12.7* 8.9 8.8  PLT 288 237 224    COAGULATION  Recent Labs Lab 03/28/16 0400 04/02/16 1412  INR 1.19 1.08    CARDIAC  No results for input(s): TROPONINI in the last 168 hours. No results for input(s): PROBNP in the last 168 hours.   CHEMISTRY  Recent Labs Lab 03/28/16 0400  03/30/16 0350 03/31/16 0449 04/01/16 0355 04/02/16 0445 04/03/16 0434  NA 153*  < > 152* 151* 149* 147* 148*  K 3.7  < > 3.4* 3.5 3.9 3.7 3.8  CL 107  < > 108 107 108 106 100*  CO2 36*  < > 34* 32 31 33* 38*  GLUCOSE 160*  < > 198* 151* 111* 151* 124*  BUN 45*  < > 47* 41* 45* 47* 57*  CREATININE 1.80*  < > 1.76* 1.62* 1.78* 2.05* 3.36*  CALCIUM 8.2*  < > 8.5* 8.6* 8.5* 8.5* 7.7*  MG 2.1  --  2.0  --   --  2.1 2.2  PHOS 3.5  --   --   --   --  6.4* 8.5*  < > = values in this interval not displayed. Estimated Creatinine Clearance: 36 mL/min (A) (by C-G formula based on SCr of 3.36 mg/dL (H)).   LIVER  Recent Labs Lab 03/28/16 0400 03/31/16 0449 04/02/16 1412  AST  --  100*  --   ALT  --  93*  --   ALKPHOS  --  295*  --   BILITOT  --  2.8*  --   PROT  --  6.0*  --   ALBUMIN  --  1.5*  --   INR 1.19  --  1.08     INFECTIOUS No results for input(s): LATICACIDVEN, PROCALCITON in the last 168 hours.   ENDOCRINE CBG (last 3)   Recent Labs  04/02/16 2354 04/03/16 0338 04/03/16 0827  GLUCAP 123* 104* 129*         IMAGING x48h  - image(s) personally visualized  -   highlighted in bold Dg Chest Port 1 View  Result Date: 04/03/2016 CLINICAL DATA:  Acute respiratory failure EXAM: PORTABLE CHEST 1 VIEW COMPARISON:  04/02/2016 FINDINGS: Endotracheal tube in good position. Left neck central venous catheter tip overlies the left lung apex unchanged and may be in a left external jugular vein branch. Feeding tube enters the stomach with the tip not visualized Severe diffuse bilateral airspace disease unchanged. No effusion or pneumothorax.  IMPRESSION: Severe bilateral airspace disease unchanged. Endotracheal tube remains in good position. Electronically Signed   By: Franchot Gallo M.D.   On:  04/03/2016 07:27   Dg Chest Port 1 View  Result Date: 04/02/2016 CLINICAL DATA:  Respiratory failure. EXAM: PORTABLE CHEST 1 VIEW COMPARISON:  04/01/2016. FINDINGS: Endotracheal tube and feeding tube in stable position. Diffuse bilateral severe airspace disease is again noted. Heart size stable. No pleural effusion or pneumothorax . IMPRESSION: 1. Lines and tubes in stable position. 2. Diffuse severe bilateral airspace disease again noted. No interim change. Electronically Signed   By: Marcello Moores  Register   On: 04/02/2016 07:31       DISCUSSION: 56 year old male admitted with AMS and AKI. Was improving but suffered massive coffee ground emesis and subsequent brief cardiac arrest. Developed ARDS & shock. Mental status intact but hypoxia very slow to improve  ASSESSMENT / PLAN:  PULMONARY A: Acute hypoxemic respiratory failure: suspect he aspirated on emesis, CXR and exam also concerning ALI vs edema. COPD without acute exacerbation. OSA. BL PNA -favor Aspiration  - 3/30 ARDS with fio2 70%, peep 14 -> PF ratio 228  P:   Vent settings reviewed & adjusted - keep trigger to -5, ct PCV 15 ,  PEEP 10, increase RR 15 Scheduled bronchodilators No role fopr nimbex or prone  On 04/03/2016 given PF ratio > 150  Plan for Tstomy when eligible  CARDIOVASCULAR A:  Shock: hemorrhagic, +/- septic  H/o HTN, CHF.   - still on levophed 3/30 , 14mcg P:  Levophed and vaso for MAP goal > 65 Goal CVP 10-12 range  RENAL A:   Acute renal failure - presumed ATN in setting dehydration/ARB/NSAIDs/shock. Improving  3/30 - off CRRT per RN. Creat rising but making urine  P:   Follow BMP, urine output Continue free water Nephrology signed off. ; might have to recall  GASTROINTESTINAL A:   GIB - severe esophagitis on EGD. No acute bleed so no  interventions performed. Acute pancreatitis - per CT but lipase / amylase normal, suspect more chronic . H/o Barrett's esophagus. Constipation   - on TF P:   tube feeding @ 40/h Hold off on GI consultation as his hemoglobin has stabilized Protonix twice a day. No BM since admit - repeatDulcolax suppository &  lactulose  HEMATOLOGIC A:   ABLA secondary to GIB, some continued blood loss, no hematemesis, no melena yet   - anemia of critical illness P:  Follow CBC Transfusion goal Hgb > 7  INFECTIOUS A:   Aspiration PNA, LLL Yeast resp cx, unclear significance  S. Viridans 1 of 2 blood cx, repeat negative so far   - sill on pressors, afebrile  P:   Continue Zosyn until 3/29, then ancef to complete 14 ds if resp improvement Vancomycin stopped on 3/23  ENDOCRINE A:   Hyperglycemia. P:   SSI  NEUROLOGIC A:   Acute metabolic encephalopathy: multifactorial - withdrawal, uremia, hepatic, sedation. At risk ETOH/benzo withdrawal.  - 3/30 - grimaces per RN on WUA  P:   Fent gtt and versed gtt Increase clonazepam 2 bid Decrease seroqeul 50 bid - qtc 500 range  FAMILY  - Updates:  updated daughter  3/29 - agreeable to trach, outlined guarded prognosis for full recovery from ARDS. No famioy at bedside 04/03/2016  - Inter-disciplinary family meet or Palliative Care meeting due by: 3/25     .  Rest per NP/medical resident whose note is outlined above and that I agree with  The patient is critically ill with multiple organ systems failure and requires high complexity decision making for assessment and support, frequent evaluation and  titration of therapies, application of advanced monitoring technologies and extensive interpretation of multiple databases.   Critical Care Time devoted to patient care services described in this note is  30  Minutes. This time reflects time of care of this signee Dr Brand Males. This critical care time does not reflect procedure time, or  teaching time or supervisory time of PA/NP/Med student/Med Resident etc but could involve care discussion time    Dr. Brand Males, M.D., [facility].C.P Pulmonary and Critical Care Medicine Staff Physician [facility] Pager: [phone], If no answer or between  15:00h - 7:00h: call [phone]  04/03/2016 12:25 PM

## 2016-04-03 NOTE — Procedures (Signed)
Arterial Catheter Insertion Procedure Note PRYNCE JACOBER [id number] 1960-05-08  Procedure: Insertion of Arterial Catheter  Indications: Blood pressure monitoring and Frequent blood sampling  Procedure Details Consent: Unable to obtain consent because of emergent medical necessity. Time Out: Verified patient identification, verified procedure, site/side was marked, verified correct patient position, special equipment/implants available, medications/allergies/relevent history reviewed, required imaging and test results available.  Performed  Maximum sterile technique was used including antiseptics, cap, gloves, gown, hand hygiene, mask and sheet. Skin prep: Chlorhexidine; local anesthetic administered 20 gauge catheter was inserted into right radial artery using the Seldinger technique.  Evaluation Blood flow good; BP tracing good. Complications: No apparent complications.   Bayard Beaver 04/03/2016

## 2016-04-04 ENCOUNTER — Inpatient Hospital Stay (HOSPITAL_COMMUNITY): Payer: Medicare Other

## 2016-04-04 LAB — MAGNESIUM: Magnesium: 2.2 mg/dL (ref 1.7–2.4)

## 2016-04-04 LAB — BLOOD GAS, ARTERIAL
ACID-BASE EXCESS: 10.1 mmol/L — AB (ref 0.0–2.0)
Bicarbonate: 35.7 mmol/L — ABNORMAL HIGH (ref 20.0–28.0)
Drawn by: 345601
FIO2: 100
LHR: 35 {breaths}/min
O2 SAT: 99.6 %
PATIENT TEMPERATURE: 98.7
PCO2 ART: 64 mmHg — AB (ref 32.0–48.0)
PEEP: 10 cmH2O
PH ART: 7.366 (ref 7.350–7.450)
PO2 ART: 238 mmHg — AB (ref 83.0–108.0)
VT: 0.5 mL

## 2016-04-04 LAB — RENAL FUNCTION PANEL
ALBUMIN: 1.4 g/dL — AB (ref 3.5–5.0)
Albumin: 1.2 g/dL — ABNORMAL LOW (ref 3.5–5.0)
Anion gap: 13 (ref 5–15)
Anion gap: 7 (ref 5–15)
BUN: 55 mg/dL — AB (ref 6–20)
BUN: 49 mg/dL — AB (ref 6–20)
CALCIUM: 7.4 mg/dL — AB (ref 8.9–10.3)
CHLORIDE: 95 mmol/L — AB (ref 101–111)
CO2: 35 mmol/L — ABNORMAL HIGH (ref 22–32)
CO2: 33 mmol/L — ABNORMAL HIGH (ref 22–32)
CREATININE: 3.5 mg/dL — AB (ref 0.61–1.24)
Calcium: 7 mg/dL — ABNORMAL LOW (ref 8.9–10.3)
Chloride: 98 mmol/L — ABNORMAL LOW (ref 101–111)
Creatinine, Ser: 3.11 mg/dL — ABNORMAL HIGH (ref 0.61–1.24)
GFR calc Af Amer: 21 mL/min — ABNORMAL LOW (ref >60)
GFR calc Af Amer: 25 mL/min — ABNORMAL LOW (ref >60)
GFR calc non Af Amer: 21 mL/min — ABNORMAL LOW (ref >60)
GFR, EST NON AFRICAN AMERICAN: 18 mL/min — AB (ref >60)
GLUCOSE: 118 mg/dL — AB (ref 65–99)
GLUCOSE: 140 mg/dL — AB (ref 65–99)
PHOSPHORUS: 6.2 mg/dL — AB (ref 2.5–4.6)
PHOSPHORUS: 5 mg/dL — AB (ref 2.5–4.6)
POTASSIUM: 4.8 mmol/L (ref 3.5–5.1)
Potassium: 4.1 mmol/L (ref 3.5–5.1)
Sodium: 143 mmol/L (ref 135–145)
Sodium: 138 mmol/L (ref 135–145)

## 2016-04-04 LAB — BASIC METABOLIC PANEL
ANION GAP: 13 (ref 5–15)
BUN: 57 mg/dL — ABNORMAL HIGH (ref 6–20)
CO2: 37 mmol/L — AB (ref 22–32)
Calcium: 7 mg/dL — ABNORMAL LOW (ref 8.9–10.3)
Chloride: 94 mmol/L — ABNORMAL LOW (ref 101–111)
Creatinine, Ser: 3.84 mg/dL — ABNORMAL HIGH (ref 0.61–1.24)
GFR calc Af Amer: 19 mL/min — ABNORMAL LOW (ref >60)
GFR calc non Af Amer: 16 mL/min — ABNORMAL LOW (ref >60)
GLUCOSE: 150 mg/dL — AB (ref 65–99)
POTASSIUM: 5 mmol/L (ref 3.5–5.1)
Sodium: 144 mmol/L (ref 135–145)

## 2016-04-04 LAB — POCT ACTIVATED CLOTTING TIME
ACTIVATED CLOTTING TIME: 147 s
ACTIVATED CLOTTING TIME: 158 s
ACTIVATED CLOTTING TIME: 164 s
ACTIVATED CLOTTING TIME: 164 s
ACTIVATED CLOTTING TIME: 169 s
ACTIVATED CLOTTING TIME: 175 s
ACTIVATED CLOTTING TIME: 169 s
Activated Clotting Time: 131 seconds
Activated Clotting Time: 153 seconds
Activated Clotting Time: 153 seconds
Activated Clotting Time: 158 seconds
Activated Clotting Time: 153 seconds
Activated Clotting Time: 158 seconds
Activated Clotting Time: 158 seconds
Activated Clotting Time: 164 seconds
Activated Clotting Time: 164 seconds
Activated Clotting Time: 164 seconds
Activated Clotting Time: 175 seconds
Activated Clotting Time: 175 seconds
Activated Clotting Time: 175 seconds

## 2016-04-04 LAB — CBC WITH DIFFERENTIAL/PLATELET
BASOS ABS: 0 10*3/uL (ref 0.0–0.1)
Basophils Relative: 0 %
Eosinophils Absolute: 0.2 10*3/uL (ref 0.0–0.7)
Eosinophils Relative: 2 %
HEMATOCRIT: 23.3 % — AB (ref 39.0–52.0)
HEMOGLOBIN: 7 g/dL — AB (ref 13.0–17.0)
LYMPHS PCT: 10 %
Lymphs Abs: 1 10*3/uL (ref 0.7–4.0)
MCH: 29 pg (ref 26.0–34.0)
MCHC: 30 g/dL (ref 30.0–36.0)
MCV: 96.7 fL (ref 78.0–100.0)
MONO ABS: 0.5 10*3/uL (ref 0.1–1.0)
MONOS PCT: 5 %
NEUTROS ABS: 8.5 10*3/uL — AB (ref 1.7–7.7)
NEUTROS PCT: 83 %
Platelets: 243 10*3/uL (ref 150–400)
RBC: 2.41 MIL/uL — ABNORMAL LOW (ref 4.22–5.81)
RDW: 19.9 % — AB (ref 11.5–15.5)
WBC: 10.2 10*3/uL (ref 4.0–10.5)

## 2016-04-04 LAB — PHOSPHORUS: Phosphorus: 6.1 mg/dL — ABNORMAL HIGH (ref 2.5–4.6)

## 2016-04-04 LAB — GLUCOSE, CAPILLARY
GLUCOSE-CAPILLARY: 116 mg/dL — AB (ref 65–99)
GLUCOSE-CAPILLARY: 166 mg/dL — AB (ref 65–99)
GLUCOSE-CAPILLARY: 140 mg/dL — AB (ref 65–99)
Glucose-Capillary: 109 mg/dL — ABNORMAL HIGH (ref 65–99)
Glucose-Capillary: 120 mg/dL — ABNORMAL HIGH (ref 65–99)

## 2016-04-04 LAB — TRIGLYCERIDES: Triglycerides: 416 mg/dL — ABNORMAL HIGH (ref <150)

## 2016-04-04 LAB — TYPE AND SCREEN
ABO/RH(D): A POS
ANTIBODY SCREEN: NEGATIVE
UNIT DIVISION: 0

## 2016-04-04 LAB — BPAM RBC
Blood Product Expiration Date: 201804052359
ISSUE DATE / TIME: 201803300625
UNIT TYPE AND RH: 6200

## 2016-04-04 LAB — APTT: APTT: 35 s (ref 24–36)

## 2016-04-04 MED ORDER — CEFAZOLIN SODIUM-DEXTROSE 2-4 GM/100ML-% IV SOLN
2.0000 g | Freq: Two times a day (BID) | INTRAVENOUS | Status: AC
  Administered 2016-04-04 – 2016-04-05 (×3): 2 g via INTRAVENOUS
  Filled 2016-04-04 (×3): qty 100

## 2016-04-04 MED ORDER — FENTANYL CITRATE (PF) 100 MCG/2ML IJ SOLN
100.0000 ug | Freq: Once | INTRAMUSCULAR | Status: DC | PRN
Start: 2016-04-04 — End: 2016-04-06

## 2016-04-04 MED ORDER — MIDAZOLAM HCL 5 MG/ML IJ SOLN
2.0000 mg/h | INTRAMUSCULAR | Status: DC
  Administered 2016-04-04: 7 mg/h via INTRAVENOUS
  Administered 2016-04-04 – 2016-04-06 (×9): 8 mg/h via INTRAVENOUS
  Filled 2016-04-04 (×9): qty 10

## 2016-04-04 MED ORDER — ARTIFICIAL TEARS OP OINT
1.0000 "application " | TOPICAL_OINTMENT | Freq: Three times a day (TID) | OPHTHALMIC | Status: DC
  Administered 2016-04-04 – 2016-04-06 (×8): 1 via OPHTHALMIC
  Filled 2016-04-04: qty 3.5

## 2016-04-04 MED ORDER — PANTOPRAZOLE SODIUM 40 MG PO PACK
40.0000 mg | PACK | Freq: Every day | ORAL | Status: DC
  Administered 2016-04-05 – 2016-04-08 (×4): 40 mg
  Filled 2016-04-04 (×5): qty 20

## 2016-04-04 MED ORDER — MIDAZOLAM HCL 2 MG/2ML IJ SOLN
2.0000 mg | Freq: Once | INTRAMUSCULAR | Status: AC
  Administered 2016-04-04: 2 mg via INTRAVENOUS

## 2016-04-04 MED ORDER — MIDAZOLAM HCL 2 MG/2ML IJ SOLN: 2.0000 mg | Freq: Once | INTRAMUSCULAR | Status: DC | PRN

## 2016-04-04 MED ORDER — MIDAZOLAM BOLUS VIA INFUSION
2.0000 mg | INTRAVENOUS | Status: DC | PRN
  Filled 2016-04-04: qty 2

## 2016-04-04 MED ORDER — FENTANYL BOLUS VIA INFUSION
50.0000 ug | INTRAVENOUS | Status: DC | PRN
  Filled 2016-04-04: qty 50

## 2016-04-04 MED ORDER — SODIUM CHLORIDE 0.9 % IV SOLN
3.0000 ug/kg/min | INTRAVENOUS | Status: DC
  Administered 2016-04-04: 4 ug/kg/min via INTRAVENOUS
  Administered 2016-04-04: 3 ug/kg/min via INTRAVENOUS
  Administered 2016-04-04: 2.398 ug/kg/min via INTRAVENOUS
  Administered 2016-04-05: 3 ug/kg/min via INTRAVENOUS
  Administered 2016-04-05: 3.5 ug/kg/min via INTRAVENOUS
  Administered 2016-04-06: 3 ug/kg/min via INTRAVENOUS
  Filled 2016-04-04 (×8): qty 20

## 2016-04-04 MED ORDER — FENTANYL 2500MCG IN NS 250ML (10MCG/ML) PREMIX INFUSION
100.0000 ug/h | INTRAVENOUS | Status: DC
  Administered 2016-04-04: 200 ug/h via INTRAVENOUS
  Administered 2016-04-04 (×3): 250 ug/h via INTRAVENOUS
  Administered 2016-04-05 – 2016-04-06 (×6): 300 ug/h via INTRAVENOUS
  Filled 2016-04-04 (×7): qty 250

## 2016-04-04 MED ORDER — PROPOFOL 1000 MG/100ML IV EMUL
5.0000 ug/kg/min | INTRAVENOUS | Status: DC
  Administered 2016-04-04: 45 ug/kg/min via INTRAVENOUS
  Administered 2016-04-04: 40 ug/kg/min via INTRAVENOUS
  Administered 2016-04-04: 10 ug/kg/min via INTRAVENOUS
  Administered 2016-04-04: 30 ug/kg/min via INTRAVENOUS
  Filled 2016-04-04 (×2): qty 100
  Filled 2016-04-04: qty 200

## 2016-04-04 MED ORDER — FENTANYL CITRATE (PF) 100 MCG/2ML IJ SOLN
100.0000 ug | Freq: Once | INTRAMUSCULAR | Status: AC
  Administered 2016-04-04: 100 ug via INTRAVENOUS

## 2016-04-04 MED FILL — Medication: Qty: 1 | Status: AC

## 2016-04-04 NOTE — Progress Notes (Signed)
eLink Physician-Brief Progress Note Patient Name: Jesse Hartman DOB: 12/27/1960 MRN: [id number]   Date of Service  04/04/2016  HPI/Events of Note  Patient with severe hypotension earlier despite max pressor support.  Camera check shows patient with vent dyssynchrony guppy breathing on set rate of 35/min.  Concern for breath stacking and associated hypotension  eICU Interventions  Plan: Paralytic Change vent setting to PRVC>>when these changes were made it coincided with a drop in pressure and sats.  Fellow to bedside.  Clinical assessment indicated the patient had plugged ETT.  Once this was cleared sats and BP improved>>plan to keep on paralytic in the setting of ARDS picture.     Intervention Category Major Interventions: Respiratory failure - evaluation and management  Gilmer Kaminsky 04/04/2016, 12:14 AM

## 2016-04-04 NOTE — Progress Notes (Signed)
PULMONARY / CRITICAL CARE MEDICINE   Name: Jesse Hartman MRN: [id number] DOB: 1960/07/28    ADMISSION DATE:  03/13/2016 CONSULTATION DATE:  03/05/2016  REFERRING MD:  Dr. Vanita Panda  CHIEF COMPLAINT:  AMS  HISTORY OF PRESENT ILLNESS:   56 year old male with PMH of ETOH abuse (24 pack per day), COPD, Barrett's esophagus, HTN, Seizures, and OSA. He was started on lasix and potassium supplementation approximately 3/12. He was found down by his daughter 3/19 and brought to ED via EMS,hypotensive, with AKI & hyperkalemia , cr 4.2  While in CT he had episode of projectile vomiting of coffee ground emesis. Despite IVF resuscitation he remained hypotensive . 3/19 PM cardiac arrest 6 mins, intubated. On pressors.    STUDIES:  CT head 3/19 > No evidence of acute intracranial abnormality. Generalized cerebral volume loss. Mild paranasal sinusitis of uncertain acuity . CT abd/pelv 3/19 > Stranding around the pancreas compatible with acute pancreatitis. Fatty infiltration of the liver. Distended, fluid-filled stomach. EGD 3/19 > Severe esophagitis with no acute hemorrhage  CULTURES: Blood 3/19 >> 1 /2  S viridans Urine 3/19 > insignificant growth Resp 3/21 >> abundant yeast, PMNs >> reincubated >>  Few yeast Blood 3/21 >> ng Urine 3/21 >> negative  LINES/TUBES: CVL 3/19 > ETT 3/19 > L fem art line 3/21 >3/28   ANTIBIOTICS: vanco 3/19 >> 3/20 vanco 3/22 >> 3/23 Zosyn 3/19 >  SIGNIFICANT EVENTS: 3/19 admit, ARF 3/19 PM cardiac arrest 6 mins, intubated. On pressors.  3/23 Increased infiltrates over the last 24 hours on chest x-ray ? aspiration Changed to propofol on 3/24 3/25  significant cough that causes auto PEEP and subsequently hypotension 3/29 - Remains Critically ill, intubated & sedated. Continues to have asynchrony  on vent On high dose levo gtt.Good UO  3/30 - los 11\ days. On levophed 57mcg. fio2 70%, peep 5 and sedated. On bic gtt. Makes urine but rising creat. Per RN - was on  CRRT for a week and has been off few to several days    SUBJECTIVE/OVERNIGHT/INTERVAL HX 04/04/16 - overnight brief cpr. On CRRT . Nimbex +. On 2 pressors. fio2 60%. Daughter Adrea at bedsdie = says dad might not want to go trhough all this and thinking terminal wean. She is agreeable to DNR as first step but needs to talk to elder sister/daugghter  VITAL SIGNS: BP 111/69   Pulse 89   Temp 98.7 F (37.1 C) (Oral)   Resp (!) 35   Ht 6' (1.829 m)   Wt (!) 139.2 kg (306 lb 14.1 oz)   SpO2 100%   BMI 41.62 kg/m   HEMODYNAMICS: CVP:  [5 mmHg-20 mmHg] 5 mmHg  VENTILATOR SETTINGS: Vent Mode: PRVC FiO2 (%):  [60 %-100 %] 60 % Set Rate:  [28 bmp-35 bmp] 35 bmp Vt Set:  [500 mL] 500 mL PEEP:  [10 cmH20-14 cmH20] 10 cmH20 Plateau Pressure:  [21 cmH20-35 cmH20] 28 cmH20  INTAKE / OUTPUT: I/O last 3 completed shifts: In: 9938.1 [I.V.:5956.4; Blood:405; NG/GT:3126.7; IV Piggyback:450] Out: 2915 [Urine:269; Other:2646]  PHYSICAL EXAMINATION:    General Appearance:    Looks criticall ill OBESE - yes  Head:    Normocephalic, without obvious abnormality, atraumatic  Eyes:    PERRL - yes, conjunctiva/corneas - clear      Ears:    Normal external ear canals, both ears  Nose:   NG tube - no  Throat:  ETT TUBE - yes , OG tube - yes  Neck:  Supple,  No enlargement/tenderness/nodules     Lungs:     Crackles +. Nimbex and sedation gtt. With vent syncrony3  Chest wall:    No deformity  Heart:    S1 and S2 normal, no murmur, CVP - no.  Pressors - yes  Abdomen:     Soft, no masses, no organomegaly  Genitalia:    Not done  Rectal:   not done  Extremities:   Extremities- edema     Skin:   Intact in exposed areas .      Neurologic:   Sedation - sedation gtt an dnimbex  -> RASS - -4/-5      LABS: PULMONARY  Recent Labs Lab 03/30/16 1215 03/31/16 0405 04/01/16 1212  04/03/16 0110 04/03/16 1256 04/03/16 1445 04/03/16 2305 04/04/16 0255  PHART 7.411 7.404 7.309*  < > 7.242*  7.170* 7.117* 7.118* 7.366  PCO2ART 62.0* 52.8* 64.9*  < > 83.4* 108* ABOVE REPORTABLE RANGE ABOVE REPORTABLE RANGE 64.0*  PO2ART 56.0* 52.0* 62.0*  < > 160* 88.8 81.2* 88.0 238*  HCO3 39.4* 33.0* 32.8*  < > 34.6* 37.9* 37.7* 39.8* 35.7*  TCO2 41 35 35  --   --   --   --   --   --   O2SAT 88.0 86.0 89.0  < > 99.0 94.8 92.6 95.4 99.6  < > = values in this interval not displayed.  CBC  Recent Labs Lab 04/03/16 0434 04/03/16 1113 04/04/16 0350  HGB 6.7* 7.2* 7.0*  HCT 22.6* 23.6* 23.3*  WBC 8.9 8.8 10.2  PLT 237 224 243    COAGULATION  Recent Labs Lab 04/02/16 1412  INR 1.08    CARDIAC  No results for input(s): TROPONINI in the last 168 hours. No results for input(s): PROBNP in the last 168 hours.   CHEMISTRY  Recent Labs Lab 03/30/16 0350  04/01/16 0355 04/02/16 0445 04/03/16 0434 04/04/16 0000 04/04/16 0350 04/04/16 0351  NA 152*  < > 149* 147* 148* 144  --  143  K 3.4*  < > 3.9 3.7 3.8 5.0  --  4.1  CL 108  < > 108 106 100* 94*  --  95*  CO2 34*  < > 31 33* 38* 37*  --  35*  GLUCOSE 198*  < > 111* 151* 124* 150*  --  118*  BUN 47*  < > 45* 47* 57* 57*  --  55*  CREATININE 1.76*  < > 1.78* 2.05* 3.36* 3.84*  --  3.50*  CALCIUM 8.5*  < > 8.5* 8.5* 7.7* 7.0*  --  7.0*  MG 2.0  --   --  2.1 2.2  --  2.2  --   PHOS  --   --   --  6.4* 8.5*  --  6.1* 6.2*  < > = values in this interval not displayed. Estimated Creatinine Clearance: 34.9 mL/min (A) (by C-G formula based on SCr of 3.5 mg/dL (H)).   LIVER  Recent Labs Lab 03/31/16 0449 04/02/16 1412 04/04/16 0351  AST 100*  --   --   ALT 93*  --   --   ALKPHOS 295*  --   --   BILITOT 2.8*  --   --   PROT 6.0*  --   --   ALBUMIN 1.5*  --  1.2*  INR  --  1.08  --      INFECTIOUS No results for input(s): LATICACIDVEN, PROCALCITON in the last 168 hours.   ENDOCRINE CBG (  last 3)   Recent Labs  04/03/16 2342 04/04/16 0347 04/04/16 0815  GLUCAP 136* 109* 116*         IMAGING x48h  -  image(s) personally visualized  -   highlighted in bold Dg Chest Port 1 View  Result Date: 04/04/2016 CLINICAL DATA:  Intubated. EXAM: PORTABLE CHEST 1 VIEW COMPARISON:  Yesterday. FINDINGS: Endotracheal tube in satisfactory position. Feeding tube extending into the stomach. Left jugular catheter tip overlying the medial left lung apex without significant change. The cardiac silhouette remains borderline enlarged. No significant change in patchy airspace opacity throughout both lungs. No pleural fluid. Thoracic spine degenerative changes. IMPRESSION: 1. The left jugular catheter tip remains overlying the medial left lung apex. This is most likely in the left innominate vein. 2. Stable bilateral alveolar edema, pneumonia or ARDS. Electronically Signed   By: [name] M.D.   On: 04/04/2016 07:28   Dg Chest Port 1 View  Result Date: 04/03/2016 CLINICAL DATA:  Initial evaluation for ventilator status. EXAM: PORTABLE CHEST 1 VIEW COMPARISON:  Prior radiograph from earlier the same day. FINDINGS: Endotracheal tube in place with tip positioned well above the carina. Enteric tube courses in the the abdomen. Left IJ approach centra venous catheter is stable with tip overlying the left upper lobe. Defibrillator pads in place. Stable cardiomegaly. Mediastinal silhouette within normal limits. Multifocal parenchymal and interstitial opacities again seen involving the lungs bilaterally, with greatest involvement within the right upper and left lower lobes. Overall opacities are slightly improved from previous. No pleural effusion. No pneumothorax. No acute osseous abnormality. IMPRESSION: 1. Slight interval improvement in diffuse multifocal interstitial and airspace opacities as compared to radiograph performed earlier the same day. 2. Stable support apparatus. Electronically Signed   By: Jeannine Boga M.D.   On: 04/03/2016 23:02   Dg Chest Port 1 View  Result Date: 04/03/2016 CLINICAL DATA:  Followup  pulmonary status. EXAM: PORTABLE CHEST 1 VIEW COMPARISON:  Earlier film, same date. FINDINGS: The endotracheal tube is 6.9 cm above the carina. There is a feeding tube coursing down the esophagus and into the stomach. A left IJ catheter tip is in the region of the left brachiocephalic vein. Persistent diffuse interstitial and airspace process. No pneumothorax or pleural effusion. IMPRESSION: Persistent diffuse interstitial and airspace process but no large pleural effusion or pneumothorax. Stable support apparatus. Electronically Signed   By: Marijo Sanes M.D.   On: 04/03/2016 15:34   Dg Chest Port 1 View  Result Date: 04/03/2016 CLINICAL DATA:  Acute respiratory failure EXAM: PORTABLE CHEST 1 VIEW COMPARISON:  04/02/2016 FINDINGS: Endotracheal tube in good position. Left neck central venous catheter tip overlies the left lung apex unchanged and may be in a left external jugular vein branch. Feeding tube enters the stomach with the tip not visualized Severe diffuse bilateral airspace disease unchanged. No effusion or pneumothorax. IMPRESSION: Severe bilateral airspace disease unchanged. Endotracheal tube remains in good position. Electronically Signed   By: Franchot Gallo M.D.   On: 04/03/2016 07:27       DISCUSSION: 56 year old male admitted with AMS and AKI. Was improving but suffered massive coffee ground emesis and subsequent brief cardiac arrest. Developed ARDS & shock. Mental status intact but hypoxia very slow to improve  ASSESSMENT / PLAN:  PULMONARY A: Acute hypoxemic respiratory failure: suspect he aspirated on emesis, CXR and exam also concerning ALI vs edema. COPD without acute exacerbation. OSA. BL PNA -favor Aspiration  - 3/31 ARDS with fio2 60%, peep  10->  P:   PCV vent Scheduled bronchodilators Nimbex from 04/04/16 - plan 73 th 72h Plan for Tstomy when eligible - if family concurs  CARDIOVASCULAR A:  Shock: hemorrhagic, +/- septic  H/o HTN, CHF.   -septic shock  continues  P:  Neop and Levophed and vaso for MAP goal > 65 Goal CVP 10-12 range  RENAL A:   Acute renal failure - presumed ATN in setting dehydration/ARB/NSAIDs/shock. Improving  3/31 - on CRRT  P:   Per renal  GASTROINTESTINAL A:   GIB - severe esophagitis on EGD. No acute bleed so no interventions performed. Acute pancreatitis - per CT but lipase / amylase normal, suspect more chronic . H/o Barrett's esophagus. Constipation   - on TF P:   tube feeding Hold off on GI consultation as his hemoglobin has stabilized but recall at evidence of GIB Protonix twice a day. No BM since admit - repeatDulcolax suppository &  lactulose  HEMATOLOGIC A:   ABLA secondary to GIB, some continued blood loss, no hematemesis, no melena yet   - anemia of critical illness P:  Follow CBC Transfusion goal Hgb > 7  INFECTIOUS A:   Aspiration PNA, LLL Yeast resp cx, unclear significance  S. Viridans 1 of 2 blood cx, repeat negative so far   - sill on pressors, afebrile  P:   Continue Zosyn until 3/29, then ancef to complete 14 ds if resp improvement Vancomycin stopped on 3/23  ENDOCRINE A:   Hyperglycemia. P:   SSI  NEUROLOGIC A:   Acute metabolic encephalopathy: multifactorial - withdrawal, uremia, hepatic, sedation. At risk ETOH/benzo withdrawal.  - 3/31 -deeply sedated and paraluyzed  P   Fent gtt and versed gtt Nimbex gtt Dc klonopin Dc diprivan Dc deroquel  FAMILY  - Updates:  updated daughter  3/29 - agreeable to trach, outlined guarded prognosis for full recovery from ARDS.   - Inter-disciplinary family meet or Palliative Care meeting due by: 3/25 - dopne 3/30 and again 04/04/16 - duahgter leaning towards DNR and in fe days possible terminal wean. She is talking to other sibling to get buy ion     .  Rest per NP/medical resident whose note is outlined above and that I agree with  The patient is critically ill with multiple organ systems failure and  requires high complexity decision making for assessment and support, frequent evaluation and titration of therapies, application of advanced monitoring technologies and extensive interpretation of multiple databases.   Critical Care Time devoted to patient care services described in this note is  40  Minutes. This time reflects time of care of this signee Dr Brand Males. This critical care time does not reflect procedure time, or teaching time or supervisory time of PA/NP/Med student/Med Resident etc but could involve care discussion time    Dr. Brand Males, M.D., [facility].C.P Pulmonary and Critical Care Medicine Staff Physician [facility] Pager: [phone], If no answer or between  15:00h - 7:00h: call [phone]  04/04/2016 9:47 AM

## 2016-04-04 NOTE — Progress Notes (Addendum)
Called by eLink as patient had worsening hypoxia on vent. Vec given shortly before arrival and changed from pressure mode to [facility]. Found RT and RNs bagging patient, but very difficult to bag. RT attempted lavage / suction of the patient. Purulent secretions were obtained, but still unable to bag. Patient developed bradycardia and circulatory arrest. CPR / ACLS started for ~2 mins, including 1 dose of epi. A bronchoscope was called for, and repeated attempts to suction patient were made while CPR was underway. Finally, a large amount of purulent mucus was suctioned, with good response in SpO2 and ability to bag patient. ROSC was obtained. A bronchospe arrived, and a quick inspection bronch was performed without obstructing lesions seen.  I called the patient's daughter, Joseph Art and gave update. She was emotional but I encouraged her to stay at home as she seemed too distraught to safely travel.  CRITICAL CARE Performed by: Luz Brazen   Total critical care time: 35 minutes  Critical care time was exclusive of separately billable procedures and treating other patients.  Critical care was necessary to treat or prevent imminent or life-threatening deterioration.  Critical care was time spent personally by me on the following activities: development of treatment plan with patient and/or surrogate as well as nursing, discussions with consultants, evaluation of patient's response to treatment, examination of patient, obtaining history from patient or surrogate, ordering and performing treatments and interventions, ordering and review of laboratory studies, ordering and review of radiographic studies, pulse oximetry and re-evaluation of patient's condition.   Luz Brazen, MD Pulmonary & Critical Care Medicine April 04, 2016, 3:25 AM

## 2016-04-04 NOTE — Progress Notes (Signed)
1. AKI-CRRT began on 3/30---cont. CRRT for now 2. Sepsis/VDRF 3. COPD/OSA  Subjective: Interval History: Mucous plug last PM assoc with PAE arrest  Objective: Vital signs in last 24 hours: Temp:  [98 F (36.7 C)-98.7 F (37.1 C)] 98.7 F (37.1 C) (03/31 0813) Pulse Rate:  [76-110] 93 (03/31 1015) Resp:  [11-39] 35 (03/31 1015) BP: (78-149)/(43-104) 102/62 (03/31 1002) SpO2:  [91 %-100 %] 97 % (03/31 1015) Arterial Line BP: (60-175)/(35-70) 114/55 (03/31 1015) FiO2 (%):  [50 %-100 %] 50 % (03/31 1002) Weight:  [139.2 kg (306 lb 14.1 oz)] 139.2 kg (306 lb 14.1 oz) (03/31 0320) Weight change: 2.3 kg (5 lb 1.1 oz)  Intake/Output from previous day: 03/30 0701 - 03/31 0700 In: 0932 [I.V.:3749.4; Blood:375; NG/GT:1756.7; IV [id number]:983] Out: 2740 [Urine:94] Intake/Output this shift: Total I/O In: 229.2 [I.V.:149.2; NG/GT:80] Out: 628 [Other:628]  General appearance: paralyzed Resp: vented sounds Cardio: regular rate and rhythm, S1, S2 normal, no murmur, click, rub or gallop  Lab Results:  Recent Labs  04/03/16 1113 04/04/16 0350  WBC 8.8 10.2  HGB 7.2* 7.0*  HCT 23.6* 23.3*  PLT 224 243   BMET:  Recent Labs  04/04/16 0000 04/04/16 0351  NA 144 143  K 5.0 4.1  CL 94* 95*  CO2 37* 35*  GLUCOSE 150* 118*  BUN 57* 55*  CREATININE 3.84* 3.50*  CALCIUM 7.0* 7.0*   No results for input(s): PTH in the last 72 hours. Iron Studies: No results for input(s): IRON, TIBC, TRANSFERRIN, FERRITIN in the last 72 hours. Studies/Results: Dg Chest Port 1 View  Result Date: 04/04/2016 CLINICAL DATA:  Intubated. EXAM: PORTABLE CHEST 1 VIEW COMPARISON:  Yesterday. FINDINGS: Endotracheal tube in satisfactory position. Feeding tube extending into the stomach. Left jugular catheter tip overlying the medial left lung apex without significant change. The cardiac silhouette remains borderline enlarged. No significant change in patchy airspace opacity throughout both lungs. No pleural  fluid. Thoracic spine degenerative changes. IMPRESSION: 1. The left jugular catheter tip remains overlying the medial left lung apex. This is most likely in the left innominate vein. 2. Stable bilateral alveolar edema, pneumonia or ARDS. Electronically Signed   By: [name] M.D.   On: 04/04/2016 07:28   Dg Chest Port 1 View  Result Date: 04/03/2016 CLINICAL DATA:  Initial evaluation for ventilator status. EXAM: PORTABLE CHEST 1 VIEW COMPARISON:  Prior radiograph from earlier the same day. FINDINGS: Endotracheal tube in place with tip positioned well above the carina. Enteric tube courses in the the abdomen. Left IJ approach centra venous catheter is stable with tip overlying the left upper lobe. Defibrillator pads in place. Stable cardiomegaly. Mediastinal silhouette within normal limits. Multifocal parenchymal and interstitial opacities again seen involving the lungs bilaterally, with greatest involvement within the right upper and left lower lobes. Overall opacities are slightly improved from previous. No pleural effusion. No pneumothorax. No acute osseous abnormality. IMPRESSION: 1. Slight interval improvement in diffuse multifocal interstitial and airspace opacities as compared to radiograph performed earlier the same day. 2. Stable support apparatus. Electronically Signed   By: Jeannine Boga M.D.   On: 04/03/2016 23:02   Dg Chest Port 1 View  Result Date: 04/03/2016 CLINICAL DATA:  Followup pulmonary status. EXAM: PORTABLE CHEST 1 VIEW COMPARISON:  Earlier film, same date. FINDINGS: The endotracheal tube is 6.9 cm above the carina. There is a feeding tube coursing down the esophagus and into the stomach. A left IJ catheter tip is in the region of the left brachiocephalic  vein. Persistent diffuse interstitial and airspace process. No pneumothorax or pleural effusion. IMPRESSION: Persistent diffuse interstitial and airspace process but no large pleural effusion or pneumothorax. Stable support  apparatus. Electronically Signed   By: Marijo Sanes M.D.   On: 04/03/2016 15:34   Dg Chest Port 1 View  Result Date: 04/03/2016 CLINICAL DATA:  Acute respiratory failure EXAM: PORTABLE CHEST 1 VIEW COMPARISON:  04/02/2016 FINDINGS: Endotracheal tube in good position. Left neck central venous catheter tip overlies the left lung apex unchanged and may be in a left external jugular vein branch. Feeding tube enters the stomach with the tip not visualized Severe diffuse bilateral airspace disease unchanged. No effusion or pneumothorax. IMPRESSION: Severe bilateral airspace disease unchanged. Endotracheal tube remains in good position. Electronically Signed   By: Franchot Gallo M.D.   On: 04/03/2016 07:27    Scheduled: . artificial tears  1 application Both Eyes N5Z  .  ceFAZolin (ANCEF) IV  2 g Intravenous Q8H  . chlorhexidine gluconate (MEDLINE KIT)  15 mL Mouth Rinse BID  . Chlorhexidine Gluconate Cloth  6 each Topical Daily  . feeding supplement (VITAL HIGH PROTEIN)  1,000 mL Per Tube Q24H  . folic acid  1 mg Per Tube Daily  . insulin aspart  0-15 Units Subcutaneous Q4H  . ipratropium-albuterol  3 mL Nebulization Q6H  . mouth rinse  15 mL Mouth Rinse QID  . pantoprazole sodium  40 mg Per Tube BID  . sodium chloride flush  10-40 mL Intracatheter Q12H  . thiamine  100 mg Per Tube Daily     LOS: 12 days   Keric Zehren C 04/04/2016,10:22 AM

## 2016-04-04 NOTE — Progress Notes (Signed)
eLink Physician-Brief Progress Note Patient Name: Jesse Hartman DOB: [birth year] MRN: [id number]   Date of Service  04/04/2016  HPI/Events of Note  Given that the patient has a poor prognosis d/t ALI/ARDS and the fact that the patient has coded X 2. Mr. Altamese Cabal daughter's, Tillie Rung and Dellis Filbert, desire a limited code status. They don't want CPR or Defibrillation at this time. Will we continue all other modes of support.   eICU Interventions  Will order limited code - No CPR and no Defibrillation.      Intervention Category Major Interventions: End of life / care limitation discussion  Lysle Dingwall 04/04/2016, 5:38 PM

## 2016-04-04 NOTE — Procedures (Signed)
Bronchoscopy Procedure Note  Date of Operation: 04/04/2016  Pre-op Diagnosis: Hypoxia, ARDS, mucus plug  Post-op Diagnosis: Hypoxia, ARDS  Surgeon: Luz Brazen  Assistants: None  Anesthesia: ICU sedation + paralytics  Operation: Flexible fiberoptic bronchoscopy, diagnostic  Findings: Normal-appearing airways to 1st segments  Specimen: None  Estimated Blood Loss: Minimal  Drains: None  Complications: None from procedure (was post cardiac-arrest)  Indications and History: The patient is a 56 y.o. male with ARDS, hypoxic arrest believed to be due to mucus plugs. The procedure was done on an emergent basis without consent.  Description of Procedure: The patient already had deep sedation for ventilation ICU. The bronchoscope was passed through the ETT . The scope was then passed into the trachea. No topical anesthesia was used. Careful inspection of the tracheal lumen was accomplished. The scope was sequentially passed into the left main and then left upper and lower bronchi and segmental bronchi. No mucus plugs were seen, and due to the critical illness of the patient, the procedure was kept very brief.   Attestation: I performed the procedure.  Elaysia Devargas

## 2016-04-05 ENCOUNTER — Inpatient Hospital Stay (HOSPITAL_COMMUNITY): Payer: Medicare Other

## 2016-04-05 LAB — GLUCOSE, CAPILLARY
GLUCOSE-CAPILLARY: 126 mg/dL — AB (ref 65–99)
GLUCOSE-CAPILLARY: 130 mg/dL — AB (ref 65–99)
GLUCOSE-CAPILLARY: 110 mg/dL — AB (ref 65–99)
Glucose-Capillary: 128 mg/dL — ABNORMAL HIGH (ref 65–99)
Glucose-Capillary: 142 mg/dL — ABNORMAL HIGH (ref 65–99)
Glucose-Capillary: 146 mg/dL — ABNORMAL HIGH (ref 65–99)

## 2016-04-05 LAB — CBC WITH DIFFERENTIAL/PLATELET
Basophils Absolute: 0 10*3/uL (ref 0.0–0.1)
Basophils Relative: 0 %
EOS PCT: 3 %
Eosinophils Absolute: 0.3 10*3/uL (ref 0.0–0.7)
HEMATOCRIT: 22.9 % — AB (ref 39.0–52.0)
Hemoglobin: 7.3 g/dL — ABNORMAL LOW (ref 13.0–17.0)
LYMPHS ABS: 0.8 10*3/uL (ref 0.7–4.0)
LYMPHS PCT: 8 %
MCH: 30.3 pg (ref 26.0–34.0)
MCHC: 31.9 g/dL (ref 30.0–36.0)
MCV: 95 fL (ref 78.0–100.0)
MONO ABS: 0.7 10*3/uL (ref 0.1–1.0)
Monocytes Relative: 7 %
NEUTROS ABS: 7.7 10*3/uL (ref 1.7–7.7)
Neutrophils Relative %: 82 %
PLATELETS: 264 10*3/uL (ref 150–400)
RBC: 2.41 MIL/uL — AB (ref 4.22–5.81)
RDW: 19.3 % — AB (ref 11.5–15.5)
WBC: 9.4 10*3/uL (ref 4.0–10.5)

## 2016-04-05 LAB — PHOSPHORUS: Phosphorus: 4.9 mg/dL — ABNORMAL HIGH (ref 2.5–4.6)

## 2016-04-05 LAB — RENAL FUNCTION PANEL
ALBUMIN: 1.4 g/dL — AB (ref 3.5–5.0)
ALBUMIN: 1.4 g/dL — AB (ref 3.5–5.0)
ANION GAP: 10 (ref 5–15)
Anion gap: 9 (ref 5–15)
BUN: 41 mg/dL — ABNORMAL HIGH (ref 6–20)
BUN: 37 mg/dL — AB (ref 6–20)
CALCIUM: 8 mg/dL — AB (ref 8.9–10.3)
CHLORIDE: 95 mmol/L — AB (ref 101–111)
CO2: 32 mmol/L (ref 22–32)
CO2: 30 mmol/L (ref 22–32)
Calcium: 8.1 mg/dL — ABNORMAL LOW (ref 8.9–10.3)
Chloride: 95 mmol/L — ABNORMAL LOW (ref 101–111)
Creatinine, Ser: 2.94 mg/dL — ABNORMAL HIGH (ref 0.61–1.24)
Creatinine, Ser: 2.67 mg/dL — ABNORMAL HIGH (ref 0.61–1.24)
GFR calc non Af Amer: 23 mL/min — ABNORMAL LOW (ref >60)
GFR, EST AFRICAN AMERICAN: 26 mL/min — AB (ref >60)
GFR, EST AFRICAN AMERICAN: 30 mL/min — AB (ref >60)
GFR, EST NON AFRICAN AMERICAN: 25 mL/min — AB (ref >60)
GLUCOSE: 152 mg/dL — AB (ref 65–99)
Glucose, Bld: 122 mg/dL — ABNORMAL HIGH (ref 65–99)
PHOSPHORUS: 4.9 mg/dL — AB (ref 2.5–4.6)
PHOSPHORUS: 4.1 mg/dL (ref 2.5–4.6)
POTASSIUM: 4 mmol/L (ref 3.5–5.1)
POTASSIUM: 3.9 mmol/L (ref 3.5–5.1)
Sodium: 137 mmol/L (ref 135–145)
Sodium: 134 mmol/L — ABNORMAL LOW (ref 135–145)

## 2016-04-05 LAB — BASIC METABOLIC PANEL
Anion gap: 8 (ref 5–15)
BUN: 46 mg/dL — AB (ref 6–20)
CHLORIDE: 97 mmol/L — AB (ref 101–111)
CO2: 32 mmol/L (ref 22–32)
Calcium: 7.8 mg/dL — ABNORMAL LOW (ref 8.9–10.3)
Creatinine, Ser: 3.07 mg/dL — ABNORMAL HIGH (ref 0.61–1.24)
GFR calc Af Amer: 25 mL/min — ABNORMAL LOW (ref >60)
GFR calc non Af Amer: 21 mL/min — ABNORMAL LOW (ref >60)
GLUCOSE: 142 mg/dL — AB (ref 65–99)
POTASSIUM: 3.8 mmol/L (ref 3.5–5.1)
Sodium: 137 mmol/L (ref 135–145)

## 2016-04-05 LAB — POCT ACTIVATED CLOTTING TIME
ACTIVATED CLOTTING TIME: 175 s
ACTIVATED CLOTTING TIME: 180 s
ACTIVATED CLOTTING TIME: 180 s
Activated Clotting Time: 175 seconds
Activated Clotting Time: 175 seconds
Activated Clotting Time: 175 seconds
Activated Clotting Time: 180 seconds
Activated Clotting Time: 180 seconds
Activated Clotting Time: 180 seconds

## 2016-04-05 LAB — APTT: aPTT: 43 seconds — ABNORMAL HIGH (ref 24–36)

## 2016-04-05 LAB — MAGNESIUM: Magnesium: 2.4 mg/dL (ref 1.7–2.4)

## 2016-04-05 MED ORDER — PRISMASOL BGK 4/2.5 32-4-2.5 MEQ/L IV SOLN
INTRAVENOUS | Status: DC
  Administered 2016-04-05 – 2016-04-07 (×3): via INTRAVENOUS_CENTRAL
  Filled 2016-04-05 (×3): qty 5000

## 2016-04-05 MED ORDER — HEPARIN SODIUM (PORCINE) 1000 UNIT/ML DIALYSIS: 1000.0000 [IU] | INTRAMUSCULAR | Status: DC | PRN

## 2016-04-05 NOTE — Progress Notes (Signed)
PULMONARY / CRITICAL CARE MEDICINE   Name: Jesse Hartman MRN: [id number] DOB: [birth year]    ADMISSION DATE:  03/26/2016 CONSULTATION DATE:  03/14/2016  REFERRING MD:  Dr. Vanita Panda  CHIEF COMPLAINT:  AMS  HISTORY OF PRESENT ILLNESS:   56 year old male with PMH of ETOH abuse (24 pack per day), COPD, Barrett's esophagus, HTN, Seizures, and OSA. He was started on lasix and potassium supplementation approximately 3/12. He was found down by his daughter 3/19 and brought to ED via EMS,hypotensive, with AKI & hyperkalemia , cr 4.2  While in CT he had episode of projectile vomiting of coffee ground emesis. Despite IVF resuscitation he remained hypotensive . 3/19 PM cardiac arrest 6 mins, intubated. On pressors.    STUDIES:  CT head 3/19 > No evidence of acute intracranial abnormality. Generalized cerebral volume loss. Mild paranasal sinusitis of uncertain acuity . CT abd/pelv 3/19 > Stranding around the pancreas compatible with acute pancreatitis. Fatty infiltration of the liver. Distended, fluid-filled stomach. EGD 3/19 > Severe esophagitis with no acute hemorrhage  CULTURES: Blood 3/19 >> 1 /2  S viridans Urine 3/19 > insignificant growth Resp 3/21 >> abundant yeast, PMNs >> reincubated >>  Few yeast Blood 3/21 >> ng Urine 3/21 >> negative  LINES/TUBES: CVL 3/19 > ETT 3/19 > L fem art line 3/21 >3/28   ANTIBIOTICS: vanco 3/19 >> 3/20 vanco 3/22 >> 3/23 Zosyn 3/19 >  SIGNIFICANT EVENTS: 3/19 admit, ARF 3/19 PM cardiac arrest 6 mins, intubated. On pressors.  3/23 Increased infiltrates over the last 24 hours on chest x-ray ? aspiration Changed to propofol on 3/24 3/25  significant cough that causes auto PEEP and subsequently hypotension 3/29 - Remains Critically ill, intubated & sedated. Continues to have asynchrony  on vent On high dose levo gtt.Good UO  3/30 - los 11\ days. On levophed 70mcg. fio2 70%, peep 5 and sedated. On bic gtt. Makes urine but rising creat. Per RN - was on  CRRT for a week and has been off few to several days  04/04/16 - overnight brief cpr. On CRRT . Nimbex +. On 2 pressors. fio2 60%. Daughter Adrea at bedsdie = says dad might not want to go trhough all this and thinking terminal wean. She is agreeable to DNR as first step but needs to talk to elder sister/daugghter    SUBJECTIVE/OVERNIGHT/INTERVAL HX  04/05/16  : made partial code yesterday but now daughters want to rescind that. Per RN - aunt told erh that daughter depend on patient $ for their drug abuse. Currently off pressors. Mucus plugging but doing ok on fio2 60%, peep 8. CRTT +. Anuric  VITAL SIGNS: BP 110/65   Pulse (!) 102   Temp 98.5 F (36.9 C) (Oral)   Resp (!) 35   Ht 6' (1.829 m)   Wt 133.9 kg (295 lb 3.1 oz)   SpO2 99%   BMI 40.04 kg/m   HEMODYNAMICS: CVP:  [16 mmHg] 16 mmHg  VENTILATOR SETTINGS: Vent Mode: PRVC FiO2 (%):  [50 %-70 %] 60 % Set Rate:  [35 bmp] 35 bmp Vt Set:  [500 mL] 500 mL PEEP:  [8 cmH20-10 cmH20] 8 cmH20 Plateau Pressure:  [25 cmH20-29 cmH20] 27 cmH20  INTAKE / OUTPUT: I/O last 3 completed shifts: In: 35 [I.V.:2992.3; NG/GT:1356.7; IV Piggyback:310] Out: 8646 [Urine:109; Other:8537]  PHYSICAL EXAMINATION:     General Appearance:    Looks criticall ill OBESE - ues  Head:    Normocephalic, without obvious abnormality, atraumatic  Eyes:  PERRL - yes, conjunctiva/corneas - clea5      Ears:    Normal external ear canals, both ears  Nose:   NG tube - no  Throat:  ETT TUBE - yes , OG tube - yes  Neck:   Supple,  No enlargement/tenderness/nodules     Lungs:     Clear to auscultation bilaterally, Ventilator   Synchrony - uyes  Chest wall:    No deformity  Heart:    S1 and S2 normal, no murmur, CVP - no.  Pressors - off  Abdomen:     Soft, no masses, no organomegaly  Genitalia:    Not done  Rectal:   not done  Extremities:   Extremities- edema mild     Skin:   Intact in exposed areas . Sacral area - no decub reproted      Neurologic:   Sedation - fent versed nimbex -> RASS - -5, BIS 40s        LABS: PULMONARY  Recent Labs Lab 03/30/16 1215 03/31/16 0405 04/01/16 1212  04/03/16 0110 04/03/16 1256 04/03/16 1445 04/03/16 2305 04/04/16 0255  PHART 7.411 7.404 7.309*  < > 7.242* 7.170* 7.117* 7.118* 7.366  PCO2ART 62.0* 52.8* 64.9*  < > 83.4* 108* ABOVE REPORTABLE RANGE ABOVE REPORTABLE RANGE 64.0*  PO2ART 56.0* 52.0* 62.0*  < > 160* 88.8 81.2* 88.0 238*  HCO3 39.4* 33.0* 32.8*  < > 34.6* 37.9* 37.7* 39.8* 35.7*  TCO2 41 35 35  --   --   --   --   --   --   O2SAT 88.0 86.0 89.0  < > 99.0 94.8 92.6 95.4 99.6  < > = values in this interval not displayed.  CBC  Recent Labs Lab 04/03/16 1113 04/04/16 0350 04/05/16 0435  HGB 7.2* 7.0* 7.3*  HCT 23.6* 23.3* 22.9*  WBC 8.8 10.2 9.4  PLT 224 243 264    COAGULATION  Recent Labs Lab 04/02/16 1412  INR 1.08    CARDIAC  No results for input(s): TROPONINI in the last 168 hours. No results for input(s): PROBNP in the last 168 hours.   CHEMISTRY  Recent Labs Lab 03/30/16 0350  04/02/16 0445 04/03/16 0434 04/04/16 0000 04/04/16 0350 04/04/16 0351 04/04/16 1643 04/05/16 0000 04/05/16 0435  NA 152*  < > 147* 148* 144  --  143 138 137 137  K 3.4*  < > 3.7 3.8 5.0  --  4.1 4.8 3.8 4.0  CL 108  < > 106 100* 94*  --  95* 98* 97* 95*  CO2 34*  < > 33* 38* 37*  --  35* 33* 32 32  GLUCOSE 198*  < > 151* 124* 150*  --  118* 140* 142* 152*  BUN 47*  < > 47* 57* 57*  --  55* 49* 46* 41*  CREATININE 1.76*  < > 2.05* 3.36* 3.84*  --  3.50* 3.11* 3.07* 2.94*  CALCIUM 8.5*  < > 8.5* 7.7* 7.0*  --  7.0* 7.4* 7.8* 8.0*  MG 2.0  --  2.1 2.2  --  2.2  --   --   --  2.4  PHOS  --   < > 6.4* 8.5*  --  6.1* 6.2* 5.0*  --  4.9*  4.9*  < > = values in this interval not displayed. Estimated Creatinine Clearance: 40.7 mL/min (A) (by C-G formula based on SCr of 2.94 mg/dL (H)).   LIVER  Recent Labs Lab 03/31/16 0449 04/02/16 1412 04/04/16  8882  04/04/16 1643 04/05/16 0435  AST 100*  --   --   --   --   ALT 93*  --   --   --   --   ALKPHOS 295*  --   --   --   --   BILITOT 2.8*  --   --   --   --   PROT 6.0*  --   --   --   --   ALBUMIN 1.5*  --  1.2* 1.4* 1.4*  INR  --  1.08  --   --   --      INFECTIOUS No results for input(s): LATICACIDVEN, PROCALCITON in the last 168 hours.   ENDOCRINE CBG (last 3)   Recent Labs  04/05/16 0417 04/05/16 0852 04/05/16 1237  GLUCAP 146* 126* 142*         IMAGING x48h  - image(s) personally visualized  -   highlighted in bold Dg Chest Port 1 View  Result Date: 04/05/2016 CLINICAL DATA:  Intubated.  Followup bilateral airspace disease. EXAM: PORTABLE CHEST 1 VIEW COMPARISON:  Yesterday. FINDINGS: Endotracheal tube in satisfactory position. Feeding tube extending into the stomach. The left jugular catheter tip remains projected over the medial left lung apex. Normal sized heart. Mildly improved diffuse bilateral airspace opacity. No pleural fluid. Thoracic spine degenerative changes and mild right shoulder degenerative changes. IMPRESSION: 1. Mildly improved diffuse bilateral pneumonia, alveolar edema or ARDS. 2. The left jugular catheter tip remains projected over the medial left lung apex, most likely in the left innominate vein. Electronically Signed   By: [name] M.D.   On: 04/05/2016 07:14   Dg Chest Port 1 View  Result Date: 04/04/2016 CLINICAL DATA:  Intubated. EXAM: PORTABLE CHEST 1 VIEW COMPARISON:  Yesterday. FINDINGS: Endotracheal tube in satisfactory position. Feeding tube extending into the stomach. Left jugular catheter tip overlying the medial left lung apex without significant change. The cardiac silhouette remains borderline enlarged. No significant change in patchy airspace opacity throughout both lungs. No pleural fluid. Thoracic spine degenerative changes. IMPRESSION: 1. The left jugular catheter tip remains overlying the medial left lung apex. This is most  likely in the left innominate vein. 2. Stable bilateral alveolar edema, pneumonia or ARDS. Electronically Signed   By: [name] M.D.   On: 04/04/2016 07:28   Dg Chest Port 1 View  Result Date: 04/03/2016 CLINICAL DATA:  Initial evaluation for ventilator status. EXAM: PORTABLE CHEST 1 VIEW COMPARISON:  Prior radiograph from earlier the same day. FINDINGS: Endotracheal tube in place with tip positioned well above the carina. Enteric tube courses in the the abdomen. Left IJ approach centra venous catheter is stable with tip overlying the left upper lobe. Defibrillator pads in place. Stable cardiomegaly. Mediastinal silhouette within normal limits. Multifocal parenchymal and interstitial opacities again seen involving the lungs bilaterally, with greatest involvement within the right upper and left lower lobes. Overall opacities are slightly improved from previous. No pleural effusion. No pneumothorax. No acute osseous abnormality. IMPRESSION: 1. Slight interval improvement in diffuse multifocal interstitial and airspace opacities as compared to radiograph performed earlier the same day. 2. Stable support apparatus. Electronically Signed   By: Jeannine Boga M.D.   On: 04/03/2016 23:02   Dg Chest Port 1 View  Result Date: 04/03/2016 CLINICAL DATA:  Followup pulmonary status. EXAM: PORTABLE CHEST 1 VIEW COMPARISON:  Earlier film, same date. FINDINGS: The endotracheal tube is 6.9 cm above the carina. There is a feeding tube coursing  down the esophagus and into the stomach. A left IJ catheter tip is in the region of the left brachiocephalic vein. Persistent diffuse interstitial and airspace process. No pneumothorax or pleural effusion. IMPRESSION: Persistent diffuse interstitial and airspace process but no large pleural effusion or pneumothorax. Stable support apparatus. Electronically Signed   By: Marijo Sanes M.D.   On: 04/03/2016 15:34       DISCUSSION: 56 year old male admitted with AMS and  AKI. Was improving but suffered massive coffee ground emesis and subsequent brief cardiac arrest. Developed ARDS & shock. Mental status intact but hypoxia very slow to improve  ASSESSMENT / PLAN:  PULMONARY A: Acute hypoxemic respiratory failure: suspect he aspirated on emesis, CXR and exam also concerning ALI vs edema. COPD without acute exacerbation. OSA. BL PNA -favor Aspiration  - 4/1 ARDS with 60% fio2, peep 8 and CXR some better  P:   PCV vent Scheduled bronchodilators Nimbex from 04/04/16 - plan 48 to 72h Plan for Tstomy when eligible - if family concurs  CARDIOVASCULAR A:  Shock: hemorrhagic, +/- septic  H/o HTN, CHF.   -septic shock resolved  04/05/16  P:  Dc pressors from MAR MAP goal > 65 Goal CVP 10-12 range  RENAL A:   Acute renal failure - presumed ATN in setting dehydration/ARB/NSAIDs/shock. STrated CRRT 04/03/16  4/1/1 - on CRRT. ANuric  P:   Per renal  GASTROINTESTINAL A:   GIB - severe esophagitis on EGD. No acute bleed so no interventions performed. Acute pancreatitis - per CT but lipase / amylase normal, suspect more chronic . H/o Barrett's esophagus. Constipation   - on TF P:   tube feeding Hold off on GI consultation as his hemoglobin has stabilized but recall at evidence of GIB Protonix twice a day. No BM since admit - repeatDulcolax suppository &  lactulose  HEMATOLOGIC A:   ABLA secondary to GIB, some continued blood loss, no hematemesis, no melena yet   - anemia of critical illness P:  Follow CBC Transfusion goal Hgb > 7  INFECTIOUS A:   Aspiration PNA, LLL Yeast resp cx, unclear significance  S. Viridans 1 of 2 blood cx, repeat negative so far   -off pressors 04/05/16    P:   Continue Zosyn until 3/29, then ancef to complete 14 ds if resp improvement Vancomycin stopped on 3/23  ENDOCRINE A:   Hyperglycemia. P:   SSI  NEUROLOGIC A:   Acute metabolic encephalopathy: multifactorial - withdrawal, uremia, hepatic,  sedation. At risk ETOH/benzo withdrawal.  -04/05/16  -deeply sedated and paraluyzed  P   Fent gtt and versed gtt Nimbex gtt for 48-72h Off  Klonopin Off  diprivan Off  deroquel  FAMILY  - Updates:  updated daughter  3/29 - agreeable to trach, outlined guarded prognosis for full recovery from ARDS.   - Inter-disciplinary family meet or Palliative Care meeting due by: 3/25 - dopne 3/30 and again 04/04/16 daughter and family agreed to DNR. Then on 04/05/16 - spoke to elder daughter Tillie Rung - says yesterday that patient mom from [facility] and his sister an  RN [facility] Kidney do not care about patient an forced the kids into DNR. Now, they want to wait for maternal aunt from [place]) and want to rescind DNR - they want CPR atleast till 04/06/16 . Says  She is not "stupid" and knows reality.     .  Rest per NP/medical resident whose note is outlined above and that I agree with  The patient is  critically ill with multiple organ systems failure and requires high complexity decision making for assessment and support, frequent evaluation and titration of therapies, application of advanced monitoring technologies and extensive interpretation of multiple databases.   Critical Care Time devoted to patient care services described in this note is  40  Minutes. This time reflects time of care of this signee Dr Brand Males. This critical care time does not reflect procedure time, or teaching time or supervisory time of PA/NP/Med student/Med Resident etc but could involve care discussion time    Dr. Brand Males, M.D., [facility].C.P Pulmonary and Critical Care Medicine Staff Physician [facility] Pager: [phone], If no answer or between  15:00h - 7:00h: call [phone]  04/05/2016 3:23 PM

## 2016-04-05 NOTE — Progress Notes (Signed)
1. Oliguric AKI-CRRT began on 3/30---cont. CRRT for now, adjust fluids and cont neg balance 2. Sepsis/VDRF 3. COPD/OSA  Subjective: Interval History: Limited code status, no CPR or defibrillation  Objective: Vital signs in last 24 hours: Temp:  [98.1 F (36.7 C)-99.1 F (37.3 C)] 98.5 F (36.9 C) (04/01 1235) Pulse Rate:  [89-107] 102 (04/01 1315) Resp:  [35] 35 (04/01 1315) BP: (91-148)/(51-83) 106/53 (04/01 1300) SpO2:  [93 %-100 %] 99 % (04/01 1315) Arterial Line BP: (79-178)/(46-75) 106/51 (04/01 1315) FiO2 (%):  [50 %-70 %] 60 % (04/01 1200) Weight:  [133.9 kg (295 lb 3.1 oz)] 133.9 kg (295 lb 3.1 oz) (04/01 0419) Weight change: -5.3 kg (-11 lb 11 oz)  Intake/Output from previous day: 03/31 0701 - 04/01 0700 In: 2588.5 [I.V.:1598.5; NG/GT:880; IV Piggyback:110] Out: 5961 [Urine:70] Intake/Output this shift: Total I/O In: 623.7 [I.V.:333.7; NG/GT:240; IV Piggyback:50] Out: 1189 [Urine:10; DGUYQ:0347]  General appearance: sedated  Lungs clear ant Cor RRR anasarca  Lab Results:  Recent Labs  04/04/16 0350 04/05/16 0435  WBC 10.2 9.4  HGB 7.0* 7.3*  HCT 23.3* 22.9*  PLT 243 264   BMET:  Recent Labs  04/05/16 0000 04/05/16 0435  NA 137 137  K 3.8 4.0  CL 97* 95*  CO2 32 32  GLUCOSE 142* 152*  BUN 46* 41*  CREATININE 3.07* 2.94*  CALCIUM 7.8* 8.0*   No results for input(s): PTH in the last 72 hours. Iron Studies: No results for input(s): IRON, TIBC, TRANSFERRIN, FERRITIN in the last 72 hours. Studies/Results: Dg Chest Port 1 View  Result Date: 04/05/2016 CLINICAL DATA:  Intubated.  Followup bilateral airspace disease. EXAM: PORTABLE CHEST 1 VIEW COMPARISON:  Yesterday. FINDINGS: Endotracheal tube in satisfactory position. Feeding tube extending into the stomach. The left jugular catheter tip remains projected over the medial left lung apex. Normal sized heart. Mildly improved diffuse bilateral airspace opacity. No pleural fluid. Thoracic spine  degenerative changes and mild right shoulder degenerative changes. IMPRESSION: 1. Mildly improved diffuse bilateral pneumonia, alveolar edema or ARDS. 2. The left jugular catheter tip remains projected over the medial left lung apex, most likely in the left innominate vein. Electronically Signed   By: [name] M.D.   On: 04/05/2016 07:14   Dg Chest Port 1 View  Result Date: 04/04/2016 CLINICAL DATA:  Intubated. EXAM: PORTABLE CHEST 1 VIEW COMPARISON:  Yesterday. FINDINGS: Endotracheal tube in satisfactory position. Feeding tube extending into the stomach. Left jugular catheter tip overlying the medial left lung apex without significant change. The cardiac silhouette remains borderline enlarged. No significant change in patchy airspace opacity throughout both lungs. No pleural fluid. Thoracic spine degenerative changes. IMPRESSION: 1. The left jugular catheter tip remains overlying the medial left lung apex. This is most likely in the left innominate vein. 2. Stable bilateral alveolar edema, pneumonia or ARDS. Electronically Signed   By: [name] M.D.   On: 04/04/2016 07:28   Dg Chest Port 1 View  Result Date: 04/03/2016 CLINICAL DATA:  Initial evaluation for ventilator status. EXAM: PORTABLE CHEST 1 VIEW COMPARISON:  Prior radiograph from earlier the same day. FINDINGS: Endotracheal tube in place with tip positioned well above the carina. Enteric tube courses in the the abdomen. Left IJ approach centra venous catheter is stable with tip overlying the left upper lobe. Defibrillator pads in place. Stable cardiomegaly. Mediastinal silhouette within normal limits. Multifocal parenchymal and interstitial opacities again seen involving the lungs bilaterally, with greatest involvement within the right upper and left lower lobes.  Overall opacities are slightly improved from previous. No pleural effusion. No pneumothorax. No acute osseous abnormality. IMPRESSION: 1. Slight interval improvement in diffuse  multifocal interstitial and airspace opacities as compared to radiograph performed earlier the same day. 2. Stable support apparatus. Electronically Signed   By: Jeannine Boga M.D.   On: 04/03/2016 23:02   Dg Chest Port 1 View  Result Date: 04/03/2016 CLINICAL DATA:  Followup pulmonary status. EXAM: PORTABLE CHEST 1 VIEW COMPARISON:  Earlier film, same date. FINDINGS: The endotracheal tube is 6.9 cm above the carina. There is a feeding tube coursing down the esophagus and into the stomach. A left IJ catheter tip is in the region of the left brachiocephalic vein. Persistent diffuse interstitial and airspace process. No pneumothorax or pleural effusion. IMPRESSION: Persistent diffuse interstitial and airspace process but no large pleural effusion or pneumothorax. Stable support apparatus. Electronically Signed   By: Marijo Sanes M.D.   On: 04/03/2016 15:34   Scheduled: . artificial tears  1 application Both Eyes H0W  .  ceFAZolin (ANCEF) IV  2 g Intravenous Q12H  . chlorhexidine gluconate (MEDLINE KIT)  15 mL Mouth Rinse BID  . Chlorhexidine Gluconate Cloth  6 each Topical Daily  . feeding supplement (VITAL HIGH PROTEIN)  1,000 mL Per Tube Q24H  . folic acid  1 mg Per Tube Daily  . insulin aspart  0-15 Units Subcutaneous Q4H  . ipratropium-albuterol  3 mL Nebulization Q6H  . mouth rinse  15 mL Mouth Rinse QID  . pantoprazole sodium  40 mg Per Tube Daily  . sodium chloride flush  10-40 mL Intracatheter Q12H  . thiamine  100 mg Per Tube Daily     LOS: 13 days   Bobbie Valletta C 04/05/2016,1:39 PM

## 2016-04-05 DEATH — deceased

## 2016-04-06 ENCOUNTER — Inpatient Hospital Stay (HOSPITAL_COMMUNITY): Payer: Medicare Other

## 2016-04-06 DIAGNOSIS — Z9911 Dependence on respirator [ventilator] status: Secondary | ICD-10-CM

## 2016-04-06 DIAGNOSIS — K7290 Hepatic failure, unspecified without coma: Secondary | ICD-10-CM

## 2016-04-06 DIAGNOSIS — K7682 Hepatic encephalopathy: Secondary | ICD-10-CM

## 2016-04-06 DIAGNOSIS — N19 Unspecified kidney failure: Secondary | ICD-10-CM

## 2016-04-06 DIAGNOSIS — K859 Acute pancreatitis without necrosis or infection, unspecified: Secondary | ICD-10-CM

## 2016-04-06 LAB — GLUCOSE, CAPILLARY
GLUCOSE-CAPILLARY: 127 mg/dL — AB (ref 65–99)
GLUCOSE-CAPILLARY: 132 mg/dL — AB (ref 65–99)
Glucose-Capillary: 121 mg/dL — ABNORMAL HIGH (ref 65–99)
Glucose-Capillary: 124 mg/dL — ABNORMAL HIGH (ref 65–99)
Glucose-Capillary: 126 mg/dL — ABNORMAL HIGH (ref 65–99)
Glucose-Capillary: 129 mg/dL — ABNORMAL HIGH (ref 65–99)

## 2016-04-06 LAB — RENAL FUNCTION PANEL
ALBUMIN: 1.5 g/dL — AB (ref 3.5–5.0)
ANION GAP: 8 (ref 5–15)
Albumin: 1.4 g/dL — ABNORMAL LOW (ref 3.5–5.0)
Anion gap: 7 (ref 5–15)
BUN: 38 mg/dL — ABNORMAL HIGH (ref 6–20)
BUN: 35 mg/dL — AB (ref 6–20)
CALCIUM: 8.4 mg/dL — AB (ref 8.9–10.3)
CALCIUM: 8.5 mg/dL — AB (ref 8.9–10.3)
CO2: 31 mmol/L (ref 22–32)
CO2: 30 mmol/L (ref 22–32)
CREATININE: 2.69 mg/dL — AB (ref 0.61–1.24)
Chloride: 98 mmol/L — ABNORMAL LOW (ref 101–111)
Chloride: 98 mmol/L — ABNORMAL LOW (ref 101–111)
Creatinine, Ser: 2.73 mg/dL — ABNORMAL HIGH (ref 0.61–1.24)
GFR calc Af Amer: 29 mL/min — ABNORMAL LOW (ref >60)
GFR calc non Af Amer: 25 mL/min — ABNORMAL LOW (ref >60)
GFR calc non Af Amer: 25 mL/min — ABNORMAL LOW (ref >60)
GFR, EST AFRICAN AMERICAN: 29 mL/min — AB (ref >60)
GLUCOSE: 129 mg/dL — AB (ref 65–99)
Glucose, Bld: 139 mg/dL — ABNORMAL HIGH (ref 65–99)
PHOSPHORUS: 4.4 mg/dL (ref 2.5–4.6)
PHOSPHORUS: 4.3 mg/dL (ref 2.5–4.6)
POTASSIUM: 4.5 mmol/L (ref 3.5–5.1)
Potassium: 5 mmol/L (ref 3.5–5.1)
SODIUM: 137 mmol/L (ref 135–145)
SODIUM: 135 mmol/L (ref 135–145)

## 2016-04-06 LAB — POCT I-STAT 3, ART BLOOD GAS (G3+)
Acid-Base Excess: 2 mmol/L (ref 0.0–2.0)
BICARBONATE: 29.1 mmol/L — AB (ref 20.0–28.0)
O2 Saturation: 90 %
TCO2: 31 mmol/L (ref 0–100)
pCO2 arterial: 59.4 mmHg — ABNORMAL HIGH (ref 32.0–48.0)
pH, Arterial: 7.298 — ABNORMAL LOW (ref 7.350–7.450)
pO2, Arterial: 65 mmHg — ABNORMAL LOW (ref 83.0–108.0)

## 2016-04-06 LAB — CBC WITH DIFFERENTIAL/PLATELET
Basophils Absolute: 0 10*3/uL (ref 0.0–0.1)
Basophils Relative: 0 %
EOS ABS: 0.2 10*3/uL (ref 0.0–0.7)
Eosinophils Relative: 3 %
HCT: 23.3 % — ABNORMAL LOW (ref 39.0–52.0)
HEMOGLOBIN: 7.2 g/dL — AB (ref 13.0–17.0)
LYMPHS ABS: 0.7 10*3/uL (ref 0.7–4.0)
LYMPHS PCT: 9 %
MCH: 29.8 pg (ref 26.0–34.0)
MCHC: 30.9 g/dL (ref 30.0–36.0)
MCV: 96.3 fL (ref 78.0–100.0)
MONOS PCT: 7 %
Monocytes Absolute: 0.5 10*3/uL (ref 0.1–1.0)
NEUTROS ABS: 6.6 10*3/uL (ref 1.7–7.7)
NEUTROS PCT: 81 %
Platelets: 248 10*3/uL (ref 150–400)
RBC: 2.42 MIL/uL — AB (ref 4.22–5.81)
RDW: 18.4 % — ABNORMAL HIGH (ref 11.5–15.5)
WBC: 8.1 10*3/uL (ref 4.0–10.5)

## 2016-04-06 LAB — POCT ACTIVATED CLOTTING TIME
ACTIVATED CLOTTING TIME: 158 s
ACTIVATED CLOTTING TIME: 175 s
ACTIVATED CLOTTING TIME: 125 s
Activated Clotting Time: 142 seconds
Activated Clotting Time: 164 seconds
Activated Clotting Time: 169 seconds
Activated Clotting Time: 164 seconds
Activated Clotting Time: 158 seconds

## 2016-04-06 LAB — APTT: aPTT: 44 seconds — ABNORMAL HIGH (ref 24–36)

## 2016-04-06 LAB — MAGNESIUM: MAGNESIUM: 2.5 mg/dL — AB (ref 1.7–2.4)

## 2016-04-06 MED ORDER — POLYETHYLENE GLYCOL 3350 17 G PO PACK
17.0000 g | PACK | Freq: Every day | ORAL | Status: DC
  Administered 2016-04-06 – 2016-04-08 (×3): 17 g via ORAL
  Filled 2016-04-06 (×4): qty 1

## 2016-04-06 MED ORDER — ACETYLCYSTEINE 20 % IN SOLN: 4.0000 mL | Freq: Three times a day (TID) | RESPIRATORY_TRACT | Status: DC

## 2016-04-06 MED ORDER — ACETYLCYSTEINE 20 % IN SOLN
4.0000 mL | Freq: Three times a day (TID) | RESPIRATORY_TRACT | Status: DC
  Administered 2016-04-06 – 2016-04-08 (×7): 4 mL via RESPIRATORY_TRACT
  Filled 2016-04-06 (×10): qty 4

## 2016-04-06 MED ORDER — GUAIFENESIN 100 MG/5ML PO SOLN
10.0000 mL | Freq: Three times a day (TID) | ORAL | Status: DC
  Administered 2016-04-06 – 2016-04-08 (×6): 200 mg
  Filled 2016-04-06 (×9): qty 10

## 2016-04-06 MED ORDER — FENTANYL CITRATE (PF) 100 MCG/2ML IJ SOLN: 50.0000 ug | Freq: Once | INTRAMUSCULAR | Status: DC

## 2016-04-06 MED ORDER — HEPARIN BOLUS VIA INFUSION (CRRT)
5000.0000 [IU] | Freq: Once | INTRAVENOUS | Status: AC
  Administered 2016-04-06: 5000 [IU] via INTRAVENOUS_CENTRAL
  Filled 2016-04-06: qty 5000

## 2016-04-06 MED ORDER — FENTANYL 2500MCG IN NS 250ML (10MCG/ML) PREMIX INFUSION
25.0000 ug/h | INTRAVENOUS | Status: DC
  Administered 2016-04-06: 400 ug/h via INTRAVENOUS
  Administered 2016-04-07: 350 ug/h via INTRAVENOUS
  Administered 2016-04-07: 175 ug/h via INTRAVENOUS
  Administered 2016-04-07: 350 ug/h via INTRAVENOUS
  Administered 2016-04-08: 75 ug/h via INTRAVENOUS
  Administered 2016-04-09: 200 ug/h via INTRAVENOUS
  Filled 2016-04-06 (×7): qty 250

## 2016-04-06 MED ORDER — SODIUM CHLORIDE 0.9 % IV SOLN
0.0000 mg/h | INTRAVENOUS | Status: DC
  Administered 2016-04-06: 8 mg/h via INTRAVENOUS
  Administered 2016-04-07: 2 mg/h via INTRAVENOUS
  Administered 2016-04-07: 6 mg/h via INTRAVENOUS
  Administered 2016-04-08: 2 mg/h via INTRAVENOUS
  Administered 2016-04-09: 4 mg/h via INTRAVENOUS
  Filled 2016-04-06 (×5): qty 10

## 2016-04-06 MED ORDER — FENTANYL BOLUS VIA INFUSION
50.0000 ug | INTRAVENOUS | Status: DC | PRN
  Administered 2016-04-09: 50 ug via INTRAVENOUS
  Filled 2016-04-06: qty 50

## 2016-04-06 MED ORDER — SENNA 8.6 MG PO TABS
8.6000 mg | ORAL_TABLET | Freq: Two times a day (BID) | ORAL | Status: DC
  Administered 2016-04-06 – 2016-04-08 (×5): 8.6 mg
  Filled 2016-04-06 (×7): qty 1

## 2016-04-06 MED ORDER — WHITE PETROLATUM GEL
Status: AC
  Administered 2016-04-06: 16:00:00
  Filled 2016-04-06: qty 1

## 2016-04-06 MED ORDER — MIDAZOLAM BOLUS VIA INFUSION
1.0000 mg | INTRAVENOUS | Status: DC | PRN
  Administered 2016-04-09: 2 mg via INTRAVENOUS
  Filled 2016-04-06: qty 2

## 2016-04-06 NOTE — Progress Notes (Signed)
Subjective: Interval History: sedated on vent.  Objective: Vital signs in last 24 hours: Temp:  [98 F (36.7 C)-99.4 F (37.4 C)] 99.4 F (37.4 C) (04/02 0352) Pulse Rate:  [89-111] 105 (04/02 0700) Resp:  [0-40] 35 (04/02 0700) BP: (101-138)/(50-72) 118/60 (04/02 0700) SpO2:  [92 %-100 %] 94 % (04/02 0700) Arterial Line BP: (88-139)/(46-60) 126/55 (04/01 1430) FiO2 (%):  [45 %-70 %] 45 % (04/02 0600) Weight:  [129.6 kg (285 lb 11.5 oz)] 129.6 kg (285 lb 11.5 oz) (04/02 0400) Weight change: -4.3 kg (-9 lb 7.7 oz)  Intake/Output from previous day: 04/01 0701 - 04/02 0700 In: 2591.1 [I.V.:1501.1; NG/GT:940; IV Piggyback:150] Out: 6780 [Urine:75] Intake/Output this shift: No intake/output data recorded.  General appearance: on vent not responsive Resp: rales bilaterally Cardio: S1, S2 normal and systolic murmur: holosystolic 2/6, blowing at apex GI: obese, pos bs, liver down 6 cm Extremities: edema 4 + edema, lesions on arm  Lab Results:  Recent Labs  04/05/16 0435 04/06/16 0426  WBC 9.4 8.1  HGB 7.3* 7.2*  HCT 22.9* 23.3*  PLT 264 248   BMET:  Recent Labs  04/05/16 1830 04/06/16 0426  NA 134* 137  K 3.9 4.5  CL 95* 98*  CO2 30 31  GLUCOSE 122* 139*  BUN 37* 38*  CREATININE 2.67* 2.73*  CALCIUM 8.1* 8.4*   No results for input(s): PTH in the last 72 hours. Iron Studies: No results for input(s): IRON, TIBC, TRANSFERRIN, FERRITIN in the last 72 hours.  Studies/Results: Dg Chest Port 1 View  Result Date: 04/05/2016 CLINICAL DATA:  Intubated.  Followup bilateral airspace disease. EXAM: PORTABLE CHEST 1 VIEW COMPARISON:  Yesterday. FINDINGS: Endotracheal tube in satisfactory position. Feeding tube extending into the stomach. The left jugular catheter tip remains projected over the medial left lung apex. Normal sized heart. Mildly improved diffuse bilateral airspace opacity. No pleural fluid. Thoracic spine degenerative changes and mild right shoulder degenerative  changes. IMPRESSION: 1. Mildly improved diffuse bilateral pneumonia, alveolar edema or ARDS. 2. The left jugular catheter tip remains projected over the medial left lung apex, most likely in the left innominate vein. Electronically Signed   By: [name] M.D.   On: 04/05/2016 07:14    I have reviewed the patient's current medications.  Assessment/Plan: 1 AKI oliguric ATN, ichemic.  Pneu with asp , , GIB.    On CRRT good clearance, solute, acid/base/K. Vol xs 2 COPD 3 Resp failure  Still vol xs and ?pneu 4 Anemia stable  5 ETOH abuse 6 Nutrition P CRRT, lower vol, solute, cont AB, Vent,     LOS: 14 days   Jesse Hartman 04/06/2016,7:11 AM

## 2016-04-06 NOTE — Progress Notes (Addendum)
RTs called to the bedside for desat. RT notified pt desated while turning pt. When RT arrived pt VT decreased - RT placed pt on FIO2 100% - sxn moderate thick tan yellow. Dr Nelda Marseille aware. Pt VT returned and sats became stable.

## 2016-04-06 NOTE — Progress Notes (Addendum)
Increased RR 18 per Dr Nelda Marseille due to abg results. No further vent orders at this time. ETT holder changed with no complications. Protective barrier placed on pts right cheek and upper lip. Dr Nelda Marseille aware

## 2016-04-06 NOTE — Progress Notes (Signed)
PULMONARY / CRITICAL CARE MEDICINE   Name: Jesse Hartman MRN: [id number] DOB: 11/20/1960    ADMISSION DATE:  03/29/2016 CONSULTATION DATE:  03/24/2016  REFERRING MD:  Dr. Vanita Panda  CHIEF COMPLAINT:  AMS  HISTORY OF PRESENT ILLNESS:   56 year old male with PMH of ETOH abuse (24 pack per day), COPD, Barrett's esophagus, HTN, Seizures, and OSA. He was started on lasix and potassium supplementation approximately 3/12. He was found down by his daughter 3/19 and brought to ED via EMS,hypotensive, with AKI & hyperkalemia , cr 4.2  While in CT he had episode of projectile vomiting of coffee ground emesis. Despite IVF resuscitation he remained hypotensive . 3/19 PM cardiac arrest 6 mins, intubated. On pressors.    STUDIES:  CT head 3/19 > No evidence of acute intracranial abnormality. Generalized cerebral volume loss. Mild paranasal sinusitis of uncertain acuity . CT abd/pelv 3/19 > Stranding around the pancreas compatible with acute pancreatitis. Fatty infiltration of the liver. Distended, fluid-filled stomach. EGD 3/19 > Severe esophagitis with no acute hemorrhage  CULTURES: Blood 3/19 >> 1 /2  S viridans Urine 3/19 > insignificant growth Resp 3/21 >> abundant yeast, PMNs >> reincubated >>  Few yeast Blood 3/21 >> ng Urine 3/21 >> negative  LINES/TUBES: CVL 3/19 > ETT 3/19 > L fem art line 3/21 >3/28   ANTIBIOTICS: vanco 3/19 >> 3/20 vanco 3/22 >> 3/23 Zosyn 3/19 >  SIGNIFICANT EVENTS: 3/19 admit, ARF 3/19 PM cardiac arrest 6 mins, intubated. On pressors.  3/23 Increased infiltrates over the last 24 hours on chest x-ray ? aspiration Changed to propofol on 3/24 3/25  significant cough that causes auto PEEP and subsequently hypotension 3/29 - Remains Critically ill, intubated & sedated. Continues to have asynchrony  on vent On high dose levo gtt.Good UO  3/30 - los 11\ days. On levophed 60mcg. fio2 70%, peep 5 and sedated. On bic gtt. Makes urine but rising creat. Per RN - was on  CRRT for a week and has been off few to several days  04/04/16 - overnight brief cpr. On CRRT . Nimbex +. On 2 pressors. fio2 60%. Daughter Adrea at bedsdie = says dad might not want to go trhough all this and thinking terminal wean. She is agreeable to DNR as first step but needs to talk to elder sister/daugghter    SUBJECTIVE/OVERNIGHT/INTERVAL HX NAD, sedated on vent.    VITAL SIGNS: BP 118/60   Pulse (!) 105   Temp 99.4 F (37.4 C) (Oral)   Resp (!) 35   Ht 6' (1.829 m)   Wt 285 lb 11.5 oz (129.6 kg)   SpO2 97%   BMI 38.75 kg/m   HEMODYNAMICS: CVP:  [15 mmHg] 15 mmHg  VENTILATOR SETTINGS: Vent Mode: PRVC FiO2 (%):  [45 %-70 %] 45 % Set Rate:  [35 bmp] 35 bmp Vt Set:  [500 mL] 500 mL PEEP:  [8 cmH20] 8 cmH20 Plateau Pressure:  [26 cmH20-28 cmH20] 26 cmH20  INTAKE / OUTPUT: I/O last 3 completed shifts: In: 4078.2 [I.V.:2408.2; NG/GT:1420; IV Piggyback:250] Out: 4580 [Urine:140; DXIPJ:8250]  PHYSICAL EXAMINATION:     General Appearance:    Looks criticall ill, sedated  Head:    Normocephalic, without obvious abnormality, atraumatic     Ears:    Normal external ear canals, both ears     Throat:  ETT TUBE - yes , OG tube - yes  Neck:   Supple,  No enlargement/tenderness/nodules     Lungs:  Clear to auscultation anterior auscultation bilaterally, Ventilator   Synchrony - yes  Chest wall:    No deformity  Heart:    S1 and S2 normal, no murmur  Abdomen:     Soft, no masses, no organomegaly        Extremities:   Extremities- edema mild     Skin:   Intact in exposed areas . Sacral area - no decub reproted             LABS: PULMONARY  Recent Labs Lab 03/30/16 1215 03/31/16 0405 04/01/16 1212  04/03/16 0110 04/03/16 1256 04/03/16 1445 04/03/16 2305 04/04/16 0255  PHART 7.411 7.404 7.309*  < > 7.242* 7.170* 7.117* 7.118* 7.366  PCO2ART 62.0* 52.8* 64.9*  < > 83.4* 108* ABOVE REPORTABLE RANGE ABOVE REPORTABLE RANGE 64.0*  PO2ART 56.0* 52.0*  62.0*  < > 160* 88.8 81.2* 88.0 238*  HCO3 39.4* 33.0* 32.8*  < > 34.6* 37.9* 37.7* 39.8* 35.7*  TCO2 41 35 35  --   --   --   --   --   --   O2SAT 88.0 86.0 89.0  < > 99.0 94.8 92.6 95.4 99.6  < > = values in this interval not displayed.  CBC  Recent Labs Lab 04/04/16 0350 04/05/16 0435 04/06/16 0426  HGB 7.0* 7.3* 7.2*  HCT 23.3* 22.9* 23.3*  WBC 10.2 9.4 8.1  PLT 243 264 248    COAGULATION  Recent Labs Lab 04/02/16 1412  INR 1.08     CHEMISTRY  Recent Labs Lab 04/02/16 0445 04/03/16 0434  04/04/16 0350 04/04/16 0351 04/04/16 1643 04/05/16 0000 04/05/16 0435 04/05/16 1830 04/06/16 0426  NA 147* 148*  < >  --  143 138 137 137 134* 137  K 3.7 3.8  < >  --  4.1 4.8 3.8 4.0 3.9 4.5  CL 106 100*  < >  --  95* 98* 97* 95* 95* 98*  CO2 33* 38*  < >  --  35* 33* 32 32 30 31  GLUCOSE 151* 124*  < >  --  118* 140* 142* 152* 122* 139*  BUN 47* 57*  < >  --  55* 49* 46* 41* 37* 38*  CREATININE 2.05* 3.36*  < >  --  3.50* 3.11* 3.07* 2.94* 2.67* 2.73*  CALCIUM 8.5* 7.7*  < >  --  7.0* 7.4* 7.8* 8.0* 8.1* 8.4*  MG 2.1 2.2  --  2.2  --   --   --  2.4  --  2.5*  PHOS 6.4* 8.5*  --  6.1* 6.2* 5.0*  --  4.9*  4.9* 4.1 4.4  < > = values in this interval not displayed. Estimated Creatinine Clearance: 43.1 mL/min (A) (by C-G formula based on SCr of 2.73 mg/dL (H)).   LIVER  Recent Labs Lab 03/31/16 0449 04/02/16 1412 04/04/16 0351 04/04/16 1643 04/05/16 0435 04/05/16 1830 04/06/16 0426  AST 100*  --   --   --   --   --   --   ALT 93*  --   --   --   --   --   --   ALKPHOS 295*  --   --   --   --   --   --   BILITOT 2.8*  --   --   --   --   --   --   PROT 6.0*  --   --   --   --   --   --  ALBUMIN 1.5*  --  1.2* 1.4* 1.4* 1.4* 1.4*  INR  --  1.08  --   --   --   --   --      INFECTIOUS No results for input(s): LATICACIDVEN, PROCALCITON in the last 168 hours.   ENDOCRINE CBG (last 3)   Recent Labs  04/05/16 1928 04/06/16 0036 04/06/16 0351  GLUCAP  110* 129* 121*         IMAGING x48h  - image(s) personally visualized  -   highlighted in bold Dg Chest Port 1 View  Result Date: 04/06/2016 CLINICAL DATA:  Intubation.  Shortness of breath . EXAM: PORTABLE CHEST 1 VIEW COMPARISON:  04/05/2016 . FINDINGS: Endotracheal tube and feeding tube in stable position. Left IJ line in stable position. Heart size stable. Diffuse bilateral pulmonary infiltrates again noted without interim change. No pleural effusion or pneumothorax . IMPRESSION: 1. Endotracheal tube, feeding tube, left IJ line stable position. 2. Diffuse bilateral pulmonary infiltrates, no change from prior exam. Electronically Signed   By: [place]   On: 04/06/2016 06:58   Dg Chest Port 1 View  Result Date: 04/05/2016 CLINICAL DATA:  Intubated.  Followup bilateral airspace disease. EXAM: PORTABLE CHEST 1 VIEW COMPARISON:  Yesterday. FINDINGS: Endotracheal tube in satisfactory position. Feeding tube extending into the stomach. The left jugular catheter tip remains projected over the medial left lung apex. Normal sized heart. Mildly improved diffuse bilateral airspace opacity. No pleural fluid. Thoracic spine degenerative changes and mild right shoulder degenerative changes. IMPRESSION: 1. Mildly improved diffuse bilateral pneumonia, alveolar edema or ARDS. 2. The left jugular catheter tip remains projected over the medial left lung apex, most likely in the left innominate vein. Electronically Signed   By: [name] M.D.   On: 04/05/2016 07:14       DISCUSSION: 56 year old male admitted with AMS and AKI. Was improving but suffered massive coffee ground emesis and subsequent brief cardiac arrest. Developed ARDS & shock. Mental status intact but hypoxia very slow to improve  ASSESSMENT / PLAN:  PULMONARY A: Acute hypoxemic respiratory failure: suspect he aspirated on emesis, CXR and exam also concerning ALI vs edema. COPD without acute exacerbation. OSA. BL PNA -favor  Aspiration  - 4/2 ARDS with 60% fio2, peep 8 and CXR unchanged from yesterday  P:   PCV vent Scheduled bronchodilators Nimbex from 04/04/16 - plan 48 to 72h Plan for Tstomy when eligible - if family concurs  CARDIOVASCULAR A:  Shock: hemorrhagic, +/- septic  H/o HTN, CHF. -septic shock resolved  04/05/16  P:  MAP goal > 65 Goal CVP 10-12 range  RENAL A:   Acute renal failure - presumed ATN in setting dehydration/ARB/NSAIDs/shock. STrated CRRT 04/03/16 4/1/1 - on CRRT. ANuric  P:   Per renal  GASTROINTESTINAL A:   GIB - severe esophagitis on EGD. No acute bleed so no interventions performed. Acute pancreatitis - per CT but lipase / amylase normal, suspect more chronic . H/o Barrett's esophagus. Constipation P:   tube feeding Hold off on GI consultation as his hemoglobin has stabilized but recall at evidence of GIB Protonix per tube daily No BM since admit - repeat Dulcolax suppository &  lactulose  HEMATOLOGIC A:   ABLA secondary to GIB, some continued blood loss, no hematemesis, no melena yet; anemia of critical illness P:  Follow CBC Transfusion goal Hgb > 7  INFECTIOUS A:   Aspiration PNA, LLL Yeast resp cx, unclear significance  S. Viridans 1 of 2  blood cx, repeat negative so far   -off pressors 04/05/16    P:   Continue Zosyn until 3/29, then ancef to complete 14 dc if resp improvement, ancef stopped 4/1 for total of 11 days abx Vancomycin stopped on 3/23  ENDOCRINE A:   Hyperglycemia. P:   SSI  NEUROLOGIC A:   Acute metabolic encephalopathy: multifactorial - withdrawal, uremia, hepatic, sedation. At risk ETOH/benzo withdrawal.  -04/05/16  -deeply sedated and paraluyzed  P   Fent gtt and versed gtt Nimbex gtt for 48-72h Off  Klonopin Off  diprivan Off  deroquel  FAMILY  - Updates:  updated daughter  3/29 - agreeable to trach, outlined guarded prognosis for full recovery from ARDS.   - Inter-disciplinary family meet or Palliative Care  meeting due by: 3/25 - dopne 3/30 and again 04/04/16 daughter and family agreed to DNR. Then on 04/05/16 - spoke to elder daughter Tillie Rung - says yesterday that patient mom from [facility] and his sister an  RN Stanton Kidney do not care about patient an forced the kids into DNR. Now, they want to wait for maternal aunt from [place]) and want to rescind DNR - they want CPR atleast till 04/07/16 . Says  She is not "stupid" and knows reality.    Julious Oka, MD Internal Medicine Resident, PGY [facility] Pager: [phone]    04/06/2016 8:01 AM

## 2016-04-06 NOTE — Progress Notes (Signed)
PULMONARY / CRITICAL CARE MEDICINE   Name: Jesse Hartman MRN: [id number] DOB: [birth year]    ADMISSION DATE:  03/30/2016 CONSULTATION DATE:  03/12/2016  REFERRING MD:  Dr. Vanita Panda  CHIEF COMPLAINT:  AMS  BRIEF SUMMARY:  56 year old male admitted 3/19 after being found down by his daughter confused.  Recently had been started on lasix / potassium (3/12).  Initial work up concerning for hypotension, hyperkalemia and AKI (sr cr 4.2).  While in CT, he had an episode of coffee ground emesis / hypotension.  Despite volume resuscitation, he remained hypotensive.  Suffered a 6 min cardiac arrest pm of 3/19 > intubated / on pressors.  CVVHD initiated.  PMH of ETOH (24 pack per day) / polysubstance abuse, COPD, Barrett's esophagus, HTN, Seizures, and OSA  SUBJECTIVE:  RN reports daughter would like a phone call from provider.  No acute events overnight - remains sedated / paralyzed on vent. PEEP 10/FiO2 50%.  Keeping net neg 250/hr per CVVHD   VITAL SIGNS: BP 100/64   Pulse (!) 110   Temp 99.3 F (37.4 C) (Oral)   Resp (!) 35   Ht 6' (1.829 m)   Wt 285 lb 11.5 oz (129.6 kg)   SpO2 97%   BMI 38.75 kg/m   HEMODYNAMICS: CVP:  [15 mmHg-18 mmHg] 18 mmHg  VENTILATOR SETTINGS: Vent Mode: PRVC FiO2 (%):  [45 %-60 %] 50 % Set Rate:  [35 bmp] 35 bmp Vt Set:  [500 mL] 500 mL PEEP:  [8 cmH20-10 cmH20] 10 cmH20 Plateau Pressure:  [26 cmH20-29 cmH20] 29 cmH20  INTAKE / OUTPUT: I/O last 3 completed shifts: In: 4078.2 [I.V.:2408.2; NG/GT:1420; IV Piggyback:250] Out: 4098 [Urine:140; JXBJY:7829]  PHYSICAL EXAMINATION:  General: critically ill appearing male in NAD HEENT: MM pink/moist, ETT Neuro:  Sedate / paralyzed  CV: s1s2 rrr, no m/r/g PULM: even/non-labored, lungs bilaterally coarse, faint wheeze FA:OZHY, non-tender, bsx4 active  Extremities: warm/dry, generalized 1-2+ pitting edema  Skin: no rashes or lesions   LABS: PULMONARY  Recent Labs Lab 03/30/16 1215 03/31/16 0405  04/01/16 1212  04/03/16 0110 04/03/16 1256 04/03/16 1445 04/03/16 2305 04/04/16 0255  PHART 7.411 7.404 7.309*  < > 7.242* 7.170* 7.117* 7.118* 7.366  PCO2ART 62.0* 52.8* 64.9*  < > 83.4* 108* ABOVE REPORTABLE RANGE ABOVE REPORTABLE RANGE 64.0*  PO2ART 56.0* 52.0* 62.0*  < > 160* 88.8 81.2* 88.0 238*  HCO3 39.4* 33.0* 32.8*  < > 34.6* 37.9* 37.7* 39.8* 35.7*  TCO2 41 35 35  --   --   --   --   --   --   O2SAT 88.0 86.0 89.0  < > 99.0 94.8 92.6 95.4 99.6  < > = values in this interval not displayed.  CBC  Recent Labs Lab 04/04/16 0350 04/05/16 0435 04/06/16 0426  HGB 7.0* 7.3* 7.2*  HCT 23.3* 22.9* 23.3*  WBC 10.2 9.4 8.1  PLT 243 264 248    COAGULATION  Recent Labs Lab 04/02/16 1412  INR 1.08    CARDIAC  No results for input(s): TROPONINI in the last 168 hours. No results for input(s): PROBNP in the last 168 hours.   CHEMISTRY  Recent Labs Lab 04/02/16 0445 04/03/16 0434  04/04/16 0350 04/04/16 0351 04/04/16 1643 04/05/16 0000 04/05/16 0435 04/05/16 1830 04/06/16 0426  NA 147* 148*  < >  --  143 138 137 137 134* 137  K 3.7 3.8  < >  --  4.1 4.8 3.8 4.0 3.9 4.5  CL 106 100*  < >  --  95* 98* 97* 95* 95* 98*  CO2 33* 38*  < >  --  35* 33* 32 32 30 31  GLUCOSE 151* 124*  < >  --  118* 140* 142* 152* 122* 139*  BUN 47* 57*  < >  --  55* 49* 46* 41* 37* 38*  CREATININE 2.05* 3.36*  < >  --  3.50* 3.11* 3.07* 2.94* 2.67* 2.73*  CALCIUM 8.5* 7.7*  < >  --  7.0* 7.4* 7.8* 8.0* 8.1* 8.4*  MG 2.1 2.2  --  2.2  --   --   --  2.4  --  2.5*  PHOS 6.4* 8.5*  --  6.1* 6.2* 5.0*  --  4.9*  4.9* 4.1 4.4  < > = values in this interval not displayed. Estimated Creatinine Clearance: 43.1 mL/min (A) (by C-G formula based on SCr of 2.73 mg/dL (H)).   LIVER  Recent Labs Lab 03/31/16 0449 04/02/16 1412 04/04/16 0351 04/04/16 1643 04/05/16 0435 04/05/16 1830 04/06/16 0426  AST 100*  --   --   --   --   --   --   ALT 93*  --   --   --   --   --   --   ALKPHOS  295*  --   --   --   --   --   --   BILITOT 2.8*  --   --   --   --   --   --   PROT 6.0*  --   --   --   --   --   --   ALBUMIN 1.5*  --  1.2* 1.4* 1.4* 1.4* 1.4*  INR  --  1.08  --   --   --   --   --      INFECTIOUS No results for input(s): LATICACIDVEN, PROCALCITON in the last 168 hours.   ENDOCRINE CBG (last 3)   Recent Labs  04/06/16 0036 04/06/16 0351 04/06/16 0834  GLUCAP 129* 121* 127*     IMAGING x48h Dg Chest Port 1 View  Result Date: 04/06/2016 CLINICAL DATA:  Intubation.  Shortness of breath . EXAM: PORTABLE CHEST 1 VIEW COMPARISON:  04/05/2016 . FINDINGS: Endotracheal tube and feeding tube in stable position. Left IJ line in stable position. Heart size stable. Diffuse bilateral pulmonary infiltrates again noted without interim change. No pleural effusion or pneumothorax . IMPRESSION: 1. Endotracheal tube, feeding tube, left IJ line stable position. 2. Diffuse bilateral pulmonary infiltrates, no change from prior exam. Electronically Signed   By: [place]   On: 04/06/2016 06:58   Dg Chest Port 1 View  Result Date: 04/05/2016 CLINICAL DATA:  Intubated.  Followup bilateral airspace disease. EXAM: PORTABLE CHEST 1 VIEW COMPARISON:  Yesterday. FINDINGS: Endotracheal tube in satisfactory position. Feeding tube extending into the stomach. The left jugular catheter tip remains projected over the medial left lung apex. Normal sized heart. Mildly improved diffuse bilateral airspace opacity. No pleural fluid. Thoracic spine degenerative changes and mild right shoulder degenerative changes. IMPRESSION: 1. Mildly improved diffuse bilateral pneumonia, alveolar edema or ARDS. 2. The left jugular catheter tip remains projected over the medial left lung apex, most likely in the left innominate vein. Electronically Signed   By: [name] M.D.   On: 04/05/2016 07:14   STUDIES:  CT head 3/19 >>  No evidence of acute intracranial abnormality. Generalized cerebral volume loss. Mild  paranasal sinusitis of uncertain acuity  CT abd/pelv 3/19 >>  stranding around the pancreas compatible with acute pancreatitis. Fatty infiltration of the liver. Distended, fluid-filled stomach. EGD 3/19 >> Severe esophagitis with no acute hemorrhage   CULTURES: Blood 3/19 >> 1/2 with S viridans Urine 3/19 >> insignificant growth Resp 3/21 >> abundant yeast, PMNs >> reincubated >>  Few yeast Blood 3/21 >> neg Urine 3/21 >> negative  LINES/TUBES: CVL 3/19 >> ETT 3/19 >> L fem art line 3/21 >> 3/28   ANTIBIOTICS: vanco 3/19 >> 3/20 vanco 3/22 >> 3/23 Zosyn 3/19 >> 3/31 Ancef 3/31 >> 4/1  SIGNIFICANT EVENTS: 3/19  Admit, ARF 3/19  PM cardiac arrest 6 mins, intubated. On pressors. PRBC's tx  3/23  Increased infiltrates over the last 24 hours on chest x-ray ? Aspiration, PRBC tx 3/24  Sedation changed to propofol   3/25  Significant cough that causes auto PEEP and subsequently hypotension 3/29  Vent dyssynchrony,  high dose levo gtt.Good UO 3/30  On levophed 54mcg. fio2 70%, peep 5 and sedated. On bic gtt. Makes urine but rising creat.  CRRT started 3/31  Overnight brief cpr. On CRRT . Nimbex +. On 2 pressors. fio2 60%. Made DNR 4/01  Family overturned DNR.  Off pressors.   Mucus plugging, on fio2 60%, peep 8. CVVHD + Anuric   DISCUSSION: 56 year old male admitted with AMS and AKI. Had episode of coffee ground emesis in CT with subsequent cardiac arrest > shock / ARDS.  Course complicated by AKI requiring CRRT.    ASSESSMENT / PLAN:  PULMONARY A: Acute hypoxemic respiratory failure - in setting of suspected aspiration of GI contents ARDS - likely ALI from aspiration, edema COPD without Acute Exacerbation  OSA  Aspiration PNA P:   PRVC 8 cc/kg  Wean PEEP / FiO2 for sats > 90% Duoneb Q6 Trend CXR Discontinue Nimbex  May need tracheostomy pending outcome of further discussion with family   CARDIOVASCULAR A:  Shock - hemorrhagic +/- septic   H/o HTN, CHF P:  Monitor  off pressors  MAP goal > 65  CVP goal 10-12   RENAL A:   Acute renal failure - presumed ATN in setting dehydration, ARB, NSAIDs, shock. Started CRRT 04/03/16 P:   Nephrology following, appreciate input   Trend BMP / urinary output Replace electrolytes as indicated Avoid nephrotoxic agents, ensure adequate renal perfusion  GASTROINTESTINAL A:   GIB - severe esophagitis on EGD without acute bleed so no interventions performed. Pancreatitis - per CT but lipase / amylase normal, suspect chronic . H/o Barrett's esophagus. Constipation P:   Continue TF per Nutrition  Bowel regimen > senokot-S + miralax, PRN dulcolax suppository PPI BID   HEMATOLOGIC A:   ABLA secondary to GIB, some continued blood loss, no hematemesis, no melena yet Anemia of Critical Illness  P:  Trend CBC  Transfuse for Hgb <7% or active bleeding   INFECTIOUS A:   Aspiration PNA, LLL Yeast resp cx, unclear significance  S. Viridans 1 of 2 blood cx, repeat negative so far P:   Off abx, monitor clinically  Cultures as above  ENDOCRINE A:   Hyperglycemia. P:   SSI  NEUROLOGIC A:   Acute Metabolic Encephalopathy - multifactorial in the setting of withdrawal, uremia, hepatic, sedation. At risk ETOH/benzo withdrawal. Paralytics for Vent Dyssynchrony  P:  Continue fentanyl / versed gtt's for sedation  D/c Nimbex, monitor for vent dyssynchrony  Monitor off PO meds > klonopin, seroquel    FAMILY  - Updates: No family at bedside.  Will attempt  to reach via phone > Tillie Rung [phone] to arrange for group meeting as there have been differing family opinions regarding patients plan of care.  (DNR reversed)  NP CC Time: 67 minutes   Noe Gens, NP-C [facility] Pgr: [phone] or if no answer [phone] 04/06/2016, 11:19 AM  Attending Note:  56 year old with aspiration pneumonia who developed ARDS and was sedated and paralyzed for vent asynchrony.  Patient also had a cardiac  arrest during hospitalization.  On exam, paralyzed with coarse BS diffusely.  I reviewed CXR myself, pulmonary infiltrate noted and ETT in good position.  We were going to trach patient last week however he desaturated profoundly and procedure was delayed.  Family continues to insist on full code status.  Will continue volume negative via CRRT.  Continue weaning FiO2 and PEEP as able.  Continue abx and will have ongoing discussion with family regarding plan of care.  Will order an EEG and ask neurology to evaluate patient.    The patient is critically ill with multiple organ systems failure and requires high complexity decision making for assessment and support, frequent evaluation and titration of therapies, application of advanced monitoring technologies and extensive interpretation of multiple databases.   Critical Care Time devoted to patient care services described in this note is  35  Minutes. This time reflects time of care of this signee Dr Jennet Maduro. This critical care time does not reflect procedure time, or teaching time or supervisory time of PA/NP/Med student/Med Resident etc but could involve care discussion time.  Rush Farmer, M.D. [facility] Pulmonary/Critical Care Medicine. Pager: [phone]. After hours pager: [phone].

## 2016-04-06 NOTE — Progress Notes (Signed)
ABG results reported to Dr.Yacoub. 

## 2016-04-06 NOTE — Plan of Care (Signed)
Extensive discussion with daughters - Tillie Rung & Adria at bedside.  Reviewed patients current critical illness to include reason for admission, AKI, GIB, cardiac arrest, shock etc.  They seem to have an understanding of the nature of his critical illness and that he is not likely to recover.  He has been able to come off pressors and is tolerating net neg 250 ml/hr with CVVHD from a hemodynamic standpoint.  The daughters indicate that he would not want to "live on machines or in a facility".  Reviewed that today is day 14 on the vent.  They would not want trach / PEG.  They have family to arrive on Wednesday and feel they need their Aunt to help make decisions.  The daughters state that their "family is awful and they have no one".  Their mother is deceased.  They are not ready to make him a no CPR/ACLS.   Plan: Re-visit on Thursday at 10 am for family meeting if he maintains current trend, would be withdrawal of care.   No trach / PEG  Doubt he would be a long term dialysis candidate given his history     Interdisciplinary Goals of Care Family Meeting   Date carried out:: 04/06/2016  Location of the meeting: Bedside  Member's involved: Nurse Practitioner, Bedside Registered Nurse and Family Member or next of kin  Durable Power of Attorney or acting medical decision maker: Daughters - Adria + Tillie Rung     Discussion: We discussed goals of care for [employer] .  See above   Code status: Full Code  Disposition: Continue current acute care  Time spent for the meeting: 80 minutes   Noe Gens, NP-C [place] Pgr: [phone] or if no answer [phone] 04/06/2016, 1:24 PM  Rush Farmer, M.D. [facility] Pulmonary/Critical Care Medicine. Pager: [phone]. After hours pager: [phone].

## 2016-04-07 ENCOUNTER — Inpatient Hospital Stay (HOSPITAL_COMMUNITY): Payer: Medicare Other

## 2016-04-07 DIAGNOSIS — G9340 Encephalopathy, unspecified: Secondary | ICD-10-CM

## 2016-04-07 LAB — CBC
HCT: 23.5 % — ABNORMAL LOW (ref 39.0–52.0)
HEMOGLOBIN: 7.4 g/dL — AB (ref 13.0–17.0)
MCH: 30.6 pg (ref 26.0–34.0)
MCHC: 31.5 g/dL (ref 30.0–36.0)
MCV: 97.1 fL (ref 78.0–100.0)
PLATELETS: 248 10*3/uL (ref 150–400)
RBC: 2.42 MIL/uL — AB (ref 4.22–5.81)
RDW: 17.8 % — ABNORMAL HIGH (ref 11.5–15.5)
WBC: 9.7 10*3/uL (ref 4.0–10.5)

## 2016-04-07 LAB — RENAL FUNCTION PANEL
ALBUMIN: 1.4 g/dL — AB (ref 3.5–5.0)
ANION GAP: 6 (ref 5–15)
Albumin: 1.4 g/dL — ABNORMAL LOW (ref 3.5–5.0)
Anion gap: 6 (ref 5–15)
BUN: 38 mg/dL — ABNORMAL HIGH (ref 6–20)
BUN: 38 mg/dL — ABNORMAL HIGH (ref 6–20)
CALCIUM: 8.2 mg/dL — AB (ref 8.9–10.3)
CALCIUM: 8.2 mg/dL — AB (ref 8.9–10.3)
CHLORIDE: 100 mmol/L — AB (ref 101–111)
CO2: 27 mmol/L (ref 22–32)
CO2: 27 mmol/L (ref 22–32)
Chloride: 101 mmol/L (ref 101–111)
Creatinine, Ser: 2.93 mg/dL — ABNORMAL HIGH (ref 0.61–1.24)
Creatinine, Ser: 2.82 mg/dL — ABNORMAL HIGH (ref 0.61–1.24)
GFR calc Af Amer: 28 mL/min — ABNORMAL LOW (ref >60)
GFR, EST AFRICAN AMERICAN: 26 mL/min — AB (ref >60)
GFR, EST NON AFRICAN AMERICAN: 23 mL/min — AB (ref >60)
GFR, EST NON AFRICAN AMERICAN: 24 mL/min — AB (ref >60)
GLUCOSE: 131 mg/dL — AB (ref 65–99)
Glucose, Bld: 125 mg/dL — ABNORMAL HIGH (ref 65–99)
PHOSPHORUS: 2.9 mg/dL (ref 2.5–4.6)
POTASSIUM: 4.5 mmol/L (ref 3.5–5.1)
Phosphorus: 2.9 mg/dL (ref 2.5–4.6)
Potassium: 4.4 mmol/L (ref 3.5–5.1)
SODIUM: 134 mmol/L — AB (ref 135–145)
Sodium: 133 mmol/L — ABNORMAL LOW (ref 135–145)

## 2016-04-07 LAB — CBC WITH DIFFERENTIAL/PLATELET
BASOS ABS: 0.1 10*3/uL (ref 0.0–0.1)
Basophils Relative: 1 %
Eosinophils Absolute: 0.2 10*3/uL (ref 0.0–0.7)
Eosinophils Relative: 2 %
HEMATOCRIT: 21.2 % — AB (ref 39.0–52.0)
HEMOGLOBIN: 6.5 g/dL — AB (ref 13.0–17.0)
LYMPHS ABS: 1.4 10*3/uL (ref 0.7–4.0)
LYMPHS PCT: 17 %
MCH: 30 pg (ref 26.0–34.0)
MCHC: 30.7 g/dL (ref 30.0–36.0)
MCV: 97.7 fL (ref 78.0–100.0)
Monocytes Absolute: 0.7 10*3/uL (ref 0.1–1.0)
Monocytes Relative: 8 %
NEUTROS ABS: 6.1 10*3/uL (ref 1.7–7.7)
NEUTROS PCT: 73 %
PLATELETS: 223 10*3/uL (ref 150–400)
RBC: 2.17 MIL/uL — AB (ref 4.22–5.81)
RDW: 17.8 % — ABNORMAL HIGH (ref 11.5–15.5)
WBC: 8.4 10*3/uL (ref 4.0–10.5)

## 2016-04-07 LAB — APTT: APTT: 54 s — AB (ref 24–36)

## 2016-04-07 LAB — GLUCOSE, CAPILLARY
GLUCOSE-CAPILLARY: 116 mg/dL — AB (ref 65–99)
GLUCOSE-CAPILLARY: 117 mg/dL — AB (ref 65–99)
GLUCOSE-CAPILLARY: 127 mg/dL — AB (ref 65–99)
GLUCOSE-CAPILLARY: 132 mg/dL — AB (ref 65–99)
GLUCOSE-CAPILLARY: 141 mg/dL — AB (ref 65–99)
Glucose-Capillary: 150 mg/dL — ABNORMAL HIGH (ref 65–99)

## 2016-04-07 LAB — PREPARE RBC (CROSSMATCH)

## 2016-04-07 LAB — POCT ACTIVATED CLOTTING TIME
ACTIVATED CLOTTING TIME: 175 s
ACTIVATED CLOTTING TIME: 180 s
ACTIVATED CLOTTING TIME: 186 s
Activated Clotting Time: 164 seconds

## 2016-04-07 LAB — MAGNESIUM: Magnesium: 2.4 mg/dL (ref 1.7–2.4)

## 2016-04-07 LAB — OCCULT BLOOD X 1 CARD TO LAB, STOOL: FECAL OCCULT BLD: POSITIVE — AB

## 2016-04-07 LAB — PHOSPHORUS: Phosphorus: 3 mg/dL (ref 2.5–4.6)

## 2016-04-07 MED ORDER — ACETAMINOPHEN 160 MG/5ML PO SOLN
500.0000 mg | Freq: Four times a day (QID) | ORAL | Status: DC | PRN
  Administered 2016-04-07 – 2016-04-08 (×3): 500 mg via ORAL
  Filled 2016-04-07 (×3): qty 20.3

## 2016-04-07 MED ORDER — HEPARIN SODIUM (PORCINE) 1000 UNIT/ML IJ SOLN
5000.0000 [IU] | Freq: Three times a day (TID) | INTRAMUSCULAR | Status: DC
  Filled 2016-04-07: qty 5

## 2016-04-07 MED ORDER — HEPARIN BOLUS VIA INFUSION (CRRT)
5000.0000 [IU] | INTRAVENOUS | Status: DC | PRN
  Administered 2016-04-07: 5000 [IU] via INTRAVENOUS_CENTRAL
  Filled 2016-04-07: qty 5000

## 2016-04-07 MED ORDER — SODIUM CHLORIDE 0.9 % IV SOLN
Freq: Once | INTRAVENOUS | Status: AC
  Administered 2016-04-07: 05:00:00 via INTRAVENOUS

## 2016-04-07 MED ORDER — VITAL HIGH PROTEIN PO LIQD
1000.0000 mL | ORAL | Status: DC
  Administered 2016-04-07 – 2016-04-08 (×3): 1000 mL
  Administered 2016-04-08: 12:00:00

## 2016-04-07 MED ORDER — CALCIUM GLUCONATE 10 % IV SOLN
20.0000 g | INTRAVENOUS | Status: DC
  Administered 2016-04-08 (×2): 20 g via INTRAVENOUS_CENTRAL
  Filled 2016-04-07 (×4): qty 200

## 2016-04-07 MED ORDER — DEXTROSE 5 % IV SOLN
Status: DC
  Administered 2016-04-08 – 2016-04-09 (×4): via INTRAVENOUS_CENTRAL
  Filled 2016-04-07 (×7): qty 1500

## 2016-04-07 NOTE — Care Management Note (Signed)
Case Management Note  Patient Details  Name: Jesse Hartman MRN: [id number] Date of Birth: 09-17-1960  Subjective/Objective:    Admitted post being found down by daughter            Action/Plan:   PTA from home, excessive alcohol abuse - CSW consulted.  Pt is ventilated.  CM will continue to follow for discharge needs   Expected Discharge Date:                  Expected Discharge Plan:     In-House Referral:  Clinical Social Work  Discharge planning Services  CM Consult  Post Acute Care Choice:    Choice offered to:     DME Arranged:    DME Agency:     HH Arranged:    [place] Agency:     Status of Service:  In process, will continue to follow  If discussed at Long Length of Stay Meetings, dates discussed:    Additional Comments: 04/07/2016  Pt remains intubated - tentative plan for trach.  Pt also remains on CRRT [name], RN 04/07/2016, 3:56 PM

## 2016-04-07 NOTE — Progress Notes (Signed)
EEG completed, results pending. 

## 2016-04-07 NOTE — Progress Notes (Signed)
Subjective: Interval History: paralyzed on vent.  Objective: Vital signs in last 24 hours: Temp:  [98.9 F (37.2 C)-100.8 F (38.2 C)] 100.4 F (38 C) (04/03 0645) Pulse Rate:  [79-120] 109 (04/03 0700) Resp:  [14-35] 18 (04/03 0700) BP: (73-118)/(49-67) 87/52 (04/03 0700) SpO2:  [73 %-100 %] 100 % (04/03 0700) FiO2 (%):  [45 %-50 %] 50 % (04/03 0622) Weight:  [125.3 kg (276 lb 3.8 oz)] 125.3 kg (276 lb 3.8 oz) (04/03 0400) Weight change: -4.3 kg (-9 lb 7.7 oz)  Intake/Output from previous day: 04/02 0701 - 04/03 0700 In: 2233.6 [I.V.:1236.9; Blood:41.7; NG/GT:955] Out: 2863 [Urine:18] Intake/Output this shift: No intake/output data recorded.  General appearance: IJ PC Neck: nonresponsive on vent. Resp: rales bibasilar and rhonchi bibasilar Cardio: S1, S2 normal and systolic murmur: holosystolic 2/6, blowing at apex GI: obese, pos bs Extremities: edema 3-4+  Lab Results:  Recent Labs  04/06/16 0426 04/07/16 0428  WBC 8.1 8.4  HGB 7.2* 6.5*  HCT 23.3* 21.2*  PLT 248 223   BMET:  Recent Labs  04/06/16 1617 04/07/16 0428  NA 135 133*  K 5.0 4.4  CL 98* 100*  CO2 30 27  GLUCOSE 129* 125*  BUN 35* 38*  CREATININE 2.69* 2.93*  CALCIUM 8.5* 8.2*   No results for input(s): PTH in the last 72 hours. Iron Studies: No results for input(s): IRON, TIBC, TRANSFERRIN, FERRITIN in the last 72 hours.  Studies/Results: Dg Chest Port 1 View  Result Date: 04/07/2016 CLINICAL DATA:  Respiratory failure. EXAM: PORTABLE CHEST 1 VIEW COMPARISON:  04/06/2016. FINDINGS: Endotracheal tube, left IJ line, feeding tube in stable position. Stable cardiomegaly. Diffuse bilateral pulmonary infiltrates again noted without significant change. Left lower lobe atelectasis. Small left pleural effusion. IMPRESSION: 1. Lines and tubes in stable position. 2. Diffuse bilateral pulmonary infiltrates again noted without significant change. Left lower lobe atelectasis. Small left pleural effusion.  Electronically Signed   By: Marcello Moores  Register   On: 04/07/2016 06:39   Dg Chest Port 1 View  Result Date: 04/06/2016 CLINICAL DATA:  Intubation.  Shortness of breath . EXAM: PORTABLE CHEST 1 VIEW COMPARISON:  04/05/2016 . FINDINGS: Endotracheal tube and feeding tube in stable position. Left IJ line in stable position. Heart size stable. Diffuse bilateral pulmonary infiltrates again noted without interim change. No pleural effusion or pneumothorax . IMPRESSION: 1. Endotracheal tube, feeding tube, left IJ line stable position. 2. Diffuse bilateral pulmonary infiltrates, no change from prior exam. Electronically Signed   By: [place]   On: 04/06/2016 06:58    I have reviewed the patient's current medications.  Assessment/Plan: 1 AKI oliguric ATN, vol xs.  BP soft, lower net neg. Acid/base/K/solute ok.   2 Fever off AB,  3 Anemia getting blood 4 polysubstance abuse 5 Nuitrition 6 COPD 7 Asp P CRRT, lower net neg, stop foley, vent. Transfuse,culture    LOS: 15 days   Urias Sheek L 04/07/2016,7:12 AM

## 2016-04-07 NOTE — Progress Notes (Signed)
Nutrition Follow-up  DOCUMENTATION CODES:   Obesity unspecified  INTERVENTION:    Increase Vital High Protein to goal rate of 75 ml/h (1800 ml per day)  Provides 1800 kcal, 158 gm protein, 1505 ml free water daily  NUTRITION DIAGNOSIS:   Inadequate oral intake related to inability to eat as evidenced by NPO status.  Ongoing  GOAL:   Provide needs based on ASPEN/SCCM guidelines  Progressing  MONITOR:   Vent status, Labs, I & O's  ASSESSMENT:   56 year old male admitted with AMS and AKI. Was improving but suffered massive coffee ground emesis and subsequent brief cardiac arrest. Required intubation on 3/19.   Discussed patient with RN today. Tolerating TF well. Patient now on CRRT. Cortrak tube in place with tip in the duodenum.  Currently receiving Vital High Protein via Cortrak tube at 40 ml/h (960 ml/day) providing 960 kcals, 84 gm protein, 803 ml free water daily.  TF goal rate is 75 ml/h, will update TF order. Patient remains intubated on ventilator support MV: 14.5 L/min Temp (24hrs), Avg:99.9 F (37.7 C), Min:98.7 F (37.1 C), Max:100.8 F (38.2 C)  Labs reviewed. Sodium 133 (L), Phosphorus 2.9 WNL CBG's: 128-133 Medications reviewed and include folic acid, Miralax, and thiamine.  Diet Order:  Diet NPO time specified  Skin:  Reviewed, no issues  Last BM:  4/3  Height:   Ht Readings from Last 1 Encounters:  03/24/16 6' (1.829 m)    Weight:   Wt Readings from Last 1 Encounters:  04/07/16 276 lb 3.8 oz (125.3 kg)    Ideal Body Weight:  80.9 kg  BMI:  Body mass index is 37.46 kg/m.  Estimated Nutritional Needs:   Kcal:  1450-1850  Protein:  162 gm  Fluid:  2 L  EDUCATION NEEDS:   No education needs identified at this time  Molli Barrows, [place], [place], [place] Pager [phone] After Hours Pager [phone]

## 2016-04-07 NOTE — Procedures (Signed)
ELECTROENCEPHALOGRAM REPORT  Date of Study: 04/07/2016  Patient's Name: Jesse Hartman MRN: [id number] Date of Birth: 11/21/1960  Referring Provider: Norman Herrlich, MD  Clinical History: 56 year old man status post cardiac arrest.  Medications: fentaNYL (SUBLIMAZE)  midazolam (VERSED) 50 mg in sodium chloride 0.9 % 50 mL (1 mg/mL) infusion  Insulin thiamine (VITAMIN B-1) tablet 100 mg   Technical Summary: A multichannel digital EEG recording measured by the international 10-20 system with electrodes applied with paste and impedances below 5000 ohms performed in our laboratory with EKG monitoring in an intubated and sedated patient.  Hyperventilation and photic stimulation were not performed.  The digital EEG was referentially recorded, reformatted, and digitally filtered in a variety of bipolar and referential montages for optimal display.    Description: The patient is intubated and sedated during the recording. The patient is no Fentantyl.  Versed was turned off just prior to start of the recording.  There is symmetric and diffuse slowing of the background, with 2-3 Hz delta activity .  Except for slightly opening eyes a couple of times, there overall was no spontaneous reactivity or reactivity noted with noxious stimulation.  Sleep spindles were seen. There were no epileptiform discharges or electrographic seizures seen.   EKG lead was unremarkable.  Impression: This EEG is abnormal due to diffuse slowing of the background.  Clinical Correlation of the above findings indicates severe diffuse cerebral dysfunction that is non-specific in etiology and can be seen in the setting of anoxic/ischemic injury, toxic/metabolic encephalopathies, or medication effect. If clinically indicated, consider repeating study with patient completely off of sedating medications.  Metta Clines, DO

## 2016-04-07 NOTE — Progress Notes (Addendum)
PULMONARY / CRITICAL CARE MEDICINE   Name: Jesse Hartman MRN: [id number] DOB: January 07, 1960    ADMISSION DATE:  03/17/2016 CONSULTATION DATE:  03/08/2016  REFERRING MD:  Dr. Vanita Panda  CHIEF COMPLAINT:  AMS  BRIEF SUMMARY:  56 year old male admitted 3/19 after being found down by his daughter confused.  Recently had been started on lasix / potassium (3/12).  Initial work up concerning for hypotension, hyperkalemia and AKI (sr cr 4.2).  While in CT, he had an episode of coffee ground emesis / hypotension.  Despite volume resuscitation, he remained hypotensive.  Suffered a 6 min cardiac arrest pm of 3/19 > intubated / on pressors.  CVVHD initiated.  PMH of ETOH (24 pack per day) / polysubstance abuse, COPD, Barrett's esophagus, HTN, Seizures, and OSA  SUBJECTIVE:  Tmax 100.8 overnight. Sedated no vent, no report of blood in stools or emesis per RN.   VITAL SIGNS: BP (!) 87/52   Pulse (!) 109   Temp (!) 100.4 F (38 C)   Resp 18   Ht 6' (1.829 m)   Wt 276 lb 3.8 oz (125.3 kg)   SpO2 100%   BMI 37.46 kg/m   HEMODYNAMICS: CVP:  [18 mmHg] 18 mmHg  VENTILATOR SETTINGS: Vent Mode: PCV FiO2 (%):  [45 %-50 %] 50 % Set Rate:  [10 bmp-35 bmp] 18 bmp Vt Set:  [500 mL-620 mL] 620 mL PEEP:  [10 cmH20] 10 cmH20 Plateau Pressure:  [21 cmH20-32 cmH20] 32 cmH20  INTAKE / OUTPUT: I/O last 3 completed shifts: In: 3692.6 [I.V.:2095.9; Blood:41.7; NG/GT:1455; IV Piggyback:100] Out: 21194 [Urine:53; RDEYC:14481]  PHYSICAL EXAMINATION:  General: critically ill appearing male in NAD HEENT: MM pink/moist, ETT Neuro:  Sedate / paralyzed  CV: s1s2 rrr, no m/r/g PULM: even/non-labored, lungs bilaterally coarse, faint wheeze EH:UDJS, non-tender, bsx4 active  Extremities: warm/dry, generalized 1-2+ pitting edema  Skin: no rashes or lesions   LABS: PULMONARY  Recent Labs Lab 04/01/16 1212  04/03/16 1256 04/03/16 1445 04/03/16 2305 04/04/16 0255 04/06/16 1628  PHART 7.309*  < > 7.170*  7.117* 7.118* 7.366 7.298*  PCO2ART 64.9*  < > 108* ABOVE REPORTABLE RANGE ABOVE REPORTABLE RANGE 64.0* 59.4*  PO2ART 62.0*  < > 88.8 81.2* 88.0 238* 65.0*  HCO3 32.8*  < > 37.9* 37.7* 39.8* 35.7* 29.1*  TCO2 35  --   --   --   --   --  31  O2SAT 89.0  < > 94.8 92.6 95.4 99.6 90.0  < > = values in this interval not displayed.  CBC  Recent Labs Lab 04/05/16 0435 04/06/16 0426 04/07/16 0428  HGB 7.3* 7.2* 6.5*  HCT 22.9* 23.3* 21.2*  WBC 9.4 8.1 8.4  PLT 264 248 223    COAGULATION  Recent Labs Lab 04/02/16 1412  INR 1.08    CHEMISTRY  Recent Labs Lab 04/03/16 0434  04/04/16 0350  04/05/16 0435 04/05/16 1830 04/06/16 0426 04/06/16 1617 04/07/16 0428  NA 148*  < >  --   < > 137 134* 137 135 133*  K 3.8  < >  --   < > 4.0 3.9 4.5 5.0 4.4  CL 100*  < >  --   < > 95* 95* 98* 98* 100*  CO2 38*  < >  --   < > 32 '30 31 30 27  '$ GLUCOSE 124*  < >  --   < > 152* 122* 139* 129* 125*  BUN 57*  < >  --   < >  41* 37* 38* 35* 38*  CREATININE 3.36*  < >  --   < > 2.94* 2.67* 2.73* 2.69* 2.93*  CALCIUM 7.7*  < >  --   < > 8.0* 8.1* 8.4* 8.5* 8.2*  MG 2.2  --  2.2  --  2.4  --  2.5*  --  2.4  PHOS 8.5*  --  6.1*  < > 4.9*  4.9* 4.1 4.4 4.3 2.9  3.0  < > = values in this interval not displayed. Estimated Creatinine Clearance: 39.4 mL/min (A) (by C-G formula based on SCr of 2.93 mg/dL (H)).   LIVER  Recent Labs Lab 04/02/16 1412  04/05/16 0435 04/05/16 1830 04/06/16 0426 04/06/16 1617 04/07/16 0428  ALBUMIN  --   < > 1.4* 1.4* 1.4* 1.5* 1.4*  INR 1.08  --   --   --   --   --   --   < > = values in this interval not displayed.   INFECTIOUS No results for input(s): LATICACIDVEN, PROCALCITON in the last 168 hours.   ENDOCRINE CBG (last 3)   Recent Labs  04/06/16 1955 04/07/16 0031 04/07/16 0257  GLUCAP 126* 117* 116*     IMAGING x48h Dg Chest Port 1 View  Result Date: 04/07/2016 CLINICAL DATA:  Respiratory failure. EXAM: PORTABLE CHEST 1 VIEW COMPARISON:   04/06/2016. FINDINGS: Endotracheal tube, left IJ line, feeding tube in stable position. Stable cardiomegaly. Diffuse bilateral pulmonary infiltrates again noted without significant change. Left lower lobe atelectasis. Small left pleural effusion. IMPRESSION: 1. Lines and tubes in stable position. 2. Diffuse bilateral pulmonary infiltrates again noted without significant change. Left lower lobe atelectasis. Small left pleural effusion. Electronically Signed   By: Maisie Fus  Register   On: 04/07/2016 06:39   Dg Chest Port 1 View  Result Date: 04/06/2016 CLINICAL DATA:  Intubation.  Shortness of breath . EXAM: PORTABLE CHEST 1 VIEW COMPARISON:  04/05/2016 . FINDINGS: Endotracheal tube and feeding tube in stable position. Left IJ line in stable position. Heart size stable. Diffuse bilateral pulmonary infiltrates again noted without interim change. No pleural effusion or pneumothorax . IMPRESSION: 1. Endotracheal tube, feeding tube, left IJ line stable position. 2. Diffuse bilateral pulmonary infiltrates, no change from prior exam. Electronically Signed   By: Maisie Fus  Register   On: 04/06/2016 06:58   STUDIES:  CT head 3/19 >>  No evidence of acute intracranial abnormality. Generalized cerebral volume loss. Mild paranasal sinusitis of uncertain acuity  CT abd/pelv 3/19 >> stranding around the pancreas compatible with acute pancreatitis. Fatty infiltration of the liver. Distended, fluid-filled stomach. EGD 3/19 >> Severe esophagitis with no acute hemorrhage   CULTURES: Blood 3/19 >> 1/2 with S viridans Urine 3/19 >> insignificant growth Resp 3/21 >> abundant yeast, PMNs >> reincubated >>  Few yeast Blood 3/21 >> neg Urine 3/21 >> negative   LINES/TUBES: CVL 3/19 >> ETT 3/19 >> L fem art line 3/21 >> 3/28   ANTIBIOTICS: vanco 3/19 >> 3/20 vanco 3/22 >> 3/23 Zosyn 3/19 >> 3/31 Ancef 3/31 >> 4/1  SIGNIFICANT EVENTS: 3/19  Admit, ARF 3/19  PM cardiac arrest 6 mins, intubated. On pressors. PRBC's  tx  3/23  Increased infiltrates over the last 24 hours on chest x-ray ? Aspiration, PRBC tx 3/24  Sedation changed to propofol   3/25  Significant cough that causes auto PEEP and subsequently hypotension 3/29  Vent dyssynchrony,  high dose levo gtt.Good UO 3/30  On levophed . fio2 70%, peep 5 and sedated. On  bic gtt. Makes urine but rising creat.  CRRT started 3/31  Overnight brief cpr. On CRRT . Nimbex +. On 2 pressors. fio2 60%. Made DNR 4/01  Family overturned DNR.  Off pressors.   Mucus plugging, on fio2 60%, peep 8. CVVHD + Anuric   DISCUSSION: 56 year old male admitted with AMS and AKI. Had episode of coffee ground emesis in CT with subsequent cardiac arrest > shock / ARDS.  Course complicated by AKI requiring CRRT.    ASSESSMENT / PLAN:  PULMONARY A: Acute hypoxemic respiratory failure - in setting of suspected aspiration of GI contents ARDS - likely ALI from aspiration, edema COPD without Acute Exacerbation  OSA  Aspiration PNA P:   PRVC 8 cc/kg  Wean PEEP / FiO2 for sats > 90% Duoneb Q6 Trend CXR Discontinue Nimbex 4/2 Family meeting 4/5 regarding trach/peg vs withdrawal  CARDIOVASCULAR A:  Shock - hemorrhagic +/- septic   H/o HTN, CHF P:  Monitor off pressors  MAP goal > 65  CVP goal 10-12  Decreased CRRT volume negative from 100 to 50 given hypotension.  RENAL A:   Acute renal failure - presumed ATN in setting dehydration, ARB, NSAIDs, shock. Started CRRT 04/03/16 P:   Nephrology following, appreciate input   Trend BMP / urinary output Replace electrolytes as indicated Avoid nephrotoxic agents, ensure adequate renal perfusion Decreased CRRT volume negative from 100 to 50 given hypotension.  GASTROINTESTINAL A:   GIB - severe esophagitis on EGD without acute bleed so no interventions performed. Pancreatitis - per CT but lipase / amylase normal, suspect chronic . H/o Barrett's esophagus. Constipation P:   Continue TF per Nutrition  Bowel regimen >  senokot-S + miralax, PRN dulcolax suppository PPI BID  Stool OB per RN to collect  HEMATOLOGIC A:   ABLA secondary to GIB, some continued blood loss, no hematemesis, no melena yet Anemia of Critical Illness  P:  Trend CBC  Transfuse for Hgb <7% or active bleeding  Hgb 6.9 this am, getting 1 uRBC.   INFECTIOUS A:   Aspiration PNA, LLL Yeast resp cx, unclear significance  S. Viridans 1 of 2 blood cx, repeat negative so far P:   Off abx, monitor clinically  Cultures as above  ENDOCRINE A:   Hyperglycemia. P:   SSI  NEUROLOGIC A:   Acute Metabolic Encephalopathy - multifactorial in the setting of withdrawal, uremia, hepatic, sedation. At risk ETOH/benzo withdrawal. Paralytics for Vent Dyssynchrony  P:  Continue fentanyl / versed gtt's for sedation  D/c Nimbex 4/2, monitor for vent dyssynchrony  Monitor off PO meds > klonopin, seroquel  Order EEG today  FAMILY  - Updates: No family at bedside.  Will attempt to reach via phone > Enrique Sack [phone] to arrange for group meeting as there have been differing family opinions regarding patients plan of care.  (DNR reversed)  Attending Note:  56 year old male with PMH of etoh abuse, now presenting with PNA, acute renal failure with CRRT.  Met with family yesterday, family meeting upon arrival of aunt on 32/5.  On exam, coarse BS diffusely.  I reviewed CXR myself, ETT in good position.  Hold off pressors for now.  Continue support.  Family meeting as above to determine if patient will get trach/peg vs withdrawal.  Will recommend withdrawal given overall functional status and duration of illness.    The patient is critically ill with multiple organ systems failure and requires high complexity decision making for assessment and support, frequent evaluation and  titration of therapies, application of advanced monitoring technologies and extensive interpretation of multiple databases.   Critical Care Time devoted to patient care services  described in this note is  35  Minutes. This time reflects time of care of this signee Dr Jennet Maduro. This critical care time does not reflect procedure time, or teaching time or supervisory time of PA/NP/Med student/Med Resident etc but could involve care discussion time.  Rush Farmer, M.D. [facility] Pulmonary/Critical Care Medicine. Pager: [phone]. After hours pager: [phone].

## 2016-04-07 NOTE — Consult Note (Signed)
Neurology Consult Note  Reason for Consultation: ? Possible anoxic brain injury  Requesting provider: Jennet Maduro, MD  CC: Unable to obtain as the patient is intubated and sedated  HPI: This is a 56 year old man who initially presented to the emergency department on 03/20/2016 for evaluation of altered mental status. History is obtained from review of the medical records as the patient is unable to provide because of his clinical condition. No family is present at the time of my visit.  According to notes, the patient was found down at his home on 3/19 by his daughter. She had indicated that she first noticed he was acting unusually on 3/17, stating that he was slurring his words and "speaking gibberish" on the telephone. She tried to reach him again on 3/19 but he did not answer the phone. She went to check on him and found him lying in his own stool and vomit. She called 911 and he was transported to the emergency department where he was hypotensive with severe lactic acidosis and acute kidney failure. CT scans of the head and abdomen were obtained and reportedly unrevealing. While in CT, he had projectile vomiting of coffee ground emesis. He was admitted to the ICU by PCCM and was initially resuscitated with aggressive IV fluid administration and a bicarbonate infusion. He was found have acute pancreatitis. He also was noted to have acute blood loss anemia for which he was transfused 1 unit of PRBCs. He was encephalopathic on presentation, confused but able to follow commands. It was felt that this was representative of a multifactorial metabolic encephalopathy due to possible alcohol/benzodiazepine withdrawal, uremia, and hepatic dysfunction. Renal consultation was obtained due to his acute renal failure. Their impression was that his kidney dysfunction was due to his hypotension and they recommended ongoing volume repletion and monitoring.  After his arrival in the intensive care unit, he vomited  bright red blood. He was seen by the covering PCCM physician who noted him to be more confused than earlier. An NG tube was placed with 2 L of bloody fluid removed. According to nursing notes, he then started to heave and developed some bradycardia followed by PEA arrest. ACLS was initiated with 6-7 minutes of CPR with and 1 mg of epinephrine administered prior to return to spontaneous circulation. He required 3 pressors at high doses to maintain blood pressure, though remained hemodynamically unstable even with these infusions. He was rapidly transfused with 2 units of packed red blood cells and placed on Protonix and octreotide drips. Emergent GI consultation was obtained and urgent EGD was performed at the bedside. EGD showed severe erosive ulcerative esophagitis with old blood in the stomach, no active bleeding. According to notes from 03/24/16, nursing documented that he was able to follow commands on his wakeup assessment but became agitated requiring sedation. His renal function and other labs gradually improved.   On 03/25/16, he developed progressive hypoxemia with oxygen saturations as low as 80%. He was placed on bronchodilators with decreases in PEEP and rate on the ventilator as well as repositioning to improve recruitment with resultant improvement in oxygen saturations to the low 90s. Chest x-ray showed increased basilar infiltrates concerning for acute lung injury versus edema, ultimately diagnosed with ARDS. He continued to have agitation and some purposeful movements when sedation was decreased. He demonstrated increasing infiltrates on x-rays associated with significant coughing which resulted in hypotension. This necessitated a change of his sedation to propofol on 03/29/16. He was maintained on Zosyn to cover presumed aspiration  pneumonia. On 03/30/16, his nurse was able to slowly wean the patient off of sedation over the course of the day. Off of sedation the patient was able to open his eyes,  follow commands, nod appropriately, and move all extremities. However, he then developed some tachycardia and hypertension and was bucking the ventilator so he was again sedated. Attempts were made to switch from propofol to Precedex but he continued to be extremely agitated resulting in desaturations so propofol was resumed. He continued to have episodic desaturations into the 80s. Bronchoscopy was performed on 04/04/16 and this showed no mucus plugging.   Due to persistent renal dysfunction and acidosis, he was placed on CRRT on 3/30. On 3/30, he was noted to have persistent hypotension ( SBP 60s, MAP 40s) despite being on maximum doses of norepinephrine and vasopressin drips and Neo-Synephrine was added. On the evening of 3/30, he developed worsening hypoxia on the ventilator for which he was given vecuronium. He was difficult to bag on the ventilator. During suctioning, the respiratory therapist obtained purulent secretions but this did not improve ventilation. The patient then developed bradycardia and cardiac arrest. ACLS was initiated with CPR for about 2 minutes and 1 dose of epinephrine before return of spontaneous circulation. With continued suctioning, a large amount of purulent mucus was suctioned from his airway resulting in improved oxygenation and ventilation. Bronchoscopy was performed after ROSC and showed no obstructive lesions. After this he was noted to have ventilator dyssynchrony and was placed on a Nimbex infusion. He again had a drop in blood pressure and oxygen saturation and was found to have plugging of his endotracheal tube. Once this was cleared blood pressure and saturations improved once more. He was available to come off of pressors on 04/05/16. He was maintained on paralytics until 04/06/16.  After all of this, there has not been some concern that he may have experienced some anoxic brain injury and neurology consultation is now requested. Of note, the primary team spoke with the  patient's daughters yesterday. They indicated that they would not pursue tracheostomy and PEG tube placement at this time. They plan to have a meeting on 04-15-16 and if he does not demonstrate any improvement by that time it appears that they would consider withdrawal of life sustaining measures.  PMH:  Past Medical History:  Diagnosis Date  . Arthritis   . Asthma   . Barrett's esophagus 06/2009  . Blood loss anemia 03/2016   transfused PRBC x 4 in setting of UGIB due to ulcerative esophagitis.   . Colon polyps 03/2015   diminutive tubular adenomas x 4, diverticulosis on screening colonoscopy.   Marland Kitchen COPD (chronic obstructive pulmonary disease) ([place])   . Depression with anxiety   . Diabetes mellitus, type 2 ([place]) 03/2016  . DVT (deep venous thrombosis) ([place])   . GERD (gastroesophageal reflux disease)   . Hiatal Hernia 06/22/2013  . Hypertension   . Hypocalcemia 03/2016  . Hypokalemia 03/2016  . Hyponatremia 03/2016  . Seizures ([place])    STATES HE THINKS THIS IS WRONG.- last seizure 12-2013  . Sleep apnea    YRS AGO---CAME OUT POSITIVE, BUT DOESN'T WEAR THE MASK  . Substance abuse   . Ulcerative esophagitis 06/22/2013    PSH:  Past Surgical History:  Procedure Laterality Date  . APPENDECTOMY    . BACK SURGERY     L1 L2  . BUNIONECTOMY    . ESOPHAGOGASTRODUODENOSCOPY  08/03/2011   Procedure: ESOPHAGOGASTRODUODENOSCOPY (EGD);  Surgeon: Milus Banister, MD;  Location: [facility] ENDOSCOPY;  Service: Endoscopy;  Laterality: N/A;  . ESOPHAGOGASTRODUODENOSCOPY N/A 01/24/2013   Procedure: ESOPHAGOGASTRODUODENOSCOPY (EGD);  Surgeon: Lafayette Dragon, MD;  Location: [facility] ENDOSCOPY;  Service: Endoscopy;  Laterality: N/A;  . ESOPHAGOGASTRODUODENOSCOPY N/A 06/22/2013   Procedure: ESOPHAGOGASTRODUODENOSCOPY (EGD);  Surgeon: Gatha Mayer, MD;  Location: [facility] ENDOSCOPY;  Service: Endoscopy;  Laterality: N/A;  . ESOPHAGOGASTRODUODENOSCOPY N/A 03/12/2016   Procedure: ESOPHAGOGASTRODUODENOSCOPY (EGD);  Surgeon:  Mauri Pole, MD;  Location: [facility] ENDOSCOPY;  Service: Endoscopy;  Laterality: N/A;  . FINGER SURGERY     lt middle  . HIP SURGERY    . NASAL SINUS SURGERY    . PERFORATED EARDRUM     RIGHT EAR - 2013  . RHINOPLASTY      Family history: Family History  Problem Relation Age of Onset  . Heart disease Father   . Diabetes Maternal Grandfather   . Colon cancer Neg Hx   . Colon polyps Neg Hx     Social history:  Social History   Social History  . Marital status: Divorced    Spouse name: N/A  . Number of children: N/A  . Years of education: N/A   Occupational History  . Not on file.   Social History Main Topics  . Smoking status: Light Tobacco Smoker    Packs/day: 0.25    Years: 30.00    Types: Cigarettes    Last attempt to quit: 07/24/2014  . Smokeless tobacco: Never Used     Comment: occ smokes socially  . Alcohol use 16.8 oz/week    28 Cans of beer per week     Comment: ALCOHOL FREE FOR 7 MTHS   .Marland KitchenMarland KitchenWENT TO [facility] 02/2013 , HAD RELAPSE AND TRIED AGAIN.  . Drug use: Yes     Comment: prescription drug use  . Sexual activity: No   Other Topics Concern  . Not on file   Social History Narrative  . No narrative on file    Current outpatient meds: Medications reviewed and reconciled.  Current Meds  Medication Sig  . albuterol (PROVENTIL HFA;VENTOLIN HFA) 108 (90 BASE) MCG/ACT inhaler Inhale 2 puffs into the lungs every 4 (four) hours as needed for wheezing or shortness of breath (copd).   Marland Kitchen amLODipine (NORVASC) 10 MG tablet Take 10 mg by mouth daily.  Marland Kitchen atorvastatin (LIPITOR) 20 MG tablet Take 20 mg by mouth daily.  . budesonide-formoterol (SYMBICORT) 160-4.5 MCG/ACT inhaler Inhale 2 puffs into the lungs 2 (two) times daily.   . citalopram (CELEXA) 20 MG tablet Take 20 mg by mouth daily.  . clonazePAM (KLONOPIN) 1 MG tablet Take 1 mg by mouth 3 (three) times daily as needed for anxiety.  . cyclobenzaprine (FLEXERIL) 10 MG tablet Take 1 tablet (10 mg total) by  mouth 2 (two) times daily as needed for muscle spasms. (Patient taking differently: Take 10 mg by mouth 3 (three) times daily as needed for muscle spasms. )  . furosemide (LASIX) 20 MG tablet Take 20 mg by mouth daily.  . metoprolol succinate (TOPROL-XL) 100 MG 24 hr tablet Take 100 mg by mouth daily.  . pantoprazole (PROTONIX) 40 MG tablet Take 1 tablet (40 mg total) by mouth 2 (two) times daily.  . potassium chloride SA (K-DUR,KLOR-CON) 20 MEQ tablet Take 20 mEq by mouth daily.  . valsartan (DIOVAN) 320 MG tablet Take 320 mg by mouth daily.  Marland Kitchen zolpidem (AMBIEN) 10 MG tablet Take 15 mg by mouth at bedtime as needed for sleep.  Current inpatient meds: Medications reviewed and reconciled.  Current Facility-Administered Medications  Medication Dose Route Frequency Provider Last Rate Last Dose  . 0.9 %  sodium chloride infusion  250 mL Intravenous PRN Raylene Miyamoto, MD 10 mL/hr at 04/07/16 0200 250 mL at 04/07/16 0200  . acetaminophen (TYLENOL) solution 500 mg  500 mg Oral Q6H PRN Dellia Nims, MD   500 mg at 04/07/16 0528  . acetylcysteine (MUCOMYST) 20 % nebulizer / oral solution 4 mL  4 mL Nebulization TID Donita Brooks, NP   4 mL at 04/06/16 2003  . bisacodyl (DULCOLAX) suppository 10 mg  10 mg Rectal Daily PRN Collene Gobble, MD   10 mg at 04/06/16 1211  . chlorhexidine gluconate (MEDLINE KIT) (PERIDEX) 0.12 % solution 15 mL  15 mL Mouth Rinse BID Jose Shirl Harris, MD   15 mL at 04/06/16 2000  . Chlorhexidine Gluconate Cloth 2 % PADS 6 each  6 each Topical Daily Raylene Miyamoto, MD   6 each at 04/07/16 0600  . feeding supplement (VITAL HIGH PROTEIN) liquid 1,000 mL  1,000 mL Per Tube Q24H Kara Mead V, MD   1,000 mL at 04/06/16 2050  . fentaNYL (SUBLIMAZE) bolus via infusion 50 mcg  50 mcg Intravenous Q1H PRN Donita Brooks, NP      . fentaNYL (SUBLIMAZE) injection 50 mcg  50 mcg Intravenous Once Donita Brooks, NP      . fentaNYL 2553mg in NS 2567m(1068mml)  infusion-PREMIX  25-400 mcg/hr Intravenous Continuous BraDonita BrooksP 35 mL/hr at 04/07/16 0621 350 mcg/hr at 04/07/16 0621  . folic acid (FOLVITE) tablet 1 mg  1 mg Per Tube Daily AliJake Churchsters, RPH   1 mg at 04/06/16 [id number] guaiFENesin (ROBITUSSIN) 100 MG/5ML solution 200 mg  10 mL Per Tube Q8H BraDonita BrooksP   200 mg at 04/07/16 0006  . heparin 10,000 units/ 20 mL infusion syringe  250-3,000 Units/hr CRRT Continuous AlvEstanislado EmmsD 5 mL/hr at 04/07/16 0741 2,500 Units/hr at 04/07/16 0741  . heparin bolus via infusion syringe 1,000 Units  1,000 Units CRRT PRN AlvEstanislado EmmsD   1,000 Units at 04/06/16 [id number] heparin bolus via infusion syringe 5,000 Units  5,000 Units CRRT PRN JamMauricia AreaD      . heparin injection 1,000-6,000 Units  1,000-6,000 Units CRRT PRN AlvEstanislado EmmsD      . insulin aspart (novoLOG) injection 0-15 Units  0-15 Units Subcutaneous Q4H DavWilhelmina McardleD   2 Units at 04/06/16 2010  . ipratropium-albuterol (DUONEB) 0.5-2.5 (3) MG/3ML nebulizer solution 3 mL  3 mL Nebulization Q6H DavWilhelmina McardleD   3 mL at 04/07/16 0141  . MEDLINE mouth rinse  15 mL Mouth Rinse QID JosEl PasoD   15 mL at 04/07/16 0417  . midazolam (VERSED) 50 mg in sodium chloride 0.9 % 50 mL (1 mg/mL) infusion  0-8 mg/hr Intravenous Continuous BraDonita BrooksP 6 mL/hr at 04/07/16 0250 6 mg/hr at 04/07/16 0250  . midazolam (VERSED) bolus via infusion 1-2 mg  1-2 mg Intravenous Q2H PRN BraDonita BrooksP      . ondansetron (ZOFRAN) injection 4 mg  4 mg Intravenous Q6H PRN DanRaylene MiyamotoD      . pantoprazole sodium (PROTONIX) 40 mg/20 mL oral suspension 40 mg  40 mg Per Tube Daily DanRaylene MiyamotoD  40 mg at 04/06/16 0900  . polyethylene glycol (MIRALAX / GLYCOLAX) packet 17 g  17 g Oral Daily Donita Brooks, NP   17 g at 04/06/16 1322  . prismasol BGK 4/2.5 5,000 mL dialysis replacement fluid   CRRT Continuous Estanislado Emms, MD 300 mL/hr at 04/06/16 1618     . prismasol BGK 4/2.5 5,000 mL dialysis replacement fluid   CRRT Continuous Estanislado Emms, MD 200 mL/hr at 04/06/16 1732    . prismasol BGK 4/2.5 5,000 mL dialysis solution   CRRT Continuous Estanislado Emms, MD 2,000 mL/hr at 04/07/16 0310    . senna (SENOKOT) tablet 8.6 mg  8.6 mg Per Tube BID Donita Brooks, NP   8.6 mg at 04/06/16 2113  . sodium chloride 0.9 % primer fluid for CRRT   CRRT PRN Estanislado Emms, MD      . sodium chloride flush (NS) 0.9 % injection 10-40 mL  10-40 mL Intracatheter Q12H Raylene Miyamoto, MD   10 mL at 04/06/16 2102  . sodium chloride flush (NS) 0.9 % injection 10-40 mL  10-40 mL Intracatheter PRN Raylene Miyamoto, MD      . thiamine (VITAMIN B-1) tablet 100 mg  100 mg Per Tube Daily Jake Church Masters, RPH   100 mg at 04/06/16 0900    Allergies: Allergies  Allergen Reactions  . Food Hives    Yogurt     ROS: As per HPI. A full 14-point review of systems could not be obtained as the patient is unable to participate with the exam.   PE:  BP (!) 87/52   Pulse (!) 109   Temp (!) 100.4 F (38 C)   Resp 18   Ht 6' (1.829 m)   Wt 125.3 kg (276 lb 3.8 oz)   SpO2 100%   BMI 37.46 kg/m   General: WD Caucasian man lying in ICU bed. He is presently intubated and sedated with fentanyl and Versed infusions. He opened his eyes during the examination but did not fix or track. He would grimace to pain. He made no attempt to communicate and did not follow commands. HEENT: Normocephalic. Neck supple without LAD. Endotracheal tube in place. Sclerae anicteric. No conjunctival injection.  CV: Regular, no murmur. Carotid pulses full and symmetric, no bruits. Distal pulses 2+ and symmetric.  Lungs: Ventilated. Coarse breath sounds noted on anterior examination.  Abdomen: Soft, non-distended. Bowel sounds present x4.  Extremities: He has 2+ pitting edema diffusely. Neuro:  CN: Pupils are equal and round. They are symmetrically reactive from 3-->2 mm. He appears to  inconsistently blink to threat. His eyes were initially disconjugate but once he opened the exam but became more conjugate. Oculocephalics are intact. Corneals are intact and symmetric. His face appears grossly symmetric at rest and he has a symmetric grimace. The remainder of his cranial nerve examination is limited by the fact that he is unable to participate.  Motor: Normal bulk. Tone is reduced throughout. No spontaneous movements of the extremities was noted. No tremor or other abnormal movements. Sensation: He grimaces to nailbed pressure test for but did not clearly withdraw. DTRs: 2+, symmetric. Toes downgoing bilaterally.  Coordination/gait: These cannot be assessed as the patient is unable to participate with the exam.   Labs:  Lab Results  Component Value Date   WBC 8.4 04/07/2016   HGB 6.5 (LL) 04/07/2016   HCT 21.2 (L) 04/07/2016   PLT 223 04/07/2016   GLUCOSE 125 (H) 04/07/2016  TRIG 416 (H) 04/04/2016   ALT 93 (H) 03/31/2016   AST 100 (H) 03/31/2016   NA 133 (L) 04/07/2016   K 4.4 04/07/2016   CL 100 (L) 04/07/2016   CREATININE 2.93 (H) 04/07/2016   BUN 38 (H) 04/07/2016   CO2 27 04/07/2016   TSH 2.039 10/05/2012   INR 1.08 04/02/2016   Magnesium 2.4 Phosphorous 3.0 HIV nonreactive  Imaging:  I have personally and independently reviewed CT scan of the head without contrast from 03/21/2016. This shows mild diffuse generalized atrophy. Mild chronic small vessel ischemic changes are noted in the bihemispheric white matter. No acute abnormality is seen.   Assessment and Plan:  1. Acute encephalopathy: This is most likely multifactorial in etiology. There has been some concern about the possibility of an anoxic brain injury. While this is certainly possible given that he has suffered two (brief) cardiac arrests during this hospitalization as well as prolonged periods of hypoxemia and hypotension requiring multiple pressors, notes during this admission indicate that he has  been able to open his eyes, follow commands, and even answer questions appropriately when he is off of sedation. This would argue against a severe anoxic brain injury. Other potential contributors to his encephalopathy would include respiratory failure, ARDS, acute renal failure, infection, hepatic dysfunction, medication effect, ICU admission, length of hospital stay, and possible withdrawal from benzodiazepines. Continue to optimize metabolic status as you are. Continue to treat any underlying infection. Minimize the use of opiates, benzos or any medication with strong anticholinergic properties as much as possible.   At this point, I believe that his prognosis is largely driven by his multiple medical issues and that his neurologic status plays a relatively minor part at this time. According to notes, it sounds as if the plan is to consider withdrawal of life sustaining measures on 4/5 if he does not demonstrate any improvement because his daughters feel that he would not want to be maintained on life support or be dependent on caregivers. With that in mind, I think it is a reasonable decision to proceed with comfort measures should he fail to show significant recovery over the next couple of days.  No family was present at the time of consultation.  Thank you for this consultation. At this point I have no additional recommendations and will sign off. Please feel free to call if there are any additional questions or concerns.

## 2016-04-08 ENCOUNTER — Inpatient Hospital Stay (HOSPITAL_COMMUNITY): Payer: Medicare Other

## 2016-04-08 LAB — RENAL FUNCTION PANEL
ANION GAP: 7 (ref 5–15)
ANION GAP: 10 (ref 5–15)
Albumin: 1.3 g/dL — ABNORMAL LOW (ref 3.5–5.0)
Albumin: 1.4 g/dL — ABNORMAL LOW (ref 3.5–5.0)
BUN: 39 mg/dL — ABNORMAL HIGH (ref 6–20)
BUN: 39 mg/dL — ABNORMAL HIGH (ref 6–20)
CHLORIDE: 100 mmol/L — AB (ref 101–111)
CHLORIDE: 95 mmol/L — AB (ref 101–111)
CO2: 27 mmol/L (ref 22–32)
CO2: 32 mmol/L (ref 22–32)
Calcium: 8.4 mg/dL — ABNORMAL LOW (ref 8.9–10.3)
Calcium: 8.8 mg/dL — ABNORMAL LOW (ref 8.9–10.3)
Creatinine, Ser: 2.77 mg/dL — ABNORMAL HIGH (ref 0.61–1.24)
Creatinine, Ser: 2.94 mg/dL — ABNORMAL HIGH (ref 0.61–1.24)
GFR calc Af Amer: 28 mL/min — ABNORMAL LOW (ref >60)
GFR calc Af Amer: 26 mL/min — ABNORMAL LOW (ref >60)
GFR calc non Af Amer: 24 mL/min — ABNORMAL LOW (ref >60)
GFR calc non Af Amer: 23 mL/min — ABNORMAL LOW (ref >60)
GLUCOSE: 194 mg/dL — AB (ref 65–99)
GLUCOSE: 167 mg/dL — AB (ref 65–99)
PHOSPHORUS: 4.3 mg/dL (ref 2.5–4.6)
POTASSIUM: 4 mmol/L (ref 3.5–5.1)
POTASSIUM: 3.8 mmol/L (ref 3.5–5.1)
Phosphorus: 2.1 mg/dL — ABNORMAL LOW (ref 2.5–4.6)
SODIUM: 134 mmol/L — AB (ref 135–145)
Sodium: 137 mmol/L (ref 135–145)

## 2016-04-08 LAB — POCT I-STAT, CHEM 8
BUN: 29 mg/dL — ABNORMAL HIGH (ref 6–20)
BUN: 38 mg/dL — ABNORMAL HIGH (ref 6–20)
BUN: 31 mg/dL — ABNORMAL HIGH (ref 6–20)
BUN: 30 mg/dL — ABNORMAL HIGH (ref 6–20)
BUN: 37 mg/dL — ABNORMAL HIGH (ref 6–20)
BUN: 31 mg/dL — ABNORMAL HIGH (ref 6–20)
BUN: 37 mg/dL — ABNORMAL HIGH (ref 6–20)
BUN: 31 mg/dL — ABNORMAL HIGH (ref 6–20)
BUN: 37 mg/dL — ABNORMAL HIGH (ref 6–20)
BUN: 26 mg/dL — ABNORMAL HIGH (ref 6–20)
BUN: 38 mg/dL — ABNORMAL HIGH (ref 6–20)
BUN: 23 mg/dL — ABNORMAL HIGH (ref 6–20)
BUN: 28 mg/dL — ABNORMAL HIGH (ref 6–20)
BUN: 37 mg/dL — AB (ref 6–20)
CALCIUM ION: 1.23 mmol/L (ref 1.15–1.40)
CALCIUM ION: 1.19 mmol/L (ref 1.15–1.40)
CHLORIDE: 93 mmol/L — AB (ref 101–111)
CHLORIDE: 93 mmol/L — AB (ref 101–111)
CREATININE: 3.2 mg/dL — AB (ref 0.61–1.24)
CREATININE: 3.1 mg/dL — AB (ref 0.61–1.24)
Calcium, Ion: 0.67 mmol/L — CL (ref 1.15–1.40)
Calcium, Ion: 1.19 mmol/L (ref 1.15–1.40)
Calcium, Ion: 0.64 mmol/L — CL (ref 1.15–1.40)
Calcium, Ion: 0.61 mmol/L — CL (ref 1.15–1.40)
Calcium, Ion: 0.52 mmol/L — CL (ref 1.15–1.40)
Calcium, Ion: 1.21 mmol/L (ref 1.15–1.40)
Calcium, Ion: 0.65 mmol/L — CL (ref 1.15–1.40)
Calcium, Ion: 1.14 mmol/L — ABNORMAL LOW (ref 1.15–1.40)
Calcium, Ion: 0.44 mmol/L — CL (ref 1.15–1.40)
Calcium, Ion: 1.14 mmol/L — ABNORMAL LOW (ref 1.15–1.40)
Calcium, Ion: 0.4 mmol/L — CL (ref 1.15–1.40)
Calcium, Ion: 0.48 mmol/L — CL (ref 1.15–1.40)
Chloride: 100 mmol/L — ABNORMAL LOW (ref 101–111)
Chloride: 97 mmol/L — ABNORMAL LOW (ref 101–111)
Chloride: 95 mmol/L — ABNORMAL LOW (ref 101–111)
Chloride: 98 mmol/L — ABNORMAL LOW (ref 101–111)
Chloride: 92 mmol/L — ABNORMAL LOW (ref 101–111)
Chloride: 98 mmol/L — ABNORMAL LOW (ref 101–111)
Chloride: 93 mmol/L — ABNORMAL LOW (ref 101–111)
Chloride: 96 mmol/L — ABNORMAL LOW (ref 101–111)
Chloride: 93 mmol/L — ABNORMAL LOW (ref 101–111)
Chloride: 94 mmol/L — ABNORMAL LOW (ref 101–111)
Chloride: 91 mmol/L — ABNORMAL LOW (ref 101–111)
Chloride: 91 mmol/L — ABNORMAL LOW (ref 101–111)
Creatinine, Ser: 2.1 mg/dL — ABNORMAL HIGH (ref 0.61–1.24)
Creatinine, Ser: 3 mg/dL — ABNORMAL HIGH (ref 0.61–1.24)
Creatinine, Ser: 2.2 mg/dL — ABNORMAL HIGH (ref 0.61–1.24)
Creatinine, Ser: 2.1 mg/dL — ABNORMAL HIGH (ref 0.61–1.24)
Creatinine, Ser: 3.1 mg/dL — ABNORMAL HIGH (ref 0.61–1.24)
Creatinine, Ser: 2.5 mg/dL — ABNORMAL HIGH (ref 0.61–1.24)
Creatinine, Ser: 2.4 mg/dL — ABNORMAL HIGH (ref 0.61–1.24)
Creatinine, Ser: 3.2 mg/dL — ABNORMAL HIGH (ref 0.61–1.24)
Creatinine, Ser: 1.7 mg/dL — ABNORMAL HIGH (ref 0.61–1.24)
Creatinine, Ser: 1.4 mg/dL — ABNORMAL HIGH (ref 0.61–1.24)
Creatinine, Ser: 2.1 mg/dL — ABNORMAL HIGH (ref 0.61–1.24)
Creatinine, Ser: 3.1 mg/dL — ABNORMAL HIGH (ref 0.61–1.24)
GLUCOSE: 165 mg/dL — AB (ref 65–99)
Glucose, Bld: 176 mg/dL — ABNORMAL HIGH (ref 65–99)
Glucose, Bld: 210 mg/dL — ABNORMAL HIGH (ref 65–99)
Glucose, Bld: 229 mg/dL — ABNORMAL HIGH (ref 65–99)
Glucose, Bld: 204 mg/dL — ABNORMAL HIGH (ref 65–99)
Glucose, Bld: 244 mg/dL — ABNORMAL HIGH (ref 65–99)
Glucose, Bld: 259 mg/dL — ABNORMAL HIGH (ref 65–99)
Glucose, Bld: 212 mg/dL — ABNORMAL HIGH (ref 65–99)
Glucose, Bld: 221 mg/dL — ABNORMAL HIGH (ref 65–99)
Glucose, Bld: 243 mg/dL — ABNORMAL HIGH (ref 65–99)
Glucose, Bld: 197 mg/dL — ABNORMAL HIGH (ref 65–99)
Glucose, Bld: 240 mg/dL — ABNORMAL HIGH (ref 65–99)
Glucose, Bld: 240 mg/dL — ABNORMAL HIGH (ref 65–99)
Glucose, Bld: 217 mg/dL — ABNORMAL HIGH (ref 65–99)
HCT: 22 % — ABNORMAL LOW (ref 39.0–52.0)
HCT: 23 % — ABNORMAL LOW (ref 39.0–52.0)
HCT: 23 % — ABNORMAL LOW (ref 39.0–52.0)
HCT: 23 % — ABNORMAL LOW (ref 39.0–52.0)
HCT: 23 % — ABNORMAL LOW (ref 39.0–52.0)
HCT: 25 % — ABNORMAL LOW (ref 39.0–52.0)
HCT: 25 % — ABNORMAL LOW (ref 39.0–52.0)
HCT: 26 % — ABNORMAL LOW (ref 39.0–52.0)
HCT: 28 % — ABNORMAL LOW (ref 39.0–52.0)
HCT: 31 % — ABNORMAL LOW (ref 39.0–52.0)
HCT: 25 % — ABNORMAL LOW (ref 39.0–52.0)
HCT: 26 % — ABNORMAL LOW (ref 39.0–52.0)
HEMATOCRIT: 21 % — AB (ref 39.0–52.0)
HEMATOCRIT: 25 % — AB (ref 39.0–52.0)
HEMOGLOBIN: 7.8 g/dL — AB (ref 13.0–17.0)
HEMOGLOBIN: 7.1 g/dL — AB (ref 13.0–17.0)
HEMOGLOBIN: 8.5 g/dL — AB (ref 13.0–17.0)
Hemoglobin: 7.5 g/dL — ABNORMAL LOW (ref 13.0–17.0)
Hemoglobin: 7.8 g/dL — ABNORMAL LOW (ref 13.0–17.0)
Hemoglobin: 7.8 g/dL — ABNORMAL LOW (ref 13.0–17.0)
Hemoglobin: 7.8 g/dL — ABNORMAL LOW (ref 13.0–17.0)
Hemoglobin: 8.5 g/dL — ABNORMAL LOW (ref 13.0–17.0)
Hemoglobin: 8.5 g/dL — ABNORMAL LOW (ref 13.0–17.0)
Hemoglobin: 8.8 g/dL — ABNORMAL LOW (ref 13.0–17.0)
Hemoglobin: 9.5 g/dL — ABNORMAL LOW (ref 13.0–17.0)
Hemoglobin: 10.5 g/dL — ABNORMAL LOW (ref 13.0–17.0)
Hemoglobin: 8.5 g/dL — ABNORMAL LOW (ref 13.0–17.0)
Hemoglobin: 8.8 g/dL — ABNORMAL LOW (ref 13.0–17.0)
POTASSIUM: 4 mmol/L (ref 3.5–5.1)
POTASSIUM: 3.9 mmol/L (ref 3.5–5.1)
Potassium: 4.1 mmol/L (ref 3.5–5.1)
Potassium: 4.3 mmol/L (ref 3.5–5.1)
Potassium: 4.1 mmol/L (ref 3.5–5.1)
Potassium: 4.2 mmol/L (ref 3.5–5.1)
Potassium: 4 mmol/L (ref 3.5–5.1)
Potassium: 4.1 mmol/L (ref 3.5–5.1)
Potassium: 3.9 mmol/L (ref 3.5–5.1)
Potassium: 4 mmol/L (ref 3.5–5.1)
Potassium: 3.8 mmol/L (ref 3.5–5.1)
Potassium: 3.7 mmol/L (ref 3.5–5.1)
Potassium: 3.7 mmol/L (ref 3.5–5.1)
Potassium: 3.9 mmol/L (ref 3.5–5.1)
SODIUM: 136 mmol/L (ref 135–145)
SODIUM: 139 mmol/L (ref 135–145)
SODIUM: 136 mmol/L (ref 135–145)
SODIUM: 137 mmol/L (ref 135–145)
Sodium: 137 mmol/L (ref 135–145)
Sodium: 139 mmol/L (ref 135–145)
Sodium: 139 mmol/L (ref 135–145)
Sodium: 138 mmol/L (ref 135–145)
Sodium: 136 mmol/L (ref 135–145)
Sodium: 138 mmol/L (ref 135–145)
Sodium: 139 mmol/L (ref 135–145)
Sodium: 140 mmol/L (ref 135–145)
Sodium: 138 mmol/L (ref 135–145)
Sodium: 139 mmol/L (ref 135–145)
TCO2: 26 mmol/L (ref 0–100)
TCO2: 27 mmol/L (ref 0–100)
TCO2: 28 mmol/L (ref 0–100)
TCO2: 28 mmol/L (ref 0–100)
TCO2: 28 mmol/L (ref 0–100)
TCO2: 30 mmol/L (ref 0–100)
TCO2: 30 mmol/L (ref 0–100)
TCO2: 30 mmol/L (ref 0–100)
TCO2: 31 mmol/L (ref 0–100)
TCO2: 34 mmol/L (ref 0–100)
TCO2: 35 mmol/L (ref 0–100)
TCO2: 32 mmol/L (ref 0–100)
TCO2: 33 mmol/L (ref 0–100)
TCO2: 34 mmol/L (ref 0–100)

## 2016-04-08 LAB — POCT I-STAT 3, ART BLOOD GAS (G3+)
ACID-BASE EXCESS: 10 mmol/L — AB (ref 0.0–2.0)
Bicarbonate: 34.5 mmol/L — ABNORMAL HIGH (ref 20.0–28.0)
O2 SAT: 92 %
PCO2 ART: 45.2 mmHg (ref 32.0–48.0)
PH ART: 7.493 — AB (ref 7.350–7.450)
PO2 ART: 61 mmHg — AB (ref 83.0–108.0)
Patient temperature: 99.7
TCO2: 36 mmol/L (ref 0–100)

## 2016-04-08 LAB — BLOOD GAS, ARTERIAL
Acid-Base Excess: 3.8 mmol/L — ABNORMAL HIGH (ref 0.0–2.0)
Bicarbonate: 27.8 mmol/L (ref 20.0–28.0)
Drawn by: 36277
FIO2: 50
O2 Saturation: 97.4 %
PEEP: 10 cmH2O
Patient temperature: 101.6
Pressure control: 30 cmH2O
RATE: 18 resp/min
pCO2 arterial: 45.3 mmHg (ref 32.0–48.0)
pH, Arterial: 7.413 (ref 7.350–7.450)
pO2, Arterial: 101 mmHg (ref 83.0–108.0)

## 2016-04-08 LAB — GLUCOSE, CAPILLARY
GLUCOSE-CAPILLARY: 202 mg/dL — AB (ref 65–99)
GLUCOSE-CAPILLARY: 170 mg/dL — AB (ref 65–99)
Glucose-Capillary: 180 mg/dL — ABNORMAL HIGH (ref 65–99)
Glucose-Capillary: 222 mg/dL — ABNORMAL HIGH (ref 65–99)
Glucose-Capillary: 149 mg/dL — ABNORMAL HIGH (ref 65–99)

## 2016-04-08 LAB — CBC WITH DIFFERENTIAL/PLATELET
Basophils Absolute: 0.1 10*3/uL (ref 0.0–0.1)
Basophils Relative: 1 %
Eosinophils Absolute: 0.3 10*3/uL (ref 0.0–0.7)
Eosinophils Relative: 3 %
HCT: 21.8 % — ABNORMAL LOW (ref 39.0–52.0)
Hemoglobin: 6.8 g/dL — CL (ref 13.0–17.0)
Lymphocytes Relative: 13 %
Lymphs Abs: 1.3 10*3/uL (ref 0.7–4.0)
MCH: 30.1 pg (ref 26.0–34.0)
MCHC: 31.2 g/dL (ref 30.0–36.0)
MCV: 96.5 fL (ref 78.0–100.0)
Monocytes Absolute: 0.8 10*3/uL (ref 0.1–1.0)
Monocytes Relative: 8 %
Neutro Abs: 7.6 10*3/uL (ref 1.7–7.7)
Neutrophils Relative %: 76 %
Platelets: 230 10*3/uL (ref 150–400)
RBC: 2.26 MIL/uL — ABNORMAL LOW (ref 4.22–5.81)
RDW: 17.4 % — ABNORMAL HIGH (ref 11.5–15.5)
WBC: 10 10*3/uL (ref 4.0–10.5)

## 2016-04-08 LAB — PREPARE RBC (CROSSMATCH)

## 2016-04-08 LAB — MAGNESIUM: Magnesium: 2.4 mg/dL (ref 1.7–2.4)

## 2016-04-08 LAB — CBC
HCT: 23.5 % — ABNORMAL LOW (ref 39.0–52.0)
Hemoglobin: 7.3 g/dL — ABNORMAL LOW (ref 13.0–17.0)
MCH: 29.3 pg (ref 26.0–34.0)
MCHC: 31.1 g/dL (ref 30.0–36.0)
MCV: 94.4 fL (ref 78.0–100.0)
Platelets: 235 K/uL (ref 150–400)
RBC: 2.49 MIL/uL — ABNORMAL LOW (ref 4.22–5.81)
RDW: 18.5 % — ABNORMAL HIGH (ref 11.5–15.5)
WBC: 10.8 K/uL — ABNORMAL HIGH (ref 4.0–10.5)

## 2016-04-08 LAB — APTT: APTT: 32 s (ref 24–36)

## 2016-04-08 MED ORDER — SODIUM CHLORIDE 0.9 % IV BOLUS (SEPSIS): 250.0000 mL | Freq: Once | INTRAVENOUS | Status: DC

## 2016-04-08 MED ORDER — PRISMASOL B22GK 4/0 22-4 MEQ/L IV SOLN
INTRAVENOUS | Status: DC
  Filled 2016-04-08 (×3): qty 5000

## 2016-04-08 MED ORDER — SODIUM PHOSPHATES 45 MMOLE/15ML IV SOLN
30.0000 mmol | Freq: Once | INTRAVENOUS | Status: AC
  Administered 2016-04-08: 30 mmol via INTRAVENOUS
  Filled 2016-04-08: qty 10

## 2016-04-08 MED ORDER — PRISMASOL B22GK 4/0 22-4 MEQ/L IV SOLN
INTRAVENOUS | Status: DC
  Administered 2016-04-08: 08:00:00 via INTRAVENOUS_CENTRAL
  Filled 2016-04-08 (×2): qty 5000

## 2016-04-08 MED ORDER — ACETAMINOPHEN 160 MG/5ML PO SOLN
650.0000 mg | Freq: Once | ORAL | Status: AC
  Administered 2016-04-08: 650 mg via ORAL
  Filled 2016-04-08: qty 20.3

## 2016-04-08 MED ORDER — SODIUM CHLORIDE 0.9 % IV SOLN
Freq: Once | INTRAVENOUS | Status: AC
  Administered 2016-04-08: 07:00:00 via INTRAVENOUS

## 2016-04-08 NOTE — Progress Notes (Signed)
Around 1150, it was discovered that the cortrak tube was coiled in the back of the patient's mouth. Tube feeds were immediately stopped. Obtained KUB. Radiologist said to advance the tube before restarting feedings. Spoke with Dr. Nelda Marseille about fixing the cortrak. The MD stated to remove the cortrak and hold tube feeds for now pending family meeting tomorrow 12-Apr-2016.

## 2016-04-08 NOTE — Progress Notes (Addendum)
PULMONARY / CRITICAL CARE MEDICINE   Name: Jesse Hartman MRN: [id number] DOB: October 05, 1960    ADMISSION DATE:  03/24/2016 CONSULTATION DATE:  03/10/2016  REFERRING MD:  Dr. Vanita Panda  CHIEF COMPLAINT:  AMS  HISTORY OF PRESENT ILLNESS:   56 year old male with PMH of ETOH abuse (24 pack per day), COPD, Barrett's esophagus, HTN, Seizures, and OSA. He was started on lasix and potassium supplementation approximately 3/12. He was found down by his daughter 3/19 and brought to ED via EMS,hypotensive, with AKI & hyperkalemia , cr 4.2  While in CT he had episode of projectile vomiting of coffee ground emesis. Despite IVF resuscitation he remained hypotensive . 3/19 PM cardiac arrest 6 mins, intubated. On pressors.   STUDIES:  CT head 3/19 > No evidence of acute intracranial abnormality. Generalized cerebral volume loss. Mild paranasal sinusitis of uncertain acuity . CT abd/pelv 3/19 > Stranding around the pancreas compatible with acute pancreatitis. Fatty infiltration of the liver. Distended, fluid-filled stomach. EGD 3/19 > Severe esophagitis with no acute hemorrhage CXR 4/4 > neg for pleural effusion, pulmonary infiltrates unchanged.   CULTURES: Blood 3/19 >> 1 /2  S viridans Urine 3/19 > insignificant growth Resp 3/21 >> abundant yeast, PMNs >> reincubated >>  Few yeast Blood 3/21 >> ng Urine 3/21 >> negative Blood cultures 4/4 >>  LINES/TUBES: CVL 3/19 > ETT 3/19 > L fem art line 3/21 >3/28   ANTIBIOTICS: vanco 3/19 >> 3/20 vanco 3/22 >> 3/23 Zosyn 3/19 >3/29 Cefepime>>3/30>3/31  SIGNIFICANT EVENTS: 3/19 admit, ARF 3/19 PM cardiac arrest 6 mins, intubated. On pressors.  3/23 Increased infiltrates over the last 24 hours on chest x-ray ? aspiration Changed to propofol on 3/24 3/25  significant cough that causes auto PEEP and subsequently hypotension 3/29 - Remains Critically ill, intubated & sedated. Continues to have asynchrony  on vent On high dose levo gtt.Good UO  3/30 - los  11\ days. On levophed 76mcg. fio2 70%, peep 5 and sedated. On bic gtt. Makes urine but rising creat. Per RN - was on CRRT for a week and has been off few to several days  04/04/16 - overnight brief cpr. On CRRT . Nimbex +. On 2 pressors. fio2 60%. Daughter Adrea at bedsdie = says dad might not want to go trhough all this and thinking terminal wean. She is agreeable to DNR as first step but needs to talk to elder sister/daugghter    SUBJECTIVE/OVERNIGHT/INTERVAL HX hgb 6.8 this morning, started on 1uRBC.Marland Kitchen Febrile up to 101.30F last night. Following commands this morning.   VITAL SIGNS: BP 105/68   Pulse (!) 110   Temp (!) 101.9 F (38.8 C) (Oral)   Resp 18   Ht 6' (1.829 m)   Wt 274 lb 4 oz (124.4 kg)   SpO2 100%   BMI 37.20 kg/m   HEMODYNAMICS: CVP:  [8 mmHg] 8 mmHg  VENTILATOR SETTINGS: Vent Mode: PCV FiO2 (%):  [50 %] 50 % Set Rate:  [15 bmp-18 bmp] 18 bmp PEEP:  [10 cmH20] 10 cmH20 Plateau Pressure:  [29 cmH20-32 cmH20] 31 cmH20  INTAKE / OUTPUT: I/O last 3 completed shifts: In: 3883.9 [I.V.:1410.3; Blood:363.7; NG/GT:2110] Out: 6152 [Other:6152]  PHYSICAL EXAMINATION:  Gen: sedated, NAD, well nourished Lungs: CTAB, no wheezing Cardiac:RRR, no murmurs Abd: soft, active bowel sounds, non TTP Neuro: RASS -1 Skin: no open skin breaks, warm and dry, left central line non erythematous   LABS: PULMONARY  Recent Labs Lab 04/03/16 1445 04/03/16 2305 04/04/16 0255 04/06/16  1628 04/08/16 0157 04/08/16 0201 04/08/16 0350 04/08/16 0351 04/08/16 0604  PHART 7.117* 7.118* 7.366 7.298*  --   --  7.413  --   --   PCO2ART ABOVE REPORTABLE RANGE ABOVE REPORTABLE RANGE 64.0* 59.4*  --   --  45.3  --   --   PO2ART 81.2* 88.0 238* 65.0*  --   --  101  --   --   HCO3 37.7* 39.8* 35.7* 29.1*  --   --  27.8  --   --   TCO2  --   --   --  31 27 26   --  28 28  O2SAT 92.6 95.4 99.6 90.0  --   --  97.4  --   --    CBC  Recent Labs Lab 04/07/16 0428 04/07/16 0953   04/08/16 0351 04/08/16 0400 04/08/16 0604  HGB 6.5* 7.4*  < > 7.8* 6.8* 7.8*  HCT 21.2* 23.5*  < > 23.0* 21.8* 23.0*  WBC 8.4 9.7  --   --  10.0  --   PLT 223 248  --   --  230  --   < > = values in this interval not displayed.  COAGULATION  Recent Labs Lab 04/02/16 1412  INR 1.08   CHEMISTRY  Recent Labs Lab 04/04/16 0350  04/05/16 0435  04/06/16 0426 04/06/16 1617 04/07/16 0428 04/07/16 1512 04/08/16 0157 04/08/16 0201 04/08/16 0351 04/08/16 0400 04/08/16 0604  NA  --   < > 137  < > 137 135 133* 134* 139 137 139 134* 139  K  --   < > 4.0  < > 4.5 5.0 4.4 4.5 4.1 4.3 4.1 4.0 4.0  CL  --   < > 95*  < > 98* 98* 100* 101 97* 100* 95* 100* 93*  CO2  --   < > 32  < > 31 30 27 27   --   --   --  27  --   GLUCOSE  --   < > 152*  < > 139* 129* 125* 131* 210* 176* 229* 194* 244*  BUN  --   < > 41*  < > 38* 35* 38* 38* 29* 38* 31* 39* 30*  CREATININE  --   < > 2.94*  < > 2.73* 2.69* 2.93* 2.82* 2.10* 3.00* 2.20* 2.77* 2.10*  CALCIUM  --   < > 8.0*  < > 8.4* 8.5* 8.2* 8.2*  --   --   --  8.4*  --   MG 2.2  --  2.4  --  2.5*  --  2.4  --   --   --   --  2.4  --   PHOS 6.1*  < > 4.9*  4.9*  < > 4.4 4.3 2.9  3.0 2.9  --   --   --  2.1*  --   < > = values in this interval not displayed. Estimated Creatinine Clearance: 54.8 mL/min (A) (by C-G formula based on SCr of 2.1 mg/dL (H)).  LIVER  Recent Labs Lab 04/02/16 1412  04/06/16 0426 04/06/16 1617 04/07/16 0428 04/07/16 1512 04/08/16 0400  ALBUMIN  --   < > 1.4* 1.5* 1.4* 1.4* 1.3*  INR 1.08  --   --   --   --   --   --   < > = values in this interval not displayed.  INFECTIOUS No results for input(s): LATICACIDVEN, PROCALCITON in the last 168 hours.  ENDOCRINE CBG (last  3)   Recent Labs  04/07/16 1940 04/07/16 2332 04/08/16 0330  GLUCAP 150* 141* 180*   IMAGING x48h  - image(s) personally visualized  -   highlighted in bold Dg Chest Port 1 View  Result Date: 04/08/2016 CLINICAL DATA:  Intubation. EXAM:  PORTABLE CHEST 1 VIEW COMPARISON:  04/07/2016 . FINDINGS: Endotracheal tube, feeding tube, left IJ line in stable position . Heart size normal. Diffuse bilateral nodular pulmonary infiltrates are again noted. No significant change. Improvement of left base atelectasis. No pleural effusion or pneumothorax. IMPRESSION: 1. Lines and tubes in stable position. 2. Diffuse bilateral pulmonary infiltrates are noted . These appear unchanged. Interim improvement of left base atelectasis . No pleural effusion noted on today's exam . Electronically Signed   By: Marcello Moores  Register   On: 04/08/2016 06:51   Dg Chest Port 1 View  Result Date: 04/07/2016 CLINICAL DATA:  Respiratory failure. EXAM: PORTABLE CHEST 1 VIEW COMPARISON:  04/06/2016. FINDINGS: Endotracheal tube, left IJ line, feeding tube in stable position. Stable cardiomegaly. Diffuse bilateral pulmonary infiltrates again noted without significant change. Left lower lobe atelectasis. Small left pleural effusion. IMPRESSION: 1. Lines and tubes in stable position. 2. Diffuse bilateral pulmonary infiltrates again noted without significant change. Left lower lobe atelectasis. Small left pleural effusion. Electronically Signed   By: Marcello Moores  Register   On: 04/07/2016 06:39   DISCUSSION: 56 year old male admitted with AMS and AKI. Was improving but suffered massive coffee ground emesis and subsequent brief cardiac arrest. Developed ARDS & shock. Mental status intact but hypoxia very slow to improve  ASSESSMENT / PLAN:  PULMONARY A: Acute hypoxemic respiratory failure: suspect he aspirated on emesis, CXR and exam also concerning ALI vs edema. COPD without acute exacerbation. OSA. BL PNA -favor Aspiration  - 4/2 ARDS with 60% fio2, peep 8 and CXR unchanged from yesterday  P:   PCV vent, continue full vent support. Scheduled bronchodilators. Off nimbex. Family meeting in AM for comfort care. CXR with unchanged pulmonary infiltates, attempted to wean off  sedation today  CARDIOVASCULAR A:  Shock: hemorrhagic, +/- septic  H/o HTN, CHF. -septic shock resolved  04/05/16  P:  MAP goal > 65 Goal CVP 10-12 range  RENAL A:   Acute renal failure - presumed ATN in setting dehydration/ARB/NSAIDs/shock. STrated CRRT 04/03/16 4/1/1 - on CRRT. ANuric  P:   Per renal CRRT negative, likely d/c in AM for withdraw -50 ml/hr.  GASTROINTESTINAL A:   GIB - severe esophagitis on EGD. No acute bleed so no interventions performed. Acute pancreatitis - per CT but lipase / amylase normal, suspect more chronic . H/o Barrett's esophagus. Constipation P:   Tube feeding Protonix per tube daily BM yesterday that was FOBT positive, hep stopped, hgb 6.8 this am that went uip to 7.8 s/p 1 uRBC transfusion, hold transfusion given likely comfort care in AM.  HEMATOLOGIC A:   ABLA secondary to GIB, some continued blood loss, no hematemesis, no melena yet; anemia of critical illness P:  Follow CBC Transfusion goal Hgb > 7  INFECTIOUS A:   Aspiration PNA, LLL Yeast resp cx, unclear significance  S. Viridans 1 of 2 blood cx, repeat negative so far   -off pressors 04/05/16   P:   Continue Zosyn until 3/29, then ancef to complete 14 dc if resp improvement, ancef stopped 4/1 for total of 11 days abx Vancomycin stopped on 3/23  ENDOCRINE A:   Hyperglycemia. P:   SSI  NEUROLOGIC A:   Acute metabolic encephalopathy:  multifactorial - withdrawal, uremia, hepatic, sedation. At risk ETOH/benzo withdrawal.  -04/05/16  -deeply sedated and paraluyzed  P   Fent gtt and versed gtt Off nimbex Off  Klonopin Off  diprivan Off  deroquel EEG 4/3 >>severe diffuse cerebral dysfunction that is non-specific in etiology and can be seen in the setting of anoxic/ischemic injury, toxic/metabolic encephalopathies, or medication effect. If clinically indicated, consider repeating study with patient completely off of sedating medications.  FAMILY  - Updates:  RN  contacted family, family meeting at 43 AM 4/5.  - Inter-disciplinary family meet or Palliative Care meeting due by: today  The patient is critically ill with multiple organ systems failure and requires high complexity decision making for assessment and support, frequent evaluation and titration of therapies, application of advanced monitoring technologies and extensive interpretation of multiple databases.   Critical Care Time devoted to patient care services described in this note is  35  Minutes. This time reflects time of care of this signee Dr Jennet Maduro. This critical care time does not reflect procedure time, or teaching time or supervisory time of PA/NP/Med student/Med Resident etc but could involve care discussion time.  Rush Farmer, M.D. [facility] Pulmonary/Critical Care Medicine. Pager: [phone]. After hours pager: [phone].  04/08/2016 7:28 AM

## 2016-04-08 NOTE — Progress Notes (Signed)
Subjective: Interval History:on vent not responsive Objective: Vital signs in last 24 hours: Temp:  [98.7 F (37.1 C)-101.9 F (38.8 C)] 101.9 F (38.8 C) (04/04 7510) Pulse Rate:  [105-120] 110 (04/04 0638) Resp:  [18-21] 18 (04/04 2585) BP: (78-116)/(47-80) 105/68 (04/04 0638) SpO2:  [84 %-100 %] 100 % (04/04 0638) FiO2 (%):  [50 %] 50 % (04/04 0615) Weight:  [124.4 kg (274 lb 4 oz)] 124.4 kg (274 lb 4 oz) (04/04 0415) Weight change: -0.9 kg (-1 lb 15.7 oz)  Intake/Output from previous day: 04/03 0701 - 04/04 0700 In: 2829.8 [I.V.:837.8; Blood:322; NG/GT:1670] Out: 2778  Intake/Output this shift: No intake/output data recorded.  General appearance: moderately obese, pale and not coop Resp: rales bilaterally and rhonchi bilaterally Cardio: S1, S2 normal and rate 110-120s, premature GI: soft, non-tender; bowel sounds normal; no masses,  no organomegaly and obese, pos bx Extremities: edema 3+.scarring on arms  Lab Results:  Recent Labs  04/07/16 0953  04/08/16 0400 04/08/16 0604  WBC 9.7  --  10.0  --   HGB 7.4*  < > 6.8* 7.8*  HCT 23.5*  < > 21.8* 23.0*  PLT 248  --  230  --   < > = values in this interval not displayed. BMET:  Recent Labs  04/07/16 1512  04/08/16 0400 04/08/16 0604  NA 134*  < > 134* 139  K 4.5  < > 4.0 4.0  CL 101  < > 100* 93*  CO2 27  --  27  --   GLUCOSE 131*  < > 194* 244*  BUN 38*  < > 39* 30*  CREATININE 2.82*  < > 2.77* 2.10*  CALCIUM 8.2*  --  8.4*  --   < > = values in this interval not displayed. No results for input(s): PTH in the last 72 hours. Iron Studies: No results for input(s): IRON, TIBC, TRANSFERRIN, FERRITIN in the last 72 hours.  Studies/Results: Dg Chest Port 1 View  Result Date: 04/08/2016 CLINICAL DATA:  Intubation. EXAM: PORTABLE CHEST 1 VIEW COMPARISON:  04/07/2016 . FINDINGS: Endotracheal tube, feeding tube, left IJ line in stable position . Heart size normal. Diffuse bilateral nodular pulmonary infiltrates are  again noted. No significant change. Improvement of left base atelectasis. No pleural effusion or pneumothorax. IMPRESSION: 1. Lines and tubes in stable position. 2. Diffuse bilateral pulmonary infiltrates are noted . These appear unchanged. Interim improvement of left base atelectasis . No pleural effusion noted on today's exam . Electronically Signed   By: Marcello Moores  Register   On: 04/08/2016 06:51   Dg Chest Port 1 View  Result Date: 04/07/2016 CLINICAL DATA:  Respiratory failure. EXAM: PORTABLE CHEST 1 VIEW COMPARISON:  04/06/2016. FINDINGS: Endotracheal tube, left IJ line, feeding tube in stable position. Stable cardiomegaly. Diffuse bilateral pulmonary infiltrates again noted without significant change. Left lower lobe atelectasis. Small left pleural effusion. IMPRESSION: 1. Lines and tubes in stable position. 2. Diffuse bilateral pulmonary infiltrates again noted without significant change. Left lower lobe atelectasis. Small left pleural effusion. Electronically Signed   By: Marcello Moores  Register   On: 04/07/2016 06:39    I have reviewed the patient's current medications.  Assessment/Plan: 1 AKI oliguric ATN,  Good solute/acid/base/K.  Now on Citrate. Will [place] 0Ca infusate. Cont slow vol off with lowish bps 2 Fever per CCM 3 VDRF 4Enceph per Neuro 5 Anemia no brisk bleedin 6 Substance abuse P citrate anticoag, vent, cultures, AB per CCM    LOS: 16 days   Jesse Hartman  L 04/08/2016,7:06 AM

## 2016-04-08 NOTE — Progress Notes (Signed)
CRITICAL VALUE ALERT  Critical value received:  Hgb 6.8  Date of notification:  04/08/16  Time of notification:  7096  Critical value read back:Yes.    Nurse who received alert:  Donald Siva RN  MD notified (1st page):  Resident Dr. Genene Churn  Time of first page:  0424  MD notified (2nd page):  Time of second page:  Responding MD:  Dr. Genene Churn  Time MD responded:  [phone]

## 2016-04-08 NOTE — Progress Notes (Signed)
hgb 6.8, tx 1 unit pRBC.

## 2016-04-09 ENCOUNTER — Inpatient Hospital Stay (HOSPITAL_COMMUNITY): Payer: Medicare Other

## 2016-04-09 DIAGNOSIS — Z515 Encounter for palliative care: Secondary | ICD-10-CM

## 2016-04-09 LAB — POCT I-STAT, CHEM 8
BUN: 25 mg/dL — ABNORMAL HIGH (ref 6–20)
BUN: 26 mg/dL — ABNORMAL HIGH (ref 6–20)
BUN: 22 mg/dL — ABNORMAL HIGH (ref 6–20)
BUN: 24 mg/dL — ABNORMAL HIGH (ref 6–20)
BUN: 25 mg/dL — ABNORMAL HIGH (ref 6–20)
BUN: 33 mg/dL — ABNORMAL HIGH (ref 6–20)
BUN: 34 mg/dL — ABNORMAL HIGH (ref 6–20)
CALCIUM ION: 0.49 mmol/L — AB (ref 1.15–1.40)
CALCIUM ION: 1.22 mmol/L (ref 1.15–1.40)
CHLORIDE: 89 mmol/L — AB (ref 101–111)
CREATININE: 1.8 mg/dL — AB (ref 0.61–1.24)
CREATININE: 1.8 mg/dL — AB (ref 0.61–1.24)
Calcium, Ion: 0.45 mmol/L — CL (ref 1.15–1.40)
Calcium, Ion: 0.47 mmol/L — CL (ref 1.15–1.40)
Calcium, Ion: 0.39 mmol/L — CL (ref 1.15–1.40)
Calcium, Ion: 0.82 mmol/L — CL (ref 1.15–1.40)
Calcium, Ion: 1.16 mmol/L (ref 1.15–1.40)
Chloride: 90 mmol/L — ABNORMAL LOW (ref 101–111)
Chloride: 91 mmol/L — ABNORMAL LOW (ref 101–111)
Chloride: 89 mmol/L — ABNORMAL LOW (ref 101–111)
Chloride: 91 mmol/L — ABNORMAL LOW (ref 101–111)
Chloride: 91 mmol/L — ABNORMAL LOW (ref 101–111)
Chloride: 93 mmol/L — ABNORMAL LOW (ref 101–111)
Creatinine, Ser: 1.8 mg/dL — ABNORMAL HIGH (ref 0.61–1.24)
Creatinine, Ser: 1.8 mg/dL — ABNORMAL HIGH (ref 0.61–1.24)
Creatinine, Ser: 2.8 mg/dL — ABNORMAL HIGH (ref 0.61–1.24)
Creatinine, Ser: 1.7 mg/dL — ABNORMAL HIGH (ref 0.61–1.24)
Creatinine, Ser: 2.8 mg/dL — ABNORMAL HIGH (ref 0.61–1.24)
GLUCOSE: 169 mg/dL — AB (ref 65–99)
GLUCOSE: 217 mg/dL — AB (ref 65–99)
GLUCOSE: 218 mg/dL — AB (ref 65–99)
Glucose, Bld: 223 mg/dL — ABNORMAL HIGH (ref 65–99)
Glucose, Bld: 158 mg/dL — ABNORMAL HIGH (ref 65–99)
Glucose, Bld: 168 mg/dL — ABNORMAL HIGH (ref 65–99)
Glucose, Bld: 235 mg/dL — ABNORMAL HIGH (ref 65–99)
HCT: 27 % — ABNORMAL LOW (ref 39.0–52.0)
HCT: 27 % — ABNORMAL LOW (ref 39.0–52.0)
HCT: 24 % — ABNORMAL LOW (ref 39.0–52.0)
HEMATOCRIT: 26 % — AB (ref 39.0–52.0)
HEMATOCRIT: 25 % — AB (ref 39.0–52.0)
HEMATOCRIT: 23 % — AB (ref 39.0–52.0)
HEMATOCRIT: 26 % — AB (ref 39.0–52.0)
HEMOGLOBIN: 7.8 g/dL — AB (ref 13.0–17.0)
HEMOGLOBIN: 8.5 g/dL — AB (ref 13.0–17.0)
HEMOGLOBIN: 8.8 g/dL — AB (ref 13.0–17.0)
HEMOGLOBIN: 8.8 g/dL — AB (ref 13.0–17.0)
HEMOGLOBIN: 9.2 g/dL — AB (ref 13.0–17.0)
HEMOGLOBIN: 9.2 g/dL — AB (ref 13.0–17.0)
Hemoglobin: 8.2 g/dL — ABNORMAL LOW (ref 13.0–17.0)
POTASSIUM: 4 mmol/L (ref 3.5–5.1)
Potassium: 3.7 mmol/L (ref 3.5–5.1)
Potassium: 3.7 mmol/L (ref 3.5–5.1)
Potassium: 3.7 mmol/L (ref 3.5–5.1)
Potassium: 3.7 mmol/L (ref 3.5–5.1)
Potassium: 3.8 mmol/L (ref 3.5–5.1)
Potassium: 3.9 mmol/L (ref 3.5–5.1)
SODIUM: 137 mmol/L (ref 135–145)
SODIUM: 137 mmol/L (ref 135–145)
SODIUM: 138 mmol/L (ref 135–145)
SODIUM: 139 mmol/L (ref 135–145)
SODIUM: 139 mmol/L (ref 135–145)
Sodium: 139 mmol/L (ref 135–145)
Sodium: 136 mmol/L (ref 135–145)
TCO2: 32 mmol/L (ref 0–100)
TCO2: 32 mmol/L (ref 0–100)
TCO2: 33 mmol/L (ref 0–100)
TCO2: 34 mmol/L (ref 0–100)
TCO2: 33 mmol/L (ref 0–100)
TCO2: 32 mmol/L (ref 0–100)
TCO2: 36 mmol/L (ref 0–100)

## 2016-04-09 LAB — BLOOD CULTURE ID PANEL (REFLEXED)
Acinetobacter baumannii: NOT DETECTED
CANDIDA ALBICANS: DETECTED — AB
CANDIDA GLABRATA: NOT DETECTED
CANDIDA KRUSEI: NOT DETECTED
CANDIDA TROPICALIS: NOT DETECTED
Candida parapsilosis: NOT DETECTED
ENTEROBACTER CLOACAE COMPLEX: NOT DETECTED
ENTEROBACTERIACEAE SPECIES: NOT DETECTED
ESCHERICHIA COLI: NOT DETECTED
Enterococcus species: NOT DETECTED
Haemophilus influenzae: NOT DETECTED
KLEBSIELLA OXYTOCA: NOT DETECTED
KLEBSIELLA PNEUMONIAE: NOT DETECTED
Listeria monocytogenes: NOT DETECTED
Methicillin resistance: DETECTED — AB
NEISSERIA MENINGITIDIS: NOT DETECTED
Proteus species: NOT DETECTED
Pseudomonas aeruginosa: NOT DETECTED
STREPTOCOCCUS PYOGENES: NOT DETECTED
Serratia marcescens: NOT DETECTED
Staphylococcus aureus (BCID): NOT DETECTED
Staphylococcus species: DETECTED — AB
Streptococcus agalactiae: NOT DETECTED
Streptococcus pneumoniae: NOT DETECTED
Streptococcus species: NOT DETECTED

## 2016-04-09 LAB — BPAM RBC
Blood Product Expiration Date: 201804142359
Blood Product Expiration Date: 201804172359
ISSUE DATE / TIME: 201804030617
ISSUE DATE / TIME: 201804040609
Unit Type and Rh: 6200
Unit Type and Rh: 6200

## 2016-04-09 LAB — TYPE AND SCREEN
ABO/RH(D): A POS
Antibody Screen: NEGATIVE
UNIT DIVISION: 0
UNIT DIVISION: 0

## 2016-04-09 LAB — CALCIUM, IONIZED: Calcium, Ionized, Serum: 5.1 mg/dL (ref 4.5–5.6)

## 2016-04-09 LAB — RENAL FUNCTION PANEL
ALBUMIN: 1.4 g/dL — AB (ref 3.5–5.0)
Anion gap: 9 (ref 5–15)
BUN: 33 mg/dL — ABNORMAL HIGH (ref 6–20)
CHLORIDE: 92 mmol/L — AB (ref 101–111)
CO2: 35 mmol/L — ABNORMAL HIGH (ref 22–32)
CREATININE: 2.68 mg/dL — AB (ref 0.61–1.24)
Calcium: 9 mg/dL (ref 8.9–10.3)
GFR, EST AFRICAN AMERICAN: 29 mL/min — AB (ref >60)
GFR, EST NON AFRICAN AMERICAN: 25 mL/min — AB (ref >60)
Glucose, Bld: 158 mg/dL — ABNORMAL HIGH (ref 65–99)
PHOSPHORUS: 3.1 mg/dL (ref 2.5–4.6)
Potassium: 3.7 mmol/L (ref 3.5–5.1)
Sodium: 136 mmol/L (ref 135–145)

## 2016-04-09 LAB — GLUCOSE, CAPILLARY
Glucose-Capillary: 146 mg/dL — ABNORMAL HIGH (ref 65–99)
Glucose-Capillary: 140 mg/dL — ABNORMAL HIGH (ref 65–99)
Glucose-Capillary: 156 mg/dL — ABNORMAL HIGH (ref 65–99)

## 2016-04-09 LAB — MAGNESIUM: Magnesium: 2 mg/dL (ref 1.7–2.4)

## 2016-04-09 LAB — APTT: APTT: 23 s — AB (ref 24–36)

## 2016-04-09 MED ORDER — SODIUM CHLORIDE 0.9 % IV SOLN
10.0000 mg/h | INTRAVENOUS | Status: DC
  Administered 2016-04-09: 10 mg/h via INTRAVENOUS
  Filled 2016-04-09: qty 10

## 2016-04-09 MED ORDER — MORPHINE BOLUS VIA INFUSION
5.0000 mg | INTRAVENOUS | Status: DC | PRN
  Administered 2016-04-09 (×2): 10 mg via INTRAVENOUS
  Filled 2016-04-09: qty 20

## 2016-04-09 MED ORDER — SODIUM CHLORIDE 0.9 % IV SOLN
INTRAVENOUS | Status: DC
  Administered 2016-04-09: 08:00:00 via INTRAVENOUS
  Filled 2016-04-09 (×4): qty 1000

## 2016-04-09 MED ORDER — HEPARIN SODIUM (PORCINE) 1000 UNIT/ML DIALYSIS
1000.0000 [IU] | INTRAMUSCULAR | Status: DC | PRN
  Administered 2016-04-09: 3200 [IU] via INTRAVENOUS_CENTRAL
  Filled 2016-04-09: qty 4

## 2016-04-10 LAB — CALCIUM, IONIZED: CALCIUM, IONIZED, SERUM: 5.1 mg/dL (ref 4.5–5.6)

## 2016-04-12 LAB — CULTURE, BLOOD (ROUTINE X 2): Special Requests: ADEQUATE

## 2016-04-13 ENCOUNTER — Telehealth: Payer: Self-pay

## 2016-04-13 NOTE — Telephone Encounter (Signed)
On 04/13/2016 I received a death certificate from [facility] (original). The death certificate is for cremation. The patient is a patient of Doctor Nelda Marseille. The death certificate will be taken to [facility] ([address]) this am for signature.  On 04-26-16 I received the death certificate back from Doctor Halford Chessman I got the death certificate ready and called the funeral home to let them know the death certificate is ready for pickup  I also faxed a copy to the funeral home per the funeral home request

## 2016-04-14 LAB — CULTURE, BLOOD (ROUTINE X 2)
Culture: NO GROWTH
SPECIAL REQUESTS: ADEQUATE

## 2016-05-05 NOTE — Progress Notes (Signed)
PHARMACY - PHYSICIAN COMMUNICATION CRITICAL VALUE ALERT - BLOOD CULTURE IDENTIFICATION (BCID)  Results for orders placed or performed during the hospital encounter of 03/08/2016  Blood Culture ID Panel (Reflexed) (Collected: 04/08/2016 11:47 AM)  Result Value Ref Range   Enterococcus species NOT DETECTED NOT DETECTED   Listeria monocytogenes NOT DETECTED NOT DETECTED   Staphylococcus species DETECTED (A) NOT DETECTED   Staphylococcus aureus NOT DETECTED NOT DETECTED   Methicillin resistance DETECTED (A) NOT DETECTED   Streptococcus species NOT DETECTED NOT DETECTED   Streptococcus agalactiae NOT DETECTED NOT DETECTED   Streptococcus pneumoniae NOT DETECTED NOT DETECTED   Streptococcus pyogenes NOT DETECTED NOT DETECTED   Acinetobacter baumannii NOT DETECTED NOT DETECTED   Enterobacteriaceae species NOT DETECTED NOT DETECTED   Enterobacter cloacae complex NOT DETECTED NOT DETECTED   Escherichia coli NOT DETECTED NOT DETECTED   Klebsiella oxytoca NOT DETECTED NOT DETECTED   Klebsiella pneumoniae NOT DETECTED NOT DETECTED   Proteus species NOT DETECTED NOT DETECTED   Serratia marcescens NOT DETECTED NOT DETECTED   Haemophilus influenzae NOT DETECTED NOT DETECTED   Neisseria meningitidis NOT DETECTED NOT DETECTED   Pseudomonas aeruginosa NOT DETECTED NOT DETECTED   Candida albicans DETECTED (A) NOT DETECTED   Candida glabrata NOT DETECTED NOT DETECTED   Candida krusei NOT DETECTED NOT DETECTED   Candida parapsilosis NOT DETECTED NOT DETECTED   Candida tropicalis NOT DETECTED NOT DETECTED    Name of physician (or Provider) Contacted: Dr. Hulen Luster  Changes to prescribed antibiotics required: none - patient has been transitioned to comfort care.   Carlean Jews, Pharm.D. PGY1 Pharmacy Resident [id number] PM Pager [phone]

## 2016-05-05 NOTE — Discharge Summary (Signed)
Jesse Hartman, HETTICH                  ACCOUNT NO.:  [id number]  MEDICAL RECORD NO.:  [id number]  LOCATION:  2M14C                        FACILITY:  [facility]  PHYSICIAN:  Providence Lanius, MD  DATE OF BIRTH:  [birth year]  DATE OF ADMISSION:  03/15/2016 DATE OF DISCHARGE:                              DISCHARGE SUMMARY   DEATH SUMMARY  PRIMARY DIAGNOSIS/CAUSE OF DEATH:  Acute respiratory failure with hypoxemia.  SECONDARY DIAGNOSES: 1. Chronic obstructive pulmonary disease exacerbation. 2. Obstructive sleep apnea. 3. Bilateral pneumonias. 4. Hemorrhagic shock. 5. Essential hypertension. 6. Decompensated systolic heart failure. 7. Acute renal failure. 8. Hepatic failure. 9. Alcohol abuse. 10.Gastrointestinal bleed with Barrett esophagus. 11.Acute pancreatitis. 12.Acute blood loss anemia secondary to gastrointestinal bleed. 13.Aspiration pneumonia. 14.Hyperglycemia. 44.RXVQM metabolic encephalopathy.  HOSPITAL COURSE:  The patient is a 56 year old male with past medical history significant for alcohol abuse, presented to the hospital with acute respiratory failure secondary to likely aspiration pneumonia.  The patient was admitted to intensive care unit on March 23, 2016, __________ full mechanical ventilation and during hospitalization developed acute kidney failure as well as acute on chronic hepatic failure with hepatic encephalopathy.  The patient was maintained on ventilator for well over 2 weeks, at which point, we had a conversation with family and decided the patient would not want a tracheostomy and a feeding tube placement in the nursing home, at which point, decision was made to transition the patient to full comfort care with do not resuscitate status.  The patient was stated DNR.  Morphine started.  The patient expired shortly thereafter.  Family at bedside.    Providence Lanius, MD    WJY/MEDQ  D:  04/16/2016  T:  04/16/2016  Job:  [id number]

## 2016-05-05 NOTE — Procedures (Signed)
Extubation Procedure Note  Patient Details:   Name: Jesse Hartman DOB: May 20, 1960 MRN: [id number]   Airway Documentation:     Evaluation  O2 sats: transiently fell during during procedure Complications: No apparent complications Patient did not tolerate procedure well. Bilateral Breath Sounds: Clear, Diminished   No  PT was terminally extubated to room air  Blackwell, Leonie Douglas 05-06-16, 1:51 PM

## 2016-05-05 NOTE — Progress Notes (Signed)
Pt previously on Morphine drip 1mg /ml; 125 ml of 250 ml bag wasted in sink;witnessed by West Carbo, RN

## 2016-05-05 NOTE — Progress Notes (Signed)
   Apr 27, 2016 1200  Clinical Encounter Type  Visited With Patient and family together;Health care provider  Visit Type Follow-up;Spiritual support;Critical Care  Referral From Chaplain;Nurse  Consult/Referral To Chaplain  Spiritual Encounters  Spiritual Needs Prayer;Emotional  Stress Factors  Patient Stress Factors Health changes  Family Stress Factors Exhausted;Family relationships;Health changes;Lack of knowledge;Loss    Chaplain followed up on [place] consult. Spent time with family and patient, patient is currently being prepped for extubation this afternoon. Family is experiencing crisis and trauma from mother's death as well. Daughter present and other daughter en route. Chaplain provided ministry of presence, hospitality, prayer, and emotional support. Mellony Danziger L. Volanda Napoleon, MDiv

## 2016-05-05 NOTE — Progress Notes (Signed)
CSW consult acknowledged for comfort care. CSW continues to provide emotional support to family regarding Patient's poor prognosis.    Lorrine Kin, MSW, LCSW [facility] ED/60M Clinical Social Worker [phone]

## 2016-05-05 NOTE — Progress Notes (Signed)
Pt asystolic in EKG x 2; no apical heart tones noted; no spontaneous respirations noted; pupils fixed and dilated; family at bedside; pt pronounced at 64; confirmed by West Carbo, RN; Dr Ronnette Juniper at [facility] notified.  Lenor Coffin, RN

## 2016-05-05 NOTE — Progress Notes (Signed)
PULMONARY / CRITICAL CARE MEDICINE   Name: VIYAN ROSAMOND MRN: [id number] DOB: 1960-01-29    ADMISSION DATE:  03/06/2016 CONSULTATION DATE:  03/06/2016  REFERRING MD:  Dr. Vanita Panda  CHIEF COMPLAINT:  AMS  HISTORY OF PRESENT ILLNESS:   56 year old male with PMH of ETOH abuse (24 pack per day), COPD, Barrett's esophagus, HTN, Seizures, and OSA. He was started on lasix and potassium supplementation approximately 3/12. He was found down by his daughter 3/19 and brought to ED via EMS,hypotensive, with AKI & hyperkalemia , cr 4.2  While in CT he had episode of projectile vomiting of coffee ground emesis. Despite IVF resuscitation he remained hypotensive . 3/19 PM cardiac arrest 6 mins, intubated. On pressors.   STUDIES:  CT head 3/19 > No evidence of acute intracranial abnormality. Generalized cerebral volume loss. Mild paranasal sinusitis of uncertain acuity . CT abd/pelv 3/19 > Stranding around the pancreas compatible with acute pancreatitis. Fatty infiltration of the liver. Distended, fluid-filled stomach. EGD 3/19 > Severe esophagitis with no acute hemorrhage CXR 4/4 > neg for pleural effusion, pulmonary infiltrates unchanged.  CXR 4/5 >  CULTURES: Blood 3/19 >> 1 /2  S viridans Urine 3/19 > insignificant growth Resp 3/21 >> abundant yeast, PMNs >> reincubated >>  Few yeast Blood 3/21 >> ng Urine 3/21 >> negative Blood cultures 4/4 >>  LINES/TUBES: CVL 3/19 > ETT 3/19 > L fem art line 3/21 >3/28   ANTIBIOTICS: vanco 3/19 >> 3/20 vanco 3/22 >> 3/23 Zosyn 3/19 >3/29 Cefepime>>3/30>3/31  SIGNIFICANT EVENTS: 3/19 admit, ARF 3/19 PM cardiac arrest 6 mins, intubated. On pressors.  3/23 Increased infiltrates over the last 24 hours on chest x-ray ? aspiration Changed to propofol on 3/24 3/25  significant cough that causes auto PEEP and subsequently hypotension 3/29 - Remains Critically ill, intubated & sedated. Continues to have asynchrony  on vent On high dose levo gtt.Good  UO  3/30 - los 11\ days. On levophed 75mg. fio2 70%, peep 5 and sedated. On bic gtt. Makes urine but rising creat. Per RN - was on CRRT for a week and has been off few to several days  04/04/16 - overnight brief cpr. On CRRT . Nimbex +. On 2 pressors. fio2 60%. Daughter Adrea at bedsdie = says dad might not want to go trhough all this and thinking terminal wean. She is agreeable to DNR as first step but needs to talk to elder sister/daugghter    SUBJECTIVE/OVERNIGHT/INTERVAL HX Still spiking fevers overnight. Opens eyes to voice.   VITAL SIGNS: BP 104/70   Pulse (!) 116   Temp 99.6 F (37.6 C) (Rectal)   Resp 16   Ht 6' (1.829 m)   Wt 122.6 kg (270 lb 4.5 oz)   SpO2 91%   BMI 36.66 kg/m   HEMODYNAMICS: CVP:  [6 mmHg] 6 mmHg  VENTILATOR SETTINGS: Vent Mode: PCV FiO2 (%):  [40 %] 40 % Set Rate:  [14 bmp] 14 bmp PEEP:  [5 cmH20] 5 cmH20 Plateau Pressure:  [24 cZHG99-24cmH20] 25 cmH20  INTAKE / OUTPUT: I/O last 3 completed shifts: In: 37619.8 [[Q.A.:83419 I.V.:2373.9; Blood:395; NG/GT:1555; IV Piggyback:248.8] Out: 7620 [Other:7620]  PHYSICAL EXAMINATION:  Gen: sedated, NAD, well nourished Lungs: CTAB, no wheezing Cardiac:RRR, no murmurs Abd: soft, active bowel sounds, non TTP Neuro: RASS -1 Skin: no open skin breaks, warm and dry, left central line non erythematous   LABS: PULMONARY  Recent Labs Lab 04/03/16 2305 04/04/16 0255 04/06/16 1628  04/08/16 0350  04/08/16 2117  07-May-2016 0408 05-07-16 0604 05-07-16 0609 2016-05-07 0803 May 07, 2016 0812  PHART 7.118* 7.366 7.298*  --  7.413  --  7.493*  --   --   --   --   --   --   PCO2ART ABOVE REPORTABLE RANGE 64.0* 59.4*  --  45.3  --  45.2  --   --   --   --   --   --   PO2ART 88.0 238* 65.0*  --  101  --  61.0*  --   --   --   --   --   --   HCO3 39.8* 35.7* 29.1*  --  27.8  --  34.5*  --   --   --   --   --   --   TCO2  --   --  31  < >  --   < > 36  < > 32 34 33 33 36  O2SAT 95.4 99.6 90.0  --  97.4  --  92.0   --   --   --   --   --   --   < > = values in this interval not displayed. CBC  Recent Labs Lab 04/07/16 0953  04/08/16 0400  04/08/16 0945  05-07-16 0609 May 07, 2016 0803 2016-05-07 0812  HGB 7.4*  < > 6.8*  < > 7.3*  < > 8.5* 8.8* 7.8*  HCT 23.5*  < > 21.8*  < > 23.5*  < > 25.0* 26.0* 23.0*  WBC 9.7  --  10.0  --  10.8*  --   --   --   --   PLT 248  --  230  --  235  --   --   --   --   < > = values in this interval not displayed.  COAGULATION  Recent Labs Lab 04/02/16 1412  INR 1.08   CHEMISTRY  Recent Labs Lab 04/05/16 0435  04/06/16 0426  04/07/16 0428 04/07/16 1512  04/08/16 0400  04/08/16 1600  07-May-2016 0400  07-May-2016 0500 2016/05/07 0604 May 07, 2016 0609 2016/05/07 0803 05/07/16 0812  NA 137  < > 137  < > 133* 134*  < > 134*  < > 137  < >  --   < > 136 137 138 139 136  K 4.0  < > 4.5  < > 4.4 4.5  < > 4.0  < > 3.8  < >  --   < > 3.7 3.9 3.8 3.7 4.0  CL 95*  < > 98*  < > 100* 101  < > 100*  < > 95*  < >  --   < > 92* 89* 93* 89* 91*  CO2 32  < > 31  < > 27 27  --  27  --  32  --   --   --  35*  --   --   --   --   GLUCOSE 152*  < > 139*  < > 125* 131*  < > 194*  < > 167*  < >  --   < > 158* 158* 168* 235* 169*  BUN 41*  < > 38*  < > 38* 38*  < > 39*  < > 39*  < >  --   < > 33* 34* 24* 22* 33*  CREATININE 2.94*  < > 2.73*  < > 2.93* 2.82*  < > 2.77*  < > 2.94*  < >  --   < >  2.68* 2.80* 1.80* 1.70* 2.80*  CALCIUM 8.0*  < > 8.4*  < > 8.2* 8.2*  --  8.4*  --  8.8*  --   --   --  9.0  --   --   --   --   MG 2.4  --  2.5*  --  2.4  --   --  2.4  --   --   --  2.0  --   --   --   --   --   --   PHOS 4.9*  4.9*  < > 4.4  < > 2.9  3.0 2.9  --  2.1*  --  4.3  --   --   --  3.1  --   --   --   --   < > = values in this interval not displayed. Estimated Creatinine Clearance: 40.8 mL/min (A) (by C-G formula based on SCr of 2.8 mg/dL (H)).  LIVER  Recent Labs Lab 04/02/16 1412  04/07/16 0428 04/07/16 1512 04/08/16 0400 04/08/16 1600 04/29/2016 0500  ALBUMIN  --   < >  1.4* 1.4* 1.3* 1.4* 1.4*  INR 1.08  --   --   --   --   --   --   < > = values in this interval not displayed.  INFECTIOUS No results for input(s): LATICACIDVEN, PROCALCITON in the last 168 hours.  ENDOCRINE CBG (last 3)   Recent Labs  04/08/16 2318 04-29-2016 0313 2016/04/29 0813  GLUCAP 146* 140* 156*   IMAGING x48h  - image(s) personally visualized  -   highlighted in bold Dg Chest Port 1 View  Result Date: 04/29/2016 CLINICAL DATA:  ARDS - vented with CVL. Vital stats has been good. Bilateral leg and arm swelling. Coughing over vent. EXAM: PORTABLE CHEST 1 VIEW COMPARISON:  04/08/2016 FINDINGS: Endotracheal tube is in place, tip approximately 8.8 cm above the carina. Left IJ central line tip overlies the level of the left brachiocephalic vein. Heart size is exaggerated by the portable AP technique. There are patchy airspace opacities bilaterally demonstrating little change over prior studies accounting for differences in technique and patient position. IMPRESSION: Persistent airspace opacities. Electronically Signed   By: Nolon Nations M.D.   On: 2016-04-29 08:45   Dg Chest Port 1 View  Result Date: 04/08/2016 CLINICAL DATA:  Intubation. EXAM: PORTABLE CHEST 1 VIEW COMPARISON:  04/07/2016 . FINDINGS: Endotracheal tube, feeding tube, left IJ line in stable position . Heart size normal. Diffuse bilateral nodular pulmonary infiltrates are again noted. No significant change. Improvement of left base atelectasis. No pleural effusion or pneumothorax. IMPRESSION: 1. Lines and tubes in stable position. 2. Diffuse bilateral pulmonary infiltrates are noted . These appear unchanged. Interim improvement of left base atelectasis . No pleural effusion noted on today's exam . Electronically Signed   By: Marcello Moores  Register   On: 04/08/2016 06:51   Dg Abd Portable 1v  Result Date: 04/08/2016 CLINICAL DATA:  Assess esophagogastric tube placement. EXAM: PORTABLE ABDOMEN - 1 VIEW COMPARISON:  KUB of March 31, 2016 FINDINGS: The tip of the esophagogastric tube overlies the pre-pyloric region of the stomach. Advancement of this feeding tube is needed prior to feeding. IMPRESSION: The tip of the feeding tube lies in the pre-pyloric region of the stomach. Electronically Signed   By: Gianluca  Martinique M.D.   On: 04/08/2016 12:34   DISCUSSION: 56 year old male admitted with AMS and AKI. Was improving but suffered massive coffee ground  emesis and subsequent brief cardiac arrest. Developed ARDS & shock. Mental status intact but hypoxia very slow to improve  ASSESSMENT / PLAN:  PULMONARY A: Acute hypoxemic respiratory failure: suspect he aspirated on emesis, CXR and exam also concerning ALI vs edema. COPD without acute exacerbation. OSA. BL PNA -favor Aspiration  - 4/2 ARDS with 60% fio2, peep 8 and CXR unchanged from yesterday  P:   PCV vent, continue full vent support. Scheduled bronchodilators. Off nimbex. Family meeting in AM for comfort care.  CARDIOVASCULAR A:  Shock: hemorrhagic, +/- septic  H/o HTN, CHF. -septic shock resolved  04/05/16  P:  MAP goal > 65 Goal CVP 10-12 range  RENAL A:   Acute renal failure - presumed ATN in setting dehydration/ARB/NSAIDs/shock. STrated CRRT 04/03/16 4/1/1 - on CRRT. ANuric  P:   Per renal CRRT negative  GASTROINTESTINAL A:   GIB - severe esophagitis on EGD. No acute bleed so no interventions performed. Acute pancreatitis - per CT but lipase / amylase normal, suspect more chronic . H/o Barrett's esophagus. Constipation P:   Tube feeding Protonix per tube daily hgb stable, no reports of bloody or dark BMs overnight  HEMATOLOGIC A:   ABLA secondary to GIB, some continued blood loss, no hematemesis, no melena yet; anemia of critical illness P:  Follow CBC Transfusion goal Hgb > 7  INFECTIOUS A:   Aspiration PNA, LLL Yeast resp cx, unclear significance  S. Viridans 1 of 2 blood cx, repeat negative so far   -off pressors 04/05/16   P:    Continue Zosyn until 3/29, then ancef to complete 14 dc if resp improvement, ancef stopped 4/1 for total of 11 days abx. Vancomycin stopped on 3/23 Still spiking fevers, follow blood cultures from 4/4  ENDOCRINE A:   Hyperglycemia. P:   SSI  NEUROLOGIC A:   Acute metabolic encephalopathy: multifactorial - withdrawal, uremia, hepatic, sedation. At risk ETOH/benzo withdrawal.  -04/05/16  -deeply sedated and paraluyzed  P   Fent gtt and versed gtt Off nimbex Off  Klonopin Off  diprivan Off  deroquel EEG 4/3 >>severe diffuse cerebral dysfunction that is non-specific in etiology and can be seen in the setting of anoxic/ischemic injury, toxic/metabolic encephalopathies, or medication effect. If clinically indicated, consider repeating study with patient completely off of sedating medications. Neurology consulted, as pt is following commands need to clinically correlate.   FAMILY  - Updates:  RN contacted family, family meeting at 10 AM 4/5.  - Inter-disciplinary family meet or Palliative Care meeting due by: today  Gara Kroner, MD Internal Medicine Resident, PGY [facility] Medicine Program Pager: [phone]  Attending Note:  56 year old male with extensive PMH who presents to the hospital with respiratory, renal, hepatic and heart failure.  Patient has been intubated for 17 days.  On exam, he is arousable but not following commands with coarse BS.  I reviewed CXR myself, ETT in good position.  Met with sister and daughter.  After discussion, decision was made to make patient a full DNR and proceed with morphine and withdrawal of care today upon arrival of the rest of the family.  The patient is critically ill with multiple organ systems failure and requires high complexity decision making for assessment and support, frequent evaluation and titration of therapies, application of advanced monitoring technologies and extensive interpretation of multiple databases.    Critical Care Time devoted to patient care services described in this note is  35  Minutes. This time reflects time  of care of this signee Dr Jennet Maduro. This critical care time does not reflect procedure time, or teaching time or supervisory time of PA/NP/Med student/Med Resident etc but could involve care discussion time.  Rush Farmer, M.D. [facility] Pulmonary/Critical Care Medicine. Pager: [phone]. After hours pager: [phone].  2016/04/19 11:18 AM

## 2016-05-05 NOTE — Progress Notes (Signed)
PULMONARY / CRITICAL CARE MEDICINE   Name: Jesse Hartman MRN: [id number] DOB: February 09, 1960    ADMISSION DATE:  03/30/2016 CONSULTATION DATE:  03/25/2016  REFERRING MD:  Dr. Vanita Panda  CHIEF COMPLAINT:  AMS  HISTORY OF PRESENT ILLNESS:   57 year old male with PMH of ETOH abuse (24 pack per day), COPD, Barrett's esophagus, HTN, Seizures, and OSA. He was started on lasix and potassium supplementation approximately 3/12. He was found down by his daughter 3/19 and brought to ED via EMS,hypotensive, with AKI & hyperkalemia , cr 4.2  While in CT he had episode of projectile vomiting of coffee ground emesis. Despite IVF resuscitation he remained hypotensive . 3/19 PM cardiac arrest 6 mins, intubated. On pressors.   STUDIES:  CT head 3/19 > No evidence of acute intracranial abnormality. Generalized cerebral volume loss. Mild paranasal sinusitis of uncertain acuity . CT abd/pelv 3/19 > Stranding around the pancreas compatible with acute pancreatitis. Fatty infiltration of the liver. Distended, fluid-filled stomach. EGD 3/19 > Severe esophagitis with no acute hemorrhage CXR 4/4 > neg for pleural effusion, pulmonary infiltrates unchanged.  CXR 4/5 >  CULTURES: Blood 3/19 >> 1 /2  S viridans Urine 3/19 > insignificant growth Resp 3/21 >> abundant yeast, PMNs >> reincubated >>  Few yeast Blood 3/21 >> ng Urine 3/21 >> negative Blood cultures 4/4 >>  LINES/TUBES: CVL 3/19 > ETT 3/19 > L fem art line 3/21 >3/28   ANTIBIOTICS: vanco 3/19 >> 3/20 vanco 3/22 >> 3/23 Zosyn 3/19 >3/29 Cefepime>>3/30>3/31  SIGNIFICANT EVENTS: 3/19 admit, ARF 3/19 PM cardiac arrest 6 mins, intubated. On pressors.  3/23 Increased infiltrates over the last 24 hours on chest x-ray ? aspiration Changed to propofol on 3/24 3/25  significant cough that causes auto PEEP and subsequently hypotension 3/29 - Remains Critically ill, intubated & sedated. Continues to have asynchrony  on vent On high dose levo gtt.Good  UO  3/30 - los 11\ days. On levophed 61mcg. fio2 70%, peep 5 and sedated. On bic gtt. Makes urine but rising creat. Per RN - was on CRRT for a week and has been off few to several days  04/04/16 - overnight brief cpr. On CRRT . Nimbex +. On 2 pressors. fio2 60%. Daughter Adrea at bedsdie = says dad might not want to go trhough all this and thinking terminal wean. She is agreeable to DNR as first step but needs to talk to elder sister/daugghter    SUBJECTIVE/OVERNIGHT/INTERVAL HX Still spiking fevers overnight. Opens eyes to voice.   VITAL SIGNS: BP 97/61   Pulse (!) 107   Temp 99.6 F (37.6 C) (Rectal)   Resp 18   Ht 6' (1.829 m)   Wt 270 lb 4.5 oz (122.6 kg)   SpO2 95%   BMI 36.66 kg/m   HEMODYNAMICS: CVP:  [6 mmHg] 6 mmHg  VENTILATOR SETTINGS: Vent Mode: PCV FiO2 (%):  [40 %] 40 % Set Rate:  [14 bmp] 14 bmp PEEP:  [5 cmH20] 5 cmH20 Plateau Pressure:  [24 LEX51-70 cmH20] 26 cmH20  INTAKE / OUTPUT: I/O last 3 completed shifts: In: 37619.8 [Y.F.:74944; I.V.:2373.9; Blood:395; NG/GT:1555; IV Piggyback:248.8] Out: 7620 [Other:7620]  PHYSICAL EXAMINATION:  Gen: sedated, NAD, well nourished Lungs: CTAB, no wheezing Cardiac:RRR, no murmurs Abd: soft, active bowel sounds, non TTP Neuro: RASS -1 Skin: no open skin breaks, warm and dry, left central line non erythematous   LABS: PULMONARY  Recent Labs Lab 04/03/16 2305 04/04/16 0255 04/06/16 1628  04/08/16 0350  04/08/16 2117  04/08/16 2157 04/08/16 2208 2016/04/29 0009 29-Apr-2016 0206  PHART 7.118* 7.366 7.298*  --  7.413  --  7.493*  --   --   --   --   PCO2ART ABOVE REPORTABLE RANGE 64.0* 59.4*  --  45.3  --  45.2  --   --   --   --   PO2ART 88.0 238* 65.0*  --  101  --  61.0*  --   --   --   --   HCO3 39.8* 35.7* 29.1*  --  27.8  --  34.5*  --   --   --   --   TCO2  --   --  31  < >  --   < > 36 33 34 32 32  O2SAT 95.4 99.6 90.0  --  97.4  --  92.0  --   --   --   --   < > = values in this interval not  displayed. CBC  Recent Labs Lab 04/07/16 0953  04/08/16 0400  04/08/16 0945  04/08/16 2208 04/29/16 0009 04-29-2016 0206  HGB 7.4*  < > 6.8*  < > 7.3*  < > 8.5* 9.2* 9.2*  HCT 23.5*  < > 21.8*  < > 23.5*  < > 25.0* 27.0* 27.0*  WBC 9.7  --  10.0  --  10.8*  --   --   --   --   PLT 248  --  230  --  235  --   --   --   --   < > = values in this interval not displayed.  COAGULATION  Recent Labs Lab 04/02/16 1412  INR 1.08   CHEMISTRY  Recent Labs Lab 04/05/16 0435  04/06/16 0426  04/07/16 0428 04/07/16 1512  04/08/16 0400  04/08/16 1600  04/08/16 2157 04/08/16 2208 29-Apr-2016 0009 29-Apr-2016 0206 Apr 29, 2016 0400 04-29-2016 0500  NA 137  < > 137  < > 133* 134*  < > 134*  < > 137  < > 139 138 139 139  --  136  K 4.0  < > 4.5  < > 4.4 4.5  < > 4.0  < > 3.8  < > 3.7 3.9 3.7 3.7  --  3.7  CL 95*  < > 98*  < > 100* 101  < > 100*  < > 95*  < > 91* 93* 91* 90*  --  92*  CO2 32  < > 31  < > 27 27  --  27  --  32  --   --   --   --   --   --  35*  GLUCOSE 152*  < > 139*  < > 125* 131*  < > 194*  < > 167*  < > 217* 165* 217* 218*  --  158*  BUN 41*  < > 38*  < > 38* 38*  < > 39*  < > 39*  < > 28* 37* 26* 25*  --  33*  CREATININE 2.94*  < > 2.73*  < > 2.93* 2.82*  < > 2.77*  < > 2.94*  < > 2.10* 3.10* 1.80* 1.80*  --  2.68*  CALCIUM 8.0*  < > 8.4*  < > 8.2* 8.2*  --  8.4*  --  8.8*  --   --   --   --   --   --  9.0  MG 2.4  --  2.5*  --  2.4  --   --  2.4  --   --   --   --   --   --   --  2.0  --   PHOS 4.9*  4.9*  < > 4.4  < > 2.9  3.0 2.9  --  2.1*  --  4.3  --   --   --   --   --   --  3.1  < > = values in this interval not displayed. Estimated Creatinine Clearance: 42.6 mL/min (A) (by C-G formula based on SCr of 2.68 mg/dL (H)).  LIVER  Recent Labs Lab 04/02/16 1412  04/07/16 0428 04/07/16 1512 04/08/16 0400 04/08/16 1600 05-05-16 0500  ALBUMIN  --   < > 1.4* 1.4* 1.3* 1.4* 1.4*  INR 1.08  --   --   --   --   --   --   < > = values in this interval not  displayed.  INFECTIOUS No results for input(s): LATICACIDVEN, PROCALCITON in the last 168 hours.  ENDOCRINE CBG (last 3)   Recent Labs  04/08/16 1921 04/08/16 2318 05/05/16 0313  GLUCAP 170* 146* 140*   IMAGING x48h  - image(s) personally visualized  -   highlighted in bold Dg Chest Port 1 View  Result Date: 04/08/2016 CLINICAL DATA:  Intubation. EXAM: PORTABLE CHEST 1 VIEW COMPARISON:  04/07/2016 . FINDINGS: Endotracheal tube, feeding tube, left IJ line in stable position . Heart size normal. Diffuse bilateral nodular pulmonary infiltrates are again noted. No significant change. Improvement of left base atelectasis. No pleural effusion or pneumothorax. IMPRESSION: 1. Lines and tubes in stable position. 2. Diffuse bilateral pulmonary infiltrates are noted . These appear unchanged. Interim improvement of left base atelectasis . No pleural effusion noted on today's exam . Electronically Signed   By: Marcello Moores  Register   On: 04/08/2016 06:51   Dg Abd Portable 1v  Result Date: 04/08/2016 CLINICAL DATA:  Assess esophagogastric tube placement. EXAM: PORTABLE ABDOMEN - 1 VIEW COMPARISON:  KUB of March 31, 2016 FINDINGS: The tip of the esophagogastric tube overlies the pre-pyloric region of the stomach. Advancement of this feeding tube is needed prior to feeding. IMPRESSION: The tip of the feeding tube lies in the pre-pyloric region of the stomach. Electronically Signed   By: Josejuan  Martinique M.D.   On: 04/08/2016 12:34   DISCUSSION: 56 year old male admitted with AMS and AKI. Was improving but suffered massive coffee ground emesis and subsequent brief cardiac arrest. Developed ARDS & shock. Mental status intact but hypoxia very slow to improve  ASSESSMENT / PLAN:  PULMONARY A: Acute hypoxemic respiratory failure: suspect he aspirated on emesis, CXR and exam also concerning ALI vs edema. COPD without acute exacerbation. OSA. BL PNA -favor Aspiration  - 4/2 ARDS with 60% fio2, peep 8 and CXR  unchanged from yesterday  P:   PCV vent, continue full vent support. Scheduled bronchodilators. Off nimbex. Family meeting in AM for comfort care.  CARDIOVASCULAR A:  Shock: hemorrhagic, +/- septic  H/o HTN, CHF. -septic shock resolved  04/05/16  P:  MAP goal > 65 Goal CVP 10-12 range  RENAL A:   Acute renal failure - presumed ATN in setting dehydration/ARB/NSAIDs/shock. STrated CRRT 04/03/16 4/1/1 - on CRRT. ANuric  P:   Per renal CRRT negative  GASTROINTESTINAL A:   GIB - severe esophagitis on EGD. No acute bleed so no interventions performed. Acute pancreatitis - per CT but lipase / amylase normal, suspect more chronic .  H/o Barrett's esophagus. Constipation P:   Tube feeding Protonix per tube daily hgb stable, no reports of bloody or dark BMs overnight  HEMATOLOGIC A:   ABLA secondary to GIB, some continued blood loss, no hematemesis, no melena yet; anemia of critical illness P:  Follow CBC Transfusion goal Hgb > 7  INFECTIOUS A:   Aspiration PNA, LLL Yeast resp cx, unclear significance  S. Viridans 1 of 2 blood cx, repeat negative so far   -off pressors 04/05/16   P:   Continue Zosyn until 3/29, then ancef to complete 14 dc if resp improvement, ancef stopped 4/1 for total of 11 days abx. Vancomycin stopped on 3/23 Still spiking fevers, follow blood cultures from 4/4  ENDOCRINE A:   Hyperglycemia. P:   SSI  NEUROLOGIC A:   Acute metabolic encephalopathy: multifactorial - withdrawal, uremia, hepatic, sedation. At risk ETOH/benzo withdrawal.  -04/05/16  -deeply sedated and paraluyzed  P   Fent gtt and versed gtt Off nimbex Off  Klonopin Off  diprivan Off  deroquel EEG 4/3 >>severe diffuse cerebral dysfunction that is non-specific in etiology and can be seen in the setting of anoxic/ischemic injury, toxic/metabolic encephalopathies, or medication effect. If clinically indicated, consider repeating study with patient completely off of sedating  medications. Neurology consulted, as pt is following commands need to clinically correlate.   FAMILY  - Updates:  RN contacted family, family meeting at 13 AM 4/5.  - Inter-disciplinary family meet or Palliative Care meeting due by: today  Julious Oka, MD Internal Medicine Resident, PGY [facility] Pager: [phone]   2016-04-24 8:13 AM

## 2016-05-05 NOTE — Progress Notes (Signed)
Subjective: Interval History: has no complaint , on vent ,not responsive.  Objective: Vital signs in last 24 hours: Temp:  [99.3 F (37.4 C)-102.4 F (39.1 C)] 100.2 F (37.9 C) (04/05 0311) Pulse Rate:  [101-125] 107 (04/05 0700) Resp:  [12-83] 18 (04/05 0700) BP: (87-138)/(55-90) 97/61 (04/05 0700) SpO2:  [89 %-100 %] 95 % (04/05 0700) FiO2 (%):  [40 %-50 %] 40 % (04/05 0434) Weight:  [122.6 kg (270 lb 4.5 oz)] 122.6 kg (270 lb 4.5 oz) (04/05 0415) Weight change: -1.8 kg (-3 lb 15.5 oz)  Intake/Output from previous day: 04/04 0701 - 04/05 0700 In: 39767 [P.O.:33047; I.V.:1745.2; Blood:365; NG/GT:485; IV Piggyback:248.8] Out: 5580  Intake/Output this shift: No intake/output data recorded.  General appearance: moderately obese and pale Resp: rales bibasilar and rhonchi bibasilar Cardio: S1, S2 normal and systolic murmur: holosystolic 2/6, blowing at apex GI: pos bs, soft Extremities: edema 2+ and skin lesions on arms  Lab Results:  Recent Labs  04/08/16 0400  04/08/16 0945  04-May-2016 0009 2016/05/04 0206  WBC 10.0  --  10.8*  --   --   --   HGB 6.8*  < > 7.3*  < > 9.2* 9.2*  HCT 21.8*  < > 23.5*  < > 27.0* 27.0*  PLT 230  --  235  --   --   --   < > = values in this interval not displayed. BMET:  Recent Labs  04/08/16 1600  05-04-2016 0206 05/04/2016 0500  NA 137  < > 139 136  K 3.8  < > 3.7 3.7  CL 95*  < > 90* 92*  CO2 32  --   --  35*  GLUCOSE 167*  < > 218* 158*  BUN 39*  < > 25* 33*  CREATININE 2.94*  < > 1.80* 2.68*  CALCIUM 8.8*  --   --  9.0  < > = values in this interval not displayed. No results for input(s): PTH in the last 72 hours. Iron Studies: No results for input(s): IRON, TIBC, TRANSFERRIN, FERRITIN in the last 72 hours.  Studies/Results: Dg Chest Port 1 View  Result Date: 04/08/2016 CLINICAL DATA:  Intubation. EXAM: PORTABLE CHEST 1 VIEW COMPARISON:  04/07/2016 . FINDINGS: Endotracheal tube, feeding tube, left IJ line in stable position .  Heart size normal. Diffuse bilateral nodular pulmonary infiltrates are again noted. No significant change. Improvement of left base atelectasis. No pleural effusion or pneumothorax. IMPRESSION: 1. Lines and tubes in stable position. 2. Diffuse bilateral pulmonary infiltrates are noted . These appear unchanged. Interim improvement of left base atelectasis . No pleural effusion noted on today's exam . Electronically Signed   By: Marcello Moores  Register   On: 04/08/2016 06:51   Dg Abd Portable 1v  Result Date: 04/08/2016 CLINICAL DATA:  Assess esophagogastric tube placement. EXAM: PORTABLE ABDOMEN - 1 VIEW COMPARISON:  KUB of March 31, 2016 FINDINGS: The tip of the esophagogastric tube overlies the pre-pyloric region of the stomach. Advancement of this feeding tube is needed prior to feeding. IMPRESSION: The tip of the feeding tube lies in the pre-pyloric region of the stomach. Electronically Signed   By: Wilson  [place] M.D.   On: 04/08/2016 12:34    I have reviewed the patient's current medications.  Assessment/Plan: 1 AKI oliguric ATN.  Solute ok.  Getting alkalemic with citrate, use NS RF.  K ok. Vol better 2 anemia stable 3 VDRF per CCM 4 Enceph  5 Nutrition TF on hold 6 Obesity 7 Substance  abuse 8 COPD P use NS RF, family meeting, cont net nef    LOS: 17 days   Alson Mcpheeters L 04-25-2016,7:24 AM

## 2016-05-05 DEATH — deceased

## 2019-01-02 IMAGING — CR DG CHEST 1V PORT
1 series · 1 of 1 positions shown · non-contrast
Comparison: 04/06/2016.

CLINICAL DATA: Respiratory failure.

EXAM:
PORTABLE CHEST 1 VIEW

[AP]
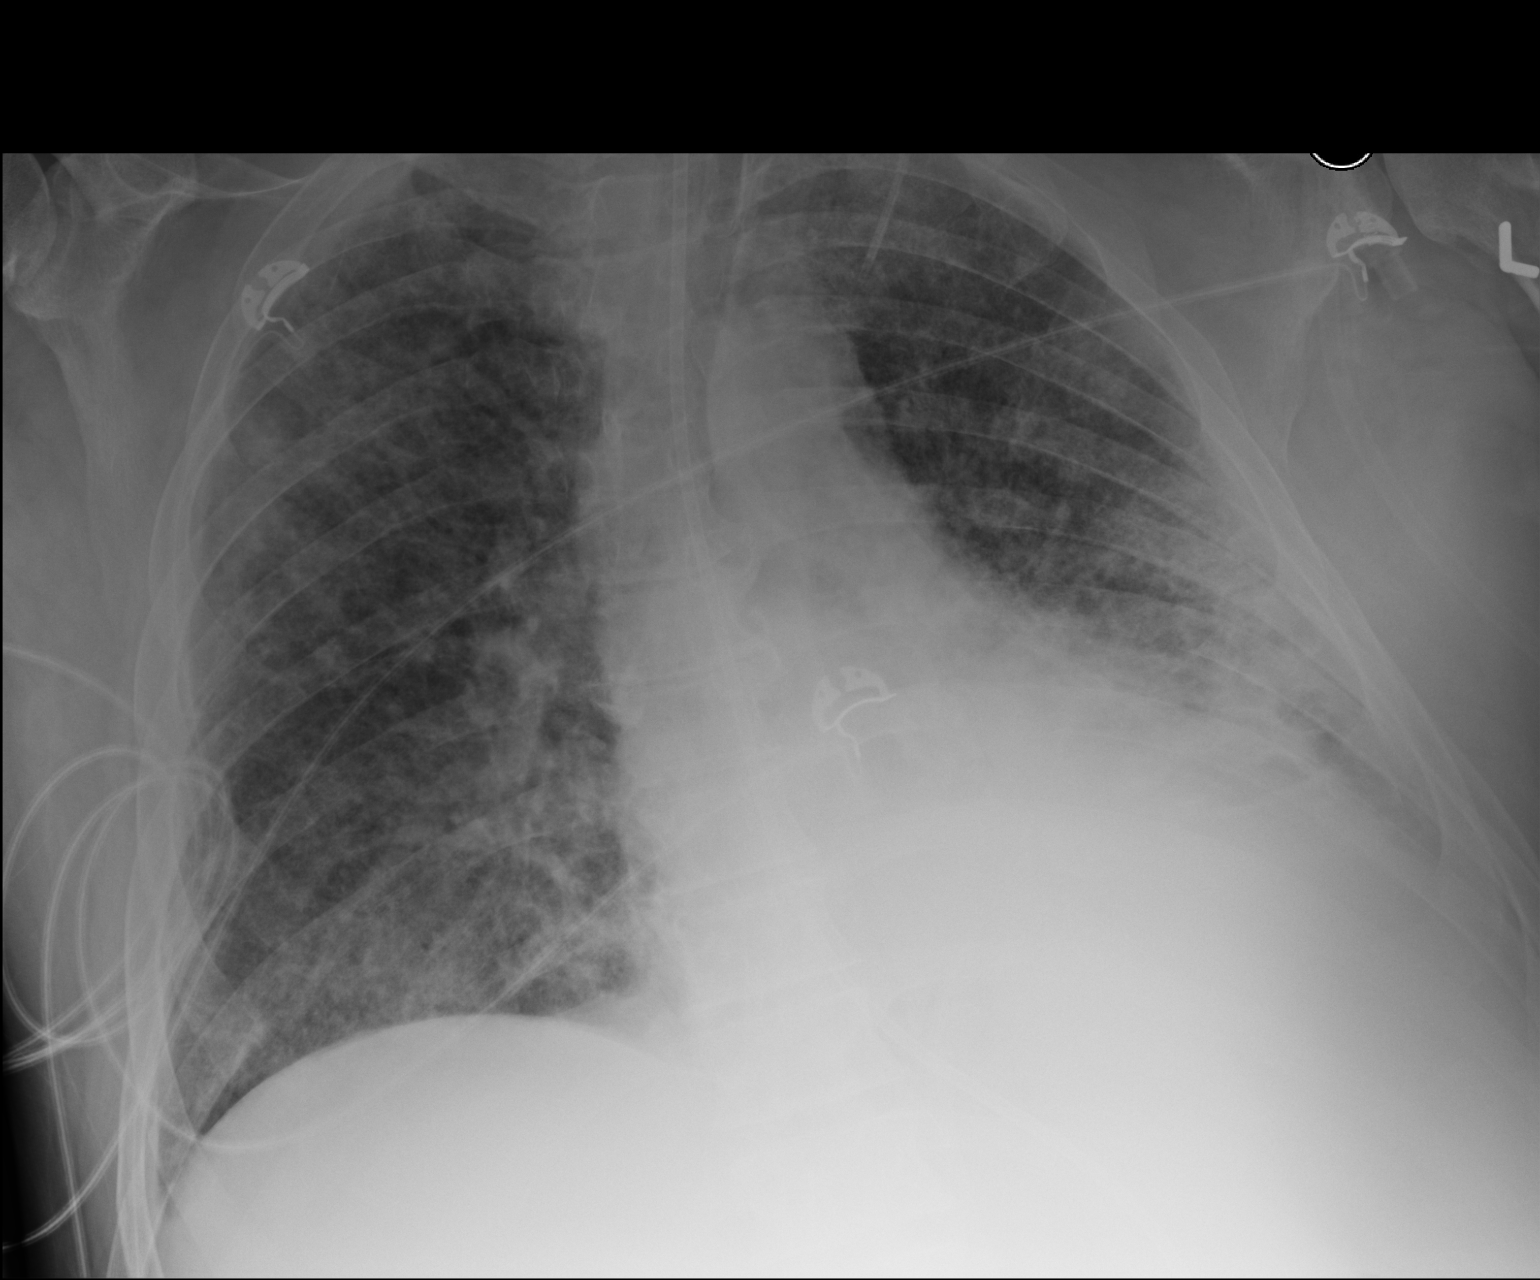

[1 of 1 positions shown; findings below may reference images not displayed]

FINDINGS: Endotracheal tube, left IJ line, feeding tube in stable position.
Stable cardiomegaly. Diffuse bilateral pulmonary infiltrates again
noted without significant change. Left lower lobe atelectasis. Small
left pleural effusion.
IMPRESSION: 1. Lines and tubes in stable position.

2. Diffuse bilateral pulmonary infiltrates again noted without
significant change. Left lower lobe atelectasis. Small left pleural
effusion.
